# Patient Record
Sex: Male | Born: 1955 | Race: White | Hispanic: No | Marital: Married | Smoking: Current some day smoker
Health system: Southern US, Community
[De-identification: ages and names within clinical notes are randomized; demographics above are authoritative.]

## PROBLEM LIST (undated history)

## (undated) DIAGNOSIS — Z973 Presence of spectacles and contact lenses: Secondary | ICD-10-CM

## (undated) DIAGNOSIS — I671 Cerebral aneurysm, nonruptured: Secondary | ICD-10-CM

## (undated) DIAGNOSIS — H532 Diplopia: Secondary | ICD-10-CM

## (undated) DIAGNOSIS — J302 Other seasonal allergic rhinitis: Secondary | ICD-10-CM

## (undated) DIAGNOSIS — I639 Cerebral infarction, unspecified: Secondary | ICD-10-CM

## (undated) HISTORY — DX: Diplopia: H53.2

## (undated) HISTORY — PX: WISDOM TOOTH EXTRACTION: SHX21

## (undated) HISTORY — PX: COLONOSCOPY: SHX174

## (undated) HISTORY — PX: BACK SURGERY: SHX140

## (undated) HISTORY — DX: Cerebral infarction, unspecified: I63.9

---

## 2013-06-13 ENCOUNTER — Other Ambulatory Visit: Payer: Self-pay | Admitting: Family Medicine

## 2013-06-13 DIAGNOSIS — M545 Low back pain, unspecified: Secondary | ICD-10-CM

## 2013-06-17 ENCOUNTER — Ambulatory Visit
Admission: RE | Admit: 2013-06-17 | Discharge: 2013-06-17 | Disposition: A | Discharge status: Home / Self Care | Payer: BC Managed Care – PPO | Source: Ambulatory Visit | Attending: Family Medicine | Admitting: Family Medicine

## 2013-06-17 DIAGNOSIS — M545 Low back pain, unspecified: Secondary | ICD-10-CM

## 2015-10-09 DIAGNOSIS — F17210 Nicotine dependence, cigarettes, uncomplicated: Secondary | ICD-10-CM | POA: Insufficient documentation

## 2015-10-09 DIAGNOSIS — H9012 Conductive hearing loss, unilateral, left ear, with unrestricted hearing on the contralateral side: Secondary | ICD-10-CM | POA: Insufficient documentation

## 2015-10-09 DIAGNOSIS — H6123 Impacted cerumen, bilateral: Secondary | ICD-10-CM | POA: Insufficient documentation

## 2019-06-07 NOTE — Progress Notes (Signed)
NEUROLOGY CONSULTATION NOTE  Victor Pena MRN: [id number] DOB: 08-23-1955  Referring provider: Ashok Pena, OD Primary care provider: No PCP  Reason for consult:  diplopia  HISTORY OF PRESENT ILLNESS: Victor Pena is a 64 year old right-handed male who presents for intermittent diplopia.  History supplemented by referring provider note.  Since early February, he will have episodes where his left eye drifts temporally, lasting 1 to 2 seconds and occurring 3 to 4 times a day. He particularly notices it while looking in the mirror.  He notes some very mild blurred vision while driving as well but unable to check in the rearview mirror if it is occurring.  No eye pain or frank diplopia. He has history of occasional dull headaches but no recent change in symptom, intensity, or frequency.  He was evaluated by Dr. Laural Benes at [facility] on 06/03/2019.  Family history:  His father had a cerebral aneurysm  PAST MEDICAL HISTORY: Past Medical History:  Diagnosis Date  . Diplopia     PAST SURGICAL HISTORY: Past Surgical History:  Procedure Laterality Date  . BACK SURGERY      MEDICATIONS: No outpatient encounter medications on file as of 06/10/2019.   No facility-administered encounter medications on file as of 06/10/2019.    ALLERGIES: No Known Allergies  FAMILY HISTORY: Family History  Problem Relation Age of Onset  . Aneurysm Father     SOCIAL HISTORY: Social History   Socioeconomic History  . Marital status: Married    Spouse name: Not on file  . Number of children: Not on file  . Years of education: Not on file  . Highest education level: Not on file  Occupational History  . Not on file  Tobacco Use  . Smoking status: Not on file  Substance and Sexual Activity  . Alcohol use: Not on file  . Drug use: Not on file  . Sexual activity: Not on file  Other Topics Concern  . Not on file  Social History Narrative  . Not on file   Social Determinants of Health     Financial Resource Strain:   . Difficulty of Paying Living Expenses: Not on file  Food Insecurity:   . Worried About Programme researcher, broadcasting/film/video in the Last Year: Not on file  . Ran Out of Food in the Last Year: Not on file  Transportation Needs:   . Lack of Transportation (Medical): Not on file  . Lack of Transportation (Non-Medical): Not on file  Physical Activity:   . Days of Exercise per Week: Not on file  . Minutes of Exercise per Session: Not on file  Stress:   . Feeling of Stress : Not on file  Social Connections:   . Frequency of Communication with Friends and Family: Not on file  . Frequency of Social Gatherings with Friends and Family: Not on file  . Attends Religious Services: Not on file  . Active Member of Clubs or Organizations: Not on file  . Attends Banker Meetings: Not on file  . Marital Status: Not on file  Intimate Partner Violence:   . Fear of Current or Ex-Partner: Not on file  . Emotionally Abused: Not on file  . Physically Abused: Not on file  . Sexually Abused: Not on file    PHYSICAL EXAM: Blood pressure (!) 177/101, resp. rate 18, height 5\' 11"  (1.803 m), weight 192 lb (87.1 kg). General: Anxious but no acute distress.  Patient appears well-groomed.  Head:  Normocephalic/atraumatic Eyes:  fundi examined but not visualized Neck: supple, no paraspinal tenderness, full range of motion Back: No paraspinal tenderness Heart: regular rate and rhythm Lungs: Clear to auscultation bilaterally. Vascular: No carotid bruits. Neurological Exam: Mental status: alert and oriented to person, place, and time, recent and remote memory intact, fund of knowledge intact, attention and concentration intact, speech fluent and not dysarthric, language intact. Cranial nerves: CN I: not tested CN II: pupils equal, round and reactive to light, visual fields intact CN III, IV, VI:  full range of motion, no nystagmus, no ptosis CN V: facial sensation intact CN VII:  upper and lower face symmetric CN VIII: hearing intact CN IX, X: gag intact, uvula midline CN XI: sternocleidomastoid and trapezius muscles intact CN XII: tongue midline Bulk & Tone: normal, no fasciculations. Motor:  5/5 throughout.  Tremulous. Sensation:  Pinprick and vibration sensation intact. Deep Tendon Reflexes:  2+ throughout, toes downgoing.  Finger to nose testing:  Without dysmetria.  Heel to shin:  Without dysmetria.  Gait:  Normal station and stride.  Able to turn and tandem walk. Romberg negative.  IMPRESSION: 1.  Intermittent exotropia of left eye.  May simply be benign but since this is a new phenomena for the patient, further evaluation is warranted. 2.  Elevated blood pressure.  [name] tremulous as well.  Patient reports feeling nervous.   PLAN: 1.  TSH, thyroid antibodies, myasthenia gravis panel 2.  MRI and MRA of brain with and without contrast 3.  If blood pressure remains elevated, should see an internist. 4.  Further recommendations pending results.  Thank you for allowing me to take part in the care of this patient.  Metta Clines, DO  CC:  Victor Pena, OD

## 2019-06-10 ENCOUNTER — Other Ambulatory Visit: Payer: Self-pay

## 2019-06-10 ENCOUNTER — Ambulatory Visit (INDEPENDENT_AMBULATORY_CARE_PROVIDER_SITE_OTHER): Payer: 59 | Admitting: Neurology

## 2019-06-10 ENCOUNTER — Encounter: Payer: Self-pay | Admitting: Neurology

## 2019-06-10 VITALS — BP 177/101 | Resp 18 | Ht 71.0 in | Wt 192.0 lb

## 2019-06-10 DIAGNOSIS — R030 Elevated blood-pressure reading, without diagnosis of hypertension: Secondary | ICD-10-CM | POA: Diagnosis not present

## 2019-06-10 DIAGNOSIS — H50332 Intermittent monocular exotropia, left eye: Secondary | ICD-10-CM

## 2019-06-10 DIAGNOSIS — H5010 Unspecified exotropia: Secondary | ICD-10-CM | POA: Diagnosis not present

## 2019-06-10 NOTE — Patient Instructions (Addendum)
1.  Will check MRI and MRA of head 2.  Will check thyroid panel and myasthenia gravis panel 3.  Further recommendations pending results.  We have sent a referral to [facility] for your MRI and they will call you directly to schedule your appointment. They are located at [address] [facility]. If you need to contact them directly please call [phone].  Your provider has requested that you have labwork completed today. Please go to [facility] ([phone]) on the second floor of this building before leaving the office today. You do not need to check in. If you are not called within 15 minutes please check with the front desk.

## 2019-07-01 ENCOUNTER — Other Ambulatory Visit (INDEPENDENT_AMBULATORY_CARE_PROVIDER_SITE_OTHER): Payer: Managed Care, Other (non HMO)

## 2019-07-01 ENCOUNTER — Other Ambulatory Visit: Payer: Self-pay

## 2019-07-01 DIAGNOSIS — H5010 Unspecified exotropia: Secondary | ICD-10-CM | POA: Diagnosis not present

## 2019-07-01 DIAGNOSIS — H50332 Intermittent monocular exotropia, left eye: Secondary | ICD-10-CM

## 2019-07-02 ENCOUNTER — Telehealth: Payer: Self-pay

## 2019-07-02 LAB — TSH: TSH: 2.14 mIU/L (ref 0.40–4.50)

## 2019-07-02 MED ORDER — DIAZEPAM 5 MG PO TABS
ORAL_TABLET | ORAL | 0 refills | Status: DC
Start: 2019-07-02 — End: 2019-08-20

## 2019-07-02 NOTE — Telephone Encounter (Signed)
Signed.

## 2019-07-02 NOTE — Telephone Encounter (Signed)
-----   Message from Van Clines, MD sent at 07/02/2019  8:34 AM EDT ----- Pls let him know thyroid level normal, thanks

## 2019-07-02 NOTE — Telephone Encounter (Signed)
Ok to give Valium 5mg  x 1 dose 30 mins prior to MRI, may take second dose if needed. Dispense #2 with no refills. He will need to have a driver. Thanks

## 2019-07-02 NOTE — Telephone Encounter (Signed)
Please sign rx

## 2019-07-02 NOTE — Telephone Encounter (Signed)
Patient informed of thyroid results.  Patient is scheduled for MRI and is concerned about feeling claustrophobic and is requesting a valium.

## 2019-07-04 ENCOUNTER — Ambulatory Visit: Payer: Managed Care, Other (non HMO) | Attending: Internal Medicine

## 2019-07-04 DIAGNOSIS — Z23 Encounter for immunization: Secondary | ICD-10-CM

## 2019-07-04 NOTE — Progress Notes (Signed)
   Covid-19 Vaccination Clinic  Name:  NOBLE CICALESE    MRN: [id number] DOB: [birth year]  07/04/2019  Mr. Tenbrink was observed post Covid-19 immunization for 15 minutes without incident. He was provided with Vaccine Information Sheet and instruction to access the V-Safe system.   Mr. Hanway was instructed to call 911 with any severe reactions post vaccine: Marland Kitchen Difficulty breathing  . Swelling of face and throat  . A fast heartbeat  . A bad rash all over body  . Dizziness and weakness   Immunizations Administered    Name Date Dose VIS Date Route   Pfizer COVID-19 Vaccine 07/04/2019  4:55 PM 0.3 mL 03/15/2019 Intramuscular   Manufacturer: [employer], [employer]   Lot: [id number]   NDC: [address]-4

## 2019-07-05 LAB — STRIATED MUSCLE ANTIBODY: STRIATED MUSCLE AB SCREEN: NEGATIVE

## 2019-07-05 LAB — ACETYLCHOLINE RECEPTOR, BINDING: A CHR BINDING ABS: <0.3 nmol/L

## 2019-07-08 ENCOUNTER — Telehealth: Payer: Self-pay

## 2019-07-08 NOTE — Telephone Encounter (Signed)
-----   Message from Adam R Jaffe, DO sent at 07/07/2019  4:47 PM EDT ----- Labs are normal 

## 2019-07-08 NOTE — Telephone Encounter (Signed)
Tried calling patient with lab results no answer unable to LVM.

## 2019-07-09 NOTE — Telephone Encounter (Signed)
-----   Message from Drema Dallas, DO sent at 07/07/2019  4:47 PM EDT ----- Labs are normal

## 2019-07-09 NOTE — Telephone Encounter (Signed)
Patient aware of results.

## 2019-07-10 ENCOUNTER — Ambulatory Visit
Admission: RE | Admit: 2019-07-10 | Discharge: 2019-07-10 | Disposition: A | Discharge status: Home / Self Care | Payer: Managed Care, Other (non HMO) | Source: Ambulatory Visit | Attending: Neurology | Admitting: Neurology

## 2019-07-10 ENCOUNTER — Other Ambulatory Visit: Payer: Self-pay

## 2019-07-10 ENCOUNTER — Telehealth: Payer: Self-pay | Admitting: Neurology

## 2019-07-10 DIAGNOSIS — H5010 Unspecified exotropia: Secondary | ICD-10-CM

## 2019-07-10 DIAGNOSIS — H50332 Intermittent monocular exotropia, left eye: Secondary | ICD-10-CM

## 2019-07-10 NOTE — Telephone Encounter (Signed)
Patient called and said he was returning a call to Dr. Everlena Cooper about results from an MRI today.

## 2019-07-10 NOTE — Telephone Encounter (Signed)
I called and spoke to Victor Pena.  I explained the MRI and MRA results.  The plan going forward:  Referral to Dr. Baldemar Lenis of interventional neuroradiology for cerebral angiogram regarding cerebral aneurysms Referral to Dr. Wynell Balloon at [facility] for intermittent exotropia of left eye.

## 2019-07-11 ENCOUNTER — Other Ambulatory Visit: Payer: Self-pay

## 2019-07-11 DIAGNOSIS — H5010 Unspecified exotropia: Secondary | ICD-10-CM

## 2019-07-11 DIAGNOSIS — H50332 Intermittent monocular exotropia, left eye: Secondary | ICD-10-CM

## 2019-07-11 DIAGNOSIS — I671 Cerebral aneurysm, nonruptured: Secondary | ICD-10-CM

## 2019-07-15 ENCOUNTER — Ambulatory Visit (HOSPITAL_COMMUNITY)
Admission: RE | Admit: 2019-07-15 | Discharge: 2019-07-15 | Disposition: A | Discharge status: Home / Self Care | Payer: Managed Care, Other (non HMO) | Source: Ambulatory Visit | Attending: Neurology | Admitting: Neurology

## 2019-07-15 ENCOUNTER — Other Ambulatory Visit: Payer: Self-pay

## 2019-07-15 ENCOUNTER — Other Ambulatory Visit (HOSPITAL_COMMUNITY): Payer: Self-pay | Admitting: Neuroradiology

## 2019-07-15 DIAGNOSIS — H50112 Monocular exotropia, left eye: Secondary | ICD-10-CM | POA: Diagnosis not present

## 2019-07-15 DIAGNOSIS — I671 Cerebral aneurysm, nonruptured: Secondary | ICD-10-CM

## 2019-07-15 DIAGNOSIS — F17210 Nicotine dependence, cigarettes, uncomplicated: Secondary | ICD-10-CM | POA: Diagnosis not present

## 2019-07-15 LAB — CBC WITH DIFFERENTIAL/PLATELET
Abs Immature Granulocytes: 0.02 10*3/uL (ref 0.00–0.07)
Basophils Absolute: 0 10*3/uL (ref 0.0–0.1)
Basophils Relative: 0 %
Eosinophils Absolute: 0.1 10*3/uL (ref 0.0–0.5)
Eosinophils Relative: 1 %
HCT: 43.9 % (ref 39.0–52.0)
Hemoglobin: 14.7 g/dL (ref 13.0–17.0)
Immature Granulocytes: 0 %
Lymphocytes Relative: 15 %
Lymphs Abs: 1.3 10*3/uL (ref 0.7–4.0)
MCH: 35.5 pg — ABNORMAL HIGH (ref 26.0–34.0)
MCHC: 33.5 g/dL (ref 30.0–36.0)
MCV: 106 fL — ABNORMAL HIGH (ref 80.0–100.0)
Monocytes Absolute: 1.1 10*3/uL — ABNORMAL HIGH (ref 0.1–1.0)
Monocytes Relative: 12 %
Neutro Abs: 6.4 10*3/uL (ref 1.7–7.7)
Neutrophils Relative %: 72 %
Platelets: 232 10*3/uL (ref 150–400)
RBC: 4.14 MIL/uL — ABNORMAL LOW (ref 4.22–5.81)
RDW: 13.6 % (ref 11.5–15.5)
WBC: 8.9 10*3/uL (ref 4.0–10.5)
nRBC: 0 % (ref 0.0–0.2)

## 2019-07-15 LAB — BASIC METABOLIC PANEL
Anion gap: 12 (ref 5–15)
BUN: 17 mg/dL (ref 8–23)
CO2: 26 mmol/L (ref 22–32)
Calcium: 9.9 mg/dL (ref 8.9–10.3)
Chloride: 102 mmol/L (ref 98–111)
Creatinine, Ser: 0.93 mg/dL (ref 0.61–1.24)
GFR calc Af Amer: >60 mL/min (ref >60)
GFR calc non Af Amer: >60 mL/min (ref >60)
Glucose, Bld: 107 mg/dL — ABNORMAL HIGH (ref 70–99)
Potassium: 5.2 mmol/L — ABNORMAL HIGH (ref 3.5–5.1)
Sodium: 140 mmol/L (ref 135–145)

## 2019-07-15 LAB — PROTIME-INR
INR: 1 (ref 0.8–1.2)
Prothrombin Time: 12.9 seconds (ref 11.4–15.2)

## 2019-07-15 LAB — APTT: aPTT: 29 seconds (ref 24–36)

## 2019-07-15 NOTE — H&P (Signed)
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  Referring Physician(s): Jaffe,Adam R  Supervising Physician: Dr. Baldemar Lenis, MD  Patient Status: [facility] - Out-pt  History of Present Illness: Victor Pena is a 64 y.o. male with a past medical history of chronic back pain and intermittent exotropia of left eye. 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  Additionally, mild flow related enhancement was seen in the right cavernous sinus concerning for d-AV fistula versus artifact. He endorses occasional headaches relieved by OTC ibuprofen and mild chronic fatigue. He smokes 1 pack/day/40 years and has a family history significant for father death secondary to ruptured brain aneurysm.   Past Medical History:  Diagnosis Date  . Diplopia     Past Surgical History:  Procedure Laterality Date  . BACK SURGERY      Allergies: Bee pollen  Medications: Prior to Admission medications   Medication Sig Start Date End Date Taking? Authorizing Provider  diazepam (VALIUM) 5 MG tablet Take 1 tablet 30 mins prior to MRI. May take second dose if needed. 07/02/19   Van Clines, MD     Family History  Problem Relation Age of Onset  . Aneurysm Father     Social History   Socioeconomic History  . Marital status: Married    Spouse name: Not on file  . Number of children: 4  . Years of education: 75  . Highest education level: Not on file  Occupational History  . Occupation: owner   Tobacco Use  . Smoking status: Current Every Day Smoker  . Smokeless tobacco: Current User  Substance and Sexual Activity  . Alcohol use: Yes  . Drug use: Not on file  . Sexual activity: Not on file  Other Topics Concern  . Not on file  Social History  Narrative   Right handed   3 story home   Drinks caffeine   Social Determinants of Health   Financial Resource Strain:   . Difficulty of Paying Living Expenses:   Food Insecurity:   . Worried About Programme researcher, broadcasting/film/video in the Last Year:   . Barista in the Last Year:   Transportation Needs:   . Freight forwarder (Medical):   Marland Kitchen Lack of Transportation (Non-Medical):   Physical Activity:   . Days of Exercise per Week:   . Minutes of Exercise per Session:   Stress:   . Feeling of Stress :   Social Connections:   . Frequency of Communication with Friends and Family:   . Frequency of Social Gatherings with Friends and Family:   . Attends Religious Services:   . Active Member of Clubs or Organizations:   . Attends Banker Meetings:   Marland Kitchen Marital Status:      Review of Systems: A 12 point ROS discussed and pertinent positives are indicated in the HPI above.  All other systems are negative.  Review of Systems  Skin: Positive for rash.    Vital Signs: There were no vitals taken for this visit.  Physical Exam Constitutional:      Appearance: Normal appearance.  HENT:     Head: Normocephalic and atraumatic.     Mouth/Throat:     Mouth: Mucous membranes are moist.  Eyes:  Pupils: Pupils are equal, round, and reactive to light.  Cardiovascular:     Rate and Rhythm: Normal rate and regular rhythm.     Pulses: Normal pulses.     Heart sounds: Normal heart sounds.  Pulmonary:     Effort: Pulmonary effort is normal.     Breath sounds: Normal breath sounds.  Skin:    General: Skin is warm.     Capillary Refill: Capillary refill takes less than 2 seconds.     Findings: Rash present. Rash is urticarial.          Comments: Diffuse rash  Neurological:     General: No focal deficit present.     Mental Status: [name] alert and oriented to person, place, and time.     Cranial Nerves: Cranial nerves are intact.     Motor: Motor function is intact.           Imaging: MR ANGIO HEAD WO CONTRAST  Result Date: 07/10/2019 CLINICAL DATA:  Intermittent in exotropia of left eye. EXAM: MRI HEAD WITHOUT CONTRAST MRA HEAD WITHOUT CONTRAST TECHNIQUE: Multiplanar, multiecho pulse sequences of the brain and surrounding structures were obtained without intravenous contrast. Angiographic images of the head were obtained using MRA technique without contrast. COMPARISON:  None. FINDINGS: MRI HEAD FINDINGS Brain: No acute infarction, hemorrhage, hydrocephalus, extra-axial collection or mass lesion. Vascular: Normal flow voids. See detailed MRA report below. Skull and upper cervical spine: Normal marrow signal. Sinuses/Orbits: Minimal mucosal thickening of the ethmoid cells. The orbits are maintained. MRA HEAD FINDINGS Visualized upper cervical internal carotid arteries have normal flow related enhancement. A 3 mm outpouching projecting medially from the paraclinoid right ICA concerning for possible superior hypophyseal artery aneurysm versus infundibulum. There is also an outpouching from the undersurface of the supraclinoid right ICA measuring 3 x 1.9 mm. A 2 mm outpouching from the supraclinoid left ICA may represent an infundibulum at the origin of the left posterior communicating artery versus a small aneurysm. Flow related enhancement is noted in the right cavernous sinus question artifact versus carotid cavernous sinus fistula. 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 The right MCA and the bilateral ACA vascular trees have normal caliber and flow related enhancement without evidence of aneurysm. The intracranial vertebral arteries, basilar artery and bilateral posterior cerebral arteries have normal course and caliber with normal flow related enhancement, without evidence of aneurysm. IMPRESSION: 1. No acute intracranial abnormality. 2. 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 3. A 3 mm outpouching medially projecting from the  paraclinoid right ICA may represent a superior hypophyseal artery aneurysm versus infundibulum. A 3 mm outpouching from the undersurface of the supraclinoid right ICA concerning for possible paraclinoid aneurysm. 4. A 2 mm outpouching from the undersurface of the supraclinoid left ICA may represent an infundibulum at the origin of the left posterior communicating artery versus a small aneurysm. 5. Flow related enhancement in the right cavernous sinus question artifact versus carotid cavernous sinus fistula. These results were called by telephone at the time of interpretation on 07/10/2019 at 11:31 am to provider ADAM JAFFE , who verbally acknowledged these results. Electronically Signed   By: Pedro Earls M.D.   On: 07/10/2019 11:31   MR BRAIN WO CONTRAST  Result Date: 07/10/2019 CLINICAL DATA:  Intermittent in exotropia of left eye. EXAM: MRI HEAD WITHOUT CONTRAST MRA HEAD WITHOUT CONTRAST TECHNIQUE: Multiplanar, multiecho pulse sequences of the brain and surrounding structures were obtained without  intravenous contrast. Angiographic images of the head were obtained using MRA technique without contrast. COMPARISON:  None. FINDINGS: MRI HEAD FINDINGS Brain: No acute infarction, hemorrhage, hydrocephalus, extra-axial collection or mass lesion. Vascular: Normal flow voids. See detailed MRA report below. Skull and upper cervical spine: Normal marrow signal. Sinuses/Orbits: Minimal mucosal thickening of the ethmoid cells. The orbits are maintained. MRA HEAD FINDINGS Visualized upper cervical internal carotid arteries have normal flow related enhancement. A 3 mm outpouching projecting medially from the paraclinoid right ICA concerning for possible superior hypophyseal artery aneurysm versus infundibulum. There is also an outpouching from the undersurface of the supraclinoid right ICA measuring 3 x 1.9 mm. A 2 mm outpouching from the supraclinoid left ICA may represent an infundibulum at the origin of  the left posterior communicating artery versus a small aneurysm. Flow related enhancement is noted in the right cavernous sinus question artifact versus carotid cavernous sinus fistula. 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 The right MCA and the bilateral ACA vascular trees have normal caliber and flow related enhancement without evidence of aneurysm. The intracranial vertebral arteries, basilar artery and bilateral posterior cerebral arteries have normal course and caliber with normal flow related enhancement, without evidence of aneurysm. IMPRESSION: 1. No acute intracranial abnormality. 2. 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 3. A 3 mm outpouching medially projecting from the paraclinoid right ICA may represent a superior hypophyseal artery aneurysm versus infundibulum. A 3 mm outpouching from the undersurface of the supraclinoid right ICA concerning for possible paraclinoid aneurysm. 4. A 2 mm outpouching from the undersurface of the supraclinoid left ICA may represent an infundibulum at the origin of the left posterior communicating artery versus a small aneurysm. 5. Flow related enhancement in the right cavernous sinus question artifact versus carotid cavernous sinus fistula. These results were called by telephone at the time of interpretation on 07/10/2019 at 11:31 am to provider ADAM JAFFE , who verbally acknowledged these results. Electronically Signed   By: Baldemar Lenis M.D.   On: 07/10/2019 11:31    Labs:  CBC: Recent Labs    07/15/19 1344  WBC 8.9  HGB 14.7  HCT 43.9  PLT 232    COAGS: No results for input(s): INR, APTT in the last 8760 hours.  BMP: No results for input(s): NA, K, CL, CO2, GLUCOSE, BUN, CALCIUM, CREATININE, GFRNONAA, GFRAA in the last 8760 hours.  Invalid input(s): CMP  LIVER FUNCTION TESTS: No results for input(s): BILITOT, AST, ALT, ALKPHOS, PROT, ALBUMIN in the last 8760 hours.  TUMOR MARKERS: No  results for input(s): AFPTM, CEA, CA199, CHROMGRNA in the last 8760 hours.  Assessment and Plan: Mr. Kitchings is a 64 year old male with recently diagnosed incidental brain aneurysms. He and his wife are anxious about the diagnosis given his family history of ruptured brain aneurysm (father). We discussed the diagnosis and his MRI results. We explained the natural history of aneurysms and the signs/recognition of aneurysm rupture and the need to call 911 should this happen. We proposed obtaining a diagnostic cerebral angiogram to better evaluate MRI findings and for treatment planing. Procedure risks were discussed. All questions were answered to his satisfaction. Additionally, he was encouraged to seek evaluation and care for his bilateral UUEE and LLEE rash.          Thank you for this interesting consult.  I greatly enjoyed meeting Victor Pena and look forward to participating in his care.  A copy of  this report was sent to the requesting provider on this date.  Electronically Signed: Baldemar Lenis, MD 07/15/2019, 2:00 PM   I spent a total of  60 Minutes   in face to face in clinical consultation, greater than 50% of which was counseling/coordinating care for incidental brain aneurysms.

## 2019-07-17 ENCOUNTER — Other Ambulatory Visit: Payer: Self-pay | Admitting: Radiology

## 2019-07-18 ENCOUNTER — Other Ambulatory Visit: Payer: Self-pay | Admitting: Radiology

## 2019-07-18 ENCOUNTER — Telehealth: Payer: Self-pay | Admitting: Sports Medicine

## 2019-07-18 NOTE — Telephone Encounter (Signed)
This is a pleasant 64 year old male, he had some headaches, and visual changes and has asked me to review his MRI.  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  Dye angiogram coming up.

## 2019-07-19 ENCOUNTER — Ambulatory Visit (HOSPITAL_COMMUNITY)
Admission: RE | Admit: 2019-07-19 | Discharge: 2019-07-19 | Disposition: A | Discharge status: Home / Self Care | Payer: Managed Care, Other (non HMO) | Source: Ambulatory Visit | Attending: Neuroradiology | Admitting: Neuroradiology

## 2019-07-19 ENCOUNTER — Other Ambulatory Visit: Payer: Self-pay

## 2019-07-19 ENCOUNTER — Other Ambulatory Visit (HOSPITAL_COMMUNITY): Payer: Self-pay | Admitting: Neuroradiology

## 2019-07-19 DIAGNOSIS — I671 Cerebral aneurysm, nonruptured: Secondary | ICD-10-CM | POA: Diagnosis not present

## 2019-07-19 DIAGNOSIS — H50112 Monocular exotropia, left eye: Secondary | ICD-10-CM | POA: Insufficient documentation

## 2019-07-19 DIAGNOSIS — F17200 Nicotine dependence, unspecified, uncomplicated: Secondary | ICD-10-CM | POA: Diagnosis not present

## 2019-07-19 HISTORY — PX: IR 3D INDEPENDENT WKST: IMG2385

## 2019-07-19 HISTORY — PX: IR ANGIO VERTEBRAL SEL VERTEBRAL BILAT MOD SED: IMG5369

## 2019-07-19 HISTORY — PX: IR ANGIO INTRA EXTRACRAN SEL INTERNAL CAROTID BILAT MOD SED: IMG5363

## 2019-07-19 HISTORY — PX: IR US GUIDE VASC ACCESS RIGHT: IMG2390

## 2019-07-19 HISTORY — PX: IR ANGIO EXTERNAL CAROTID SEL EXT CAROTID BILAT MOD SED: IMG5372

## 2019-07-19 LAB — CBC WITH DIFFERENTIAL/PLATELET
Abs Immature Granulocytes: 0.03 10*3/uL (ref 0.00–0.07)
Basophils Absolute: 0.1 10*3/uL (ref 0.0–0.1)
Basophils Relative: 1 %
Eosinophils Absolute: 0.2 10*3/uL (ref 0.0–0.5)
Eosinophils Relative: 4 %
HCT: 44 % (ref 39.0–52.0)
Hemoglobin: 14.9 g/dL (ref 13.0–17.0)
Immature Granulocytes: 1 %
Lymphocytes Relative: 22 %
Lymphs Abs: 1.4 10*3/uL (ref 0.7–4.0)
MCH: 35.6 pg — ABNORMAL HIGH (ref 26.0–34.0)
MCHC: 33.9 g/dL (ref 30.0–36.0)
MCV: 105 fL — ABNORMAL HIGH (ref 80.0–100.0)
Monocytes Absolute: 1 10*3/uL (ref 0.1–1.0)
Monocytes Relative: 16 %
Neutro Abs: 3.8 10*3/uL (ref 1.7–7.7)
Neutrophils Relative %: 56 %
Platelets: 257 10*3/uL (ref 150–400)
RBC: 4.19 MIL/uL — ABNORMAL LOW (ref 4.22–5.81)
RDW: 13.7 % (ref 11.5–15.5)
WBC: 6.6 10*3/uL (ref 4.0–10.5)
nRBC: 0 % (ref 0.0–0.2)

## 2019-07-19 LAB — BASIC METABOLIC PANEL
Anion gap: 16 — ABNORMAL HIGH (ref 5–15)
BUN: 14 mg/dL (ref 8–23)
CO2: 22 mmol/L (ref 22–32)
Calcium: 9.7 mg/dL (ref 8.9–10.3)
Chloride: 104 mmol/L (ref 98–111)
Creatinine, Ser: 0.91 mg/dL (ref 0.61–1.24)
GFR calc Af Amer: >60 mL/min (ref >60)
GFR calc non Af Amer: >60 mL/min (ref >60)
Glucose, Bld: 97 mg/dL (ref 70–99)
Potassium: 4.4 mmol/L (ref 3.5–5.1)
Sodium: 142 mmol/L (ref 135–145)

## 2019-07-19 MED ORDER — LIDOCAINE HCL 1 % IJ SOLN
INTRAMUSCULAR | Status: AC
  Filled 2019-07-19: qty 20

## 2019-07-19 MED ORDER — NITROGLYCERIN 1 MG/10 ML FOR IR/CATH LAB
INTRA_ARTERIAL | Status: AC
  Filled 2019-07-19: qty 10

## 2019-07-19 MED ORDER — NITROGLYCERIN 1 MG/10 ML FOR IR/CATH LAB
INTRA_ARTERIAL | Status: AC | PRN
  Administered 2019-07-19: 200 ug via INTRA_ARTERIAL

## 2019-07-19 MED ORDER — HEPARIN SODIUM (PORCINE) 1000 UNIT/ML IJ SOLN
INTRAMUSCULAR | Status: AC | PRN
  Administered 2019-07-19: 5000 [IU] via INTRA_ARTERIAL

## 2019-07-19 MED ORDER — IOHEXOL 240 MG/ML SOLN
INTRAMUSCULAR | Status: AC
  Filled 2019-07-19: qty 100

## 2019-07-19 MED ORDER — IOHEXOL 240 MG/ML SOLN
150.0000 mL | Freq: Once | INTRAMUSCULAR | Status: AC | PRN
  Administered 2019-07-19: 55 mL via INTRA_ARTERIAL

## 2019-07-19 MED ORDER — LIDOCAINE HCL 1 % IJ SOLN
INTRAMUSCULAR | Status: AC | PRN
  Administered 2019-07-19 (×2): 1 mL

## 2019-07-19 MED ORDER — FENTANYL CITRATE (PF) 100 MCG/2ML IJ SOLN
INTRAMUSCULAR | Status: AC
  Filled 2019-07-19: qty 2

## 2019-07-19 MED ORDER — FENTANYL CITRATE (PF) 100 MCG/2ML IJ SOLN
INTRAMUSCULAR | Status: AC | PRN
  Administered 2019-07-19: 50 ug via INTRAVENOUS

## 2019-07-19 MED ORDER — MIDAZOLAM HCL 2 MG/2ML IJ SOLN
INTRAMUSCULAR | Status: AC
  Filled 2019-07-19: qty 2

## 2019-07-19 MED ORDER — SODIUM CHLORIDE 0.9 % IV SOLN: INTRAVENOUS | Status: DC

## 2019-07-19 MED ORDER — MIDAZOLAM HCL 2 MG/2ML IJ SOLN
INTRAMUSCULAR | Status: AC | PRN
  Administered 2019-07-19: 1 mg via INTRAVENOUS

## 2019-07-19 MED ORDER — HEPARIN SODIUM (PORCINE) 1000 UNIT/ML IJ SOLN
INTRAMUSCULAR | Status: AC
  Filled 2019-07-19: qty 1

## 2019-07-19 NOTE — H&P (Signed)
Chief Complaint: Patient was seen in consultation today for brain aneurysms/diagnostic cerebral arteriogram.  Referring Physician(s): Jaffe,Adam R  Supervising Physician: Pedro Earls  Patient Status: [facility] - Out-pt  History of Present Illness: Victor Pena is a 64 y.o. male with a past medical history of chronic back pain and intermittent exotropia of left eye. [name] known to [facility] and has been followed by Dr. Karenann Cai since 07/2019. He first presented to our department at the request of Dr. Tomi Likens for management of incidental findings of multiple brain aneurysms (left MCA bifurcation, left ICA ophthalmic, and bilateral ICA paraclinoid aneurysms), seen on MRI/MRA brain/head 07/10/2019 during work-up for intermittent left eye exotropia. He consulted with Dr. Karenann Cai 07/15/2019 on an outpatient basis to discuss management options. At that time, patient decided to pursue an image-guided diagnostic cerebral arteriogram to accurately evaluate aneurysms and plan possible embolization treatment.  MRI/MRA brain/head 07/10/2019: 1. No acute intracranial abnormality. 2. 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 3. A 3 mm outpouching medially projecting from the paraclinoid right ICA may represent a superior hypophyseal artery aneurysm versus infundibulum. A 3 mm outpouching from the undersurface of the supraclinoid right ICA concerning for possible paraclinoid aneurysm. 4. A 2 mm outpouching from the undersurface of the supraclinoid left ICA may represent an infundibulum at the origin of the left posterior communicating artery versus a small aneurysm. 5. Flow related enhancement in the right cavernous sinus question artifact versus carotid cavernous sinus fistula.  Patient presents today for possible image-guided diagnostic cerebral arteriogram. Patient awake and alert laying in bed with no complaints at this time. Denies fever, chills, chest pain,  dyspnea, abdominal pain, or headache.   Past Medical History:  Diagnosis Date  . Diplopia     Past Surgical History:  Procedure Laterality Date  . BACK SURGERY      Allergies: Bee pollen  Medications: Prior to Admission medications   Medication Sig Start Date End Date Taking? Authorizing Provider  ibuprofen (ADVIL) 200 MG tablet Take 400 mg by mouth every 6 (six) hours as needed for moderate pain.   Yes [provider]  diazepam (VALIUM) 5 MG tablet Take 1 tablet 30 mins prior to MRI. May take second dose if needed. Patient not taking: Reported on 07/17/2019 07/02/19   [name], MD     Family History  Problem Relation Age of Onset  . Aneurysm Father     Social History   Socioeconomic History  . Marital status: Married    Spouse name: Not on file  . Number of children: 4  . Years of education: 43  . Highest education level: Not on file  Occupational History  . Occupation: owner   Tobacco Use  . Smoking status: Current Every Day Smoker  . Smokeless tobacco: Current User  Substance and Sexual Activity  . Alcohol use: Yes  . Drug use: Not on file  . Sexual activity: Not on file  Other Topics Concern  . Not on file  Social History Narrative   Right handed   3 story home   Drinks caffeine   Social Determinants of Health   Financial Resource Strain:   . Difficulty of Paying Living Expenses:   Food Insecurity:   . Worried About Charity fundraiser in the Last Year:   . Arboriculturist in the Last Year:   Transportation Needs:   . Film/video editor (Medical):   Marland Kitchen  Lack of Transportation (Non-Medical):   Physical Activity:   . Days of Exercise per Week:   . Minutes of Exercise per Session:   Stress:   . Feeling of Stress :   Social Connections:   . Frequency of Communication with Friends and Family:   . Frequency of Social Gatherings with Friends and Family:   . Attends Religious Services:   . Active Member of Clubs or Organizations:     . Attends Banker Meetings:   Marland Kitchen Marital Status:      Review of Systems: A 12 point ROS discussed and pertinent positives are indicated in the HPI above.  All other systems are negative.  Review of Systems  Constitutional: Negative for chills and fever.  Respiratory: Negative for shortness of breath and wheezing.   Cardiovascular: Negative for chest pain and palpitations.  Gastrointestinal: Negative for abdominal pain.  Neurological: Negative for headaches.  Psychiatric/Behavioral: Negative for behavioral problems and confusion.    Vital Signs: BP (!) 147/92   Pulse 79   Temp 98.4 F (36.9 C) (Skin)   Resp 17   Ht 5\' 11"  (1.803 m)   Wt 190 lb (86.2 kg)   SpO2 99%   BMI 26.50 kg/m   Physical Exam Vitals and nursing note reviewed.  Constitutional:      General: [name] not in acute distress.    Appearance: Normal appearance.  Cardiovascular:     Rate and Rhythm: Normal rate and regular rhythm.     Heart sounds: Normal heart sounds. No murmur.  Pulmonary:     Effort: Pulmonary effort is normal. No respiratory distress.     Breath sounds: Normal breath sounds. No wheezing.  Skin:    General: Skin is warm and dry.  Neurological:     Mental Status: [name] alert and oriented to person, place, and time.  Psychiatric:        Mood and Affect: Mood normal.        Behavior: Behavior normal.      MD Evaluation Airway: WNL Heart: WNL Abdomen: WNL Chest/ Lungs: WNL ASA  Classification: 2 Mallampati/Airway Score: Two   Imaging: MR ANGIO HEAD WO CONTRAST  Result Date: 07/10/2019 CLINICAL DATA:  Intermittent in exotropia of left eye. EXAM: MRI HEAD WITHOUT CONTRAST MRA HEAD WITHOUT CONTRAST TECHNIQUE: Multiplanar, multiecho pulse sequences of the brain and surrounding structures were obtained without intravenous contrast. Angiographic images of the head were obtained using MRA technique without contrast. COMPARISON:  None. FINDINGS: MRI HEAD FINDINGS Brain: No  acute infarction, hemorrhage, hydrocephalus, extra-axial collection or mass lesion. Vascular: Normal flow voids. See detailed MRA report below. Skull and upper cervical spine: Normal marrow signal. Sinuses/Orbits: Minimal mucosal thickening of the ethmoid cells. The orbits are maintained. MRA HEAD FINDINGS Visualized upper cervical internal carotid arteries have normal flow related enhancement. A 3 mm outpouching projecting medially from the paraclinoid right ICA concerning for possible superior hypophyseal artery aneurysm versus infundibulum. There is also an outpouching from the undersurface of the supraclinoid right ICA measuring 3 x 1.9 mm. A 2 mm outpouching from the supraclinoid left ICA may represent an infundibulum at the origin of the left posterior communicating artery versus a small aneurysm. Flow related enhancement is noted in the right cavernous sinus question artifact versus carotid cavernous sinus fistula. 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 The right MCA and the bilateral ACA vascular trees have normal caliber and flow related enhancement without evidence of aneurysm.  The intracranial vertebral arteries, basilar artery and bilateral posterior cerebral arteries have normal course and caliber with normal flow related enhancement, without evidence of aneurysm. IMPRESSION: 1. No acute intracranial abnormality. 2. 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 3. A 3 mm outpouching medially projecting from the paraclinoid right ICA may represent a superior hypophyseal artery aneurysm versus infundibulum. A 3 mm outpouching from the undersurface of the supraclinoid right ICA concerning for possible paraclinoid aneurysm. 4. A 2 mm outpouching from the undersurface of the supraclinoid left ICA may represent an infundibulum at the origin of the left posterior communicating artery versus a small aneurysm. 5. Flow related enhancement in the right cavernous sinus question  artifact versus carotid cavernous sinus fistula. These results were called by telephone at the time of interpretation on 07/10/2019 at 11:31 am to provider ADAM JAFFE , who verbally acknowledged these results. Electronically Signed   By: Baldemar Lenis M.D.   On: 07/10/2019 11:31   MR BRAIN WO CONTRAST  Result Date: 07/10/2019 CLINICAL DATA:  Intermittent in exotropia of left eye. EXAM: MRI HEAD WITHOUT CONTRAST MRA HEAD WITHOUT CONTRAST TECHNIQUE: Multiplanar, multiecho pulse sequences of the brain and surrounding structures were obtained without intravenous contrast. Angiographic images of the head were obtained using MRA technique without contrast. COMPARISON:  None. FINDINGS: MRI HEAD FINDINGS Brain: No acute infarction, hemorrhage, hydrocephalus, extra-axial collection or mass lesion. Vascular: Normal flow voids. See detailed MRA report below. Skull and upper cervical spine: Normal marrow signal. Sinuses/Orbits: Minimal mucosal thickening of the ethmoid cells. The orbits are maintained. MRA HEAD FINDINGS Visualized upper cervical internal carotid arteries have normal flow related enhancement. A 3 mm outpouching projecting medially from the paraclinoid right ICA concerning for possible superior hypophyseal artery aneurysm versus infundibulum. There is also an outpouching from the undersurface of the supraclinoid right ICA measuring 3 x 1.9 mm. A 2 mm outpouching from the supraclinoid left ICA may represent an infundibulum at the origin of the left posterior communicating artery versus a small aneurysm. Flow related enhancement is noted in the right cavernous sinus question artifact versus carotid cavernous sinus fistula. 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 The right MCA and the bilateral ACA vascular trees have normal caliber and flow related enhancement without evidence of aneurysm. The intracranial vertebral arteries, basilar artery and bilateral posterior cerebral  arteries have normal course and caliber with normal flow related enhancement, without evidence of aneurysm. IMPRESSION: 1. No acute intracranial abnormality. 2. 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 3. A 3 mm outpouching medially projecting from the paraclinoid right ICA may represent a superior hypophyseal artery aneurysm versus infundibulum. A 3 mm outpouching from the undersurface of the supraclinoid right ICA concerning for possible paraclinoid aneurysm. 4. A 2 mm outpouching from the undersurface of the supraclinoid left ICA may represent an infundibulum at the origin of the left posterior communicating artery versus a small aneurysm. 5. Flow related enhancement in the right cavernous sinus question artifact versus carotid cavernous sinus fistula. These results were called by telephone at the time of interpretation on 07/10/2019 at 11:31 am to provider ADAM JAFFE , who verbally acknowledged these results. Electronically Signed   By: Baldemar Lenis M.D.   On: 07/10/2019 11:31    Labs:  CBC: Recent Labs    07/15/19 1344 07/19/19 0909  WBC 8.9 6.6  HGB 14.7 14.9  HCT 43.9 44.0  PLT 232 257  COAGS: Recent Labs    07/15/19 1344  INR 1.0  APTT 29    BMP: Recent Labs    07/15/19 1344  NA 140  K 5.2*  CL 102  CO2 26  GLUCOSE 107*  BUN 17  CALCIUM 9.9  CREATININE 0.93  GFRNONAA >60  GFRAA >60     Assessment and Plan:  Brain aneurysms (left MCA bifurcation, left ICA ophthalmic, and bilateral ICA paraclinoid aneurysms), seen on MRI/MRA brain/head 07/10/2019. Plan for image-guided diagnostic cerebral arteriogram today with Dr. Tommie Sams. Patient is NPO. Afebrile and WBCs WNL. He does not take blood thinners. INR 1.0 07/15/2019.  Risks and benefits of diagnostic cerebral arteriogram were discussed with the patient including, but not limited to bleeding, infection, vascular injury, stroke, or contrast induced renal failure. This  interventional procedure involves the use of X-rays and because of the nature of the planned procedure, it is possible that we will have prolonged use of X-ray fluoroscopy. Potential radiation risks to you include (but are not limited to) the following: - A slightly elevated risk for cancer  several years later in life. This risk is typically less than 0.5% percent. This risk is low in comparison to the normal incidence of human cancer, which is 33% for women and 50% for men according to the [employer]. - Radiation induced injury can include skin redness, resembling a rash, tissue breakdown / ulcers and hair loss (which can be temporary or permanent).  The likelihood of either of these occurring depends on the difficulty of the procedure and whether you are sensitive to radiation due to previous procedures, disease, or genetic conditions.  IF your procedure requires a prolonged use of radiation, you will be notified and given written instructions for further action.  It is your responsibility to monitor the irradiated area for the 2 weeks following the procedure and to notify your physician if you are concerned that you have suffered a radiation induced injury.   All of the patient's questions were answered, patient is agreeable to proceed. Consent signed and in chart.   Thank you for this interesting consult.  I greatly enjoyed meeting Victor Pena and look forward to participating in their care.  A copy of this report was sent to the requesting provider on this date.  Electronically Signed: Elwin Mocha, PA-C 07/19/2019, 9:27 AM   I spent a total of 40 Minutes in face to face in clinical consultation, greater than 50% of which was counseling/coordinating care for brain aneurysms/diagnostic cerebral arteriogram.

## 2019-07-19 NOTE — Discharge Instructions (Addendum)
Radial Site Care  This sheet gives you information about how to care for yourself after your procedure. Your health care provider may also give you more specific instructions. If you have problems or questions, contact your health care provider. What can I expect after the procedure? After the procedure, it is common to have:  Bruising and tenderness at the catheter insertion area. Follow these instructions at home: Medicines  Take over-the-counter and prescription medicines only as told by your health care provider. Insertion site care  Follow instructions from your health care provider about how to take care of your insertion site. Make sure you: ? Wash your hands with soap and water before you change your bandage (dressing). If soap and water are not available, use hand sanitizer. ? Change your dressing as told by your health care provider. ? Leave stitches (sutures), skin glue, or adhesive strips in place. These skin closures may need to stay in place for 2 weeks or longer. If adhesive strip edges start to loosen and curl up, you may trim the loose edges. Do not remove adhesive strips completely unless your health care provider tells you to do that.  Check your insertion site every day for signs of infection. Check for: ? Redness, swelling, or pain. ? Fluid or blood. ? Pus or a bad smell. ? Warmth.  Do not take baths, swim, or use a hot tub until your health care provider approves.  You may shower 24-48 hours after the procedure, or as directed by your health care provider. ? Remove the dressing and gently wash the site with plain soap and water. ? Pat the area dry with a clean towel. ? Do not rub the site. That could cause bleeding.  Do not apply powder or lotion to the site. Activity   For 24 hours after the procedure, or as directed by your health care provider: ? Do not flex or bend the affected arm. ? Do not push or pull heavy objects with the affected arm. ? Do not  drive yourself home from the hospital or clinic. You may drive 24 hours after the procedure unless your health care provider tells you not to. ? Do not operate machinery or power tools.  Do not lift anything that is heavier than 10 lb (4.5 kg), or the limit that you are told, until your health care provider says that it is safe.  Ask your health care provider when it is okay to: ? Return to work or school. ? Resume usual physical activities or sports. ? Resume sexual activity. General instructions  If the catheter site starts to bleed, raise your arm and put firm pressure on the site. If the bleeding does not stop, get help right away. This is a medical emergency.  If you went home on the same day as your procedure, a responsible adult should be with you for the first 24 hours after you arrive home.  Keep all follow-up visits as told by your health care provider. This is important. Contact a health care provider if:  You have a fever.  You have redness, swelling, or yellow drainage around your insertion site. Get help right away if:  You have unusual pain at the radial site.  The catheter insertion area swells very fast.  The insertion area is bleeding, and the bleeding does not stop when you hold steady pressure on the area.  Your arm or hand becomes pale, cool, tingly, or numb. These symptoms may represent a serious problem   that is an emergency. Do not wait to see if the symptoms will go away. Get medical help right away. Call your local emergency services (911 in the [place] Do not drive yourself to the hospital. Summary  After the procedure, it is common to have bruising and tenderness at the site.  Follow instructions from your health care provider about how to take care of your radial site wound. Check the wound every day for signs of infection.  Do not lift anything that is heavier than 10 lb (4.5 kg), or the limit that you are told, until your health care provider says  that it is safe. This information is not intended to replace advice given to you by your health care provider. Make sure you discuss any questions you have with your health care provider. Document Revised: 04/26/2017 Document Reviewed: 04/26/2017 Elsevier Patient Education  2020 [employer] Cerebral Aneurysm  A cerebral aneurysm is a bulge that occurs in the blood vessel inside the brain. An aneurysm is caused when a weakened part of the blood vessel expands. The blood vessel expands due to the constant pressure from the flow of blood through the weakened blood vessel. As the weakened aneurysm expands, the walls of the aneurysm become weaker. Aneurysms are dangerous because they can leak or burst (rupture). When a cerebral aneurysm ruptures, it causes bleeding in the brain (subarachnoid hemorrhage). The blood flow to the area of the brain supplied by the artery is also reduced. This can cause stroke, seizures, or coma. A ruptured cerebral aneurysm is a medical emergency. This can cause permanent brain damage or death. What are the causes? The exact cause of this condition is not known. What increases the risk? This condition is more likely to develop in people who:  Are older. The condition is most common in people between the ages of 65-60.  Are male  Have a family history of aneurysm in two or more direct relatives.  Have certain conditions that are passed along from parent to child (inherited). They include: ? Autosomal dominant polycystic kidney disease. This is a condition in which small, fluid-filled sacs (cysts) develop in the kidney. ? Neurofibromatosis type 1. In this condition, flat spots develop under the skin (pigmentation) and tumors grow along nerves in the skin, brain, and other parts of the body. ? Ehlers-Danlos syndrome. This is a condition in which bad connective tissue causes loose or unstable joints and creates a very soft skin that bruises or tears  easily.  Smoke.  Have high blood pressure (hypertension).  Abuse alcohol. What are the signs or symptoms? The signs and symptoms of a cerebral aneurysm that has not leaked or ruptured can depend on its size and rate of growth. A small, unchanging aneurysm generally does not cause symptoms. A larger aneurysm that is steadily growing can increase pressure on the brain or nerves.  The increased pressure from a cerebral aneurysm that has not leaked or ruptured can cause:  A headache.  Vision problems.  Numbness or weakness in an arm or leg.  Memory problems.  Problems speaking.  Seizures. If an aneurysm leaks or ruptures, it can cause a life-threatening condition, such as stroke. Symptoms may include:  A sudden, severe headache with no known cause. The headache is often described as the worst headache ever experienced.  Stiff neck.  Nausea or vomiting, especially when combined with other symptoms, such as a headache.  Sudden weakness or numbness of the face, arm, or leg, especially on one side  of the body.  Sudden trouble walking or difficulty moving the arms or legs.  Double vision.  Sudden trouble seeing in one or both eyes.  Trouble speaking or understanding speech (aphasia).  Trouble swallowing.  Dizziness.  Loss of balance or coordination.  Intolerance to light.  Sudden confusion or loss of consciousness. How is this diagnosed? This condition is diagnosed using certain tests, including:  CT scan.  Computed tomographic angiogram (CTA). This test uses a dye and a scanner to produce images of your blood vessels.  Magnetic resonance angiogram (MRA). This test uses an MRI machine to produce images of your blood vessels.  Digital subtraction angiogram (DSA). This test involves placing a flexible, thin tube (catheter) into the artery in your thigh and guiding it up to the arteries in the brain. A dye is then injected into the area and X-rays are taken to create  images of your blood vessels. How is this treated? Unruptured aneurysm Treatment is complex when an aneurysm is found and it is not causing problems. Treatment is individualized, as each case is different. Many factors must be considered, such as the size and exact location of your aneurysm, your age, your overall health, and your preferences. Small aneurysms in certain locations of the brain have a very low chance of bleeding or rupturing. These small aneurysms may not be treated.  In some cases, however, treatment may be required. Treatment depends on the size and location of the aneurysm. They may include:  Coiling. During this procedure, a catheter is inserted and advanced through a blood vessel. Once the catheter reaches the aneurysm, tiny coils are used to block blood flow into the aneurysm. This procedure is sometimes done at the same time as a DSA.  Surgical clipping. During surgery, a clip is placed at the base of the aneurysm. The clip prevents blood from continuing to enter the aneurysm.  Flow diversion. This procedure is used to divert blood flow around the aneurysm with a stent that is placed across the opening of an aneurysm. Ruptured aneurysm Immediate emergency surgery or coiling may be needed to help prevent damage to the brain and to reduce the risk of rebleeding. The timing of treatment is an important factor in preventing complications. Successful early treatment of a ruptured aneurysm within the first 3 days of a bleed helps to prevent rebleeding and blood vessel spasm. In some cases, there may be a reason to treat 10-14 days after a rupture. Many factors are considered when making this decision, and each case is handled individually. Follow these instructions at home: If your aneurysm is not treated:  Take over-the-counter and prescription medicines only as told by your health care provider.  Follow a diet suggested by your health care provider. Certain dietary changes may be  advised to address high blood pressure (hypertension), such as choosing foods that are low in salt (sodium), saturated fat, trans fat, and cholesterol.  Stay physically active. It is recommended that you get at least 30 minutes of activity on most or all days.  Do not use any products that contain nicotine or tobacco, such as cigarettes and e-cigarettes. If you need help quitting, ask your health care provider.  Limit alcohol intake to no more than 1 drink a day for nonpregnant women and 2 drinks a day for men. One drink equals 12 oz of beer, 5 oz of wine, or 1 oz of hard liquor.  Do not use street drugs. If you need help quitting, ask your  health care provider.  Keep all follow-up visits as told by your health care provider. This is important. This includes any referrals, imaging studies, and laboratory tests. Proper follow-up may prevent an aneurysm rupture or a stroke. Get help right away if:  You have a sudden, severe headache with no known cause. This may include a stiff neck.  You have sudden nausea or vomiting with a severe headache.  You have a seizure.  You have other symptoms of stroke. The acronym BEFAST is an easy way to remember the main warning signs of stroke. ? B = Balance problems. Signs include dizziness, sudden trouble walking, or loss of balance. ? E = Eye problems. This includes trouble seeing or a sudden change in vision. ? F = Face changes. This includes sudden weakness or numbness of the face, or the face or eyelid drooping to one side. ? A = Arm weakness or numbness. This happens suddenly and usually on one side of the body. ? S = Speech problems. This includes trouble speaking or trouble understanding. ? T = Time. Time to call 911 or seek emergency care. Do not wait to see if symptoms will go away. Make note of the time your symptoms started. These symptoms may represent a serious problem that is an emergency. Do not wait to see if the symptoms will go away. Get  medical help right away. Call your local emergency services (911 in the [place] Do not drive yourself to the hospital. Summary  An aneurysm is a bulge in an artery.  Aneurysms are dangerous because they can leak or burst (rupture). When a cerebral aneurysm ruptures, it causes bleeding in the brain.  Treatment depends on whether the aneurysm is ruptured. A ruptured aneurysm is a medical emergency.  Get help right away if you have symptoms of stroke. The acronym BEFAST is an easy way to remember the main warning signs of stroke. This information is not intended to replace advice given to you by your health care provider. Make sure you discuss any questions you have with your health care provider. Document Revised: 12/08/2017 Document Reviewed: 04/28/2016 Elsevier Patient Education  2020 [employer] Inc[employer]  Moderate Conscious Sedation, Adult, Care After These instructions provide you with information about caring for yourself after your procedure. Your health care provider may also give you more specific instructions. Your treatment has been planned according to current medical practices, but problems sometimes occur. Call your health care provider if you have any problems or questions after your procedure. What can I expect after the procedure? After your procedure, it is common:  To feel sleepy for several hours.  To feel clumsy and have poor balance for several hours.  To have poor judgment for several hours.  To vomit if you eat too soon. Follow these instructions at home: For at least 24 hours after the procedure:   Do not: ? Participate in activities where you could fall or become injured. ? Drive. ? Use heavy machinery. ? Drink alcohol. ? Take sleeping pills or medicines that cause drowsiness. ? Make important decisions or sign legal documents. ? Take care of children on your own.  Rest. Eating and drinking  Follow the diet recommended by your health care provider.  If you  vomit: ? Drink water, juice, or soup when you can drink without vomiting. ? Make sure you have little or no nausea before eating solid foods. General instructions  Have a responsible adult stay with you until you are awake and alert.  Take over-the-counter and prescription medicines only as told by your health care provider.  If you smoke, do not smoke without supervision.  Keep all follow-up visits as told by your health care provider. This is important. Contact a health care provider if:  You keep feeling nauseous or you keep vomiting.  You feel light-headed.  You develop a rash.  You have a fever. Get help right away if:  You have trouble breathing. This information is not intended to replace advice given to you by your health care provider. Make sure you discuss any questions you have with your health care provider. Document Revised: 03/03/2017 Document Reviewed: 07/11/2015 Elsevier Patient Education  [place] After This sheet gives you information about how to care for yourself after your procedure. Your doctor may also give you more specific instructions. If you have problems or questions, contact your doctor. Follow these instructions at home: Insertion site care  Follow instructions from your doctor about how to take care of your long, thin tube (catheter) insertion area. Make sure you: ? Wash your hands with soap and water before you change your bandage (dressing). If you cannot use soap and water, use hand sanitizer. ? Change your bandage as told by your doctor. ? Leave stitches (sutures), skin glue, or skin tape (adhesive) strips in place. They may need to stay in place for 2 weeks or longer. If tape strips get loose and curl up, you may trim the loose edges. Do not remove tape strips completely unless your doctor says it is okay.  Do not take baths, swim, or use a hot tub until your doctor says it is okay.  You may shower 24-48 hours  after the procedure or as told by your doctor. ? Gently wash the area with plain soap and water. ? Pat the area dry with a clean towel. ? Do not rub the area. This may cause bleeding.  Do not apply powder or lotion to the area. Keep the area clean and dry.  Check your insertion area every day for signs of infection. Check for: ? More redness, swelling, or pain. ? Fluid or blood. ? Warmth. ? Pus or a bad smell. Activity  Rest as told by your doctor, usually for 1-2 days.  Do not lift anything that is heavier than 10 lbs. (4.5 kg) or as told by your doctor.  Do not drive for 24 hours if you were given a medicine to help you relax (sedative).  Do not drive or use heavy machinery while taking prescription pain medicine. General instructions   Go back to your normal activities as told by your doctor, usually in about a week. Ask your doctor what activities are safe for you.  If the insertion area starts to bleed, lie flat and put pressure on the area. If the bleeding does not stop, get help right away. This is an emergency.  Drink enough fluid to keep your pee (urine) clear or pale yellow.  Take over-the-counter and prescription medicines only as told by your doctor.  Keep all follow-up visits as told by your doctor. This is important. Contact a doctor if:  You have a fever.  You have chills.  You have more redness, swelling, or pain around your insertion area.  You have fluid or blood coming from your insertion area.  The insertion area feels warm to the touch.  You have pus or a bad smell coming from your insertion area.  You have more  bruising around the insertion area.  Blood collects in the tissue around the insertion area (hematoma) that may be painful to the touch. Get help right away if:  You have a lot of pain in the insertion area.  The insertion area swells very fast.  The insertion area is bleeding, and the bleeding does not stop after holding steady  pressure on the area.  The area near or just beyond the insertion area becomes pale, cool, tingly, or numb. These symptoms may be an emergency. Do not wait to see if the symptoms will go away. Get medical help right away. Call your local emergency services (911 in the [place] Do not drive yourself to the hospital. Summary  After the procedure, it is common to have bruising and tenderness at the long, thin tube insertion area.  After the procedure, it is important to rest and drink plenty of fluids.  Do not take baths, swim, or use a hot tub until your doctor says it is okay to do so. You may shower 24-48 hours after the procedure or as told by your doctor.  If the insertion area starts to bleed, lie flat and put pressure on the area. If the bleeding does not stop, get help right away. This is an emergency. This information is not intended to replace advice given to you by your health care provider. Make sure you discuss any questions you have with your health care provider. Document Revised: 03/03/2017 Document Reviewed: 03/15/2016 Elsevier Patient Education  2020 [employer].

## 2019-07-19 NOTE — Procedures (Signed)
INTERVENTIONAL NEURORADIOLOGY BRIEF POSTPROCEDURE NOTE  Blair Heys  Attending: Dr. Baldemar Lenis, MD  Assistant: None  Diagnosis: Right ICA aneurysm, left MCA aneurysm, question right CCF  Access site: Distal right radial artery  Access closure: Inflatable band  Anesthesia: Moderate  Medication used: 1 mg Versed IV; 50 mcg Fentanyl IV.  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 No early opacification of the cavernous sinus to suggest the presence of a fistula.  The patient tolerated the procedure well without incident or complication and is in stable condition.

## 2019-07-23 ENCOUNTER — Telehealth (HOSPITAL_COMMUNITY): Payer: Self-pay

## 2019-07-23 NOTE — Telephone Encounter (Signed)
Called to schedule consultation with Dr. Quay Burow, no answer, left vm. AW

## 2019-07-24 ENCOUNTER — Other Ambulatory Visit (HOSPITAL_COMMUNITY): Payer: Self-pay | Admitting: Neuroradiology

## 2019-07-24 ENCOUNTER — Telehealth (HOSPITAL_COMMUNITY): Payer: Self-pay

## 2019-07-24 DIAGNOSIS — I671 Cerebral aneurysm, nonruptured: Secondary | ICD-10-CM

## 2019-07-24 NOTE — Telephone Encounter (Signed)
Returned pt's call, no answer, left vm. AW  

## 2019-07-26 ENCOUNTER — Other Ambulatory Visit: Payer: Self-pay

## 2019-07-26 ENCOUNTER — Ambulatory Visit (HOSPITAL_COMMUNITY)
Admission: RE | Admit: 2019-07-26 | Discharge: 2019-07-26 | Disposition: A | Discharge status: Home / Self Care | Payer: Managed Care, Other (non HMO) | Source: Ambulatory Visit | Attending: Neuroradiology | Admitting: Neuroradiology

## 2019-07-26 DIAGNOSIS — I671 Cerebral aneurysm, nonruptured: Secondary | ICD-10-CM

## 2019-07-26 NOTE — Progress Notes (Signed)
Chief Complaint: The patient is seen in follow up today s/p diagnostic cerebral angiogram.  History of present illness: Victor Pena is a 64 y.o. male with a past medical history of chronic back pain and intermittent exotropia of left eye for which he underwent an MRI and MRA of the brain that showed incidental 5 mm left MCA aneurysm and possible bilateral ICA aneurysms.There was a question of possible CCF on the MRI. He underwent a 6-vessel diagnostic cerebral angiogram on 07/19/2019 that showed a 4.4 x 4.2 mm wide neck  (4.6 mm) left MCA bifurcation aneurysm and a blister like right ICA bifurcation aneurysm measuring 5.6 x 3.1 mm with superimposed pseudolobulation.     Past Medical History:  Diagnosis Date   Diplopia     Past Surgical History:  Procedure Laterality Date   BACK SURGERY     IR 3D INDEPENDENT WKST  07/19/2019   IR 3D INDEPENDENT WKST  07/19/2019   IR ANGIO EXTERNAL CAROTID SEL EXT CAROTID BILAT MOD SED  07/19/2019   IR ANGIO INTRA EXTRACRAN SEL INTERNAL CAROTID BILAT MOD SED  07/19/2019   IR ANGIO VERTEBRAL SEL VERTEBRAL BILAT MOD SED  07/19/2019   IR US GUIDE VASC ACCESS RIGHT  07/19/2019    Allergies: Bee pollen  Medications: Prior to Admission medications   Medication Sig Start Date End Date Taking? Authorizing Provider  diazepam (VALIUM) 5 MG tablet Take 1 tablet 30 mins prior to MRI. May take second dose if needed. Patient not taking: Reported on 07/17/2019 07/02/19   Van Clines, MD  ibuprofen (ADVIL) 200 MG tablet Take 400 mg by mouth every 6 (six) hours as needed for moderate pain.    [provider]     Family History  Problem Relation Age of Onset   Aneurysm Father     Social History   Socioeconomic History   Marital status: Married    Spouse name: Not on file   Number of children: 4   Years of education: 16   Highest education level: Not on file  Occupational History   Occupation: owner   Tobacco Use   Smoking status: Current  Every Day Smoker   Smokeless tobacco: Current User  Substance and Sexual Activity   Alcohol use: Yes   Drug use: Not on file   Sexual activity: Not on file  Other Topics Concern   Not on file  Social History Narrative   Right handed   3 story home   Drinks caffeine   Social Determinants of Health   Financial Resource Strain:    Difficulty of Paying Living Expenses:   Food Insecurity:    Worried About Programme researcher, broadcasting/film/video in the Last Year:    Barista in the Last Year:   Transportation Needs:    Freight forwarder (Medical):    Lack of Transportation (Non-Medical):   Physical Activity:    Days of Exercise per Week:    Minutes of Exercise per Session:   Stress:    Feeling of Stress :   Social Connections:    Frequency of Communication with Friends and Family:    Frequency of Social Gatherings with Friends and Family:    Attends Religious Services:    Active Member of Clubs or Organizations:    Attends Banker Meetings:    Marital Status:      Vital Signs: There were no vitals taken for this visit.  Physical Exam  Imaging: No results  found.  Labs:  CBC: Recent Labs    07/15/19 1344 07/19/19 0909  WBC 8.9 6.6  HGB 14.7 14.9  HCT 43.9 44.0  PLT 232 257    COAGS: Recent Labs    07/15/19 1344  INR 1.0  APTT 29    BMP: Recent Labs    07/15/19 1344 07/19/19 0909  NA 140 142  K 5.2* 4.4  CL 102 104  CO2 26 22  GLUCOSE 107* 97  BUN 17 14  CALCIUM 9.9 9.7  CREATININE 0.93 0.91  GFRNONAA >60 >60  GFRAA >60 >60    LIVER FUNCTION TESTS: No results for input(s): BILITOT, AST, ALT, ALKPHOS, PROT, ALBUMIN in the last 8760 hours.  Assessment: Results from the diagnostic cerebral angiogram were discussed with the patient and his wife. They were told that no evidence of a CCF was noted. The outpouching from the left ICA was confirmed to be an infundibulum. The outpouching from the right ICA was confirmed to be an aneurysm. 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 I given size and shape of the aneurysms, including pseudolobulation in the right ICA aneurysm, as well as family history of subarachnoid hemorrhage and tobacco use, I recommend treatment of the right ICA aneurysm with a flow diverter and left MCA aneurysm with a web device or stent assisted coiling. Each aneurysm would be treated in a separate session with a 3-4 weeks interval between the sessions. Patient was informed of the need to start dual antiplatelet therapy 5-7 days prior to the procedure. All questions were answered to their satisfaction. Patient will be contacted for scheduling.   Signed: Pedro Earls, MD 07/26/2019, 1:18 PM    I spent a total of 25 Minutes in face to face in clinical consultation, greater than 50% of which was counseling/coordinating care for brain aneurysms.

## 2019-07-29 ENCOUNTER — Ambulatory Visit: Payer: Managed Care, Other (non HMO) | Attending: Internal Medicine

## 2019-07-29 DIAGNOSIS — Z23 Encounter for immunization: Secondary | ICD-10-CM

## 2019-07-29 NOTE — Progress Notes (Signed)
   Covid-19 Vaccination Clinic  Name:  Victor Pena    MRN: [id number] DOB: [birth year]  07/29/2019  [name] was observed post Covid-19 immunization for 15 minutes without incident. He was provided with Vaccine Information Sheet and instruction to access the V-Safe system.   [name] was instructed to call 911 with any severe reactions post vaccine: Marland Kitchen Difficulty breathing  . Swelling of face and throat  . A fast heartbeat  . A bad rash all over body  . Dizziness and weakness   Immunizations Administered    Name Date Dose VIS Date Route   Pfizer COVID-19 Vaccine 07/29/2019  4:38 PM 0.3 mL 05/29/2018 Intramuscular   Manufacturer: [employer], [employer]   Lot: [id number]   NDC: [address]-8

## 2019-07-30 ENCOUNTER — Other Ambulatory Visit (HOSPITAL_COMMUNITY): Payer: Self-pay | Admitting: Neuroradiology

## 2019-07-30 DIAGNOSIS — I671 Cerebral aneurysm, nonruptured: Secondary | ICD-10-CM

## 2019-08-03 ENCOUNTER — Other Ambulatory Visit (HOSPITAL_COMMUNITY)
Admission: RE | Admit: 2019-08-03 | Discharge: 2019-08-03 | Disposition: A | Discharge status: Home / Self Care | Payer: Managed Care, Other (non HMO) | Source: Ambulatory Visit | Attending: Neuroradiology | Admitting: Neuroradiology

## 2019-08-03 ENCOUNTER — Encounter (HOSPITAL_COMMUNITY): Payer: Self-pay | Admitting: *Deleted

## 2019-08-03 NOTE — Progress Notes (Signed)
Patient denies chest pain, shortness of breath, cardiology test/ visits, or having a regular PCP. Reports he goes to [facility] when he gets sick.   When re-educating patient about needing to maintain quarantine he advised that he has just found out that his son was going to be getting an award at [employer] on Monday night. He asked about going as he said it was really important the he go. I cancelled the COVID test today and rescheduled him for recollect on Tuesday morning.

## 2019-08-05 ENCOUNTER — Other Ambulatory Visit (HOSPITAL_COMMUNITY): Payer: Self-pay

## 2019-08-05 ENCOUNTER — Telehealth: Payer: Self-pay | Admitting: Student

## 2019-08-05 DIAGNOSIS — I729 Aneurysm of unspecified site: Secondary | ICD-10-CM

## 2019-08-05 LAB — PLATELET INHIBITION P2Y12: Platelet Function  P2Y12: 10 [PRU] — ABNORMAL LOW (ref 182–335)

## 2019-08-05 NOTE — Telephone Encounter (Signed)
NIR.  Received message from patient's wife regarding symptoms (frequent headaches that have now increased. He also had today numbness in his R hand and lots of confusion). Discussed with Dr. Quay Burow who states probably just nervousness for procedure, but cannot exclude medication reactions, therefore recommends switching patient to Brilinta 90 mg once daily (from twice daily).  Called patient at [phone] to discuss above. Spoke with both patient and his wife. All questions answered and concerns addressed. Patient and wife convey understanding and agree with plan.   Waylan Boga Tresha Muzio, PA-C 08/05/2019, 3:02 PM

## 2019-08-06 ENCOUNTER — Inpatient Hospital Stay (HOSPITAL_COMMUNITY): Payer: Managed Care, Other (non HMO) | Admitting: Certified Registered"

## 2019-08-06 ENCOUNTER — Encounter (HOSPITAL_COMMUNITY)
Admission: EM | Disposition: A | Discharge status: Home / Self Care | Payer: Self-pay | Source: Home / Self Care | Attending: Neuroradiology

## 2019-08-06 ENCOUNTER — Encounter (HOSPITAL_COMMUNITY): Payer: Self-pay | Admitting: Certified Registered Nurse Anesthetist

## 2019-08-06 ENCOUNTER — Inpatient Hospital Stay (HOSPITAL_COMMUNITY)
Admission: EM | Admit: 2019-08-06 | Discharge: 2019-08-20 | DRG: 020 | Disposition: A | Discharge status: Home / Self Care | Payer: Managed Care, Other (non HMO) | Attending: Neuroradiology | Admitting: Neuroradiology

## 2019-08-06 ENCOUNTER — Other Ambulatory Visit: Payer: Self-pay

## 2019-08-06 ENCOUNTER — Other Ambulatory Visit (HOSPITAL_COMMUNITY)
Admission: RE | Admit: 2019-08-06 | Discharge: 2019-08-06 | Disposition: A | Discharge status: Home / Self Care | Payer: Managed Care, Other (non HMO) | Source: Ambulatory Visit | Attending: Neuroradiology | Admitting: Neuroradiology

## 2019-08-06 ENCOUNTER — Inpatient Hospital Stay (HOSPITAL_COMMUNITY): Payer: Managed Care, Other (non HMO)

## 2019-08-06 ENCOUNTER — Encounter (HOSPITAL_COMMUNITY): Payer: Self-pay | Admitting: Neurology

## 2019-08-06 ENCOUNTER — Emergency Department (HOSPITAL_COMMUNITY): Payer: Managed Care, Other (non HMO)

## 2019-08-06 ENCOUNTER — Inpatient Hospital Stay (HOSPITAL_COMMUNITY)
Admit: 2019-08-06 | Discharge: 2019-08-06 | Disposition: A | Discharge status: Home / Self Care | Payer: Managed Care, Other (non HMO) | Attending: Neuroradiology | Admitting: Neuroradiology

## 2019-08-06 DIAGNOSIS — F1010 Alcohol abuse, uncomplicated: Secondary | ICD-10-CM | POA: Diagnosis not present

## 2019-08-06 DIAGNOSIS — E7800 Pure hypercholesterolemia, unspecified: Secondary | ICD-10-CM | POA: Diagnosis not present

## 2019-08-06 DIAGNOSIS — Z7902 Long term (current) use of antithrombotics/antiplatelets: Secondary | ICD-10-CM | POA: Diagnosis not present

## 2019-08-06 DIAGNOSIS — Z01812 Encounter for preprocedural laboratory examination: Secondary | ICD-10-CM | POA: Insufficient documentation

## 2019-08-06 DIAGNOSIS — I609 Nontraumatic subarachnoid hemorrhage, unspecified: Secondary | ICD-10-CM | POA: Diagnosis present

## 2019-08-06 DIAGNOSIS — H902 Conductive hearing loss, unspecified: Secondary | ICD-10-CM | POA: Diagnosis present

## 2019-08-06 DIAGNOSIS — F17200 Nicotine dependence, unspecified, uncomplicated: Secondary | ICD-10-CM | POA: Diagnosis not present

## 2019-08-06 DIAGNOSIS — I639 Cerebral infarction, unspecified: Secondary | ICD-10-CM | POA: Diagnosis present

## 2019-08-06 DIAGNOSIS — Z7982 Long term (current) use of aspirin: Secondary | ICD-10-CM | POA: Diagnosis not present

## 2019-08-06 DIAGNOSIS — Z7289 Other problems related to lifestyle: Secondary | ICD-10-CM | POA: Diagnosis not present

## 2019-08-06 DIAGNOSIS — R531 Weakness: Secondary | ICD-10-CM | POA: Diagnosis present

## 2019-08-06 DIAGNOSIS — G8929 Other chronic pain: Secondary | ICD-10-CM | POA: Diagnosis present

## 2019-08-06 DIAGNOSIS — R4701 Aphasia: Secondary | ICD-10-CM | POA: Diagnosis present

## 2019-08-06 DIAGNOSIS — F17210 Nicotine dependence, cigarettes, uncomplicated: Secondary | ICD-10-CM | POA: Diagnosis present

## 2019-08-06 DIAGNOSIS — Z720 Tobacco use: Secondary | ICD-10-CM | POA: Diagnosis not present

## 2019-08-06 DIAGNOSIS — R1310 Dysphagia, unspecified: Secondary | ICD-10-CM | POA: Diagnosis present

## 2019-08-06 DIAGNOSIS — M549 Dorsalgia, unspecified: Secondary | ICD-10-CM | POA: Diagnosis present

## 2019-08-06 DIAGNOSIS — R4702 Dysphasia: Secondary | ICD-10-CM | POA: Diagnosis present

## 2019-08-06 DIAGNOSIS — H532 Diplopia: Secondary | ICD-10-CM | POA: Diagnosis present

## 2019-08-06 DIAGNOSIS — R29810 Facial weakness: Secondary | ICD-10-CM | POA: Diagnosis present

## 2019-08-06 DIAGNOSIS — Z20822 Contact with and (suspected) exposure to covid-19: Secondary | ICD-10-CM | POA: Insufficient documentation

## 2019-08-06 DIAGNOSIS — E785 Hyperlipidemia, unspecified: Secondary | ICD-10-CM | POA: Diagnosis present

## 2019-08-06 DIAGNOSIS — I671 Cerebral aneurysm, nonruptured: Secondary | ICD-10-CM

## 2019-08-06 DIAGNOSIS — H50112 Monocular exotropia, left eye: Secondary | ICD-10-CM | POA: Diagnosis present

## 2019-08-06 DIAGNOSIS — R479 Unspecified speech disturbances: Secondary | ICD-10-CM

## 2019-08-06 DIAGNOSIS — Z79899 Other long term (current) drug therapy: Secondary | ICD-10-CM | POA: Diagnosis not present

## 2019-08-06 DIAGNOSIS — I6389 Other cerebral infarction: Secondary | ICD-10-CM | POA: Diagnosis not present

## 2019-08-06 DIAGNOSIS — I6012 Nontraumatic subarachnoid hemorrhage from left middle cerebral artery: Secondary | ICD-10-CM

## 2019-08-06 DIAGNOSIS — E872 Acidosis: Secondary | ICD-10-CM | POA: Diagnosis not present

## 2019-08-06 DIAGNOSIS — R29701 NIHSS score 1: Secondary | ICD-10-CM | POA: Diagnosis present

## 2019-08-06 HISTORY — PX: IR CT HEAD LTD: IMG2386

## 2019-08-06 HISTORY — PX: IR 3D INDEPENDENT WKST: IMG2385

## 2019-08-06 HISTORY — PX: IR US GUIDE VASC ACCESS RIGHT: IMG2390

## 2019-08-06 HISTORY — DX: Cerebral aneurysm, nonruptured: I67.1

## 2019-08-06 HISTORY — PX: IR TRANSCATH/EMBOLIZ: IMG695

## 2019-08-06 HISTORY — PX: IR NEURO EACH ADD'L AFTER BASIC UNI LEFT (MS): IMG5373

## 2019-08-06 HISTORY — PX: RADIOLOGY WITH ANESTHESIA: SHX6223

## 2019-08-06 HISTORY — PX: IR ANGIOGRAM FOLLOW UP STUDY: IMG697

## 2019-08-06 LAB — I-STAT CHEM 8, ED
BUN: 17 mg/dL (ref 8–23)
Calcium, Ion: 1.18 mmol/L (ref 1.15–1.40)
Chloride: 103 mmol/L (ref 98–111)
Creatinine, Ser: 0.8 mg/dL (ref 0.61–1.24)
Glucose, Bld: 106 mg/dL — ABNORMAL HIGH (ref 70–99)
HCT: 43 % (ref 39.0–52.0)
Hemoglobin: 14.6 g/dL (ref 13.0–17.0)
Potassium: 3.8 mmol/L (ref 3.5–5.1)
Sodium: 138 mmol/L (ref 135–145)
TCO2: 23 mmol/L (ref 22–32)

## 2019-08-06 LAB — DIFFERENTIAL
Abs Immature Granulocytes: 0.03 10*3/uL (ref 0.00–0.07)
Basophils Absolute: 0 10*3/uL (ref 0.0–0.1)
Basophils Relative: 0 %
Eosinophils Absolute: 0.1 10*3/uL (ref 0.0–0.5)
Eosinophils Relative: 1 %
Immature Granulocytes: 0 %
Lymphocytes Relative: 13 %
Lymphs Abs: 1 10*3/uL (ref 0.7–4.0)
Monocytes Absolute: 0.9 10*3/uL (ref 0.1–1.0)
Monocytes Relative: 11 %
Neutro Abs: 5.7 10*3/uL (ref 1.7–7.7)
Neutrophils Relative %: 75 %

## 2019-08-06 LAB — COMPREHENSIVE METABOLIC PANEL
ALT: 46 U/L — ABNORMAL HIGH (ref 0–44)
AST: 49 U/L — ABNORMAL HIGH (ref 15–41)
Albumin: 4 g/dL (ref 3.5–5.0)
Alkaline Phosphatase: 60 U/L (ref 38–126)
Anion gap: 13 (ref 5–15)
BUN: 15 mg/dL (ref 8–23)
CO2: 21 mmol/L — ABNORMAL LOW (ref 22–32)
Calcium: 9.6 mg/dL (ref 8.9–10.3)
Chloride: 102 mmol/L (ref 98–111)
Creatinine, Ser: 1.01 mg/dL (ref 0.61–1.24)
GFR calc Af Amer: >60 mL/min (ref >60)
GFR calc non Af Amer: >60 mL/min (ref >60)
Glucose, Bld: 113 mg/dL — ABNORMAL HIGH (ref 70–99)
Potassium: 3.9 mmol/L (ref 3.5–5.1)
Sodium: 136 mmol/L (ref 135–145)
Total Bilirubin: 0.9 mg/dL (ref 0.3–1.2)
Total Protein: 7.4 g/dL (ref 6.5–8.1)

## 2019-08-06 LAB — CBG MONITORING, ED: Glucose-Capillary: 109 mg/dL — ABNORMAL HIGH (ref 70–99)

## 2019-08-06 LAB — PREPARE RBC (CROSSMATCH)

## 2019-08-06 LAB — APTT
aPTT: 27 seconds (ref 24–36)
aPTT: 28 seconds (ref 24–36)

## 2019-08-06 LAB — RESPIRATORY PANEL BY RT PCR (FLU A&B, COVID)
Influenza A by PCR: NEGATIVE
Influenza B by PCR: NEGATIVE
SARS Coronavirus 2 by RT PCR: NEGATIVE

## 2019-08-06 LAB — CBC
HCT: 41.4 % (ref 39.0–52.0)
Hemoglobin: 13.9 g/dL (ref 13.0–17.0)
MCH: 35.7 pg — ABNORMAL HIGH (ref 26.0–34.0)
MCHC: 33.6 g/dL (ref 30.0–36.0)
MCV: 106.4 fL — ABNORMAL HIGH (ref 80.0–100.0)
Platelets: 264 10*3/uL (ref 150–400)
RBC: 3.89 MIL/uL — ABNORMAL LOW (ref 4.22–5.81)
RDW: 13.1 % (ref 11.5–15.5)
WBC: 7.7 10*3/uL (ref 4.0–10.5)
nRBC: 0 % (ref 0.0–0.2)

## 2019-08-06 LAB — LIPID PANEL
Cholesterol: 218 mg/dL — ABNORMAL HIGH (ref 0–200)
HDL: 44 mg/dL (ref >40)
LDL Cholesterol: 149 mg/dL — ABNORMAL HIGH (ref 0–99)
Total CHOL/HDL Ratio: 5 RATIO
Triglycerides: 127 mg/dL (ref <150)
VLDL: 25 mg/dL (ref 0–40)

## 2019-08-06 LAB — PROTIME-INR
INR: 1 (ref 0.8–1.2)
INR: 1 (ref 0.8–1.2)
Prothrombin Time: 12.7 seconds (ref 11.4–15.2)
Prothrombin Time: 12.6 seconds (ref 11.4–15.2)

## 2019-08-06 LAB — MRSA PCR SCREENING: MRSA by PCR: NEGATIVE

## 2019-08-06 LAB — ABO/RH: ABO/RH(D): O NEG

## 2019-08-06 LAB — POCT ACTIVATED CLOTTING TIME
Activated Clotting Time: 125 seconds
Activated Clotting Time: 197 seconds

## 2019-08-06 LAB — SARS CORONAVIRUS 2 (TAT 6-24 HRS): SARS Coronavirus 2: NEGATIVE

## 2019-08-06 SURGERY — IR WITH ANESTHESIA
Anesthesia: General

## 2019-08-06 MED ORDER — SUCCINYLCHOLINE CHLORIDE 20 MG/ML IJ SOLN
INTRAMUSCULAR | Status: DC | PRN
  Administered 2019-08-06: 140 mg via INTRAVENOUS

## 2019-08-06 MED ORDER — IOHEXOL 240 MG/ML SOLN
150.0000 mL | Freq: Once | INTRAMUSCULAR | Status: AC | PRN
  Administered 2019-08-06: 40 mL via INTRA_ARTERIAL

## 2019-08-06 MED ORDER — ACETAMINOPHEN 160 MG/5ML PO SOLN: 650.0000 mg | ORAL | Status: DC | PRN

## 2019-08-06 MED ORDER — IOHEXOL 240 MG/ML SOLN
INTRAMUSCULAR | Status: AC
  Filled 2019-08-06: qty 100

## 2019-08-06 MED ORDER — CLEVIDIPINE BUTYRATE 0.5 MG/ML IV EMUL
0.0000 mg/h | INTRAVENOUS | Status: DC
  Administered 2019-08-06: 4 mg/h via INTRAVENOUS
  Administered 2019-08-06: 5 mg/h via INTRAVENOUS
  Administered 2019-08-06: 2 mg/h via INTRAVENOUS
  Administered 2019-08-07 (×2): 8 mg/h via INTRAVENOUS
  Administered 2019-08-07: 10 mg/h via INTRAVENOUS
  Filled 2019-08-06 (×6): qty 50

## 2019-08-06 MED ORDER — PROPOFOL 10 MG/ML IV BOLUS
INTRAVENOUS | Status: DC | PRN
  Administered 2019-08-06: 50 mg via INTRAVENOUS
  Administered 2019-08-06: 150 mg via INTRAVENOUS

## 2019-08-06 MED ORDER — IOHEXOL 350 MG/ML SOLN
100.0000 mL | Freq: Once | INTRAVENOUS | Status: AC | PRN
  Administered 2019-08-16: 80 mL via INTRAVENOUS

## 2019-08-06 MED ORDER — HEPARIN SODIUM (PORCINE) 1000 UNIT/ML IJ SOLN
INTRAMUSCULAR | Status: DC | PRN
  Administered 2019-08-06: 2000 [IU] via INTRAVENOUS
  Administered 2019-08-06: 3000 [IU] via INTRAVENOUS

## 2019-08-06 MED ORDER — SENNOSIDES-DOCUSATE SODIUM 8.6-50 MG PO TABS
1.0000 | ORAL_TABLET | Freq: Two times a day (BID) | ORAL | Status: DC
  Administered 2019-08-07 – 2019-08-19 (×16): 1 via ORAL
  Filled 2019-08-06 (×23): qty 1

## 2019-08-06 MED ORDER — ASPIRIN 81 MG PO CHEW: 81.0000 mg | CHEWABLE_TABLET | Freq: Every day | ORAL | Status: DC

## 2019-08-06 MED ORDER — ACETAMINOPHEN 650 MG RE SUPP: 650.0000 mg | RECTAL | Status: DC | PRN

## 2019-08-06 MED ORDER — ACETAMINOPHEN 325 MG PO TABS
650.0000 mg | ORAL_TABLET | ORAL | Status: DC | PRN
  Administered 2019-08-07 – 2019-08-18 (×25): 650 mg via ORAL
  Filled 2019-08-06 (×25): qty 2

## 2019-08-06 MED ORDER — STROKE: EARLY STAGES OF RECOVERY BOOK
Freq: Once | Status: AC
  Filled 2019-08-06: qty 1

## 2019-08-06 MED ORDER — FENTANYL CITRATE (PF) 100 MCG/2ML IJ SOLN
INTRAMUSCULAR | Status: DC | PRN
  Administered 2019-08-06 (×4): 50 ug via INTRAVENOUS

## 2019-08-06 MED ORDER — CHLORHEXIDINE GLUCONATE CLOTH 2 % EX PADS
6.0000 | MEDICATED_PAD | Freq: Every day | CUTANEOUS | Status: DC
  Administered 2019-08-06 – 2019-08-14 (×8): 6 via TOPICAL

## 2019-08-06 MED ORDER — SODIUM CHLORIDE 0.9% FLUSH: 3.0000 mL | Freq: Once | INTRAVENOUS | Status: DC

## 2019-08-06 MED ORDER — DEXAMETHASONE SODIUM PHOSPHATE 10 MG/ML IJ SOLN
INTRAMUSCULAR | Status: DC | PRN
  Administered 2019-08-06: 10 mg via INTRAVENOUS

## 2019-08-06 MED ORDER — CLEVIDIPINE BUTYRATE 0.5 MG/ML IV EMUL
INTRAVENOUS | Status: AC
  Filled 2019-08-06: qty 50

## 2019-08-06 MED ORDER — FENTANYL CITRATE (PF) 100 MCG/2ML IJ SOLN
INTRAMUSCULAR | Status: AC
  Filled 2019-08-06: qty 2

## 2019-08-06 MED ORDER — ACETAMINOPHEN-CODEINE #3 300-30 MG PO TABS
1.0000 | ORAL_TABLET | ORAL | Status: DC | PRN
  Administered 2019-08-18: 1 via ORAL
  Filled 2019-08-06: qty 1
  Filled 2019-08-06: qty 2

## 2019-08-06 MED ORDER — IOHEXOL 300 MG/ML  SOLN
100.0000 mL | Freq: Once | INTRAMUSCULAR | Status: AC | PRN
  Administered 2019-08-06: 10 mL via INTRA_ARTERIAL

## 2019-08-06 MED ORDER — ASPIRIN 81 MG PO CHEW
81.0000 mg | CHEWABLE_TABLET | Freq: Every day | ORAL | Status: DC
  Administered 2019-08-07 – 2019-08-20 (×14): 81 mg via ORAL
  Filled 2019-08-06 (×14): qty 1

## 2019-08-06 MED ORDER — SODIUM CHLORIDE 0.9 % IV SOLN: INTRAVENOUS | Status: DC

## 2019-08-06 MED ORDER — IOHEXOL 300 MG/ML  SOLN: 150.0000 mL | Freq: Once | INTRAMUSCULAR | Status: DC | PRN

## 2019-08-06 MED ORDER — SODIUM CHLORIDE 0.9% IV SOLUTION: Freq: Once | INTRAVENOUS | Status: AC

## 2019-08-06 MED ORDER — ONDANSETRON HCL 4 MG/2ML IJ SOLN: 4.0000 mg | Freq: Four times a day (QID) | INTRAMUSCULAR | Status: DC | PRN

## 2019-08-06 MED ORDER — ROCURONIUM BROMIDE 10 MG/ML (PF) SYRINGE
PREFILLED_SYRINGE | INTRAVENOUS | Status: DC | PRN
  Administered 2019-08-06: 40 mg via INTRAVENOUS
  Administered 2019-08-06 (×3): 30 mg via INTRAVENOUS
  Administered 2019-08-06: 40 mg via INTRAVENOUS
  Administered 2019-08-06: 30 mg via INTRAVENOUS

## 2019-08-06 MED ORDER — PANTOPRAZOLE SODIUM 40 MG IV SOLR
40.0000 mg | Freq: Every day | INTRAVENOUS | Status: DC
  Administered 2019-08-06: 40 mg via INTRAVENOUS
  Filled 2019-08-06: qty 40

## 2019-08-06 MED ORDER — PNEUMOCOCCAL VAC POLYVALENT 25 MCG/0.5ML IJ INJ
0.5000 mL | INJECTION | INTRAMUSCULAR | Status: DC
  Filled 2019-08-06 (×3): qty 0.5

## 2019-08-06 MED ORDER — LIDOCAINE 2% (20 MG/ML) 5 ML SYRINGE
INTRAMUSCULAR | Status: DC | PRN
  Administered 2019-08-06: 100 mg via INTRAVENOUS

## 2019-08-06 MED ORDER — IOHEXOL 240 MG/ML SOLN
INTRAMUSCULAR | Status: AC
  Administered 2019-08-06: 90 mL
  Filled 2019-08-06: qty 100

## 2019-08-06 NOTE — Transfer of Care (Signed)
Immediate Anesthesia Transfer of Care Note  Patient: Victor Pena  Procedure(s) Performed: IR WITH ANESTHESIA (N/A )  Patient Location: PACU  Anesthesia Type:General  Level of Consciousness: awake  Airway & Oxygen Therapy: Patient Spontanous Breathing and Patient connected to nasal cannula oxygen  Post-op Assessment: Report given to RN and Post -op Vital signs reviewed and stable  Post vital signs: Reviewed and stable  Last Vitals:  Vitals Value Taken Time  BP 108/67 08/06/19 2045  Temp 36.7 C 08/06/19 2034  Pulse 81 08/06/19 2045  Resp 16 08/06/19 2045  SpO2 99 % 08/06/19 2045  Vitals shown include unvalidated device data.  Last Pain:  Vitals:   08/06/19 2034  TempSrc:   PainSc: 0-No pain         Complications: No apparent anesthesia complications

## 2019-08-06 NOTE — Code Documentation (Signed)
According to patient and wife- patient initially with some speech difficulty and right hand weakness/numbness on 5/3 around 1130 am when he was at the hospital for blood work and a COVID swab for pre-procedure with IR scheduled for 5/5 r/t a known aneurysm. Patient's symptoms resolved and he was told it was probably r/t anxiety or nerves. Symptoms once again on 5/3 at 1730 with hand numbness and aphasia. Wife states pt has had a headache for the past several days on the left side. Pt has been on Aspirin and Brilinta for scheduled angiogram and procedure planned for 5/5 since 4/28. On 5/4 patient noted right hand numbness and speech dificulty returned at 0900, resolved, and then again around 11:15. His wife drove him to the hospital and a code stroke was called in triage. Pt taken to CT with Stroke team, EDP, and RN. Per MD, CT revealed a left SAH. IR MD called and is at bedside with pt and wife. Pt is being prepped for angiogram and stenting as soon as IR bed is available. Consent was signed by patient. NIHSS is a 2 r/t some sensory changes in right hand and intermittent aphasia. TPA not given d/t contraindication from [facility] on scan as well as pt on blood thinners. Plan of Care: Clevidipine started to keep Sys BP<140, Pt prepping for IR, Keep NPO, Neuro checks q1h. Hand off with Tobi Bastos, RN. Ruvi Fullenwider, [place] Scrape, RN, BSN, [employer]

## 2019-08-06 NOTE — Progress Notes (Signed)
Pt subjectively reporting speech worsening. 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  Has MRI brain ordered and pending. Will follow imaging studies.  -- Milon Dikes, MD [facility] Pager: [phone] If 7pm to 7am, please call on call as listed on AMION.

## 2019-08-06 NOTE — Procedures (Signed)
INTERVENTIONAL NEURORADIOLOGY BRIEF POSTPROCEDURE NOTE  Attending: Dr. Baldemar Lenis, MD  Assistant: None  Diagnosis: Subarachnoid hemorrhage; ruptured left MCA bifurcation aneurysm  Access site: RCFA (71F)  Access closure: Perclose proglide  Anesthesia: General  Medication used: refer to anesthesia documentation for sedation medication.  Complications: None  Estimated blood loss: Minimal  Specimen: None  Findings: 4.8 mm wide neck left MCA bifurcation aneurysm treated with balloon assisted coiling. No thromboembolic or hemorrhagic complication.   The patient tolerated the procedure well without incident or complication and is in stable condition.   Recomendations: - Bed rest post sheath removal for 6 hours. - Patient may discontinue the Brilinta while remaining on ASA 81 mg. First dose prescribed for tonight. - STAT head CT for any neurologic decline.

## 2019-08-06 NOTE — ED Provider Notes (Addendum)
[place] EMERGENCY DEPARTMENT Provider Note   CSN: [id number] Arrival date & time: 08/06/19  1207     History No chief complaint on file.   Victor Pena is a 64 y.o. male.  HPI Patient presents with difficulty speaking.  Comes and goes.  Had yesterday some difficulty speaking and at times difficulty using his right hand.  Both numb and weak.  He will come and go.  Today had episodes of same.  At around 8 or 9:00 developed symptoms and then resolved around an hour or 2 later.  From 10-11 was fine but then at 11:00 developed difficulty speaking difficulty finding words.  Has known intracranial aneurysms and is scheduled to get a stent on the right side.  Procedure is scheduled for tomorrow.  Has dull headache behind his left eye also. Nursing had seen patient in triage came back to discuss with me and I called code stroke before I had seen him.  Met upon arrival back at the bridge.    Past Medical History:  Diagnosis Date   Diplopia    Intracranial aneurysm     Patient Active Problem List   Diagnosis Date Noted   Subarachnoid hemorrhage ([place]) 08/06/2019   Bilateral impacted cerumen 10/09/2015   Conductive hearing loss in left ear 10/09/2015   Smokes 1 pack of cigarettes per day 10/09/2015    Past Surgical History:  Procedure Laterality Date   BACK SURGERY     IR 3D INDEPENDENT WKST  07/19/2019   IR 3D INDEPENDENT WKST  07/19/2019   IR 3D INDEPENDENT WKST  08/06/2019   IR ANGIO EXTERNAL CAROTID SEL EXT CAROTID BILAT MOD SED  07/19/2019   IR ANGIO INTRA EXTRACRAN SEL INTERNAL CAROTID BILAT MOD SED  07/19/2019   IR ANGIO VERTEBRAL SEL VERTEBRAL BILAT MOD SED  07/19/2019   IR ANGIOGRAM FOLLOW UP STUDY  08/06/2019   IR ANGIOGRAM FOLLOW UP STUDY  08/06/2019   IR ANGIOGRAM FOLLOW UP STUDY  08/06/2019   IR ANGIOGRAM FOLLOW UP STUDY  08/06/2019   IR ANGIOGRAM FOLLOW UP STUDY  08/06/2019   IR ANGIOGRAM FOLLOW UP STUDY  08/06/2019   IR CT HEAD LTD  08/06/2019     IR NEURO EACH ADD'L AFTER BASIC UNI LEFT (MS)  08/06/2019   IR TRANSCATH/EMBOLIZ  08/06/2019   IR US GUIDE VASC ACCESS RIGHT  07/19/2019   IR US GUIDE VASC ACCESS RIGHT  08/06/2019   RADIOLOGY WITH ANESTHESIA N/A 08/06/2019   Procedure: IR WITH ANESTHESIA;  Surgeon: Radiologist, Medication, MD;  Location: [place];  Service: Radiology;  Laterality: N/A;       Family History  Problem Relation Age of Onset   Aneurysm Father     Social History   Tobacco Use   Smoking status: Current Every Day Smoker   Smokeless tobacco: Never Used  Substance Use Topics   Alcohol use: Yes    Alcohol/week: 10.0 standard drinks    Types: 10 Glasses of wine per week    Comment: one to two glasses of night   Drug use: Not on file    Home Medications Prior to Admission medications   Medication Sig Start Date End Date Taking? Authorizing Provider  acetaminophen (TYLENOL) 500 MG tablet Take 1,000 mg by mouth every 6 (six) hours as needed for mild pain or headache.   Yes [provider]  aspirin 81 MG EC tablet Take 81 mg by mouth daily. Swallow whole.   Yes [provider]  ibuprofen (ADVIL) 200 MG tablet Take 400 mg by mouth every 6 (six) hours as needed for moderate pain.   Yes [provider]  ticagrelor (BRILINTA) 90 MG TABS tablet Take 90 mg by mouth daily.    Yes [provider]  diazepam (VALIUM) 5 MG tablet Take 1 tablet 30 mins prior to MRI. May take second dose if needed. Patient not taking: Reported on 07/17/2019 07/02/19   [name], MD    Allergies    Other  Review of Systems   Review of Systems  Constitutional: Negative for appetite change.  HENT: Negative for congestion.   Eyes: Negative for visual disturbance.  Cardiovascular: Negative for chest pain.  Gastrointestinal: Negative for abdominal pain.  Genitourinary: Negative for flank pain.  Musculoskeletal: Negative for back pain.  Neurological: Positive for speech difficulty, weakness  and headaches.  Psychiatric/Behavioral: Negative for confusion.    Physical Exam Updated Vital Signs BP 109/74 (BP Location: Right Arm)    Pulse 63    Temp 98.4 F (36.9 C) (Oral)    Resp 18    Ht _0  (1.803 m)    Wt 86.2 kg    SpO2 99%    BMI 26.50 kg/m   Physical Exam Vitals and nursing note reviewed.  HENT:     Head: Normocephalic.  Eyes:     Extraocular Movements: Extraocular movements intact.     Pupils: Pupils are equal, round, and reactive to light.  Cardiovascular:     Rate and Rhythm: Normal rate and regular rhythm.  Pulmonary:     Breath sounds: No wheezing.  Abdominal:     Tenderness: There is no abdominal tenderness.  Musculoskeletal:        General: No tenderness.     Cervical back: Neck supple.  Skin:    General: Skin is warm.     Capillary Refill: Capillary refill takes less than 2 seconds.  Neurological:     Mental Status: [name] alert and oriented to person, place, and time.     Comments: Awake and appropriate.  Moves all extremities.  Clear speech to me but patient states he has to think about it and it is harder to speak.  Complete NIH scoring done by neurology.     ED Results / Procedures / Treatments   Labs (all labs ordered are listed, but only abnormal results are displayed) Labs Reviewed  CBC - Abnormal; Notable for the following components:      Result Value   RBC 3.89 (*)    MCV 106.4 (*)    MCH 35.7 (*)    All other components within normal limits  COMPREHENSIVE METABOLIC PANEL - Abnormal; Notable for the following components:   CO2 21 (*)    Glucose, Bld 113 (*)    AST 49 (*)    ALT 46 (*)    All other components within normal limits  LIPID PANEL - Abnormal; Notable for the following components:   Cholesterol 218 (*)    LDL Cholesterol 149 (*)    All other components within normal limits  CBC - Abnormal; Notable for the following components:   RBC 3.41 (*)    Hemoglobin 12.2 (*)    HCT 36.0 (*)    MCV 105.6 (*)    MCH 35.8 (*)     All other components within normal limits  BASIC METABOLIC PANEL - Abnormal; Notable for the following components:   CO2 17 (*)    Glucose, Bld 114 (*)  Calcium 8.1 (*)    All other components within normal limits  CBC - Abnormal; Notable for the following components:   RBC 3.34 (*)    Hemoglobin 11.9 (*)    HCT 35.6 (*)    MCV 106.6 (*)    MCH 35.6 (*)    All other components within normal limits  BASIC METABOLIC PANEL - Abnormal; Notable for the following components:   Glucose, Bld 105 (*)    Calcium 8.6 (*)    All other components within normal limits  PHOSPHORUS - Abnormal; Notable for the following components:   Phosphorus 2.3 (*)    All other components within normal limits  CBC - Abnormal; Notable for the following components:   RBC 3.77 (*)    MCV 106.1 (*)    MCH 35.0 (*)    All other components within normal limits  BASIC METABOLIC PANEL - Abnormal; Notable for the following components:   Glucose, Bld 104 (*)    All other components within normal limits  CBC - Abnormal; Notable for the following components:   RBC 3.67 (*)    HCT 38.4 (*)    MCV 104.6 (*)    MCH 35.4 (*)    All other components within normal limits  BASIC METABOLIC PANEL - Abnormal; Notable for the following components:   Glucose, Bld 101 (*)    All other components within normal limits  CBC - Abnormal; Notable for the following components:   RBC 3.85 (*)    MCV 103.6 (*)    MCH 35.3 (*)    All other components within normal limits  BASIC METABOLIC PANEL - Abnormal; Notable for the following components:   CO2 21 (*)    All other components within normal limits  VITAMIN B12 - Abnormal; Notable for the following components:   Vitamin B-12 132 (*)    All other components within normal limits  I-STAT CHEM 8, ED - Abnormal; Notable for the following components:   Glucose, Bld 106 (*)    All other components within normal limits  CBG MONITORING, ED - Abnormal; Notable for the following  components:   Glucose-Capillary 109 (*)    All other components within normal limits  RESPIRATORY PANEL BY RT PCR (FLU A&B, COVID)  MRSA PCR SCREENING  PROTIME-INR  APTT  DIFFERENTIAL  PROTIME-INR  APTT  HEMOGLOBIN A1C  TRIGLYCERIDES  MAGNESIUM  TSH  FOLATE RBC  POCT ACTIVATED CLOTTING TIME  POCT ACTIVATED CLOTTING TIME  TYPE AND SCREEN  PREPARE RBC (CROSSMATCH)  ABO/RH    EKG ED ECG REPORT   Date: 08/06/2019  Rate: 76  Rhythm: normal sinus rhythm  QRS Axis: normal  Intervals: normal  ST/T Wave abnormalities: normal  Conduction Disutrbances:none  Narrative Interpretation:   Old EKG Reviewed: none available   Radiology VAS [place] TRANSCRANIAL DOPPLER  Result Date: 08/14/2019  Transcranial Doppler Indications: Subarachnoid hemorrhage. Comparison Study: 08/12/2019 Performing Technologist: Oliver Hum RVT  Examination Guidelines: A complete evaluation includes B-mode imaging, spectral Doppler, color Doppler, and power Doppler as needed of all accessible portions of each vessel. Bilateral testing is considered an integral part of a complete examination. Limited examinations for reoccurring indications may be performed as noted.  +----------+-------------+----------+-----------+-------+  RIGHT TCD  Right VM (cm) Depth (cm) Pulsatility Comment  +----------+-------------+----------+-----------+-------+  MCA            66.00                   1.08              +----------+-------------+----------+-----------+-------+  ACA           -22.00                   1.02              +----------+-------------+----------+-----------+-------+  Term ICA       52.00                   1.36              +----------+-------------+----------+-----------+-------+  PCA            34.00                   0.86              +----------+-------------+----------+-----------+-------+  Opthalmic      17.00                   1.32              +----------+-------------+----------+-----------+-------+  ICA siphon      18.00                   1.62              +----------+-------------+----------+-----------+-------+  Vertebral     -19.00                   0.75              +----------+-------------+----------+-----------+-------+  +----------+------------+----------+-----------+-------+  LEFT TCD   Left VM (cm) Depth (cm) Pulsatility Comment  +----------+------------+----------+-----------+-------+  MCA           71.00                   1.11              +----------+------------+----------+-----------+-------+  ACA           -26.00                  1.35              +----------+------------+----------+-----------+-------+  Term ICA      48.00                   1.12              +----------+------------+----------+-----------+-------+  PCA           22.00                   1.01              +----------+------------+----------+-----------+-------+  Opthalmic     21.00                   1.12              +----------+------------+----------+-----------+-------+  ICA siphon    25.00                   1.79              +----------+------------+----------+-----------+-------+  Vertebral     -24.00                  1.04              +----------+------------+----------+-----------+-------+  +------------+-------+-------+               VM cm/s Comment  +------------+-------+-------+  Prox Basilar -29.00   0.93    +------------+-------+-------+  Dist Basilar -37.00   0.91    +------------+-------+-------+ Summary:  Normal mean flow velocities inm majority of identidied vessles of anterior and posterior cerebral circulations. No definite evidence of vasospasm noted. *See table(s) above for TCD measurements and observations.  Diagnosing physician: Antony Contras MD Electronically signed by Antony Contras MD on 08/14/2019 at 4:03:24 PM.    Final     Procedures Procedures (including critical care time)  Medications Ordered in ED Medications  iohexol (OMNIPAQUE) 350 MG/ML injection 100 mL ( Intravenous MAR Unhold 08/06/19 2034)  acetaminophen  (TYLENOL) tablet 650 mg (650 mg Oral Given 08/14/19 1013)    Or  acetaminophen (TYLENOL) 160 MG/5ML solution 650 mg ( Per Tube See Alternative 08/14/19 1013)    Or  acetaminophen (TYLENOL) suppository 650 mg ( Rectal See Alternative 08/14/19 1013)  senna-docusate (Senokot-S) tablet 1 tablet (1 tablet Oral Given 08/14/19 2300)  acetaminophen-codeine (TYLENOL #3) 300-30 MG per tablet 1-2 tablet ( Oral MAR Unhold 08/06/19 2034)  pneumococcal 23 valent vaccine (PNEUMOVAX-23) injection 0.5 mL (0.5 mLs Intramuscular Not Given 08/07/19 1023)  Chlorhexidine Gluconate Cloth 2 % PADS 6 each (6 each Topical Given 08/14/19 1518)  0.9 %  sodium chloride infusion ( Intravenous Stopped 08/08/19 1628)  aspirin chewable tablet 81 mg (81 mg Oral Given 08/14/19 0954)  LORazepam (ATIVAN) tablet 1-4 mg (1 mg Oral Given 08/09/19 2219)    Or  LORazepam (ATIVAN) injection 1-4 mg ( Intravenous See Alternative 08/09/19 2219)  thiamine tablet 100 mg (100 mg Oral Given 08/14/19 0953)    Or  thiamine (B-1) injection 100 mg ( Intravenous See Alternative 12/01/54 2130)  folic acid (FOLVITE) tablet 1 mg (1 mg Oral Given 08/14/19 0953)  multivitamin with minerals tablet 1 tablet (1 tablet Oral Given 08/14/19 0953)  atorvastatin (LIPITOR) tablet 40 mg (40 mg Oral Given 08/14/19 0953)  pantoprazole (PROTONIX) EC tablet 40 mg (40 mg Oral Given 08/14/19 0953)  niMODipine (NIMOTOP) capsule 60 mg (60 mg Oral Given 08/15/19 0445)  nicotine (NICODERM CQ - dosed in mg/24 hours) patch 14 mg (14 mg Transdermal Patch Applied 08/14/19 0955)  labetalol (NORMODYNE) injection 5-10 mg (has no administration in time range)  clevidipine (CLEVIPREX) 0.5 MG/ML infusion (  Override pull for Anesthesia 08/06/19 1742)   stroke: mapping our early stages of recovery book ( Does not apply Given 08/07/19 1422)  0.9 %  sodium chloride infusion (Manually program via Guardrails IV Fluids) ( Intravenous Duplicate 11/08/55 8469)  fentaNYL (SUBLIMAZE) 100 MCG/2ML injection (  Override  pull for Anesthesia 08/06/19 1955)  phosphorus (K PHOS NEUTRAL) tablet 500 mg (500 mg Oral Given 08/08/19 1528)  LORazepam (ATIVAN) injection 1 mg (1 mg Intravenous Given 08/10/19 2158)    ED Course  I have reviewed the triage vital signs and the nursing notes.  Pertinent labs & imaging results that were available during my care of the patient were reviewed by me and considered in my medical decision making (see chart for details).    MDM Rules/Calculators/A&P                     Patient presents for focal neuro deficits.  Code stroke called by me.  Met at the bridge by myself and Dr. Cheral Marker.  Initial head CT done and showed subarachnoid hemorrhage.  Due to have aneurysm intervened on tomorrow but will be taken emergently today to have other side done.  IV blood pressure control be done.  Admit to ICU under neurology.  CRITICAL CARE  Performed by: Davonna Belling Total critical care time: 30 minutes Critical care time was exclusive of separately billable procedures and treating other patients. Critical care was necessary to treat or prevent imminent or life-threatening deterioration. Critical care was time spent personally by me on the following activities: development of treatment plan with patient and/or surrogate as well as nursing, discussions with consultants, evaluation of patient's response to treatment, examination of patient, obtaining history from patient or surrogate, ordering and performing treatments and interventions, ordering and review of laboratory studies, ordering and review of radiographic studies, pulse oximetry and re-evaluation of patient's condition.  Final Clinical Impression(s) / ED Diagnoses Final diagnoses:  Brain aneurysm    Rx / DC Orders ED Discharge Orders    None       Davonna Belling, MD 08/06/19 1321    Davonna Belling, MD 08/15/19 [phone]

## 2019-08-06 NOTE — Consult Note (Signed)
Chief Complaint: Patient was seen in consultation today for ruptured left MCA bifurcation aneurysm/embolization.  Referring Physician(s): Jaffe,Adam R  Supervising Physician: Pedro Earls  Patient Status: [facility]  History of Present Illness: Victor Pena is a 64 y.o. male with a past medical history of chronic back pain and intermittent exotropia of left eye. [name] known to [facility] and has been followed by Dr. Karenann Cai since 07/2019. He first presented to our department at the request of Dr. Tomi Likens for management of incidental findings of multiple brain aneurysms (left MCA bifurcation, left ICA ophthalmic, and bilateral ICA paraclinoid aneurysms), seen on MRI/MRA brain/head 07/10/2019 during work-up for intermittent left eye exotropia. 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 Patient was scheduled for embolization of this aneurysm tentatively for tomorrow 08/07/2019 in IR with Dr. Karenann Cai. He presented to [facility] ED this AM with complaints of dysphasia and right hand numbness/tingling. Code Stroke was activated and CT head revealed an acute subarachnoid hemorrhage thought to be secondary to ruptured left MCA bifurcation aneurysm. Neurology was consulted and patient was admitted for further management.  CT head 08/06/2019: 1. Moderate volume acute subarachnoid hemorrhage greatest along the left frontal lobe convexity and within the left sylvian fissure. 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 No hydrocephalus. 2. No acute demarcated cortical infarct.  PA at bedside to assess patient in ED alongside Dr. Karenann Cai. Neurology also at bedside. Patient awake and alert sitting in bed. Accompanied by wife at bedside. Complains  of headache, rated 5/10 at this time. Denies N/V associated with headache. Denies fever, chills, chest pain, dyspnea, or abdominal pain.  Currently taking Brilinta 90 mg once daily and Aspirin 81 mg once daily- LD this AM.   Past Medical History:  Diagnosis Date  . Diplopia   . Intracranial aneurysm     Past Surgical History:  Procedure Laterality Date  . BACK SURGERY    . IR 3D INDEPENDENT WKST  07/19/2019  . IR 3D INDEPENDENT WKST  07/19/2019  . IR ANGIO EXTERNAL CAROTID SEL EXT CAROTID BILAT MOD SED  07/19/2019  . IR ANGIO INTRA EXTRACRAN SEL INTERNAL CAROTID BILAT MOD SED  07/19/2019  . IR ANGIO VERTEBRAL SEL VERTEBRAL BILAT MOD SED  07/19/2019  . IR US GUIDE VASC ACCESS RIGHT  07/19/2019    Allergies: Other  Medications: Prior to Admission medications   Medication Sig Start Date End Date Taking? Authorizing Provider  aspirin 81 MG EC tablet Take 81 mg by mouth daily. Swallow whole.    [provider]  diazepam (VALIUM) 5 MG tablet Take 1 tablet 30 mins prior to MRI. May take second dose if needed. Patient not taking: Reported on 07/17/2019 07/02/19   [name], MD  ibuprofen (ADVIL) 200 MG tablet Take 400 mg by mouth every 6 (six) hours as needed for moderate pain.    [provider]  ticagrelor (BRILINTA) 90 MG TABS tablet Take 90 mg by mouth 2 (two) times daily.    [provider]     Family History  Problem Relation Age of Onset  . Aneurysm Father     Social History   Socioeconomic History  . Marital status: Married    Spouse name: Not on file  . Number of children: 4  .  Years of education: 78  . Highest education level: Not on file  Occupational History  . Occupation: owner   Tobacco Use  . Smoking status: Current Every Day Smoker  . Smokeless tobacco: Never Used  Substance and Sexual Activity  . Alcohol use: Yes    Alcohol/week: 10.0 standard drinks    Types: 10 Glasses of wine per week    Comment: one to two glasses of night   . Drug use: Not on file  . Sexual activity: Not on file  Other Topics Concern  . Not on file  Social History Narrative   Right handed   3 story home   Drinks caffeine   Social Determinants of Health   Financial Resource Strain:   . Difficulty of Paying Living Expenses:   Food Insecurity:   . Worried About Charity fundraiser in the Last Year:   . Arboriculturist in the Last Year:   Transportation Needs:   . Film/video editor (Medical):   Marland Kitchen Lack of Transportation (Non-Medical):   Physical Activity:   . Days of Exercise per Week:   . Minutes of Exercise per Session:   Stress:   . Feeling of Stress :   Social Connections:   . Frequency of Communication with Friends and Family:   . Frequency of Social Gatherings with Friends and Family:   . Attends Religious Services:   . Active Member of Clubs or Organizations:   . Attends Archivist Meetings:   Marland Kitchen Marital Status:      Review of Systems: A 12 point ROS discussed and pertinent positives are indicated in the HPI above.  All other systems are negative.  Review of Systems  Constitutional: Negative for chills and fever.  Respiratory: Negative for shortness of breath and wheezing.   Cardiovascular: Negative for chest pain and palpitations.  Gastrointestinal: Negative for abdominal pain, nausea and vomiting.  Neurological: Positive for headaches.  Psychiatric/Behavioral: Negative for behavioral problems and confusion.    Vital Signs: BP 134/89   Pulse 81   Temp 98.4 F (36.9 C) (Oral)   Resp 16   Ht 5' 11"  (1.803 m)   Wt 190 lb (86.2 kg)   SpO2 100%   BMI 26.50 kg/m   Physical Exam Vitals and nursing note reviewed.  Constitutional:      General: [name] not in acute distress.    Appearance: Normal appearance.  Cardiovascular:     Rate and Rhythm: Normal rate and regular rhythm.     Heart sounds: Normal heart sounds. No murmur.  Pulmonary:     Effort: Pulmonary effort is normal. No respiratory  distress.     Breath sounds: Normal breath sounds. No wheezing.  Skin:    General: Skin is warm and dry.  Neurological:     Mental Status: [name] alert and oriented to person, place, and time.      MD Evaluation Airway: WNL Heart: WNL Abdomen: WNL Chest/ Lungs: WNL ASA  Classification: 2 Mallampati/Airway Score: Two   Imaging: MR ANGIO HEAD WO CONTRAST  Result Date: 07/10/2019 CLINICAL DATA:  Intermittent in exotropia of left eye. EXAM: MRI HEAD WITHOUT CONTRAST MRA HEAD WITHOUT CONTRAST TECHNIQUE: Multiplanar, multiecho pulse sequences of the brain and surrounding structures were obtained without intravenous contrast. Angiographic images of the head were obtained using MRA technique without contrast. COMPARISON:  None. FINDINGS: MRI HEAD FINDINGS Brain: No acute infarction, hemorrhage, hydrocephalus, extra-axial collection or mass lesion. Vascular: Normal flow voids.  See detailed MRA report below. Skull and upper cervical spine: Normal marrow signal. Sinuses/Orbits: Minimal mucosal thickening of the ethmoid cells. The orbits are maintained. MRA HEAD FINDINGS Visualized upper cervical internal carotid arteries have normal flow related enhancement. A 3 mm outpouching projecting medially from the paraclinoid right ICA concerning for possible superior hypophyseal artery aneurysm versus infundibulum. There is also an outpouching from the undersurface of the supraclinoid right ICA measuring 3 x 1.9 mm. A 2 mm outpouching from the supraclinoid left ICA may represent an infundibulum at the origin of the left posterior communicating artery versus a small aneurysm. Flow related enhancement is noted in the right cavernous sinus question artifact versus carotid cavernous sinus fistula. 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 The right MCA and the bilateral ACA vascular trees have normal caliber and flow related enhancement without evidence of aneurysm. The intracranial vertebral  arteries, basilar artery and bilateral posterior cerebral arteries have normal course and caliber with normal flow related enhancement, without evidence of aneurysm. IMPRESSION: 1. No acute intracranial abnormality. 2. 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 3. A 3 mm outpouching medially projecting from the paraclinoid right ICA may represent a superior hypophyseal artery aneurysm versus infundibulum. A 3 mm outpouching from the undersurface of the supraclinoid right ICA concerning for possible paraclinoid aneurysm. 4. A 2 mm outpouching from the undersurface of the supraclinoid left ICA may represent an infundibulum at the origin of the left posterior communicating artery versus a small aneurysm. 5. Flow related enhancement in the right cavernous sinus question artifact versus carotid cavernous sinus fistula. These results were called by telephone at the time of interpretation on 07/10/2019 at 11:31 am to provider ADAM JAFFE , who verbally acknowledged these results. Electronically Signed   By: Pedro Earls M.D.   On: 07/10/2019 11:31   MR BRAIN WO CONTRAST  Result Date: 07/10/2019 CLINICAL DATA:  Intermittent in exotropia of left eye. EXAM: MRI HEAD WITHOUT CONTRAST MRA HEAD WITHOUT CONTRAST TECHNIQUE: Multiplanar, multiecho pulse sequences of the brain and surrounding structures were obtained without intravenous contrast. Angiographic images of the head were obtained using MRA technique without contrast. COMPARISON:  None. FINDINGS: MRI HEAD FINDINGS Brain: No acute infarction, hemorrhage, hydrocephalus, extra-axial collection or mass lesion. Vascular: Normal flow voids. See detailed MRA report below. Skull and upper cervical spine: Normal marrow signal. Sinuses/Orbits: Minimal mucosal thickening of the ethmoid cells. The orbits are maintained. MRA HEAD FINDINGS Visualized upper cervical internal carotid arteries have normal flow related enhancement. A 3 mm outpouching projecting  medially from the paraclinoid right ICA concerning for possible superior hypophyseal artery aneurysm versus infundibulum. There is also an outpouching from the undersurface of the supraclinoid right ICA measuring 3 x 1.9 mm. A 2 mm outpouching from the supraclinoid left ICA may represent an infundibulum at the origin of the left posterior communicating artery versus a small aneurysm. Flow related enhancement is noted in the right cavernous sinus question artifact versus carotid cavernous sinus fistula. 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 The right MCA and the bilateral ACA vascular trees have normal caliber and flow related enhancement without evidence of aneurysm. The intracranial vertebral arteries, basilar artery and bilateral posterior cerebral arteries have normal course and caliber with normal flow related enhancement, without evidence of aneurysm. IMPRESSION: 1. No acute intracranial abnormality. 2. 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 3. A 3 mm outpouching medially  projecting from the paraclinoid right ICA may represent a superior hypophyseal artery aneurysm versus infundibulum. A 3 mm outpouching from the undersurface of the supraclinoid right ICA concerning for possible paraclinoid aneurysm. 4. A 2 mm outpouching from the undersurface of the supraclinoid left ICA may represent an infundibulum at the origin of the left posterior communicating artery versus a small aneurysm. 5. Flow related enhancement in the right cavernous sinus question artifact versus carotid cavernous sinus fistula. These results were called by telephone at the time of interpretation on 07/10/2019 at 11:31 am to provider ADAM JAFFE , who verbally acknowledged these results. Electronically Signed   By: Pedro Earls M.D.   On: 07/10/2019 11:31   IR 3D Independent Darreld Mclean  Result Date: 07/22/2019 INDICATION: KINDRED REIDINGER is a 64 year old male with a past medical history of  chronic back pain and intermittent exotropia of left eye. He underwent an MRI and MRA of the brain that showed incidental 5 mm left MCA aneurysm, a 3 mm left ICA ophthalmic segment aneurysm, and bilateral ICA 54m outpouching concerning for possible aneurysm versus infundibulum. Additionally, mild flow related enhancement was seen in the right cavernous sinus concerning for d-AV fistula versus artifact. His family history significant for father death secondary to ruptured brain aneurysm. She comes to our service for diagnostic cerebral angiogram. EXAM: Ultrasound-guided vascular access Diagnostic cerebral angiogram 3D rotational angiogram COMPARISON:  MR angiogram July 10 2019 MEDICATIONS: No antibiotics administered. ANESTHESIA/SEDATION: Versed 1 mg IV; Fentanyl 50 mcg IV Moderate Sedation Time:  1 hour The patient was continuously monitored during the procedure by the interventional radiology nurse under my direct supervision. CONTRAST:  110 mL of Omnipaque 240 FLUOROSCOPY TIME:  Fluoroscopy Time: 11 minutes 30 seconds (594 mGy). COMPLICATIONS: None immediate. TECHNIQUE: Informed written consent was obtained from the patient after a thorough discussion of the procedural risks, benefits and alternatives. All questions were addressed. Maximal Sterile Barrier Technique was utilized including caps, mask, sterile gowns, sterile gloves, sterile drape, hand hygiene and skin antiseptic. A timeout was performed prior to the initiation of the procedure. Real-time ultrasound guidance was utilized for vascular access including the acquisition of a permanent ultrasound image documenting patency of the accessed vessel. Using the modified Seldinger technique and a micropuncture kit, access was gained to the distal right radial artery at the anatomical snuffbox and a 5 French sheath was placed. Slow intra arterial infusion of 5,000 IU heparin and 3579mcg nitroglicerin diluted in patient's own blood was performed. No significant  fluctuation in patient's blood pressure seen. Then, a right radial artery roadmap was obtained via sheath side port. Normal brachial artery branching pattern seen. No significant anatomical variation. The right radial artery caliber is adequate for vascular access. Then, a 5 [place] 2 glide catheter was navigated over a 0.035 inch Terumo Glidewire into the right subclavian artery under fluoroscopy. The catheter was then advanced to the descending aorta and the catheter tip performed with. PICC catheter was then placed at in the left subclavian artery. Frontal roadmap was obtained. Under fluoroscopy, the catheter was advanced over the wire into the left vertebral artery. Townes and lateral views of the head were obtained. The catheter was then placed in the left common carotid artery. Frontal and lateral angiograms of the neck were obtained. The catheter was then advanced into the left external carotid artery. Frontal and lateral angiograms of the head were obtained. The catheter was retracted and then placed into the left internal carotid artery.  Frontal and lateral angiograms of the head were obtained. 3D rotational angiograms were then acquired and post processed in a separate workstation under concurrent attending physician supervision. Selected images were sent to PACS. The catheter was pulled back and subsequently placed into the right vertebral artery. Townes and lateral angiograms of the head were obtained. The catheter was then placed into the right common carotid artery. Frontal and lateral views of the neck were obtained. Next, the catheter was placed into the right internal carotid artery. Frontal and lateral angiograms of the head were obtained. 3D rotational angiograms were acquired and post processed in a separate workstation under concurrent attending physician supervision. Selected images were sent to PACS. Subsequently, the catheter was retracted and placed into right external carotid artery.  Frontal and lateral angiograms of the head were obtained. The catheter was subsequently withdrawn. The radial sheath was removed and an inflatable band was placed and inflated over the access site. The band was slowly deflated until brisk flow was noted and subsequently reinflated with 2 cc of the air for patent hemostasis. FINDINGS: Left vertebral artery angiograms: The visualized upper cervical and intracranial segments of the left vertebral artery have normal course and caliber. The basilar artery in the bilateral posterior cerebral arteries are unremarkable. Luminal caliber is smooth and tapering. No aneurysm or vascular malformation identified. No early draining vein or area of hypervascularity. Major veins and venous sinuses are patent. Left common carotid artery angiogram: Mild atherosclerotic changes are noted in the left carotid bulb without hemodynamically significant stenosis. Left external carotid artery angiograms: Cranial branches of the left ECA are unremarkable. No evidence of area of abnormal hypervascularity or fistula. Left internal carotid artery angiograms: Luminal irregularity of the cavernous and supraclinoid segments of the left ICA suggestive of atherosclerotic disease with approximately 40% stenosis at the proximal cavernous and 30% stenosis at the supraclinoid segment. 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 An infundibulum is noted at the origin of the posterior communicating artery. Brisk contrast opacification is noted of the left ACA and MCA vascular trees. Flash filling of the right ACA is noted via the anterior communicating artery with rapid washout. No vascular malformation, early draining vein or area of abnormal hypervascularity noted. Major veins and venous sinuses are patent. Right vertebral artery angiograms: The visualized upper cervical and intracranial segments of the right  vertebral artery have normal course and caliber. The basilar artery in the bilateral posterior cerebral arteries are unremarkable. Luminal caliber is smooth and tapering. No aneurysm or vascular malformation identified. No early draining vein or area of abnormal hypervascularity. Major veins and venous sinuses are patent. Right common carotid artery angiogram: Mild atherosclerotic changes are noted in the right carotid bulb without hemodynamically significant stenosis. Right internal carotid artery angiogram: Luminal irregularity of the cavernous segment of the right ICA is noted, likely related to atherosclerotic disease, without hemodynamically significant stenosis. The a blister-like aneurysm in the paraophthalmic segment with associated saccular component is noted. The aneurysm measures 5.6 mm in the largest transverse dimension. Brisk contrast opacification is noted of the right ACA and MCA vascular trees. No vascular malformation, early draining vein or area of abnormal hypervascularity noted. Major veins and venous sinuses are patent. Right external carotid artery angiograms: Cranial branches of the right ECA are unremarkable. No evidence of area of abnormal hypervascularity or fistula. PROCEDURE: N/A. IMPRESSION: 1. 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 The aneurysm measures 5.6 mm in largest dimension. 2. 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 3. 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 4. Minimal atherosclerotic disease in the bilateral carotid bifurcation. 5. No evidence of a carotid cavernous sinus fistula. PLAN: Patient will return for consultation to discuss options for aneurysm management. Electronically Signed   By: Pedro Earls M.D.   On: 07/22/2019 13:17   IR 3D  Independent Darreld Mclean  Result Date: 07/22/2019 INDICATION: BOHDEN DUNG is a 64 year old male with a past medical history of chronic back pain and intermittent exotropia of left eye. He underwent an MRI and MRA of the brain that showed incidental 5 mm left MCA aneurysm, a 3 mm left ICA ophthalmic segment aneurysm, and bilateral ICA 52m outpouching concerning for possible aneurysm versus infundibulum. Additionally, mild flow related enhancement was seen in the right cavernous sinus concerning for d-AV fistula versus artifact. His family history significant for father death secondary to ruptured brain aneurysm. She comes to our service for diagnostic cerebral angiogram. EXAM: Ultrasound-guided vascular access Diagnostic cerebral angiogram 3D rotational angiogram COMPARISON:  MR angiogram July 10 2019 MEDICATIONS: No antibiotics administered. ANESTHESIA/SEDATION: Versed 1 mg IV; Fentanyl 50 mcg IV Moderate Sedation Time:  1 hour The patient was continuously monitored during the procedure by the interventional radiology nurse under my direct supervision. CONTRAST:  110 mL of Omnipaque 240 FLUOROSCOPY TIME:  Fluoroscopy Time: 11 minutes 30 seconds (594 mGy). COMPLICATIONS: None immediate. TECHNIQUE: Informed written consent was obtained from the patient after a thorough discussion of the procedural risks, benefits and alternatives. All questions were addressed. Maximal Sterile Barrier Technique was utilized including caps, mask, sterile gowns, sterile gloves, sterile drape, hand hygiene and skin antiseptic. A timeout was performed prior to the initiation of the procedure. Real-time ultrasound guidance was utilized for vascular access including the acquisition of a permanent ultrasound image documenting patency of the accessed vessel. Using the modified Seldinger technique and a micropuncture kit, access was gained to the distal right radial artery at the anatomical snuffbox and a 5 French sheath was placed. Slow intra  arterial infusion of 5,000 IU heparin and 3824mcg nitroglicerin diluted in patient's own blood was performed. No significant fluctuation in patient's blood pressure seen. Then, a right radial artery roadmap was obtained via sheath side port. Normal brachial artery branching pattern seen. No significant anatomical variation. The right radial artery caliber is adequate for vascular access. Then, a 5 [place] 2 glide catheter was navigated over a 0.035 inch Terumo Glidewire into the right subclavian artery under fluoroscopy. The catheter was then advanced to the descending aorta and the catheter tip performed with. PICC catheter was then placed at in the left subclavian artery. Frontal roadmap was obtained. Under fluoroscopy, the catheter was advanced over the wire into the left vertebral artery. Townes and lateral views of the head were obtained. The catheter was then placed in the left common carotid artery. Frontal and lateral angiograms of the neck were obtained. The catheter was then advanced into the left external carotid artery. Frontal and lateral angiograms of the head were obtained. The catheter was retracted and then placed into the left internal carotid artery. Frontal and lateral angiograms of the head were obtained. 3D rotational angiograms were then acquired and post processed in a separate workstation under concurrent attending  physician supervision. Selected images were sent to PACS. The catheter was pulled back and subsequently placed into the right vertebral artery. Townes and lateral angiograms of the head were obtained. The catheter was then placed into the right common carotid artery. Frontal and lateral views of the neck were obtained. Next, the catheter was placed into the right internal carotid artery. Frontal and lateral angiograms of the head were obtained. 3D rotational angiograms were acquired and post processed in a separate workstation under concurrent attending physician  supervision. Selected images were sent to PACS. Subsequently, the catheter was retracted and placed into right external carotid artery. Frontal and lateral angiograms of the head were obtained. The catheter was subsequently withdrawn. The radial sheath was removed and an inflatable band was placed and inflated over the access site. The band was slowly deflated until brisk flow was noted and subsequently reinflated with 2 cc of the air for patent hemostasis. FINDINGS: Left vertebral artery angiograms: The visualized upper cervical and intracranial segments of the left vertebral artery have normal course and caliber. The basilar artery in the bilateral posterior cerebral arteries are unremarkable. Luminal caliber is smooth and tapering. No aneurysm or vascular malformation identified. No early draining vein or area of hypervascularity. Major veins and venous sinuses are patent. Left common carotid artery angiogram: Mild atherosclerotic changes are noted in the left carotid bulb without hemodynamically significant stenosis. Left external carotid artery angiograms: Cranial branches of the left ECA are unremarkable. No evidence of area of abnormal hypervascularity or fistula. Left internal carotid artery angiograms: Luminal irregularity of the cavernous and supraclinoid segments of the left ICA suggestive of atherosclerotic disease with approximately 40% stenosis at the proximal cavernous and 30% stenosis at the supraclinoid segment. 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 An infundibulum is noted at the origin of the posterior communicating artery. Brisk contrast opacification is noted of the left ACA and MCA vascular trees. Flash filling of the right ACA is noted via the anterior communicating artery with rapid washout. No vascular malformation, early draining vein or area of abnormal hypervascularity noted. Major veins and  venous sinuses are patent. Right vertebral artery angiograms: The visualized upper cervical and intracranial segments of the right vertebral artery have normal course and caliber. The basilar artery in the bilateral posterior cerebral arteries are unremarkable. Luminal caliber is smooth and tapering. No aneurysm or vascular malformation identified. No early draining vein or area of abnormal hypervascularity. Major veins and venous sinuses are patent. Right common carotid artery angiogram: Mild atherosclerotic changes are noted in the right carotid bulb without hemodynamically significant stenosis. Right internal carotid artery angiogram: Luminal irregularity of the cavernous segment of the right ICA is noted, likely related to atherosclerotic disease, without hemodynamically significant stenosis. The a blister-like aneurysm in the paraophthalmic segment with associated saccular component is noted. The aneurysm measures 5.6 mm in the largest transverse dimension. Brisk contrast opacification is noted of the right ACA and MCA vascular trees. No vascular malformation, early draining vein or area of abnormal hypervascularity noted. Major veins and venous sinuses are patent. Right external carotid artery angiograms: Cranial branches of the right ECA are unremarkable. No evidence of area of abnormal hypervascularity or fistula. PROCEDURE: N/A. IMPRESSION: 1. 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 The aneurysm measures 5.6 mm in largest dimension. 2. 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 3. 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 4. Minimal atherosclerotic disease in the bilateral carotid bifurcation. 5. No evidence of a carotid cavernous sinus fistula. PLAN: Patient will return for consultation to discuss  options for aneurysm management. Electronically Signed   By: Pedro Earls M.D.   On: 07/22/2019 13:17   IR US Guide Vasc Access Right  Result Date: 07/22/2019 INDICATION: DEMICO PLOCH is a 64 year old male with a past medical history of chronic back pain and intermittent exotropia of left eye. 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 Additionally, mild flow related enhancement was seen in the right cavernous sinus concerning for d-AV fistula versus artifact. His family history significant for father death secondary to ruptured brain aneurysm. She comes to our service for diagnostic cerebral angiogram. EXAM: Ultrasound-guided vascular access Diagnostic cerebral angiogram 3D rotational angiogram COMPARISON:  MR angiogram July 10 2019 MEDICATIONS: No antibiotics administered. ANESTHESIA/SEDATION: Versed 1 mg IV; Fentanyl 50 mcg IV Moderate Sedation Time:  1 hour The patient was continuously monitored during the procedure by the interventional radiology nurse under my direct supervision. CONTRAST:  110 mL of Omnipaque 240 FLUOROSCOPY TIME:  Fluoroscopy Time: 11 minutes 30 seconds (594 mGy). COMPLICATIONS: None immediate. TECHNIQUE: Informed written consent was obtained from the patient after a thorough discussion of the procedural risks, benefits and alternatives. All questions were addressed. Maximal Sterile Barrier Technique was utilized including caps, mask, sterile gowns, sterile gloves, sterile drape, hand hygiene and skin antiseptic. A timeout was performed prior to the initiation of the procedure. Real-time ultrasound guidance was utilized for vascular access including the acquisition of a permanent ultrasound image documenting patency of the accessed vessel. Using the modified Seldinger technique and a  micropuncture kit, access was gained to the distal right radial artery at the anatomical snuffbox and a 5 French sheath was placed. Slow intra arterial infusion of 5,000 IU heparin and 3488mcg nitroglicerin diluted in patient's own blood was performed. No significant fluctuation in patient's blood pressure seen. Then, a right radial artery roadmap was obtained via sheath side port. Normal brachial artery branching pattern seen. No significant anatomical variation. The right radial artery caliber is adequate for vascular access. Then, a 5 [place] 2 glide catheter was navigated over a 0.035 inch Terumo Glidewire into the right subclavian artery under fluoroscopy. The catheter was then advanced to the descending aorta and the catheter tip performed with. PICC catheter was then placed at in the left subclavian artery. Frontal roadmap was obtained. Under fluoroscopy, the catheter was advanced over the wire into the left vertebral artery. Townes and lateral views of the head were obtained. The catheter was then placed in the left common carotid artery. Frontal and lateral angiograms of the neck were obtained. The catheter was then advanced into the left external carotid artery. Frontal and lateral angiograms of the head were obtained. The catheter was retracted and then placed into the left internal carotid artery. Frontal and lateral angiograms of the head were obtained. 3D rotational angiograms were then acquired and post processed in a separate workstation under concurrent attending physician supervision. Selected images were sent to PACS. The catheter was pulled back and subsequently placed into the right vertebral artery. TMadaline Guthrieand  lateral angiograms of the head were obtained. The catheter was then placed into the right common carotid artery. Frontal and lateral views of the neck were obtained. Next, the catheter was placed into the right internal carotid artery. Frontal and lateral angiograms of the head  were obtained. 3D rotational angiograms were acquired and post processed in a separate workstation under concurrent attending physician supervision. Selected images were sent to PACS. Subsequently, the catheter was retracted and placed into right external carotid artery. Frontal and lateral angiograms of the head were obtained. The catheter was subsequently withdrawn. The radial sheath was removed and an inflatable band was placed and inflated over the access site. The band was slowly deflated until brisk flow was noted and subsequently reinflated with 2 cc of the air for patent hemostasis. FINDINGS: Left vertebral artery angiograms: The visualized upper cervical and intracranial segments of the left vertebral artery have normal course and caliber. The basilar artery in the bilateral posterior cerebral arteries are unremarkable. Luminal caliber is smooth and tapering. No aneurysm or vascular malformation identified. No early draining vein or area of hypervascularity. Major veins and venous sinuses are patent. Left common carotid artery angiogram: Mild atherosclerotic changes are noted in the left carotid bulb without hemodynamically significant stenosis. Left external carotid artery angiograms: Cranial branches of the left ECA are unremarkable. No evidence of area of abnormal hypervascularity or fistula. Left internal carotid artery angiograms: Luminal irregularity of the cavernous and supraclinoid segments of the left ICA suggestive of atherosclerotic disease with approximately 40% stenosis at the proximal cavernous and 30% stenosis at the supraclinoid segment. 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 An infundibulum is noted at the origin of the posterior communicating artery. Brisk contrast opacification is noted of the left ACA and MCA vascular trees. Flash filling of the right ACA is noted via the anterior  communicating artery with rapid washout. No vascular malformation, early draining vein or area of abnormal hypervascularity noted. Major veins and venous sinuses are patent. Right vertebral artery angiograms: The visualized upper cervical and intracranial segments of the right vertebral artery have normal course and caliber. The basilar artery in the bilateral posterior cerebral arteries are unremarkable. Luminal caliber is smooth and tapering. No aneurysm or vascular malformation identified. No early draining vein or area of abnormal hypervascularity. Major veins and venous sinuses are patent. Right common carotid artery angiogram: Mild atherosclerotic changes are noted in the right carotid bulb without hemodynamically significant stenosis. Right internal carotid artery angiogram: Luminal irregularity of the cavernous segment of the right ICA is noted, likely related to atherosclerotic disease, without hemodynamically significant stenosis. The a blister-like aneurysm in the paraophthalmic segment with associated saccular component is noted. The aneurysm measures 5.6 mm in the largest transverse dimension. Brisk contrast opacification is noted of the right ACA and MCA vascular trees. No vascular malformation, early draining vein or area of abnormal hypervascularity noted. Major veins and venous sinuses are patent. Right external carotid artery angiograms: Cranial branches of the right ECA are unremarkable. No evidence of area of abnormal hypervascularity or fistula. PROCEDURE: N/A. IMPRESSION: 1. 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 The aneurysm measures 5.6 mm in largest dimension. 2. 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 3. 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 4. Minimal atherosclerotic disease in  the bilateral carotid bifurcation. 5. No evidence of a carotid cavernous sinus fistula. PLAN: Patient will return for consultation to discuss options for aneurysm management. Electronically Signed   By: Pedro Earls M.D.   On: 07/22/2019 13:17   Chest Port 1 View  Result Date: 08/06/2019 CLINICAL DATA:  64 year old male code stroke presentation with multiple intracranial aneurysms on MRA/conventional cerebral angiogram and presenting with aneurysmal type subarachnoid hemorrhage today. EXAM: PORTABLE CHEST 1 VIEW COMPARISON:  None. FINDINGS: Portable AP upright view at 1320 hours. Lung volumes and mediastinal contours are within normal limits. Visualized tracheal air column is within normal limits. Allowing for portable technique the lungs are clear. No pneumothorax. No acute osseous abnormality identified. IMPRESSION: No acute cardiopulmonary abnormality. Electronically Signed   By: Genevie Ann M.D.   On: 08/06/2019 13:28   CT HEAD CODE STROKE WO CONTRAST  Addendum Date: 08/06/2019   ADDENDUM REPORT: 08/06/2019 12:51 ADDENDUM: These results were called by telephone at the time of interpretation on 08/06/2019 at 12:51 pm to provider Dr. Cheral Marker, who verbally acknowledged these results. Electronically Signed   By: Kellie Simmering DO   On: 08/06/2019 12:51   Result Date: 08/06/2019 CLINICAL DATA:  Code stroke. Neuro deficit, acute, stroke suspected. Difficulty speaking. EXAM: CT HEAD WITHOUT CONTRAST TECHNIQUE: Contiguous axial images were obtained from the base of the skull through the vertex without intravenous contrast. COMPARISON:  MRI/MRA head 07/10/2019. FINDINGS: Brain: There is moderate volume acute subareolar hemorrhage greatest along the left frontal lobe convexity and within the left sylvian fissure. No acute demarcated cortical infarct is identified. No extra-axial fluid collection. No evidence of intracranial mass. No midline shift. Vascular: No hyperdense vessel. Skull: Normal. Negative for  fracture or focal lesion. Sinuses/Orbits: Visualized orbits show no acute finding. Mild ethmoid sinus mucosal thickening. No significant mastoid effusion. IMPRESSION: Moderate volume acute subarachnoid hemorrhage greatest along the left frontal lobe convexity and within the left sylvian fissure. 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 No hydrocephalus. No acute demarcated cortical infarct. Electronically Signed: By: Kellie Simmering DO On: 08/06/2019 12:39   IR ANGIO INTRA EXTRACRAN SEL INTERNAL CAROTID BILAT MOD SED  Result Date: 07/22/2019 INDICATION: DARA CAMARGO is a 64 year old male with a past medical history of chronic back pain and intermittent exotropia of left eye. 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 Additionally, mild flow related enhancement was seen in the right cavernous sinus concerning for d-AV fistula versus artifact. His family history significant for father death secondary to ruptured brain aneurysm. She comes to our service for diagnostic cerebral angiogram. EXAM: Ultrasound-guided vascular access Diagnostic cerebral angiogram 3D rotational angiogram COMPARISON:  MR angiogram July 10 2019 MEDICATIONS: No antibiotics administered. ANESTHESIA/SEDATION: Versed 1 mg IV; Fentanyl 50 mcg IV Moderate Sedation Time:  1 hour The patient was continuously monitored during the procedure by the interventional radiology nurse under my direct supervision. CONTRAST:  110 mL of Omnipaque 240 FLUOROSCOPY TIME:  Fluoroscopy Time: 11 minutes 30 seconds (594 mGy). COMPLICATIONS: None immediate. TECHNIQUE: Informed written consent was obtained from the patient after a thorough discussion of the  procedural risks, benefits and alternatives. All questions were addressed. Maximal Sterile  Barrier Technique was utilized including caps, mask, sterile gowns, sterile gloves, sterile drape, hand hygiene and skin antiseptic. A timeout was performed prior to the initiation of the procedure. Real-time ultrasound guidance was utilized for vascular access including the acquisition of a permanent ultrasound image documenting patency of the accessed vessel. Using the modified Seldinger technique and a micropuncture kit, access was gained to the distal right radial artery at the anatomical snuffbox and a 5 French sheath was placed. Slow intra arterial infusion of 5,000 IU heparin and 638 mcg nitroglicerin diluted in patient's own blood was performed. No significant fluctuation in patient's blood pressure seen. Then, a right radial artery roadmap was obtained via sheath side port. Normal brachial artery branching pattern seen. No significant anatomical variation. The right radial artery caliber is adequate for vascular access. Then, a 5 [place] Simmons 2 glide catheter was navigated over a 0.035 inch Terumo Glidewire into the right subclavian artery under fluoroscopy. The catheter was then advanced to the descending aorta and the catheter tip performed with. PICC catheter was then placed at in the left subclavian artery. Frontal roadmap was obtained. Under fluoroscopy, the catheter was advanced over the wire into the left vertebral artery. Townes and lateral views of the head were obtained. The catheter was then placed in the left common carotid artery. Frontal and lateral angiograms of the neck were obtained. The catheter was then advanced into the left external carotid artery. Frontal and lateral angiograms of the head were obtained. The catheter was retracted and then placed into the left internal carotid artery. Frontal and lateral angiograms of the head were obtained. 3D rotational angiograms were then acquired and post  processed in a separate workstation under concurrent attending physician supervision. Selected images were sent to PACS. The catheter was pulled back and subsequently placed into the right vertebral artery. Townes and lateral angiograms of the head were obtained. The catheter was then placed into the right common carotid artery. Frontal and lateral views of the neck were obtained. Next, the catheter was placed into the right internal carotid artery. Frontal and lateral angiograms of the head were obtained. 3D rotational angiograms were acquired and post processed in a separate workstation under concurrent attending physician supervision. Selected images were sent to PACS. Subsequently, the catheter was retracted and placed into right external carotid artery. Frontal and lateral angiograms of the head were obtained. The catheter was subsequently withdrawn. The radial sheath was removed and an inflatable band was placed and inflated over the access site. The band was slowly deflated until brisk flow was noted and subsequently reinflated with 2 cc of the air for patent hemostasis. FINDINGS: Left vertebral artery angiograms: The visualized upper cervical and intracranial segments of the left vertebral artery have normal course and caliber. The basilar artery in the bilateral posterior cerebral arteries are unremarkable. Luminal caliber is smooth and tapering. No aneurysm or vascular malformation identified. No early draining vein or area of hypervascularity. Major veins and venous sinuses are patent. Left common carotid artery angiogram: Mild atherosclerotic changes are noted in the left carotid bulb without hemodynamically significant stenosis. Left external carotid artery angiograms: Cranial branches of the left ECA are unremarkable. No evidence of area of abnormal hypervascularity or fistula. Left internal carotid artery angiograms: Luminal irregularity of the cavernous and supraclinoid segments of the left ICA  suggestive of atherosclerotic disease with approximately 40% stenosis at the proximal cavernous and 30% stenosis at the supraclinoid segment. A wide neck saccular aneurysm is noted at the left  MCA bifurcation measuring approximately 4.4 x 4.2 mm with the neck measuring 4.6 mm. An infundibulum is noted at the origin of the posterior communicating artery. Brisk contrast opacification is noted of the left ACA and MCA vascular trees. Flash filling of the right ACA is noted via the anterior communicating artery with rapid washout. No vascular malformation, early draining vein or area of abnormal hypervascularity noted. Major veins and venous sinuses are patent. Right vertebral artery angiograms: The visualized upper cervical and intracranial segments of the right vertebral artery have normal course and caliber. The basilar artery in the bilateral posterior cerebral arteries are unremarkable. Luminal caliber is smooth and tapering. No aneurysm or vascular malformation identified. No early draining vein or area of abnormal hypervascularity. Major veins and venous sinuses are patent. Right common carotid artery angiogram: Mild atherosclerotic changes are noted in the right carotid bulb without hemodynamically significant stenosis. Right internal carotid artery angiogram: Luminal irregularity of the cavernous segment of the right ICA is noted, likely related to atherosclerotic disease, without hemodynamically significant stenosis. The a blister-like aneurysm in the paraophthalmic segment with associated saccular component is noted. The aneurysm measures 5.6 mm in the largest transverse dimension. Brisk contrast opacification is noted of the right ACA and MCA vascular trees. No vascular malformation, early draining vein or area of abnormal hypervascularity noted. Major veins and venous sinuses are patent. Right external carotid artery angiograms: Cranial branches of the right ECA are unremarkable. No evidence of area of  abnormal hypervascularity or fistula. PROCEDURE: N/A. IMPRESSION: 1. 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 The aneurysm measures 5.6 mm in largest dimension. 2. 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 3. 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 4. Minimal atherosclerotic disease in the bilateral carotid bifurcation. 5. No evidence of a carotid cavernous sinus fistula. PLAN: Patient will return for consultation to discuss options for aneurysm management. Electronically Signed   By: Pedro Earls M.D.   On: 07/22/2019 13:17   IR ANGIO VERTEBRAL SEL VERTEBRAL BILAT MOD SED  Result Date: 07/22/2019 INDICATION: NAZIR HACKER is a 64 year old male with a past medical history of chronic back pain and intermittent exotropia of left eye. 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 Additionally, mild flow related enhancement was seen in the right cavernous sinus concerning for d-AV fistula versus artifact. His family history significant for father death secondary to ruptured brain aneurysm. She comes to our service for diagnostic cerebral angiogram. EXAM: Ultrasound-guided vascular access Diagnostic cerebral angiogram 3D rotational angiogram COMPARISON:  MR angiogram July 10 2019 MEDICATIONS: No antibiotics administered. ANESTHESIA/SEDATION: Versed 1 mg IV; Fentanyl 50 mcg IV Moderate Sedation Time:  1 hour The patient was continuously monitored during the procedure by the interventional radiology nurse under my direct supervision. CONTRAST:  110 mL of Omnipaque 240 FLUOROSCOPY TIME:  Fluoroscopy Time: 11 minutes 30  seconds (594 mGy). COMPLICATIONS: None immediate. TECHNIQUE: Informed written consent was obtained from the patient after a thorough discussion of the procedural risks, benefits and alternatives. All questions were addressed. Maximal Sterile Barrier Technique was utilized including caps, mask, sterile gowns, sterile gloves, sterile drape, hand hygiene and skin antiseptic. A timeout was  performed prior to the initiation of the procedure. Real-time ultrasound guidance was utilized for vascular access including the acquisition of a permanent ultrasound image documenting patency of the accessed vessel. Using the modified Seldinger technique and a micropuncture kit, access was gained to the distal right radial artery at the anatomical snuffbox and a 5 French sheath was placed. Slow intra arterial infusion of 5,000 IU heparin and 856 mcg nitroglicerin diluted in patient's own blood was performed. No significant fluctuation in patient's blood pressure seen. Then, a right radial artery roadmap was obtained via sheath side port. Normal brachial artery branching pattern seen. No significant anatomical variation. The right radial artery caliber is adequate for vascular access. Then, a 5 [place] Simmons 2 glide catheter was navigated over a 0.035 inch Terumo Glidewire into the right subclavian artery under fluoroscopy. The catheter was then advanced to the descending aorta and the catheter tip performed with. PICC catheter was then placed at in the left subclavian artery. Frontal roadmap was obtained. Under fluoroscopy, the catheter was advanced over the wire into the left vertebral artery. Townes and lateral views of the head were obtained. The catheter was then placed in the left common carotid artery. Frontal and lateral angiograms of the neck were obtained. The catheter was then advanced into the left external carotid artery. Frontal and lateral angiograms of the head were obtained. The catheter was retracted and then placed  into the left internal carotid artery. Frontal and lateral angiograms of the head were obtained. 3D rotational angiograms were then acquired and post processed in a separate workstation under concurrent attending physician supervision. Selected images were sent to PACS. The catheter was pulled back and subsequently placed into the right vertebral artery. Townes and lateral angiograms of the head were obtained. The catheter was then placed into the right common carotid artery. Frontal and lateral views of the neck were obtained. Next, the catheter was placed into the right internal carotid artery. Frontal and lateral angiograms of the head were obtained. 3D rotational angiograms were acquired and post processed in a separate workstation under concurrent attending physician supervision. Selected images were sent to PACS. Subsequently, the catheter was retracted and placed into right external carotid artery. Frontal and lateral angiograms of the head were obtained. The catheter was subsequently withdrawn. The radial sheath was removed and an inflatable band was placed and inflated over the access site. The band was slowly deflated until brisk flow was noted and subsequently reinflated with 2 cc of the air for patent hemostasis. FINDINGS: Left vertebral artery angiograms: The visualized upper cervical and intracranial segments of the left vertebral artery have normal course and caliber. The basilar artery in the bilateral posterior cerebral arteries are unremarkable. Luminal caliber is smooth and tapering. No aneurysm or vascular malformation identified. No early draining vein or area of hypervascularity. Major veins and venous sinuses are patent. Left common carotid artery angiogram: Mild atherosclerotic changes are noted in the left carotid bulb without hemodynamically significant stenosis. Left external carotid artery angiograms: Cranial branches of the left ECA are unremarkable. No evidence of area of abnormal  hypervascularity or fistula. Left internal carotid artery angiograms: Luminal irregularity of the cavernous and supraclinoid segments of the left ICA suggestive of atherosclerotic disease with approximately 40% stenosis at the proximal cavernous and 30% stenosis at the supraclinoid segment. 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 An infundibulum is noted at the origin  of the posterior communicating artery. Brisk contrast opacification is noted of the left ACA and MCA vascular trees. Flash filling of the right ACA is noted via the anterior communicating artery with rapid washout. No vascular malformation, early draining vein or area of abnormal hypervascularity noted. Major veins and venous sinuses are patent. Right vertebral artery angiograms: The visualized upper cervical and intracranial segments of the right vertebral artery have normal course and caliber. The basilar artery in the bilateral posterior cerebral arteries are unremarkable. Luminal caliber is smooth and tapering. No aneurysm or vascular malformation identified. No early draining vein or area of abnormal hypervascularity. Major veins and venous sinuses are patent. Right common carotid artery angiogram: Mild atherosclerotic changes are noted in the right carotid bulb without hemodynamically significant stenosis. Right internal carotid artery angiogram: Luminal irregularity of the cavernous segment of the right ICA is noted, likely related to atherosclerotic disease, without hemodynamically significant stenosis. The a blister-like aneurysm in the paraophthalmic segment with associated saccular component is noted. The aneurysm measures 5.6 mm in the largest transverse dimension. Brisk contrast opacification is noted of the right ACA and MCA vascular trees. No vascular malformation, early draining vein or area of abnormal hypervascularity noted. Major veins and  venous sinuses are patent. Right external carotid artery angiograms: Cranial branches of the right ECA are unremarkable. No evidence of area of abnormal hypervascularity or fistula. PROCEDURE: N/A. IMPRESSION: 1. 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 The aneurysm measures 5.6 mm in largest dimension. 2. 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 3. 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 4. Minimal atherosclerotic disease in the bilateral carotid bifurcation. 5. No evidence of a carotid cavernous sinus fistula. PLAN: Patient will return for consultation to discuss options for aneurysm management. Electronically Signed   By: Pedro Earls M.D.   On: 07/22/2019 13:17   IR ANGIO EXTERNAL CAROTID SEL EXT CAROTID BILAT MOD SED  Result Date: 07/22/2019 INDICATION: DAGOBERTO NEALY is a 64 year old male with a past medical history of chronic back pain and intermittent exotropia of left eye. 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 Additionally, mild flow related enhancement was seen in the right cavernous sinus concerning for d-AV fistula versus artifact. His family history significant for father death secondary to ruptured brain aneurysm. She comes to our service for diagnostic cerebral angiogram. EXAM: Ultrasound-guided vascular access Diagnostic cerebral angiogram 3D rotational angiogram COMPARISON:  MR angiogram July 10 2019 MEDICATIONS: No antibiotics administered. ANESTHESIA/SEDATION: Versed 1 mg IV; Fentanyl 50 mcg IV Moderate Sedation Time:  1 hour The patient was continuously monitored during the procedure by the  interventional radiology nurse under my direct supervision. CONTRAST:  110 mL of Omnipaque 240 FLUOROSCOPY TIME:  Fluoroscopy Time: 11 minutes 30 seconds (594 mGy). COMPLICATIONS: None immediate. TECHNIQUE: Informed written consent was obtained from the patient after a thorough discussion of the procedural risks, benefits and alternatives. All questions were addressed. Maximal Sterile Barrier Technique was utilized including caps, mask, sterile gowns, sterile gloves, sterile drape, hand hygiene and skin antiseptic. A timeout was performed prior to the initiation of the procedure. Real-time ultrasound guidance was utilized for vascular access including the acquisition  of a permanent ultrasound image documenting patency of the accessed vessel. Using the modified Seldinger technique and a micropuncture kit, access was gained to the distal right radial artery at the anatomical snuffbox and a 5 French sheath was placed. Slow intra arterial infusion of 5,000 IU heparin and 998 mcg nitroglicerin diluted in patient's own blood was performed. No significant fluctuation in patient's blood pressure seen. Then, a right radial artery roadmap was obtained via sheath side port. Normal brachial artery branching pattern seen. No significant anatomical variation. The right radial artery caliber is adequate for vascular access. Then, a 5 [place] Simmons 2 glide catheter was navigated over a 0.035 inch Terumo Glidewire into the right subclavian artery under fluoroscopy. The catheter was then advanced to the descending aorta and the catheter tip performed with. PICC catheter was then placed at in the left subclavian artery. Frontal roadmap was obtained. Under fluoroscopy, the catheter was advanced over the wire into the left vertebral artery. Townes and lateral views of the head were obtained. The catheter was then placed in the left common carotid artery. Frontal and lateral angiograms of the neck were obtained. The catheter was then  advanced into the left external carotid artery. Frontal and lateral angiograms of the head were obtained. The catheter was retracted and then placed into the left internal carotid artery. Frontal and lateral angiograms of the head were obtained. 3D rotational angiograms were then acquired and post processed in a separate workstation under concurrent attending physician supervision. Selected images were sent to PACS. The catheter was pulled back and subsequently placed into the right vertebral artery. Townes and lateral angiograms of the head were obtained. The catheter was then placed into the right common carotid artery. Frontal and lateral views of the neck were obtained. Next, the catheter was placed into the right internal carotid artery. Frontal and lateral angiograms of the head were obtained. 3D rotational angiograms were acquired and post processed in a separate workstation under concurrent attending physician supervision. Selected images were sent to PACS. Subsequently, the catheter was retracted and placed into right external carotid artery. Frontal and lateral angiograms of the head were obtained. The catheter was subsequently withdrawn. The radial sheath was removed and an inflatable band was placed and inflated over the access site. The band was slowly deflated until brisk flow was noted and subsequently reinflated with 2 cc of the air for patent hemostasis. FINDINGS: Left vertebral artery angiograms: The visualized upper cervical and intracranial segments of the left vertebral artery have normal course and caliber. The basilar artery in the bilateral posterior cerebral arteries are unremarkable. Luminal caliber is smooth and tapering. No aneurysm or vascular malformation identified. No early draining vein or area of hypervascularity. Major veins and venous sinuses are patent. Left common carotid artery angiogram: Mild atherosclerotic changes are noted in the left carotid bulb without hemodynamically  significant stenosis. Left external carotid artery angiograms: Cranial branches of the left ECA are unremarkable. No evidence of area of abnormal hypervascularity or fistula. Left internal carotid artery angiograms: Luminal irregularity of the cavernous and supraclinoid segments of the left ICA suggestive of atherosclerotic disease with approximately 40% stenosis at the proximal cavernous and 30% stenosis at the supraclinoid segment. 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 An infundibulum is noted at the origin of the posterior communicating artery. Brisk contrast opacification is noted of the left ACA and MCA vascular trees. Flash  filling of the right ACA is noted via the anterior communicating artery with rapid washout. No vascular malformation, early draining vein or area of abnormal hypervascularity noted. Major veins and venous sinuses are patent. Right vertebral artery angiograms: The visualized upper cervical and intracranial segments of the right vertebral artery have normal course and caliber. The basilar artery in the bilateral posterior cerebral arteries are unremarkable. Luminal caliber is smooth and tapering. No aneurysm or vascular malformation identified. No early draining vein or area of abnormal hypervascularity. Major veins and venous sinuses are patent. Right common carotid artery angiogram: Mild atherosclerotic changes are noted in the right carotid bulb without hemodynamically significant stenosis. Right internal carotid artery angiogram: Luminal irregularity of the cavernous segment of the right ICA is noted, likely related to atherosclerotic disease, without hemodynamically significant stenosis. The a blister-like aneurysm in the paraophthalmic segment with associated saccular component is noted. The aneurysm measures 5.6 mm in the largest transverse dimension. Brisk contrast opacification is noted  of the right ACA and MCA vascular trees. No vascular malformation, early draining vein or area of abnormal hypervascularity noted. Major veins and venous sinuses are patent. Right external carotid artery angiograms: Cranial branches of the right ECA are unremarkable. No evidence of area of abnormal hypervascularity or fistula. PROCEDURE: N/A. IMPRESSION: 1. 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 The aneurysm measures 5.6 mm in largest dimension. 2. 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 3. 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 4. Minimal atherosclerotic disease in the bilateral carotid bifurcation. 5. No evidence of a carotid cavernous sinus fistula. PLAN: Patient will return for consultation to discuss options for aneurysm management. Electronically Signed   By: Pedro Earls M.D.   On: 07/22/2019 13:17    Labs:  CBC: Recent Labs    07/15/19 1344 07/19/19 0909 08/06/19 1217 08/06/19 1226  WBC 8.9 6.6 7.7  --   HGB 14.7 14.9 13.9 14.6  HCT 43.9 44.0 41.4 43.0  PLT 232 257 264  --     COAGS: Recent Labs    07/15/19 1344 08/06/19 1217  INR 1.0 1.0  APTT 29 27    BMP: Recent Labs    07/15/19 1344 07/19/19 0909 08/06/19 1217 08/06/19 1226  NA 140 142 136 138  K 5.2* 4.4 3.9 3.8  CL 102 104 102 103  CO2 26 22 21*  --   GLUCOSE 107* 97 113* 106*  BUN 17 14 15 17   CALCIUM 9.9 9.7 9.6  --   CREATININE 0.93 0.91 1.01 0.80  GFRNONAA >60 >60 >60  --   GFRAA >60 >60 >60  --     LIVER FUNCTION TESTS: Recent Labs    08/06/19 1217  BILITOT 0.9  AST 49*  ALT 46*  ALKPHOS 60  PROT 7.4  ALBUMIN 4.0     Assessment and Plan:  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 Plan for  image-guided cerebral arteriogram with possible embolization of left MCA bifurcation aneurysm today in IR with Dr. Karenann Cai. Patient has been NPO since 1130 today. Afebrile and WBCs WNL. LD Brilinta/Aspirin this AM- per Dr. Karenann Cai will revisit DAPT following procedure (might need to restart depending on which devices are used during procedure). INR 1.0 today. COVID pending.  Risks and benefits  of cerebral arteriogram with intervention were discussed with the patient including, but not limited to bleeding, infection, vascular injury, contrast induced renal failure, stroke, reperfusion hemorrhage, or even death. This interventional procedure involves the use of X-rays and because of the nature of the planned procedure, it is possible that we will have prolonged use of X-ray fluoroscopy. Potential radiation risks to you include (but are not limited to) the following: - A slightly elevated risk for cancer  several years later in life. This risk is typically less than 0.5% percent. This risk is low in comparison to the normal incidence of human cancer, which is 33% for women and 50% for men according to the [place]. - Radiation induced injury can include skin redness, resembling a rash, tissue breakdown / ulcers and hair loss (which can be temporary or permanent).  The likelihood of either of these occurring depends on the difficulty of the procedure and whether you are sensitive to radiation due to previous procedures, disease, or genetic conditions.  IF your procedure requires a prolonged use of radiation, you will be notified and given written instructions for further action.  It is your responsibility to monitor the irradiated area for the 2 weeks following the procedure and to notify your physician if you are concerned that you have suffered a radiation induced injury.   All of the patient's questions were answered, patient is agreeable to proceed. Consent  signed and in chart.   Thank you for this interesting consult.  I greatly enjoyed meeting KEAGHAN STATON and look forward to participating in their care.  A copy of this report was sent to the requesting provider on this date.  Electronically Signed: Earley Abide, PA-C 08/06/2019, 2:29 PM   I spent a total of 40 Minutes in face to face in clinical consultation, greater than 50% of which was counseling/coordinating care for ruptured left MCA bifurcation aneurysm/embolization.

## 2019-08-06 NOTE — Progress Notes (Signed)
Pt's wife reports pt drinks an estimated 1 bottle of wine/day on average with some vodka consumption + smokes approx a pack of cigarettes a day.

## 2019-08-06 NOTE — Anesthesia Postprocedure Evaluation (Signed)
Anesthesia Post Note  Patient: Victor Pena  Procedure(s) Performed: IR WITH ANESTHESIA (N/A )     Patient location during evaluation: PACU Anesthesia Type: General Level of consciousness: awake and alert Pain management: pain level controlled Vital Signs Assessment: post-procedure vital signs reviewed and stable Respiratory status: spontaneous breathing, nonlabored ventilation, respiratory function stable and patient connected to nasal cannula oxygen Cardiovascular status: blood pressure returned to baseline and stable Postop Assessment: no apparent nausea or vomiting Anesthetic complications: no    Last Vitals:  Vitals:   08/06/19 2115 08/06/19 2130  BP: 119/70 108/67  Pulse: 85 84  Resp: 16 14  Temp:    SpO2: 99% 98%    Last Pain:  Vitals:   08/06/19 2100  TempSrc:   PainSc: 0-No pain                 Shelton Silvas

## 2019-08-06 NOTE — Progress Notes (Signed)
Called patients wife and let her know patient is doing well and will be under the care of Jamie RN on 4N.  VSS and pt is resting comfortably.

## 2019-08-06 NOTE — Progress Notes (Signed)
Patient arrived to Short Stay with 4N RN. Patient alert and oriented x 4. Clevidipine drip maintained at 4 mg/ hr through left AC IV. NSR on monitor without ectopy. 99% on RA. BP as noted.

## 2019-08-06 NOTE — Progress Notes (Signed)
Bedside report to PACU RN/ ICU RN groin pulses checked

## 2019-08-06 NOTE — ED Notes (Signed)
Pt taken to bridge to see mD Pickering for possible code stroke. Pt states at 1115am today he had a return of symptoms that originally started yesterday at 11am where he had difficulty speaking and right hand numbness. Pt states he was to his baseline when he woke up this am but had a reoccurrence of aphasia and hand numbness at 1115 today. Code stroke activated reports given to Trish Mage at bridge.

## 2019-08-06 NOTE — Anesthesia Preprocedure Evaluation (Addendum)
Anesthesia Evaluation  Patient identified by MRN, date of birth, ID band Patient awake    Reviewed: Allergy & Precautions, NPO status , Patient's Chart, lab work & pertinent test results  Airway Mallampati: II  TM Distance: >3 FB Neck ROM: Full    Dental  (+) Dental Advisory Given, Teeth Intact   Pulmonary Current Smoker,    breath sounds clear to auscultation       Cardiovascular negative cardio ROS   Rhythm:Regular Rate:Normal     Neuro/Psych negative neurological ROS  negative psych ROS   GI/Hepatic negative GI ROS, Neg liver ROS,   Endo/Other  negative endocrine ROS  Renal/GU negative Renal ROS     Musculoskeletal negative musculoskeletal ROS (+)   Abdominal   Peds  Hematology negative hematology ROS (+)   Anesthesia Other Findings   Reproductive/Obstetrics                            Anesthesia Physical Anesthesia Plan  ASA: III and emergent  Anesthesia Plan: General   Post-op Pain Management:    Induction: Intravenous, Rapid sequence and Cricoid pressure planned  PONV Risk Score and Plan: 1 and Ondansetron, Dexamethasone and Treatment may vary due to age or medical condition  Airway Management Planned: Oral ETT  Additional Equipment: Arterial line  Intra-op Plan:   Post-operative Plan: Possible Post-op intubation/ventilation  Informed Consent: I have reviewed the patients History and Physical, chart, labs and discussed the procedure including the risks, benefits and alternatives for the proposed anesthesia with the patient or authorized representative who has indicated his/her understanding and acceptance.     Dental advisory given  Plan Discussed with: CRNA  Anesthesia Plan Comments: (Pt had sausage biscuit ~11:30. Dr. Sherlon Handing states this is an emergency. )       Anesthesia Quick Evaluation

## 2019-08-06 NOTE — H&P (Addendum)
Neurology history and physical  Reason for Consult: Code Stroke  Referring Physician: Dr. Rubin Payor  CC: Dysphagia and right hand numbness  History is obtained from: Patient  HPI: Victor Pena is a 64 y.o. male with a history of intracranial aneurysms and diplopia.  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  He underwent a diagnostic cerebral angiogram on 07/19/2019 that showed a 4.4 x 4.2 mm wide neck left MCA bifurcation aneurysm and a blisterlike right ICA bifurcation aneurysm measuring 5.6 x 3 0.1 mm with superimposed pseudolobation.  Patient was scheduled to have an endovascular procedure tomorrow to treat his RIGHT sided aneurysm; however, today the patient was brought to [facility] secondary to having intermittent spells of dysphasia and right hand numbness.  Patient had symptoms that initially started yesterday at 11 AM, which included dysphasia lasting for about 5 minutes.  Later in the afternoon at approximately 5:30 PM he had another 2-minute episode of dysphasia.  This AM he was noticed to be having problems using his right hand and dropping cups; this resolved. At approximately 11 AM he had recurrence of the same symptoms involving his right hand.  His wife also noted that he was complaining of a headache in the left occipital region that is now rated at 5/10.    Patient was brought to the emergency room at [facility] where a Code Stroke was called. He was immediately brought to CT where images revealed subarachnoid blood within several adjacent sulci along the left frontoparietal convexity.    While in the ED the patient's systolic blood pressure was noted to be greater than 140 and patient was started on clevidipine.  Patient has been on aspirin; however, Brilinta was added to his regimen last Wednesday (April 28).  LKW: 11:15 AM on 08/05/2019 tpa given?: no, subarachnoid hemorrhage Premorbid modified Rankin scale (mRS): 0 NIHSS: 1   Past  Medical History:  Diagnosis Date  . Diplopia   . Intracranial aneurysm     Family History  Problem Relation Age of Onset  . Aneurysm Father    Social History:   reports that he has been smoking. He has never used smokeless tobacco. He reports current alcohol use of about 10.0 standard drinks of alcohol per week. No history on file for drug.  Medications  Current Facility-Administered Medications:  .  clevidipine (CLEVIPREX) 0.5 MG/ML infusion, , , ,  .  clevidipine (CLEVIPREX) infusion 0.5 mg/mL, 0-21 mg/hr, Intravenous, Continuous, Pickering, Nathan, MD .  iohexol (OMNIPAQUE) 350 MG/ML injection 100 mL, 100 mL, Intravenous, Once PRN, Benjiman Core, MD .  sodium chloride flush (NS) 0.9 % injection 3 mL, 3 mL, Intravenous, Once, Benjiman Core, MD  Current Outpatient Medications:  .  aspirin 81 MG EC tablet, Take 81 mg by mouth daily. Swallow whole., Disp: , Rfl:  .  diazepam (VALIUM) 5 MG tablet, Take 1 tablet 30 mins prior to MRI. May take second dose if needed. (Patient not taking: Reported on 07/17/2019), Disp: 2 tablet, Rfl: 0 .  ibuprofen (ADVIL) 200 MG tablet, Take 400 mg by mouth every 6 (six) hours as needed for moderate pain., Disp: , Rfl:  .  ticagrelor (BRILINTA) 90 MG TABS tablet, Take 90 mg by mouth 2 (two) times daily., Disp: , Rfl:   ROS:   General ROS: negative for - chills, fatigue, fever, Ophthalmic ROS: negative for - blurry vision, double vision ENT ROS: negative for -  sore throat, tinnitus or vertigo  Allergy and Immunology ROS: negative for - hives or itchy/watery eyes Respiratory ROS: negative for - cough, hemoptysis, shortness of breath or wheezing Cardiovascular ROS: negative for - chest pain, Gastrointestinal ROS: negative for - abdominal pain, diarrhea,  Genito-Urinary ROS: negative for - dysuria, hematuria, incontinence or urinary frequency/urgency Musculoskeletal ROS: Positive for -  muscular weakness Neurological ROS: as noted in  HPI Dermatological ROS: negative for rash and skin lesion changes  Exam: Current vital signs: BP (!) 149/94 (BP Location: Left Arm)   Pulse (!) 101   Temp 98.4 F (36.9 C) (Oral)   Resp 17   Ht 5\' 11"  (1.803 m)   Wt 86.2 kg   SpO2 98%   BMI 26.50 kg/m  Vital signs in last 24 hours: Temp:  [98.4 F (36.9 C)] 98.4 F (36.9 C) (05/04 1213) Pulse Rate:  [101] 101 (05/04 1213) Resp:  [17] 17 (05/04 1213) BP: (149)/(94) 149/94 (05/04 1213) SpO2:  [98 %] 98 % (05/04 1213) Weight:  [86.2 kg] 86.2 kg (05/04 1213)   Constitutional: Appears well-developed and well-nourished.  Psych: Affect appropriate to situation Eyes: No scleral injection HENT: No OP obstrucion Head: Normocephalic.  Cardiovascular: Normal rate and regular rhythm.  Respiratory: Effort normal, non-labored breathing GI: Soft.  No distension. There is no tenderness.  Skin: WDI  Neuro: Mental Status: Patient is awake, alert, oriented to person, place, month, year, and situation. Speech-patient is showing both semantic and phonemic paraphasic errors which are intermittent. Most speech is fluent, but some sentences have a garbled quality. No dysarthria.  Able to name objects. Comprehension intact.  Cranial Nerves: II: Visual Fields are full. PERRL.  III,IV, VI: EOMI without ptosis or diploplia.  V: Facial sensation is symmetric to temperature VII: Facial movement is symmetric.  VIII: Hearing is intact to voice X: Palate elevates symmetrically XI: Shoulder shrug is symmetric. XII: Tongue is midline without atrophy or fasciculations.  Motor: Tone is normal. Bulk is normal. 5/5 strength was present in all four extremities.  Sensory: Sensation is symmetric to light touch and temperature in the arms and legs. Deep Tendon Reflexes: 2+ and symmetric in the biceps and patellae.  Plantars: Toes are downgoing bilaterally.  Cerebellar: FNF and HKS are intact bilaterally  Labs I have reviewed labs in epic and the  results pertinent to this consultation are:  CBC    Component Value Date/Time   WBC 7.7 08/06/2019 1217   RBC 3.89 (L) 08/06/2019 1217   HGB 14.6 08/06/2019 1226   HCT 43.0 08/06/2019 1226   PLT 264 08/06/2019 1217   MCV 106.4 (H) 08/06/2019 1217   MCH 35.7 (H) 08/06/2019 1217   MCHC 33.6 08/06/2019 1217   RDW 13.1 08/06/2019 1217   LYMPHSABS 1.0 08/06/2019 1217   MONOABS 0.9 08/06/2019 1217   EOSABS 0.1 08/06/2019 1217   BASOSABS 0.0 08/06/2019 1217    CMP     Component Value Date/Time   NA 138 08/06/2019 1226   K 3.8 08/06/2019 1226   CL 103 08/06/2019 1226   CO2 22 07/19/2019 0909   GLUCOSE 106 (H) 08/06/2019 1226   BUN 17 08/06/2019 1226   CREATININE 0.80 08/06/2019 1226   CALCIUM 9.7 07/19/2019 0909   GFRNONAA >60 07/19/2019 0909   GFRAA >60 07/19/2019 0909    Imaging I have reviewed the images obtained:  CT-scan of the brain-positive for moderate volume acute subarachnoid hemorrhage greatest along the left frontal lobe convexity and within the left sylvian fissure. This may reflect aneurysmal subarachnoid  hemorrhage as multiple aneurysms were demonstrated on prior MRA on 07/10/2019, most notably a 5 mm left MCA bifurcation aneurysm.  No hydrocephalus.   Felicie Morn PA-C [facility] [phone] 08/06/2019, 12:47 PM    Assessment:  64 year old male presenting to the hospital with intermittent spells of dysphasia and right hand numbness and weakness.   1. He has known intracranial aneurysms, one of which was to be treated with endovascular intervention on 08/07/2019.   2. CT head is positive for subarachnoid hemorrhage along the left frontal lobe and left Sylvian fissure. This is most likely source for this is the known left MCA aneursym.  3. Catheter based angiogram on 4/16 showed the following:  -- Blister-like aneurysm of the paraophthalmic segment of the right ICA with irregular shape related to superimposed small saccular component. The aneurysm  measures 5.6 mm in largest dimension.  -- Wide neck saccular aneurysm of the left MCA bifurcation measuring approximately 4.6 mm.  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  -- Minimal atherosclerotic disease in the bilateral carotid bifurcation. 4. IR has seen the patient and is obtaining consent for coiling of the left MCA aneurysm today. 5. Hunt and Hess score is 3 due to mild focal neurological deficit (dysphasia)   Plan: -IR to decide antiplatelet regimen versus discontinuation of antiplatelets following coiling procedure -Maintain systolic blood pressure at less than 140 with clevidipine -Hydration with IV normal saline at 75 cc/hr -Admit to ICU following VIR -Frequent neuro checks -CT head in the morning  I have seen and examined the patient. I have formulated the assessment and plan. 64 year old male presenting with left frontal convexity and Sylvian fissure subarachnoid hemorrhage, most likely secondary to leak or small rupture of known left MCA bifurcation aneurysm. Exam reveals dysphasia. Plan is for aneurysm coiling with VIR.  Electronically signed: Dr. Caryl Pina

## 2019-08-06 NOTE — Anesthesia Procedure Notes (Signed)
Procedure Name: Intubation Date/Time: 08/06/2019 5:03 PM Performed by: Barrington Ellison, CRNA Pre-anesthesia Checklist: Patient identified, Emergency Drugs available, Suction available and Patient being monitored Patient Re-evaluated:Patient Re-evaluated prior to induction Oxygen Delivery Method: Circle System Utilized Preoxygenation: Pre-oxygenation with 100% oxygen Induction Type: IV induction, Rapid sequence and Cricoid Pressure applied Laryngoscope Size: Mac and 4 Grade View: Grade I Tube type: Oral Tube size: 7.5 mm Number of attempts: 1 Airway Equipment and Method: Stylet and Oral airway Placement Confirmation: ETT inserted through vocal cords under direct vision,  positive ETCO2 and breath sounds checked- equal and bilateral Secured at: 23 cm Tube secured with: Tape Dental Injury: Teeth and Oropharynx as per pre-operative assessment

## 2019-08-06 NOTE — Anesthesia Procedure Notes (Signed)
Arterial Line Insertion Start/End5/07/2019 4:15 PM, 08/06/2019 4:25 PM Performed by: Shireen Quan, CRNA, CRNA  Patient location: Pre-op. Preanesthetic checklist: patient identified, IV checked, site marked, risks and benefits discussed, surgical consent, monitors and equipment checked, pre-op evaluation and timeout performed Lidocaine 1% used for infiltration radial was placed Catheter size: 20 G Hand hygiene performed   Attempts: 2 Procedure performed without using ultrasound guided technique. Following insertion, dressing applied and Biopatch. Post procedure assessment: normal  Patient tolerated the procedure well with no immediate complications.

## 2019-08-06 NOTE — ED Triage Notes (Signed)
Pt in POV with R hand numbness/wkns intermittent x 2 days. Also c/o L sided HA x 1 wk, started Brilinta last Wednesday, has upcoming stent procedure for aneurysm. Began having dysphasia and the R hand numbness since yesterday, with more frequent resolving episodes today - began at 0915 today, again at 1115,  last again at 1200. BP 137/80 in CT

## 2019-08-07 ENCOUNTER — Inpatient Hospital Stay (HOSPITAL_COMMUNITY): Payer: Managed Care, Other (non HMO)

## 2019-08-07 ENCOUNTER — Ambulatory Visit (HOSPITAL_COMMUNITY): Admission: RE | Admit: 2019-08-07 | Payer: Managed Care, Other (non HMO) | Source: Home / Self Care

## 2019-08-07 ENCOUNTER — Encounter (HOSPITAL_COMMUNITY): Payer: Self-pay

## 2019-08-07 ENCOUNTER — Encounter (HOSPITAL_COMMUNITY)
Admission: EM | Disposition: A | Discharge status: Home / Self Care | Payer: Self-pay | Source: Home / Self Care | Attending: Neuroradiology

## 2019-08-07 ENCOUNTER — Ambulatory Visit (HOSPITAL_COMMUNITY): Admission: RE | Admit: 2019-08-07 | Payer: Managed Care, Other (non HMO) | Source: Ambulatory Visit

## 2019-08-07 DIAGNOSIS — F17200 Nicotine dependence, unspecified, uncomplicated: Secondary | ICD-10-CM

## 2019-08-07 DIAGNOSIS — I6389 Other cerebral infarction: Secondary | ICD-10-CM

## 2019-08-07 DIAGNOSIS — R479 Unspecified speech disturbances: Secondary | ICD-10-CM

## 2019-08-07 DIAGNOSIS — E7800 Pure hypercholesterolemia, unspecified: Secondary | ICD-10-CM

## 2019-08-07 LAB — CBC
HCT: 36 % — ABNORMAL LOW (ref 39.0–52.0)
Hemoglobin: 12.2 g/dL — ABNORMAL LOW (ref 13.0–17.0)
MCH: 35.8 pg — ABNORMAL HIGH (ref 26.0–34.0)
MCHC: 33.9 g/dL (ref 30.0–36.0)
MCV: 105.6 fL — ABNORMAL HIGH (ref 80.0–100.0)
Platelets: 239 10*3/uL (ref 150–400)
RBC: 3.41 MIL/uL — ABNORMAL LOW (ref 4.22–5.81)
RDW: 13.1 % (ref 11.5–15.5)
WBC: 9.9 10*3/uL (ref 4.0–10.5)
nRBC: 0 % (ref 0.0–0.2)

## 2019-08-07 LAB — ECHOCARDIOGRAM COMPLETE
Height: 71 in
Weight: 3040 oz

## 2019-08-07 LAB — BASIC METABOLIC PANEL
Anion gap: 15 (ref 5–15)
BUN: 15 mg/dL (ref 8–23)
CO2: 17 mmol/L — ABNORMAL LOW (ref 22–32)
Calcium: 8.1 mg/dL — ABNORMAL LOW (ref 8.9–10.3)
Chloride: 104 mmol/L (ref 98–111)
Creatinine, Ser: 0.8 mg/dL (ref 0.61–1.24)
GFR calc Af Amer: >60 mL/min (ref >60)
GFR calc non Af Amer: >60 mL/min (ref >60)
Glucose, Bld: 114 mg/dL — ABNORMAL HIGH (ref 70–99)
Potassium: 3.9 mmol/L (ref 3.5–5.1)
Sodium: 136 mmol/L (ref 135–145)

## 2019-08-07 LAB — HEMOGLOBIN A1C
Hgb A1c MFr Bld: 5.2 % (ref 4.8–5.6)
Mean Plasma Glucose: 102.54 mg/dL

## 2019-08-07 LAB — TRIGLYCERIDES: Triglycerides: 106 mg/dL (ref <150)

## 2019-08-07 SURGERY — IR WITH ANESTHESIA
Anesthesia: General

## 2019-08-07 MED ORDER — NIMODIPINE 30 MG PO CAPS
60.0000 mg | ORAL_CAPSULE | ORAL | Status: DC
  Administered 2019-08-07 – 2019-08-20 (×74): 60 mg via ORAL
  Filled 2019-08-07 (×80): qty 2

## 2019-08-07 MED ORDER — NIMODIPINE 30 MG PO CAPS
60.0000 mg | ORAL_CAPSULE | ORAL | Status: DC
  Administered 2019-08-07: 60 mg via ORAL
  Filled 2019-08-07: qty 2

## 2019-08-07 MED ORDER — ADULT MULTIVITAMIN W/MINERALS CH
1.0000 | ORAL_TABLET | Freq: Every day | ORAL | Status: DC
  Administered 2019-08-07 – 2019-08-20 (×14): 1 via ORAL
  Filled 2019-08-07 (×14): qty 1

## 2019-08-07 MED ORDER — DEXMEDETOMIDINE HCL IN NACL 400 MCG/100ML IV SOLN: 0.4000 ug/kg/h | INTRAVENOUS | Status: DC

## 2019-08-07 MED ORDER — LORAZEPAM 2 MG/ML IJ SOLN: 1.0000 mg | INTRAMUSCULAR | Status: AC | PRN

## 2019-08-07 MED ORDER — THIAMINE HCL 100 MG/ML IJ SOLN: 100.0000 mg | Freq: Every day | INTRAMUSCULAR | Status: DC

## 2019-08-07 MED ORDER — ATORVASTATIN CALCIUM 40 MG PO TABS
40.0000 mg | ORAL_TABLET | Freq: Every day | ORAL | Status: DC
  Administered 2019-08-07 – 2019-08-20 (×14): 40 mg via ORAL
  Filled 2019-08-07 (×14): qty 1

## 2019-08-07 MED ORDER — LORAZEPAM 1 MG PO TABS
1.0000 mg | ORAL_TABLET | ORAL | Status: AC | PRN
  Administered 2019-08-09 (×2): 1 mg via ORAL
  Filled 2019-08-07 (×2): qty 1

## 2019-08-07 MED ORDER — FOLIC ACID 1 MG PO TABS
1.0000 mg | ORAL_TABLET | Freq: Every day | ORAL | Status: DC
  Administered 2019-08-07 – 2019-08-20 (×14): 1 mg via ORAL
  Filled 2019-08-07 (×15): qty 1

## 2019-08-07 MED ORDER — THIAMINE HCL 100 MG PO TABS
100.0000 mg | ORAL_TABLET | Freq: Every day | ORAL | Status: DC
  Administered 2019-08-07 – 2019-08-20 (×14): 100 mg via ORAL
  Filled 2019-08-07 (×14): qty 1

## 2019-08-07 MED ORDER — PANTOPRAZOLE SODIUM 40 MG PO TBEC
40.0000 mg | DELAYED_RELEASE_TABLET | Freq: Every day | ORAL | Status: DC
  Administered 2019-08-07 – 2019-08-20 (×14): 40 mg via ORAL
  Filled 2019-08-07 (×14): qty 1

## 2019-08-07 NOTE — Progress Notes (Signed)
Referring Physician(s): Jaffe,Adam R  Supervising Physician: Baldemar Lenis  Patient Status:  [facility] - In-pt  Chief Complaint: "Speech"  Subjective:  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 Tommie Sams. Patient awake and alert sitting in bed undergoing echocardiogram. Accompanied by wife at bedside. Complains of speech difficulties- demonstrating expressive aphasia. Right groin incision c/d/i.  MR brain this AM: 1. Scattered subarachnoid hemorrhage involving the left sylvian fissure and overlying left frontal parietal convexity, relatively stable from prior CT, and consistent with recently ruptured left MCA bifurcation aneurysm. 2. Few probable punctate ischemic nonhemorrhagic infarcts involving the left frontal and parietal lobes as above. Additional scattered serpiginous diffusion abnormality favored to in large part be related to the underlying subarachnoid hemorrhage. 3. Sequelae of interval coil embolization of left MCA bifurcation aneurysm.   Allergies: Other  Medications: Prior to Admission medications   Medication Sig Start Date End Date Taking? Authorizing Provider  acetaminophen (TYLENOL) 500 MG tablet Take 1,000 mg by mouth every 6 (six) hours as needed for mild pain or headache.   Yes [provider]  aspirin 81 MG EC tablet Take 81 mg by mouth daily. Swallow whole.   Yes [provider]  ibuprofen (ADVIL) 200 MG tablet Take 400 mg by mouth every 6 (six) hours as needed for moderate pain.   Yes [provider]  ticagrelor (BRILINTA) 90 MG TABS tablet Take 90 mg by mouth daily.    Yes [provider]  diazepam (VALIUM) 5 MG tablet Take 1 tablet 30 mins prior to MRI. May take second dose if needed. Patient not taking: Reported on 07/17/2019 07/02/19   Van Clines, MD     Vital  Signs: BP 109/65   Pulse 78   Temp 98.3 F (36.8 C) (Axillary)   Resp 14   Ht 5\' 11"  (1.803 m)   Wt 190 lb (86.2 kg)   SpO2 97%   BMI 26.50 kg/m   Physical Exam Vitals and nursing note reviewed.  Constitutional:      General: [name] not in acute distress.    Appearance: Normal appearance.  Pulmonary:     Effort: Pulmonary effort is normal. No respiratory distress.  Skin:    General: Skin is warm and dry.     Comments: Right groin incision soft without active bleeding or hematoma.  Neurological:     Mental Status: [name] alert.     Comments: Alert, awake, and oriented x3. Follows simple commands. Demonstrates expressive aphasia. No facial asymmetry. Tongue midline. Can spontaneously move all extremities. No pronator drift. Distal pulses (DPs and PTs) 2+ bilaterally.     Imaging: MR BRAIN WO CONTRAST  Result Date: 08/07/2019 CLINICAL DATA:  Initial evaluation for worsened speech, status post coil embolization of MCA aneurysm. EXAM: MRI HEAD WITHOUT CONTRAST TECHNIQUE: Multiplanar, multiecho pulse sequences of the brain and surrounding structures were obtained without intravenous contrast. COMPARISON:  Prior head CT from 08/06/2019. FINDINGS: Brain: Mild age-related cerebral atrophy. Scattered susceptibility artifact and T1 hyperintensity seen throughout the left sylvian fissure and overlying left frontoparietal sulci, consistent with subarachnoid hemorrhage, similar to previous CT. Scattered serpiginous diffusion signal abnormality seen throughout this region, favored in large part to be related to the underlying subarachnoid blood, although superimposed small volume ischemic changes could be present as well, difficult to exclude. There are a few scattered punctate foci of diffusion abnormality involving the  cortical gray matter of the parasagittal left frontal lobe (series 5, image 93) and left parietal lobe (series 5, images 86, 87, likely reflecting punctate ischemic infarcts. No  frank intraparenchymal hematoma or mass effect. No other diffusion abnormality to suggest acute or subacute ischemia. Gray-white matter differentiation otherwise maintained. No other areas of remote cortical infarction. No other evidence for acute or chronic intracranial hemorrhage. No mass lesion or midline shift. No hydrocephalus or extra-axial collection. Midline structures intact. Pituitary gland suprasellar region normal. Vascular: Susceptibility artifact related to coiled left MCA bifurcation aneurysm. Major intracranial vascular flow voids maintained elsewhere. Skull and upper cervical spine: Craniocervical junction within normal limits. Upper cervical spine normal. Bone marrow signal intensity within normal limits. No scalp soft tissue abnormality. Sinuses/Orbits: Globes and orbital soft tissues within normal limits. Mild scattered mucosal thickening noted within the ethmoidal air cells. Paranasal sinuses are otherwise largely clear. Trace right mastoid effusion. Inner ear structures grossly normal. Other: None. IMPRESSION: 1. Scattered subarachnoid hemorrhage involving the left sylvian fissure and overlying left frontal parietal convexity, relatively stable from prior CT, and consistent with recently ruptured left MCA bifurcation aneurysm. 2. Few probable punctate ischemic nonhemorrhagic infarcts involving the left frontal and parietal lobes as above. Additional scattered serpiginous diffusion abnormality favored to in large part be related to the underlying subarachnoid hemorrhage. 3. Sequelae of interval coil embolization of left MCA bifurcation aneurysm. Electronically Signed   By: Jeannine Boga M.D.   On: 08/07/2019 02:21   Chest Port 1 View  Result Date: 08/06/2019 CLINICAL DATA:  64 year old male code stroke presentation with multiple intracranial aneurysms on MRA/conventional cerebral angiogram and presenting with aneurysmal type subarachnoid hemorrhage today. EXAM: PORTABLE CHEST 1 VIEW  COMPARISON:  None. FINDINGS: Portable AP upright view at 1320 hours. Lung volumes and mediastinal contours are within normal limits. Visualized tracheal air column is within normal limits. Allowing for portable technique the lungs are clear. No pneumothorax. No acute osseous abnormality identified. IMPRESSION: No acute cardiopulmonary abnormality. Electronically Signed   By: Genevie Ann M.D.   On: 08/06/2019 13:28   CT HEAD CODE STROKE WO CONTRAST  Addendum Date: 08/06/2019   ADDENDUM REPORT: 08/06/2019 12:51 ADDENDUM: These results were called by telephone at the time of interpretation on 08/06/2019 at 12:51 pm to provider Dr. Cheral Marker, who verbally acknowledged these results. Electronically Signed   By: Kellie Simmering DO   On: 08/06/2019 12:51   Result Date: 08/06/2019 CLINICAL DATA:  Code stroke. Neuro deficit, acute, stroke suspected. Difficulty speaking. EXAM: CT HEAD WITHOUT CONTRAST TECHNIQUE: Contiguous axial images were obtained from the base of the skull through the vertex without intravenous contrast. COMPARISON:  MRI/MRA head 07/10/2019. FINDINGS: Brain: There is moderate volume acute subareolar hemorrhage greatest along the left frontal lobe convexity and within the left sylvian fissure. No acute demarcated cortical infarct is identified. No extra-axial fluid collection. No evidence of intracranial mass. No midline shift. Vascular: No hyperdense vessel. Skull: Normal. Negative for fracture or focal lesion. Sinuses/Orbits: Visualized orbits show no acute finding. Mild ethmoid sinus mucosal thickening. No significant mastoid effusion. IMPRESSION: Moderate volume acute subarachnoid hemorrhage greatest along the left frontal lobe convexity and within the left sylvian fissure. 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 No hydrocephalus. No acute demarcated cortical infarct.  Electronically Signed: By: Kellie Simmering DO On: 08/06/2019 12:39    Labs:  CBC: Recent Labs    07/15/19  1344 07/15/19 1344 07/19/19 0909 08/06/19 1217 08/06/19 1226 08/07/19 0631  WBC 8.9  --  6.6 7.7  --  9.9  HGB 14.7   < > 14.9 13.9 14.6 12.2*  HCT 43.9   < > 44.0 41.4 43.0 36.0*  PLT 232  --  257 264  --  239   < > = values in this interval not displayed.    COAGS: Recent Labs    07/15/19 1344 08/06/19 1217 08/06/19 1244  INR 1.0 1.0 1.0  APTT 29 27 28     BMP: Recent Labs    07/15/19 1344 07/15/19 1344 07/19/19 0909 08/06/19 1217 08/06/19 1226 08/07/19 0631  NA 140   < > 142 136 138 136  K 5.2*   < > 4.4 3.9 3.8 3.9  CL 102   < > 104 102 103 104  CO2 26  --  22 21*  --  17*  GLUCOSE 107*   < > 97 113* 106* 114*  BUN 17   < > 14 15 17 15   CALCIUM 9.9  --  9.7 9.6  --  8.1*  CREATININE 0.93   < > 0.91 1.01 0.80 0.80  GFRNONAA >60  --  >60 >60  --  >60  GFRAA >60  --  >60 >60  --  >60   < > = values in this interval not displayed.    LIVER FUNCTION TESTS: Recent Labs    08/06/19 1217  BILITOT 0.9  AST 49*  ALT 46*  ALKPHOS 60  PROT 7.4  ALBUMIN 4.0    Assessment and Plan:  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 10/06/19. Patient's condition stable- demonstrates expressive aphasia, no additional neurologic symptoms noted. Right groin incision stable, distal pulses (DPs and PTs) 2+ bilaterally. Discussed procedure outcomes and plans for inpatient monitoring for vasospasm with patient and wife. All questions answered and concerns addressed. Begin tobacco/alcohol withdraw protocol. Begin speech therapy/PT/OT. Ok to D/C foley. Arterial line to remain per Dr. 10/06/2019. Continue taking Aspirin 81 mg once daily. Further plans per neurology- appreciate and agree with management. NIR to follow.    Electronically Signed: Tommie Sams, PA-C 08/07/2019, 10:03 AM   I spent a total of 25 Minutes at the the patient's bedside AND on the patient's hospital floor or unit, greater than 50% of which was counseling/coordinating care for ruptured left MCA bifurcation aneurysm s/p coiling.

## 2019-08-07 NOTE — Progress Notes (Signed)
TCD       has been completed. Preliminary results can be found under CV proc through chart review. Jannel Lynne, BS, RDMS, RVT   

## 2019-08-07 NOTE — Social Work (Signed)
CSW met with pt at bedside. CSW introduced self and explained her role. CSW completed sbirt with pt.  Pt scored a 4 on the sbirt scale. Pt reported that he drinks 2 glasses of wine most nights of the week. Pt states he doesn't drink more than that. PT stated he did not have a problem with his alcohol use. Pt denied substance use. CSW offered resources and the pt declined.   Emeterio Reeve, Latanya Presser, [place] Social Worker [phone]

## 2019-08-07 NOTE — Evaluation (Signed)
Occupational Therapy Evaluation Patient Details Name: Victor Pena MRN: [id number] DOB: February 25, 1956 Today's Date: 08/07/2019    History of Present Illness 64 y.o. male with a history of intracranial aneurysms and diplopia.  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  He underwent a diagnostic cerebral angiogram on 07/19/2019 that showed a 4.4 x 4.2 mm wide neck left MCA bifurcation aneurysm and a blisterlike right ICA bifurcation aneurysm measuring 5.6 x 3 0.1 mm with superimposed pseudolobation. Pt presented to ED with intermittent dysphasia, HA, and difficulty utilizing R hand. CT revealed SAH. Pt now s/p coiling.   Clinical Impression   PT admitted with aneurysm with coiling. Pt currently with functional limitiations due to the deficits listed below (see OT problem list). Pt currently demonstrates R proprioception deficits and decreased sensation affecting adls and fine motor task. Pt with visual attention able to utilize R hand at increased independence.  Pt will benefit from skilled OT to increase their independence and safety with adls and balance to allow discharge outpatient OT.     Follow Up Recommendations  Outpatient OT    Equipment Recommendations  None recommended by OT    Recommendations for Other Services Speech consult     Precautions / Restrictions Precautions Precautions: Fall Restrictions Weight Bearing Restrictions: No      Mobility Bed Mobility Overal bed mobility: Needs Assistance Bed Mobility: Supine to Sit     Supine to sit: Supervision        Transfers                      Balance Overall balance assessment: Needs assistance Sitting-balance support: No upper extremity supported;Feet supported Sitting balance-Leahy Scale: Good Sitting balance - Comments: supervision to don socks                                   ADL either performed or assessed with clinical judgement   ADL Overall ADL's  : Needs assistance/impaired Eating/Feeding: Set up;Sitting   Grooming: Oral care;Minimal assistance Grooming Details (indicate cue type and reason): recommend use of electric tooth brush to help increased proprioception                               General ADL Comments: pt educated on use of mirror or using phone with L hand to act like mirror to help with visualize task. pt when provided mirror able to place glasses one. pt without visual input is undershooting     Vision Baseline Vision/History: Wears glasses Wears Glasses: At all times       Perception     Praxis      Pertinent Vitals/Pain Pain Assessment: No/denies pain     Hand Dominance Right   Extremity/Trunk Assessment Upper Extremity Assessment Upper Extremity Assessment: RUE deficits/detail RUE Deficits / Details: pt with decreased fine motor but grasp is 5 out 5. propriocpetion deficits due to sensory changes RUE Sensation: decreased proprioception RUE Coordination: decreased fine motor(pt with slowed and more deliberate finger to nose)   Lower Extremity Assessment Lower Extremity Assessment: Defer to PT evaluation   Cervical / Trunk Assessment Cervical / Trunk Assessment: Normal   Communication Communication Communication: Expressive difficulties(decr speed and each syllable to express a word)   Cognition Arousal/Alertness: Awake/alert Behavior During Therapy: WFL for tasks assessed/performed Overall Cognitive Status:  Within Functional Limits for tasks assessed                   Orientation Level: (pt believes it is may 7th (confused 2/2 change in surg date))             General Comments: pt needs increased time to produce the correct term   General Comments  VSS    Exercises Exercises: Other exercises Other Exercises Other Exercises: fine motor handout provided with x7 exercises checked off and demonstrated by patient 10 reps. pt provided a cup with paper clips to work on  picking up small object and placement in a container Other Exercises: pt educated on rice or bowl with objects inside or playdoh with coins inside to dig out with R hand Other Exercises: pt educated on use of cell phone to do fine motor task for free with games   Shoulder Instructions      Home Living Family/patient expects to be discharged to:: Private residence Living Arrangements: Spouse/significant other Available Help at Discharge: Family;Available 24 hours/day Type of Home: House Home Access: Stairs to enter [employer] of Steps: 3 Entrance Stairs-Rails: Left Home Layout: Multi-level Alternate Level Stairs-Number of Steps: 2 flights, sleeps in man cave on couch. 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 Problem List: Decreased strength;Decreased coordination;Impaired UE functional use      OT Treatment/Interventions: Therapeutic exercise;Self-care/ADL training;Patient/family education    OT Goals(Current goals can be found in the care plan section) Acute Rehab OT Goals Patient Stated Goal: 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" OT Goal Formulation: With patient Time For Goal Achievement: 08/21/19 Potential to Achieve Goals: Good  OT Frequency: Min 2X/week   Barriers to D/C:            Co-evaluation              AM-PAC OT "6 Clicks" Daily Activity     Outcome Measure Help from another person eating meals?: A Little Help from another person taking care of personal grooming?: A Little Help from another person toileting, which includes using toliet, bedpan, or urinal?: A Little Help from another person bathing (including washing, rinsing, drying)?: A Little Help from another person to put on and  taking off regular upper body clothing?: A Little Help from another person to put on and taking off regular lower body clothing?: A Little 6 Click Score: 18   End of Session Nurse Communication: Mobility status;Precautions  Activity Tolerance: Patient tolerated treatment well Patient left: in bed;with call bell/phone within reach;with bed alarm set  OT Visit Diagnosis: Unsteadiness on feet (R26.81);Muscle weakness (generalized) (M62.81)                Time: 2409-7353 OT Time Calculation (min): 25 min Charges:  OT General Charges $OT Visit: 1 Visit OT Evaluation $OT Eval Moderate Complexity: 1 Mod   Brynn, OTR/L  Acute Rehabilitation Services Pager: [phone] Office: [phone] .   Mateo Flow 08/07/2019, 3:48 PM

## 2019-08-07 NOTE — Progress Notes (Signed)
STROKE TEAM PROGRESS NOTE   INTERVAL HISTORY His wife and EEG tech are at the bedside.  Pt just finished EEG. Pt awake alert still has mild to moderate expressive aphasia, mild right facial droop and right hand weakness. Following all simple commands. Wife stated that pt has been smoking more and drink large amount of alcohol lately. Denies HA.   Vitals:   08/07/19 0715 08/07/19 0730 08/07/19 0800 08/07/19 1045  BP: (!) 138/106 109/65  118/60  Pulse: 90 78  84  Resp: 12 14  15   Temp:   98.3 F (36.8 C)   TempSrc:   Axillary   SpO2: 96% 97%  97%  Weight:      Height:        CBC:  Recent Labs  Lab 08/06/19 1217 08/06/19 1217 08/06/19 1226 08/07/19 0631  WBC 7.7  --   --  9.9  NEUTROABS 5.7  --   --   --   HGB 13.9   < > 14.6 12.2*  HCT 41.4   < > 43.0 36.0*  MCV 106.4*  --   --  105.6*  PLT 264  --   --  239   < > = values in this interval not displayed.    Basic Metabolic Panel:  Recent Labs  Lab 08/06/19 1217 08/06/19 1217 08/06/19 1226 08/07/19 0631  NA 136   < > 138 136  K 3.9   < > 3.8 3.9  CL 102   < > 103 104  CO2 21*  --   --  17*  GLUCOSE 113*   < > 106* 114*  BUN 15   < > 17 15  CREATININE 1.01   < > 0.80 0.80  CALCIUM 9.6  --   --  8.1*   < > = values in this interval not displayed.   Lipid Panel:     Component Value Date/Time   CHOL 218 (H) 08/06/2019 1244   TRIG 127 08/06/2019 1244   HDL 44 08/06/2019 1244   CHOLHDL 5.0 08/06/2019 1244   VLDL 25 08/06/2019 1244   LDLCALC 149 (H) 08/06/2019 1244   HgbA1c:  Lab Results  Component Value Date   HGBA1C 5.2 08/07/2019   Urine Drug Screen: No results found for: LABOPIA, COCAINSCRNUR, LABBENZ, AMPHETMU, THCU, LABBARB  Alcohol Level No results found for: ETH  IMAGING past 24 hours MR BRAIN WO CONTRAST  Result Date: 08/07/2019 CLINICAL DATA:  Initial evaluation for worsened speech, status post coil embolization of MCA aneurysm. EXAM: MRI HEAD WITHOUT CONTRAST TECHNIQUE: Multiplanar, multiecho  pulse sequences of the brain and surrounding structures were obtained without intravenous contrast. COMPARISON:  Prior head CT from 08/06/2019. FINDINGS: Brain: Mild age-related cerebral atrophy. Scattered susceptibility artifact and T1 hyperintensity seen throughout the left sylvian fissure and overlying left frontoparietal sulci, consistent with subarachnoid hemorrhage, similar to previous CT. Scattered serpiginous diffusion signal abnormality seen throughout this region, favored in large part to be related to the underlying subarachnoid blood, although superimposed small volume ischemic changes could be present as well, difficult to exclude. There are a few scattered punctate foci of diffusion abnormality involving the cortical gray matter of the parasagittal left frontal lobe (series 5, image 93) and left parietal lobe (series 5, images 86, 87, likely reflecting punctate ischemic infarcts. No frank intraparenchymal hematoma or mass effect. No other diffusion abnormality to suggest acute or subacute ischemia. Gray-white matter differentiation otherwise maintained. No other areas of remote cortical infarction. No other evidence for  acute or chronic intracranial hemorrhage. No mass lesion or midline shift. No hydrocephalus or extra-axial collection. Midline structures intact. Pituitary gland suprasellar region normal. Vascular: Susceptibility artifact related to coiled left MCA bifurcation aneurysm. Major intracranial vascular flow voids maintained elsewhere. Skull and upper cervical spine: Craniocervical junction within normal limits. Upper cervical spine normal. Bone marrow signal intensity within normal limits. No scalp soft tissue abnormality. Sinuses/Orbits: Globes and orbital soft tissues within normal limits. Mild scattered mucosal thickening noted within the ethmoidal air cells. Paranasal sinuses are otherwise largely clear. Trace right mastoid effusion. Inner ear structures grossly normal. Other: None.  IMPRESSION: 1. Scattered subarachnoid hemorrhage involving the left sylvian fissure and overlying left frontal parietal convexity, relatively stable from prior CT, and consistent with recently ruptured left MCA bifurcation aneurysm. 2. Few probable punctate ischemic nonhemorrhagic infarcts involving the left frontal and parietal lobes as above. Additional scattered serpiginous diffusion abnormality favored to in large part be related to the underlying subarachnoid hemorrhage. 3. Sequelae of interval coil embolization of left MCA bifurcation aneurysm. Electronically Signed   By: Rise Mu M.D.   On: 08/07/2019 02:21   Chest Port 1 View  Result Date: 08/06/2019 CLINICAL DATA:  64 year old male code stroke presentation with multiple intracranial aneurysms on MRA/conventional cerebral angiogram and presenting with aneurysmal type subarachnoid hemorrhage today. EXAM: PORTABLE CHEST 1 VIEW COMPARISON:  None. FINDINGS: Portable AP upright view at 1320 hours. Lung volumes and mediastinal contours are within normal limits. Visualized tracheal air column is within normal limits. Allowing for portable technique the lungs are clear. No pneumothorax. No acute osseous abnormality identified. IMPRESSION: No acute cardiopulmonary abnormality. Electronically Signed   By: Odessa Fleming M.D.   On: 08/06/2019 13:28   ECHOCARDIOGRAM COMPLETE  Result Date: 08/07/2019    ECHOCARDIOGRAM REPORT   Patient Name:   Lamoine A Runyon Date of Exam: 08/07/2019 Medical Rec #:  [id number]      Height:       71.0 in Accession #:    [id number]     Weight:       190.0 lb Date of Birth:  1955-09-27      BSA:          2.063 m Patient Age:    64 years       BP:           109/65 mmHg Patient Gender: M              HR:           79 bpm. Exam Location:  Inpatient Procedure: 2D Echo Indications:    Stroke I163.9  History:        Patient has no prior history of Echocardiogram examinations.  Sonographer:    Thurman Coyer RDCS (AE) Referring Phys:  [id number] Marvel Plan IMPRESSIONS  1. Left ventricular ejection fraction, by estimation, is 60 to 65%. The left ventricle has normal function. The left ventricle has no regional wall motion abnormalities. Left ventricular diastolic parameters are consistent with Grade II diastolic dysfunction (pseudonormalization).  2. Right ventricular systolic function is normal. The right ventricular size is normal.  3. The mitral valve is normal in structure. No evidence of mitral valve regurgitation. No evidence of mitral stenosis.  4. The aortic valve was not well visualized. Aortic valve regurgitation is not visualized. No aortic stenosis is present.  5. Aortic dilatation noted. 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  6. The inferior vena cava is normal in size with  greater than 50% respiratory variability, suggesting right atrial pressure of 3 mmHg. Conclusion(s)/Recommendation(s): No intracardiac source of embolism detected on this transthoracic study. A transesophageal echocardiogram is recommended to exclude cardiac source of embolism if clinically indicated. FINDINGS  Left Ventricle: Left ventricular ejection fraction, by estimation, is 60 to 65%. The left ventricle has normal function. The left ventricle has no regional wall motion abnormalities. The left ventricular internal cavity size was normal in size. There is  no left ventricular hypertrophy. Left ventricular diastolic parameters are consistent with Grade II diastolic dysfunction (pseudonormalization). Right Ventricle: The right ventricular size is normal. No increase in right ventricular wall thickness. Right ventricular systolic function is normal. Left Atrium: Left atrial size was normal in size. Right Atrium: Right atrial size was normal in size. Pericardium: There is no evidence of pericardial effusion. Mitral Valve: The mitral valve is normal in structure. Normal mobility of the mitral valve leaflets. No evidence of mitral valve  regurgitation. No evidence of mitral valve stenosis. Tricuspid Valve: The tricuspid valve is normal in structure. Tricuspid valve regurgitation is not demonstrated. No evidence of tricuspid stenosis. Aortic Valve: The aortic valve was not well visualized. Aortic valve regurgitation is not visualized. No aortic stenosis is present. Pulmonic Valve: The pulmonic valve was normal in structure. Pulmonic valve regurgitation is not visualized. No evidence of pulmonic stenosis. Aorta: Aortic dilatation noted. 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 Venous: The inferior vena cava is normal in size with greater than 50% respiratory variability, suggesting right atrial pressure of 3 mmHg. IAS/Shunts: No atrial level shunt detected by color flow Doppler.  LEFT VENTRICLE PLAX 2D LVIDd:         4.50 cm  Diastology LVIDs:         3.00 cm  LV e' lateral:   9.57 cm/s LV PW:         1.00 cm  LV E/e' lateral: 6.7 LV IVS:        1.00 cm  LV e' medial:    9.36 cm/s LVOT diam:     2.70 cm  LV E/e' medial:  6.8 LV SV:         102 LV SV Index:   49 LVOT Area:     5.73 cm  RIGHT VENTRICLE RV S prime:     15.20 cm/s TAPSE (M-mode): 1.7 cm LEFT ATRIUM             Index       RIGHT ATRIUM           Index LA diam:        3.20 cm 1.55 cm/m  RA Area:     14.30 cm LA Vol (A2C):   53.6 ml 25.98 ml/m RA Volume:   34.90 ml  16.92 ml/m LA Vol (A4C):   43.6 ml 21.13 ml/m LA Biplane Vol: 49.6 ml 24.04 ml/m  AORTIC VALVE LVOT Vmax:   87.50 cm/s LVOT Vmean:  58.100 cm/s LVOT VTI:    0.178 m  AORTA Ao Root diam: 3.80 cm MITRAL VALVE MV Area (PHT): 2.52 cm    SHUNTS MV Decel Time: 301 msec    Systemic VTI:  0.18 m MV E velocity: 63.80 cm/s  Systemic Diam: 2.70 cm MV A velocity: 57.00 cm/s MV E/A ratio:  1.12 Donato Schultz MD Electronically signed by Donato Schultz MD Signature Date/Time: 08/07/2019/11:13:11 AM    Final    CT HEAD CODE STROKE WO CONTRAST  Addendum Date: 08/06/2019  ADDENDUM REPORT: 08/06/2019 12:51 ADDENDUM:  These results were called by telephone at the time of interpretation on 08/06/2019 at 12:51 pm to provider Dr. Otelia Limes, who verbally acknowledged these results. Electronically Signed   By: Jackey Loge DO   On: 08/06/2019 12:51   Result Date: 08/06/2019 CLINICAL DATA:  Code stroke. Neuro deficit, acute, stroke suspected. Difficulty speaking. EXAM: CT HEAD WITHOUT CONTRAST TECHNIQUE: Contiguous axial images were obtained from the base of the skull through the vertex without intravenous contrast. COMPARISON:  MRI/MRA head 07/10/2019. FINDINGS: Brain: There is moderate volume acute subareolar hemorrhage greatest along the left frontal lobe convexity and within the left sylvian fissure. No acute demarcated cortical infarct is identified. No extra-axial fluid collection. No evidence of intracranial mass. No midline shift. Vascular: No hyperdense vessel. Skull: Normal. Negative for fracture or focal lesion. Sinuses/Orbits: Visualized orbits show no acute finding. Mild ethmoid sinus mucosal thickening. No significant mastoid effusion. IMPRESSION: Moderate volume acute subarachnoid hemorrhage greatest along the left frontal lobe convexity and within the left sylvian fissure. 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 No hydrocephalus. No acute demarcated cortical infarct. Electronically Signed: By: Jackey Loge DO On: 08/06/2019 12:39    PHYSICAL EXAM  Temp:  [98 F (36.7 C)-99 F (37.2 C)] 98.3 F (36.8 C) (05/05 0800) Pulse Rate:  [72-96] 76 (05/05 1200) Resp:  [11-24] 20 (05/05 1200) BP: (91-159)/(48-108) 120/66 (05/05 1200) SpO2:  [94 %-100 %] 96 % (05/05 1200) Arterial Line BP: (92-142)/(40-64) 128/59 (05/05 1200)  General - Well nourished, well developed, in no apparent distress, mildly frustrated about not able to get words out.  Ophthalmologic - fundi not visualized due to  noncooperation.  Cardiovascular - Regular rhythm and rate.  Mental Status -  Level of arousal and orientation to time, place, and person were intact. Language exam showed mild to moderate expressive aphasia, hesitation and paraphasia, able to name, but skipping words on sentence repetition. Comprehension intact. Attention span and concentration were normal. Fund of Knowledge was assessed and was intact.  Cranial Nerves II - XII - II - Visual field intact OU. III, IV, VI - Extraocular movements intact. V - Facial sensation intact bilaterally. VII - right mild facial droop. VIII - Hearing & vestibular intact bilaterally. X - Palate elevates symmetrically. XI - Chin turning & shoulder shrug intact bilaterally. XII - Tongue protrusion intact.  Motor Strength - The patient's strength was normal in all extremities and pronator drift was absent except right hand grip 4+/5.  Bulk was normal and fasciculations were absent.   Motor Tone - Muscle tone was assessed at the neck and appendages and was normal.  Reflexes - The patient's reflexes were symmetrical in all extremities and he had no pathological reflexes.  Sensory - Light touch, temperature/pinprick were assessed and were symmetrical.    Coordination - The patient had normal movements in the hands with no ataxia or dysmetria.  Tremor was absent.  Gait and Station - deferred.   ASSESSMENT/PLAN Victor Pena is a 64 y.o. 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 He was taking aspirin and brilinta as of 4/28 and scheduled for an endovascular procedure to treat his RIGHT sided aneurysm 5/5, however, on 5/4 he presented with intermittent dysphagia, R hand numbness and L occipital HA. CT showed L frontoparietal SAH.  He was taken to IR for aneurysm securement.   Aneurysmal SAH:  L frontoparietal SAH d/t L MCA aneurysm rupture s/p emergent coiling  Code  Stroke CT head moderate L frontal lobe convexity SAH.   Cerebral angio 4/16 - blister like aneurysm right ICA paraophthalmic segment 5.18mm. and left MCA bifurcation saccular aneurysm wide neck 4.66mm  Cerebral angio 5/4 - 4.83mm wide neck L MCA bifurcation aneurysm s/p balloon assisted coiling.   MRI Scattered SAH L sylvian fissure and L frontoparietal convexity stable. Probable few punctate L frontal and parietal lobe infarcts, other DWI abnormality d/t SAH. L MCA coil.  2D Echo EF 60-65%. No source of embolus   TCD MWF 5/5 no vasospasm   EEG pending   LDL 149  HgbA1c 5.2  Add nimodipine 60 mg q4h x 21 days  SCDs for VTE prophylaxis  aspirin 81 mg daily and Brilinta 90 daily prior to admission, now on aspirin 81 mg daily. Stopped Brilinta.   Therapy recommendations:  No PT  Disposition:  Plan return home  Blood Pressure  Home meds:  None, no hx HTN  On Cleviprex . SBP goal < 140 til tonight per IR, then < 160  . Long-term BP goal normotensive  Hyperlipidemia  Home meds:  No statin  LDL 149  On lipitor 40  Continue on discharge  Alcohol use  ETOH use, alcohol intake elevated lately  Advised to drink no more than 2 drink(s) a day.   Placed on CIWA protocol  (prn ativan)  FA/B1/MVI  Seizure precaution  Tobacco abuse  Current smoker  Smoking cessation counseling provided  Nicotine patch declined by pt   Pt is willing to quit  Other Stroke Risk Factors  Family hx aneurysm (father)  Other [facility] day # 1  This patient is critically ill due to [facility] due to aneurysm rupture, emergent coiling, heavy alcohol, HTN and at significant risk of neurological worsening, death form aneurysm re-rupture, seizure, hydrocephalus, DT, heart failure. This patient's care requires constant monitoring of vital signs, hemodynamics, respiratory and cardiac monitoring, review of multiple databases, neurological assessment, discussion with family, other  specialists and medical decision making of high complexity. I spent 45 minutes of neurocritical care time in the care of this patient. I had long discussion with pt and wife at bedside, updated pt current condition, treatment plan and potential prognosis, and answered all the questions. They expressed understanding and appreciation. I also discussed with Dr. Norma Fredrickson.   Rosalin Hawking, MD PhD Stroke Neurology 08/07/2019 1:23 PM   To contact Stroke Continuity provider, please refer to [email or web address] After hours, contact General Neurology

## 2019-08-07 NOTE — Evaluation (Signed)
Physical Therapy Evaluation Patient Details Name: Victor Pena MRN: [id number] DOB: 1955-11-06 Today's Date: 08/07/2019   History of Present Illness  64 y.o. male with a history of intracranial aneurysms and diplopia.  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  He underwent a diagnostic cerebral angiogram on 07/19/2019 that showed a 4.4 x 4.2 mm wide neck left MCA bifurcation aneurysm and a blisterlike right ICA bifurcation aneurysm measuring 5.6 x 3 0.1 mm with superimposed pseudolobation. Pt presented to ED with intermittent dysphasia, HA, and difficulty utilizing R hand. CT revealed SAH. Pt now s/p coiling.  Clinical Impression  Pt presents to PT with deficits in gait, balance, communication, and cognition, but appears to not be far from his functional baseline in mobility. Pt ambulates without physical assistance, performing dynamic gait tasks including head turns, change of gait speed, and stopping abruptly. Pt will benefit from PT follow-up to further assess stair negotiation after A-line is discharged as pt has 2 flights of steps to ascend to get to normal sleeping spot on couch in [place]. Pt does demonstrate some deficits in R grip strength and coordination, dropping phone and with difficulty donning socks during session. Pt will continue to benefit from acute PT POC to aide ion a complete return to the patient's prior level of function.    Follow Up Recommendations No PT follow up    Equipment Recommendations  None recommended by PT    Recommendations for Other Services       Precautions / Restrictions Precautions Precautions: Fall Restrictions Weight Bearing Restrictions: No      Mobility  Bed Mobility Overal bed mobility: Needs Assistance Bed Mobility: Supine to Sit     Supine to sit: Supervision        Transfers Overall transfer level: Needs assistance Equipment used: None Transfers: Sit to/from Stand Sit to Stand: Supervision             Ambulation/Gait Ambulation/Gait assistance: Supervision Gait Distance (Feet): 300 Feet Assistive device: None Gait Pattern/deviations: Step-through pattern Gait velocity: functional Gait velocity interpretation: >2.62 ft/sec, indicative of community ambulatory General Gait Details: pt with steady step through gait, able to change gait speeds perform head turns and stop abruptly without LOB  Stairs Stairs: (deferred, A-line limiting number of stairs)          Wheelchair Mobility    Modified Rankin (Stroke Patients Only) Modified Rankin (Stroke Patients Only) Pre-Morbid Rankin Score: No symptoms Modified Rankin: Moderate disability     Balance Overall balance assessment: Needs assistance Sitting-balance support: No upper extremity supported;Feet supported Sitting balance-Leahy Scale: Good Sitting balance - Comments: supervision to don socks   Standing balance support: No upper extremity supported Standing balance-Leahy Scale: Good Standing balance comment: no significant LOB during gait                             Pertinent Vitals/Pain Pain Assessment: No/denies pain    Home Living Family/patient expects to be discharged to:: Private residence Living Arrangements: Spouse/significant other Available Help at Discharge: Family;Available 24 hours/day Type of Home: House Home Access: Stairs to enter Entrance Stairs-Rails: Left Entrance Stairs-Number of Steps: 3 Home Layout: Multi-level Home Equipment: None      Prior Function Level of Independence: Independent         Comments: works as a Social worker Dominance   Dominant Hand: Right  Extremity/Trunk Assessment   Upper Extremity Assessment Upper Extremity Assessment: RUE deficits/detail RUE Deficits / Details: impaired grip strength and coordination RUE Coordination: (pt with slowed and more deliberate finger to nose)    Lower Extremity  Assessment Lower Extremity Assessment: Overall WFL for tasks assessed    Cervical / Trunk Assessment Cervical / Trunk Assessment: Normal  Communication   Communication: Expressive difficulties  Cognition Arousal/Alertness: Awake/alert Behavior During Therapy: WFL for tasks assessed/performed Overall Cognitive Status: Impaired/Different from baseline Area of Impairment: Orientation                 Orientation Level: Disoriented to;Time(pt believes it is may 7th (confused 2/2 change in surg date))                    General Comments General comments (skin integrity, edema, etc.): VSS during session    Exercises     Assessment/Plan    PT Assessment Patient needs continued PT services  PT Problem List Decreased balance;Decreased mobility       PT Treatment Interventions Stair training;Gait training;Functional mobility training;Balance training;Neuromuscular re-education;Patient/family education    PT Goals (Current goals can be found in the Care Plan section)  Acute Rehab PT Goals Patient Stated Goal: To return to independence PT Goal Formulation: With patient Time For Goal Achievement: 08/21/19 Potential to Achieve Goals: Good Additional Goals Additional Goal #1: Pt will maintain dynamic standing balance within 10 inches of his base of support independently    Frequency Min 4X/week   Barriers to discharge        Co-evaluation               AM-PAC PT "6 Clicks" Mobility  Outcome Measure Help needed turning from your back to your side while in a flat bed without using bedrails?: None Help needed moving from lying on your back to sitting on the side of a flat bed without using bedrails?: None Help needed moving to and from a bed to a chair (including a wheelchair)?: None Help needed standing up from a chair using your arms (e.g., wheelchair or bedside chair)?: None Help needed to walk in hospital room?: None Help needed climbing 3-5 steps with a  railing? : A Little 6 Click Score: 23    End of Session   Activity Tolerance: Patient tolerated treatment well Patient left: in chair;with call bell/phone within reach;with family/visitor present;with nursing/sitter in room Nurse Communication: Mobility status PT Visit Diagnosis: Other abnormalities of gait and mobility (R26.89)    Time: 6644-0347 PT Time Calculation (min) (ACUTE ONLY): 25 min   Charges:   PT Evaluation $PT Eval Moderate Complexity: 1 Mod PT Treatments $Therapeutic Activity: 8-22 mins        Arlyss Gandy, PT, DPT Acute Rehabilitation Pager: [phone]   Arlyss Gandy 08/07/2019, 12:03 PM

## 2019-08-07 NOTE — Progress Notes (Signed)
EEG complete - results pending 

## 2019-08-07 NOTE — Procedures (Signed)
Patient Name: Victor Pena  MRN: [id number]  Epilepsy Attending: Charlsie Quest  Referring Physician/Provider: Dr. 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  EEG evaluate for seizures.  Level of alertness: Awake, drowsy  AEDs during EEG study: None  Technical aspects: This EEG study was done with scalp electrodes positioned according to the 10-20 International system of electrode placement. Electrical activity was acquired at a sampling rate of 500Hz  and reviewed with a high frequency filter of 70Hz  and a low frequency filter of 1Hz . EEG data were recorded continuously and digitally stored.   Description: The posterior dominant rhythm consists of 9 Hz activity of moderate voltage (25-35 uV) seen predominantly in posterior head regions, symmetric and reactive to eye opening and eye closing.  Drowsiness was characterized by attenuation of the posterior background rhythm and roving eye movements. Hyperventilation and photic stimulation were not performed.  IMPRESSION: This study is within normal limits. No seizures or epileptiform discharges were seen throughout the recording.  Victor Pena 

## 2019-08-07 NOTE — Plan of Care (Signed)
  Problem: Clinical Measurements: Goal: Respiratory complications will improve Outcome: Progressing Goal: Cardiovascular complication will be avoided Outcome: Progressing   Problem: Activity: Goal: Risk for activity intolerance will decrease Outcome: Progressing Note: Pt working well with PT, ambulates with no difficulties   Problem: Nutrition: Goal: Adequate nutrition will be maintained Outcome: Progressing   Problem: Coping: Goal: Level of anxiety will decrease Outcome: Progressing   Problem: Elimination: Goal: Will not experience complications related to urinary retention Outcome: Progressing   Problem: Safety: Goal: Ability to remain free from injury will improve Outcome: Progressing

## 2019-08-07 NOTE — Progress Notes (Signed)
  Echocardiogram 2D Echocardiogram has been performed.  Victor Pena 08/07/2019, 9:57 AM

## 2019-08-08 LAB — CBC
HCT: 35.6 % — ABNORMAL LOW (ref 39.0–52.0)
Hemoglobin: 11.9 g/dL — ABNORMAL LOW (ref 13.0–17.0)
MCH: 35.6 pg — ABNORMAL HIGH (ref 26.0–34.0)
MCHC: 33.4 g/dL (ref 30.0–36.0)
MCV: 106.6 fL — ABNORMAL HIGH (ref 80.0–100.0)
Platelets: 246 10*3/uL (ref 150–400)
RBC: 3.34 MIL/uL — ABNORMAL LOW (ref 4.22–5.81)
RDW: 13.2 % (ref 11.5–15.5)
WBC: 7.7 10*3/uL (ref 4.0–10.5)
nRBC: 0 % (ref 0.0–0.2)

## 2019-08-08 LAB — BASIC METABOLIC PANEL
Anion gap: 7 (ref 5–15)
BUN: 15 mg/dL (ref 8–23)
CO2: 25 mmol/L (ref 22–32)
Calcium: 8.6 mg/dL — ABNORMAL LOW (ref 8.9–10.3)
Chloride: 107 mmol/L (ref 98–111)
Creatinine, Ser: 0.86 mg/dL (ref 0.61–1.24)
GFR calc Af Amer: >60 mL/min (ref >60)
GFR calc non Af Amer: >60 mL/min (ref >60)
Glucose, Bld: 105 mg/dL — ABNORMAL HIGH (ref 70–99)
Potassium: 4.2 mmol/L (ref 3.5–5.1)
Sodium: 139 mmol/L (ref 135–145)

## 2019-08-08 LAB — PHOSPHORUS: Phosphorus: 2.3 mg/dL — ABNORMAL LOW (ref 2.5–4.6)

## 2019-08-08 LAB — MAGNESIUM: Magnesium: 1.8 mg/dL (ref 1.7–2.4)

## 2019-08-08 MED ORDER — NICOTINE 14 MG/24HR TD PT24
14.0000 mg | MEDICATED_PATCH | Freq: Every day | TRANSDERMAL | Status: DC
  Administered 2019-08-08 – 2019-08-20 (×13): 14 mg via TRANSDERMAL
  Filled 2019-08-08 (×13): qty 1

## 2019-08-08 MED ORDER — K PHOS MONO-SOD PHOS DI & MONO 155-852-130 MG PO TABS
500.0000 mg | ORAL_TABLET | Freq: Once | ORAL | Status: AC
  Administered 2019-08-08: 500 mg via ORAL
  Filled 2019-08-08: qty 2

## 2019-08-08 MED ORDER — LABETALOL HCL 5 MG/ML IV SOLN
5.0000 mg | INTRAVENOUS | Status: DC | PRN
  Administered 2019-08-19: 5 mg via INTRAVENOUS
  Filled 2019-08-08: qty 4

## 2019-08-08 NOTE — Progress Notes (Signed)
Referring Physician(s): Jaffe,Adam R  Supervising Physician: Pedro Earls  Patient Status:  [facility] - In-pt  Chief Complaint:  Trouble talking  Subjective: Transitioned to chair with assistance during visit. Did well with PT yesterday; appears to be at baseline functionally.  Speech improved, still with expressive aphasia but is intelligible and able to participate in conversation.  Reports right hand numbness improved.  Wife at bedside; several questions re: care plan which are answered.  Allergies: Other  Medications: Prior to Admission medications   Medication Sig Start Date End Date Taking? Authorizing Provider  acetaminophen (TYLENOL) 500 MG tablet Take 1,000 mg by mouth every 6 (six) hours as needed for mild pain or headache.   Yes [provider]  aspirin 81 MG EC tablet Take 81 mg by mouth daily. Swallow whole.   Yes [provider]  ibuprofen (ADVIL) 200 MG tablet Take 400 mg by mouth every 6 (six) hours as needed for moderate pain.   Yes [provider]  ticagrelor (BRILINTA) 90 MG TABS tablet Take 90 mg by mouth daily.    Yes [provider]  diazepam (VALIUM) 5 MG tablet Take 1 tablet 30 mins prior to MRI. May take second dose if needed. Patient not taking: Reported on 07/17/2019 07/02/19   Victor Sprang, MD     Vital Signs: BP (!) 115/55   Pulse 66   Temp 98.8 F (37.1 C) (Oral)   Resp (!) 9   Ht 5' 11"  (1.803 m)   Wt 190 lb (86.2 kg)   SpO2 97%   BMI 26.50 kg/m   Physical Exam Vitals and nursing note reviewed.  Constitutional:      General: [name] not in acute distress.    Appearance: Normal appearance.  HENT:     Mouth/Throat:     Mouth: Mucous membranes are moist.     Pharynx: Oropharynx is clear.  Eyes:     Extraocular Movements: Extraocular movements intact.  Pulmonary:     Effort: Pulmonary effort is normal. No respiratory distress.  Skin:    Comments: Right groin incision soft without  active bleeding or hematoma.  Neurological:     Mental Status: [name] alert.     Comments: Alert, awake, and oriented x3. Following commands.  Still with expressive aphasia which is improved-- slow speech word finding difficulty, but intelligible and able to ultimately express needs Mild right facial droop. Moving all extremities.     Imaging: MR BRAIN WO CONTRAST  Result Date: 08/07/2019 CLINICAL DATA:  Initial evaluation for worsened speech, status post coil embolization of MCA aneurysm. EXAM: MRI HEAD WITHOUT CONTRAST TECHNIQUE: Multiplanar, multiecho pulse sequences of the brain and surrounding structures were obtained without intravenous contrast. COMPARISON:  Prior head CT from 08/06/2019. FINDINGS: Brain: Mild age-related cerebral atrophy. Scattered susceptibility artifact and T1 hyperintensity seen throughout the left sylvian fissure and overlying left frontoparietal sulci, consistent with subarachnoid hemorrhage, similar to previous CT. Scattered serpiginous diffusion signal abnormality seen throughout this region, favored in large part to be related to the underlying subarachnoid blood, although superimposed small volume ischemic changes could be present as well, difficult to exclude. There are a few scattered punctate foci of diffusion abnormality involving the cortical gray matter of the parasagittal left frontal lobe (series 5, image 93) and left parietal lobe (series 5, images 86, 87, likely reflecting punctate ischemic infarcts. No frank intraparenchymal hematoma or mass effect. No other diffusion abnormality to suggest acute or subacute ischemia. Gray-white  matter differentiation otherwise maintained. No other areas of remote cortical infarction. No other evidence for acute or chronic intracranial hemorrhage. No mass lesion or midline shift. No hydrocephalus or extra-axial collection. Midline structures intact. Pituitary gland suprasellar region normal. Vascular: Susceptibility artifact  related to coiled left MCA bifurcation aneurysm. Major intracranial vascular flow voids maintained elsewhere. Skull and upper cervical spine: Craniocervical junction within normal limits. Upper cervical spine normal. Bone marrow signal intensity within normal limits. No scalp soft tissue abnormality. Sinuses/Orbits: Globes and orbital soft tissues within normal limits. Mild scattered mucosal thickening noted within the ethmoidal air cells. Paranasal sinuses are otherwise largely clear. Trace right mastoid effusion. Inner ear structures grossly normal. Other: None. IMPRESSION: 1. Scattered subarachnoid hemorrhage involving the left sylvian fissure and overlying left frontal parietal convexity, relatively stable from prior CT, and consistent with recently ruptured left MCA bifurcation aneurysm. 2. Few probable punctate ischemic nonhemorrhagic infarcts involving the left frontal and parietal lobes as above. Additional scattered serpiginous diffusion abnormality favored to in large part be related to the underlying subarachnoid hemorrhage. 3. Sequelae of interval coil embolization of left MCA bifurcation aneurysm. Electronically Signed   By: Jeannine Boga M.D.   On: 08/07/2019 02:21   IR Transcath/Emboliz  Result Date: 08/07/2019 INDICATION: 64 year old male presenting to the emergency with 1 day history of intermittent dysphasia and right hand numbness and weakness. Head CT showed a left sylvian subarachnoid hemorrhage (mFS 1). 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 He had been started on dual anti-platelet therapy with aspirin and Brilinta 5 days ago in anticipation to schedule procedure on 08/07/2019. At admission, he presented mild  expressive aphasia with no motor deficit. He was then taken to our service for emergency angiogram and left MCA aneurysm treatment. EXAM: Ultrasound-guided vascular access Diagnostic cerebral angiogram 3D rotational angiogram Balloon assisted coil embolization of brain aneurysm Flat panel CT COMPARISON:  Diagnostic cerebral angiogram July 19, 2019 MEDICATIONS: No antibiotics utilized during the procedure. A total of 5000 units of heparin IV administered. ANESTHESIA/SEDATION: The procedure was performed under general anesthesia. CONTRAST:  140 cc of Omnipaque 240 FLUOROSCOPY TIME:  Fluoroscopy Time: 72 minutes 18 seconds (3059 mGy). COMPLICATIONS: None immediate. TECHNIQUE: Informed written consent was obtained from the patient after a thorough discussion of the procedural risks, benefits and alternatives. All questions were addressed. Maximal Sterile Barrier Technique was utilized including caps, mask, sterile gowns, sterile gloves, sterile drape, hand hygiene and skin antiseptic. A timeout was performed prior to the initiation of the procedure. Real-time ultrasound guidance was utilized for vascular access including the acquisition of a permanent ultrasound image documenting patency of the accessed vessel. Using a micropuncture kit and the modified Seldinger technique, access was gained to the right common femoral artery and an 8 French sheath was placed. Then, a 5 [place] DAV catheter was navigated over a 0.035 inch Terumo Glidewire into the aortic arch under fluoroscopy. The catheter was then placed into the left common carotid artery. Frontal and lateral road map was obtained. The catheter was then navigated into the left internal artery. Frontal and lateral angiograms of the head were. 3D rotational angiograms were acquired and post processed in a separate workstation under concurrent attending physician supervision. Selected images were sent to PACS.  Magnified frontal and lateral angiograms of the head in  working projections were obtained. 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 PROCEDURE: The DAV catheter was exchanged over the wire and under biplane roadmap for a cerabase guide catheter which was placed in the cervical left ICA. A 6 [place] intermediate catheter was then navigated over the [employer] into the petrous segment of the left ICA. Magnified frontal and lateral angiograms of the head were obtained in the working projections. Using biplane roadmap, a headway duo microcatheter was navigated over a synchro 2 micro guidewire into the left MCA bifurcation aneurysm pouch. Attempted primary coiling proved unsuccessful due to wide neck. A 3 x 15 mm Transform compliant balloon was navigated over a synchro 14 micro guidewire in a parallel fashion through the [place] intermediate catheter into the left MCA M2 inferior division branch with the balloon at the level of the aneurysm neck. Then, a 4 mm x 6 cm target 360 soft framing coil was placed within the aneurysm pouch while the transform balloon was inflated. The balloon was then deflated. The coil mass remained stable within the aneurysm pouch. Control angiogram showed maintained patency of the daughter vessels. The coil was then detached. In a similar fashion, 3 filling and 2 finishing coils were placed within the aneurysm pouch (3 mm x 6 cm target 360 ultra, 3 mm x 4 cm target 360 ultra, 2.5 mm x 4 cm target 360 Nano, 1.5 mm by 4 cm target 360 nano and 1.5 mm x 4 cm target 360 nano). Control angiograms were obtained. The microcatheter was then removed followed by removal of the transform balloon. Follow-up magnified frontal and lateral angiograms of the head in the working projections were obtained showing adequate aneurysm occlusion with good coil packing density and preservation of the parent artery and daughter arteries. Delayed magnified frontal and lateral angiograms of the head in  the working projections were obtained showing stable coil mass and no evidence of clot formation on the coil mass surface. Frontal and lateral angiograms with views of the entire head were obtained. No evidence of thromboembolic complication noted. The catheter construct was subsequently withdrawn. Flat panel CT of the head was obtained and post processed in a separate workstation with concurrent attending physician supervision. Selected images were sent to PACS. Stable appearance of known is left sided subarachnoid hemorrhage with no evidence of intra procedural bleed. A right common femoral artery angiogram with right anterior oblique view was obtained. Mild atherosclerotic changes of the right common femoral artery are noted, without stenosis. The access is at the level of the common femoral artery which has appropriate caliber for closure device utilization. The 8 French sheath was removed over the wire and a Perclose ProGlide closure device was used for access closure. Immediate hemostasis was achieved. 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 PLAN: 1. Patient transferred to ICU for anesthesia recovery and vasospasm watch. 2. Brilinta can be discontinued while remaining on aspirin 81 mg. Electronically Signed   By: Pedro Earls M.D.   On: 08/07/2019 13:43   IR Angiogram Follow Up Study  Result Date: 08/07/2019 INDICATION: 64 year old male presenting to the emergency with 1 day history of intermittent dysphasia and right hand numbness and weakness. Head CT showed a left sylvian subarachnoid hemorrhage (mFS 1). 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 The bleeding pattern was consistent with rupture of his known left MCA  bifurcation aneurysm. He had been started on dual anti-platelet therapy with aspirin and Brilinta 5 days ago in anticipation to schedule procedure on 08/07/2019. At admission, he presented mild expressive aphasia with no motor deficit. He was then taken to our service for emergency angiogram and left MCA aneurysm treatment. EXAM: Ultrasound-guided vascular access Diagnostic cerebral angiogram 3D rotational angiogram Balloon assisted coil embolization of brain aneurysm Flat panel CT COMPARISON:  Diagnostic cerebral angiogram July 19, 2019 MEDICATIONS: No antibiotics utilized during the procedure. A total of 5000 units of heparin IV administered. ANESTHESIA/SEDATION: The procedure was performed under general anesthesia. CONTRAST:  140 cc of Omnipaque 240 FLUOROSCOPY TIME:  Fluoroscopy Time: 72 minutes 18 seconds (3059 mGy). COMPLICATIONS: None immediate. TECHNIQUE: Informed written consent was obtained from the patient after a thorough discussion of the procedural risks, benefits and alternatives. All questions were addressed. Maximal Sterile Barrier Technique was utilized including caps, mask, sterile gowns, sterile gloves, sterile drape, hand hygiene and skin antiseptic. A timeout was performed prior to the initiation of the procedure. Real-time ultrasound guidance was utilized for vascular access including the acquisition of a permanent ultrasound image documenting patency of the accessed vessel. Using a micropuncture kit and the modified Seldinger technique, access was gained to the right common femoral artery and an 8 French sheath was placed. Then, a 5 [place] DAV catheter was navigated over a 0.035 inch Terumo Glidewire into the aortic arch under fluoroscopy. The catheter was then placed into the left common carotid artery. Frontal and lateral road map was obtained. The catheter was then navigated into the left internal artery. Frontal and lateral angiograms of the head were. 3D rotational angiograms were  acquired and post processed in a separate workstation under concurrent attending physician supervision. Selected images were sent to PACS. Magnified frontal and lateral angiograms of the head in working projections were obtained. 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 PROCEDURE: The DAV catheter was exchanged over the wire and under biplane roadmap for a cerabase guide catheter which was placed in the cervical left ICA. A 6 [place] intermediate catheter was then navigated over the [employer] into the petrous segment of the left ICA. Magnified frontal and lateral angiograms of the head were obtained in the working projections. Using biplane roadmap, a headway duo microcatheter was navigated over a synchro 2 micro guidewire into the left MCA bifurcation aneurysm pouch. Attempted primary coiling proved unsuccessful due to wide neck. A 3 x 15 mm Transform compliant balloon was navigated over a synchro 14 micro guidewire in a parallel fashion through the [place] intermediate catheter into the left MCA M2 inferior division branch with the balloon at the level of the aneurysm neck. Then, a 4 mm x 6 cm target 360 soft framing coil was placed within the aneurysm pouch while the transform balloon was inflated. The balloon was then deflated. The coil mass remained stable within the aneurysm pouch. Control angiogram showed maintained patency of the daughter vessels. The coil was then detached. In a similar fashion, 3 filling and 2 finishing coils were placed within the aneurysm pouch (3 mm x 6 cm target 360 ultra, 3 mm x 4 cm target 360 ultra, 2.5 mm x 4 cm  target 360 Nano, 1.5 mm by 4 cm target 360 nano and 1.5 mm x 4 cm target 360 nano). Control angiograms were obtained. The microcatheter was then removed followed by removal of the transform balloon. Follow-up magnified frontal and lateral angiograms of the head in the working projections were obtained  showing adequate aneurysm occlusion with good coil packing density and preservation of the parent artery and daughter arteries. Delayed magnified frontal and lateral angiograms of the head in the working projections were obtained showing stable coil mass and no evidence of clot formation on the coil mass surface. Frontal and lateral angiograms with views of the entire head were obtained. No evidence of thromboembolic complication noted. The catheter construct was subsequently withdrawn. Flat panel CT of the head was obtained and post processed in a separate workstation with concurrent attending physician supervision. Selected images were sent to PACS. Stable appearance of known is left sided subarachnoid hemorrhage with no evidence of intra procedural bleed. A right common femoral artery angiogram with right anterior oblique view was obtained. Mild atherosclerotic changes of the right common femoral artery are noted, without stenosis. The access is at the level of the common femoral artery which has appropriate caliber for closure device utilization. The 8 French sheath was removed over the wire and a Perclose ProGlide closure device was used for access closure. Immediate hemostasis was achieved. 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 PLAN: 1. Patient transferred to ICU for anesthesia recovery and vasospasm watch. 2. Brilinta can be discontinued while remaining on aspirin 81 mg. Electronically Signed   By: Pedro Earls M.D.   On: 08/07/2019 13:43   IR Angiogram Follow Up Study  Result Date: 08/07/2019 INDICATION: 64 year old male presenting to the emergency with 1 day history of intermittent dysphasia and right hand numbness and weakness. Head CT showed a left sylvian subarachnoid hemorrhage (mFS 1). 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 He had been started on dual anti-platelet therapy with aspirin and Brilinta 5 days ago in anticipation to schedule procedure on 08/07/2019. At admission, he presented mild expressive aphasia with no motor deficit. He was then taken to our service for emergency angiogram and left MCA aneurysm treatment. EXAM: Ultrasound-guided vascular access Diagnostic cerebral angiogram 3D rotational angiogram Balloon assisted coil embolization of brain aneurysm Flat panel CT COMPARISON:  Diagnostic cerebral angiogram July 19, 2019 MEDICATIONS: No antibiotics utilized during the procedure. A total of 5000 units of heparin IV administered. ANESTHESIA/SEDATION: The procedure was performed under general anesthesia. CONTRAST:  140 cc of Omnipaque 240 FLUOROSCOPY TIME:  Fluoroscopy Time: 72 minutes 18 seconds (3059 mGy). COMPLICATIONS: None immediate. TECHNIQUE: Informed written consent was obtained from the patient after a thorough discussion of the procedural risks, benefits and alternatives. All questions were addressed. Maximal Sterile Barrier Technique was utilized including caps, mask, sterile gowns, sterile gloves, sterile drape, hand hygiene and skin antiseptic. A timeout was performed prior to the initiation of the procedure. Real-time ultrasound guidance was utilized for vascular access including the acquisition of a permanent ultrasound image documenting patency of the accessed vessel. Using a micropuncture kit and the modified Seldinger technique, access was gained to the right common  femoral artery and an 8 French sheath was placed. Then, a 5 [place] DAV catheter was navigated over a 0.035 inch Terumo Glidewire into the aortic arch under fluoroscopy. The catheter was then placed into the left common carotid artery.  Frontal and lateral road map was obtained. The catheter was then navigated into the left internal artery. Frontal and lateral angiograms of the head were. 3D rotational angiograms were acquired and post processed in a separate workstation under concurrent attending physician supervision. Selected images were sent to PACS. Magnified frontal and lateral angiograms of the head in working projections were obtained. 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 PROCEDURE: The DAV catheter was exchanged over the wire and under biplane roadmap for a cerabase guide catheter which was placed in the cervical left ICA. A 6 [place] intermediate catheter was then navigated over the [employer] into the petrous segment of the left ICA. Magnified frontal and lateral angiograms of the head were obtained in the working projections. Using biplane roadmap, a headway duo microcatheter was navigated over a synchro 2 micro guidewire into the left MCA bifurcation aneurysm pouch. Attempted primary coiling proved unsuccessful due to wide neck. A 3 x 15 mm Transform compliant balloon was navigated over a synchro 14 micro guidewire in a parallel fashion through the [place] intermediate catheter into the left MCA M2 inferior division branch with the balloon at the level of the aneurysm neck. Then, a 4 mm x 6 cm target 360 soft framing coil was placed within the aneurysm pouch while the transform balloon was inflated. The balloon was then deflated. The coil mass remained stable within the aneurysm pouch. Control angiogram showed maintained patency of the daughter vessels. The coil was then detached. In a similar fashion, 3 filling and 2 finishing coils were placed within the aneurysm pouch (3 mm x 6 cm target 360 ultra, 3 mm x 4 cm target 360 ultra, 2.5 mm x 4 cm target 360 Nano, 1.5 mm by 4 cm target 360 nano and 1.5 mm x 4 cm target 360 nano). Control angiograms were obtained.  The microcatheter was then removed followed by removal of the transform balloon. Follow-up magnified frontal and lateral angiograms of the head in the working projections were obtained showing adequate aneurysm occlusion with good coil packing density and preservation of the parent artery and daughter arteries. Delayed magnified frontal and lateral angiograms of the head in the working projections were obtained showing stable coil mass and no evidence of clot formation on the coil mass surface. Frontal and lateral angiograms with views of the entire head were obtained. No evidence of thromboembolic complication noted. The catheter construct was subsequently withdrawn. Flat panel CT of the head was obtained and post processed in a separate workstation with concurrent attending physician supervision. Selected images were sent to PACS. Stable appearance of known is left sided subarachnoid hemorrhage with no evidence of intra procedural bleed. A right common femoral artery angiogram with right anterior oblique view was obtained. Mild atherosclerotic changes of the right common femoral artery are noted, without stenosis. The access is at the level of the common femoral artery which has appropriate caliber for closure device utilization. The 8 French sheath was removed over the wire and a Perclose ProGlide closure device was used for access closure. Immediate hemostasis was achieved. 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 PLAN: 1. Patient  transferred to ICU for anesthesia recovery and vasospasm watch. 2. Brilinta can be discontinued while remaining on aspirin 81 mg. Electronically Signed   By: Pedro Earls M.D.   On: 08/07/2019 13:43   IR Angiogram Follow Up Study  Result Date: 08/07/2019 INDICATION: 64 year old male presenting to the emergency with 1 day history of intermittent dysphasia and right hand numbness  and weakness. Head CT showed a left sylvian subarachnoid hemorrhage (mFS 1). 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 He had been started on dual anti-platelet therapy with aspirin and Brilinta 5 days ago in anticipation to schedule procedure on 08/07/2019. At admission, he presented mild expressive aphasia with no motor deficit. He was then taken to our service for emergency angiogram and left MCA aneurysm treatment. EXAM: Ultrasound-guided vascular access Diagnostic cerebral angiogram 3D rotational angiogram Balloon assisted coil embolization of brain aneurysm Flat panel CT COMPARISON:  Diagnostic cerebral angiogram July 19, 2019 MEDICATIONS: No antibiotics utilized during the procedure. A total of 5000 units of heparin IV administered. ANESTHESIA/SEDATION: The procedure was performed under general anesthesia. CONTRAST:  140 cc of Omnipaque 240 FLUOROSCOPY TIME:  Fluoroscopy Time: 72 minutes 18 seconds (3059 mGy). COMPLICATIONS: None immediate. TECHNIQUE: Informed written consent was obtained from the patient after a thorough discussion of the procedural risks, benefits and alternatives. All questions were addressed. Maximal Sterile Barrier Technique was utilized including caps, mask, sterile gowns, sterile gloves, sterile drape, hand hygiene and skin antiseptic. A timeout was performed prior to the initiation of the procedure. Real-time ultrasound guidance was utilized for vascular access including the acquisition of a permanent ultrasound image documenting patency of the accessed vessel. Using a micropuncture kit and the modified Seldinger technique, access was gained to the right common femoral artery and an 8 French sheath was placed.  Then, a 5 [place] DAV catheter was navigated over a 0.035 inch Terumo Glidewire into the aortic arch under fluoroscopy. The catheter was then placed into the left common carotid artery. Frontal and lateral road map was obtained. The catheter was then navigated into the left internal artery. Frontal and lateral angiograms of the head were. 3D rotational angiograms were acquired and post processed in a separate workstation under concurrent attending physician supervision. Selected images were sent to PACS. Magnified frontal and lateral angiograms of the head in working projections were obtained. 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 PROCEDURE: The DAV catheter was exchanged over the wire and under biplane roadmap for a cerabase guide catheter which was placed in the cervical left ICA. A 6 [place] intermediate catheter was then navigated over the [employer] into the petrous segment of the left ICA. Magnified frontal and lateral angiograms of the head were obtained in the working projections. Using biplane roadmap, a headway duo microcatheter was navigated over a synchro 2 micro guidewire into the left MCA bifurcation aneurysm pouch. Attempted primary coiling proved unsuccessful due to wide neck. A 3 x 15 mm Transform compliant balloon was navigated over a synchro 14 micro guidewire in a parallel fashion through the [place] intermediate catheter into the left MCA M2 inferior division branch with the balloon at the level of the aneurysm neck. Then,  a 4 mm x 6 cm target 360 soft framing coil was placed within the aneurysm pouch while the transform balloon was inflated. The balloon was then deflated. The coil mass remained stable within the aneurysm pouch. Control angiogram showed maintained patency of the daughter vessels. The coil was then detached. In a similar fashion, 3 filling and 2 finishing coils were placed within the aneurysm pouch (3 mm x  6 cm target 360 ultra, 3 mm x 4 cm target 360 ultra, 2.5 mm x 4 cm target 360 Nano, 1.5 mm by 4 cm target 360 nano and 1.5 mm x 4 cm target 360 nano). Control angiograms were obtained. The microcatheter was then removed followed by removal of the transform balloon. Follow-up magnified frontal and lateral angiograms of the head in the working projections were obtained showing adequate aneurysm occlusion with good coil packing density and preservation of the parent artery and daughter arteries. Delayed magnified frontal and lateral angiograms of the head in the working projections were obtained showing stable coil mass and no evidence of clot formation on the coil mass surface. Frontal and lateral angiograms with views of the entire head were obtained. No evidence of thromboembolic complication noted. The catheter construct was subsequently withdrawn. Flat panel CT of the head was obtained and post processed in a separate workstation with concurrent attending physician supervision. Selected images were sent to PACS. Stable appearance of known is left sided subarachnoid hemorrhage with no evidence of intra procedural bleed. A right common femoral artery angiogram with right anterior oblique view was obtained. Mild atherosclerotic changes of the right common femoral artery are noted, without stenosis. The access is at the level of the common femoral artery which has appropriate caliber for closure device utilization. The 8 French sheath was removed over the wire and a Perclose ProGlide closure device was used for access closure. Immediate hemostasis was achieved. 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 PLAN: 1. Patient transferred to ICU for anesthesia recovery and vasospasm watch. 2. Brilinta can be discontinued while remaining on aspirin 81 mg. Electronically Signed   By: Pedro Earls M.D.   On: 08/07/2019 13:43    IR Angiogram Follow Up Study  Result Date: 08/07/2019 INDICATION: 64 year old male presenting to the emergency with 1 day history of intermittent dysphasia and right hand numbness and weakness. Head CT showed a left sylvian subarachnoid hemorrhage (mFS 1). 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 He had been started on dual anti-platelet therapy with aspirin and Brilinta 5 days ago in anticipation to schedule procedure on 08/07/2019. At admission, he presented mild expressive aphasia with no motor deficit. He was then taken to our service for emergency angiogram and left MCA aneurysm treatment. EXAM: Ultrasound-guided vascular access Diagnostic cerebral angiogram 3D rotational angiogram Balloon assisted coil embolization of brain aneurysm Flat panel CT COMPARISON:  Diagnostic cerebral angiogram July 19, 2019 MEDICATIONS: No antibiotics utilized during the procedure. A total of 5000 units of heparin IV administered. ANESTHESIA/SEDATION: The procedure was performed under general anesthesia. CONTRAST:  140 cc of Omnipaque 240 FLUOROSCOPY TIME:  Fluoroscopy Time: 72 minutes 18 seconds (3059 mGy). COMPLICATIONS: None immediate. TECHNIQUE: Informed written consent was  obtained from the patient after a thorough discussion of the procedural risks, benefits and alternatives. All questions were addressed. Maximal Sterile Barrier Technique was utilized including caps, mask, sterile gowns, sterile gloves, sterile drape, hand hygiene and skin antiseptic. A timeout was performed prior to the initiation of the procedure. Real-time ultrasound guidance was utilized for vascular access including the acquisition of a permanent ultrasound image documenting  patency of the accessed vessel. Using a micropuncture kit and the modified Seldinger technique, access was gained to the right common femoral artery and an 8 French sheath was placed. Then, a 5 [place] DAV catheter was navigated over a 0.035 inch Terumo Glidewire into the aortic arch under fluoroscopy. The catheter was then placed into the left common carotid artery. Frontal and lateral road map was obtained. The catheter was then navigated into the left internal artery. Frontal and lateral angiograms of the head were. 3D rotational angiograms were acquired and post processed in a separate workstation under concurrent attending physician supervision. Selected images were sent to PACS. Magnified frontal and lateral angiograms of the head in working projections were obtained. 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 PROCEDURE: The DAV catheter was exchanged over the wire and under biplane roadmap for a cerabase guide catheter which was placed in the cervical left ICA. A 6 [place] intermediate catheter was then navigated over the [employer] into the petrous segment of the left ICA. Magnified frontal and lateral angiograms of the head were obtained in the working projections. Using biplane roadmap, a headway duo microcatheter was navigated over a synchro 2 micro guidewire into the left MCA bifurcation aneurysm pouch. Attempted primary coiling proved unsuccessful due to wide neck. A 3 x 15 mm Transform compliant balloon was navigated over a synchro 14 micro guidewire in a parallel fashion through the [place] intermediate catheter into the left MCA M2 inferior division branch with the balloon at the level of the aneurysm neck. Then, a 4 mm x 6 cm target 360 soft framing coil was placed within the aneurysm pouch while the transform balloon was inflated. The balloon was then deflated. The coil mass remained stable within the aneurysm pouch. Control  angiogram showed maintained patency of the daughter vessels. The coil was then detached. In a similar fashion, 3 filling and 2 finishing coils were placed within the aneurysm pouch (3 mm x 6 cm target 360 ultra, 3 mm x 4 cm target 360 ultra, 2.5 mm x 4 cm target 360 Nano, 1.5 mm by 4 cm target 360 nano and 1.5 mm x 4 cm target 360 nano). Control angiograms were obtained. The microcatheter was then removed followed by removal of the transform balloon. Follow-up magnified frontal and lateral angiograms of the head in the working projections were obtained showing adequate aneurysm occlusion with good coil packing density and preservation of the parent artery and daughter arteries. Delayed magnified frontal and lateral angiograms of the head in the working projections were obtained showing stable coil mass and no evidence of clot formation on the coil mass surface. Frontal and lateral angiograms with views of the entire head were obtained. No evidence of thromboembolic complication noted. The catheter construct was subsequently withdrawn. Flat panel CT of the head was obtained and post processed in a separate workstation with concurrent attending physician supervision. Selected images were sent to PACS. Stable appearance of known is left sided subarachnoid hemorrhage with no evidence of intra procedural bleed. A right  common femoral artery angiogram with right anterior oblique view was obtained. Mild atherosclerotic changes of the right common femoral artery are noted, without stenosis. The access is at the level of the common femoral artery which has appropriate caliber for closure device utilization. The 8 French sheath was removed over the wire and a Perclose ProGlide closure device was used for access closure. Immediate hemostasis was achieved. 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 PLAN: 1. Patient transferred to ICU for  anesthesia recovery and vasospasm watch. 2. Brilinta can be discontinued while remaining on aspirin 81 mg. Electronically Signed   By: Pedro Earls M.D.   On: 08/07/2019 13:43   IR Angiogram Follow Up Study  Result Date: 08/07/2019 INDICATION: 64 year old male presenting to the emergency with 1 day history of intermittent dysphasia and right hand numbness and weakness. Head CT showed a left sylvian subarachnoid hemorrhage (mFS 1). 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 He had been started on dual anti-platelet therapy with aspirin and Brilinta 5 days ago in anticipation to schedule procedure on 08/07/2019. At admission, he presented mild expressive aphasia with no motor deficit. He was then taken to our service for emergency angiogram and left MCA aneurysm treatment. EXAM: Ultrasound-guided vascular access Diagnostic cerebral angiogram 3D rotational angiogram Balloon assisted coil embolization of brain aneurysm Flat panel CT COMPARISON:  Diagnostic cerebral angiogram July 19, 2019 MEDICATIONS: No antibiotics utilized during the procedure. A total of 5000 units of heparin IV administered. ANESTHESIA/SEDATION: The procedure was performed under general anesthesia. CONTRAST:  140 cc of Omnipaque 240 FLUOROSCOPY TIME:  Fluoroscopy Time: 72 minutes 18 seconds (3059 mGy). COMPLICATIONS: None immediate. TECHNIQUE: Informed written consent was obtained from the patient after a thorough discussion of the procedural risks, benefits and alternatives. All questions were addressed. Maximal Sterile Barrier Technique was utilized including caps, mask, sterile gowns, sterile gloves, sterile drape, hand hygiene and skin antiseptic. A timeout  was performed prior to the initiation of the procedure. Real-time ultrasound guidance was utilized for vascular access including the acquisition of a permanent ultrasound image documenting patency of the accessed vessel. Using a micropuncture kit and the modified Seldinger technique, access was gained to the right common femoral artery and an 8 French sheath was placed. Then, a 5 [place] DAV catheter was navigated over a 0.035 inch Terumo Glidewire into the aortic arch under fluoroscopy. The catheter was then placed into the left common carotid artery. Frontal and lateral road map was obtained. The catheter was then navigated into the left internal artery. Frontal and lateral angiograms of the head were. 3D rotational angiograms were acquired and post processed in a separate workstation under concurrent attending physician supervision. Selected images were sent to PACS. Magnified frontal and lateral angiograms of the head in working projections were obtained. 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 PROCEDURE: The DAV catheter was exchanged over the wire and under biplane roadmap for a cerabase guide catheter which was placed in the cervical left ICA. A 6 [place] intermediate catheter was then navigated over the [employer] into the petrous segment of the left ICA. Magnified  frontal and lateral angiograms of the head were obtained in the working projections. Using biplane roadmap, a headway duo microcatheter was navigated over a synchro 2 micro guidewire into the left MCA bifurcation aneurysm pouch. Attempted primary coiling proved unsuccessful due to wide neck. A 3 x 15 mm Transform compliant balloon was navigated over a synchro 14 micro guidewire in a parallel fashion through the [place] intermediate catheter into the left MCA M2 inferior division branch with the balloon at the level of the aneurysm neck. Then, a 4 mm x 6 cm target 360 soft  framing coil was placed within the aneurysm pouch while the transform balloon was inflated. The balloon was then deflated. The coil mass remained stable within the aneurysm pouch. Control angiogram showed maintained patency of the daughter vessels. The coil was then detached. In a similar fashion, 3 filling and 2 finishing coils were placed within the aneurysm pouch (3 mm x 6 cm target 360 ultra, 3 mm x 4 cm target 360 ultra, 2.5 mm x 4 cm target 360 Nano, 1.5 mm by 4 cm target 360 nano and 1.5 mm x 4 cm target 360 nano). Control angiograms were obtained. The microcatheter was then removed followed by removal of the transform balloon. Follow-up magnified frontal and lateral angiograms of the head in the working projections were obtained showing adequate aneurysm occlusion with good coil packing density and preservation of the parent artery and daughter arteries. Delayed magnified frontal and lateral angiograms of the head in the working projections were obtained showing stable coil mass and no evidence of clot formation on the coil mass surface. Frontal and lateral angiograms with views of the entire head were obtained. No evidence of thromboembolic complication noted. The catheter construct was subsequently withdrawn. Flat panel CT of the head was obtained and post processed in a separate workstation with concurrent attending physician supervision. Selected images were sent to PACS. Stable appearance of known is left sided subarachnoid hemorrhage with no evidence of intra procedural bleed. A right common femoral artery angiogram with right anterior oblique view was obtained. Mild atherosclerotic changes of the right common femoral artery are noted, without stenosis. The access is at the level of the common femoral artery which has appropriate caliber for closure device utilization. The 8 French sheath was removed over the wire and a Perclose ProGlide closure device was used for access closure. Immediate hemostasis  was achieved. 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 PLAN: 1. Patient transferred to ICU for anesthesia recovery and vasospasm watch. 2. Brilinta can be discontinued while remaining on aspirin 81 mg. Electronically Signed   By: Pedro Earls M.D.   On: 08/07/2019 13:43   IR Angiogram Follow Up Study  Result Date: 08/07/2019 INDICATION: 64 year old male presenting to the emergency with 1 day history of intermittent dysphasia and right hand numbness and weakness. Head CT showed a left sylvian subarachnoid hemorrhage (mFS 1). 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 He had been started on dual anti-platelet therapy with aspirin and Brilinta 5 days ago in anticipation to schedule procedure on 08/07/2019. At admission, he presented mild expressive aphasia with no motor deficit. He was then taken to  our service for emergency angiogram and left MCA aneurysm treatment. EXAM: Ultrasound-guided vascular access Diagnostic cerebral angiogram 3D rotational angiogram Balloon assisted coil embolization of brain aneurysm Flat panel CT COMPARISON:  Diagnostic cerebral angiogram July 19, 2019 MEDICATIONS: No antibiotics utilized during the procedure. A total of 5000 units of heparin IV administered. ANESTHESIA/SEDATION: The procedure was performed under general anesthesia. CONTRAST:  140 cc of Omnipaque 240 FLUOROSCOPY TIME:  Fluoroscopy Time: 72 minutes 18 seconds (3059 mGy). COMPLICATIONS: None immediate. TECHNIQUE: Informed written consent was obtained from the patient after a thorough discussion of the procedural risks, benefits and alternatives. All  questions were addressed. Maximal Sterile Barrier Technique was utilized including caps, mask, sterile gowns, sterile gloves, sterile drape, hand hygiene and skin antiseptic. A timeout was performed prior to the initiation of the procedure. Real-time ultrasound guidance was utilized for vascular access including the acquisition of a permanent ultrasound image documenting patency of the accessed vessel. Using a micropuncture kit and the modified Seldinger technique, access was gained to the right common femoral artery and an 8 French sheath was placed. Then, a 5 [place] DAV catheter was navigated over a 0.035 inch Terumo Glidewire into the aortic arch under fluoroscopy. The catheter was then placed into the left common carotid artery. Frontal and lateral road map was obtained. The catheter was then navigated into the left internal artery. Frontal and lateral angiograms of the head were. 3D rotational angiograms were acquired and post processed in a separate workstation under concurrent attending physician supervision. Selected images were sent to PACS. Magnified frontal and lateral angiograms of the head in working projections were obtained. 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 PROCEDURE: The DAV catheter was exchanged over the wire and under biplane roadmap for a cerabase guide catheter which was placed in the cervical left ICA. A 6 [place] intermediate catheter was then navigated over the [employer] into the petrous segment of the left ICA. Magnified frontal and lateral angiograms of the head were obtained in the working projections. Using biplane roadmap, a headway duo microcatheter was navigated over a synchro 2 micro guidewire into the left MCA bifurcation aneurysm pouch. Attempted primary coiling proved unsuccessful due to wide neck. A 3 x 15 mm Transform compliant balloon was navigated over a synchro 14 micro guidewire in a parallel  fashion through the [place] intermediate catheter into the left MCA M2 inferior division branch with the balloon at the level of the aneurysm neck. Then, a 4 mm x 6 cm target 360 soft framing coil was placed within the aneurysm pouch while the transform balloon was inflated. The balloon was then deflated. The coil mass remained stable within the aneurysm pouch. Control angiogram showed maintained patency of the daughter vessels. The coil was then detached. In a similar fashion, 3 filling and 2 finishing coils were placed within the aneurysm pouch (3 mm x 6 cm target 360 ultra, 3 mm x 4 cm target 360 ultra, 2.5 mm x 4 cm target 360 Nano, 1.5 mm by 4 cm target 360 nano and 1.5 mm x 4 cm target 360 nano). Control angiograms were obtained. The microcatheter was then removed followed by removal of the transform balloon. Follow-up magnified frontal and lateral angiograms of the head in the working projections were obtained showing adequate aneurysm occlusion with good coil packing density and preservation of the parent artery and daughter arteries. Delayed magnified frontal and lateral angiograms of the head in  the working projections were obtained showing stable coil mass and no evidence of clot formation on the coil mass surface. Frontal and lateral angiograms with views of the entire head were obtained. No evidence of thromboembolic complication noted. The catheter construct was subsequently withdrawn. Flat panel CT of the head was obtained and post processed in a separate workstation with concurrent attending physician supervision. Selected images were sent to PACS. Stable appearance of known is left sided subarachnoid hemorrhage with no evidence of intra procedural bleed. A right common femoral artery angiogram with right anterior oblique view was obtained. Mild atherosclerotic changes of the right common femoral artery are noted, without stenosis. The access is at the level of the common femoral artery which has  appropriate caliber for closure device utilization. The 8 [place] sheath was removed over the wire and a Perclose ProGlide closure device was used for access closure. Immediate hemostasis was achieved. 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 PLAN: 1. Patient transferred to ICU for anesthesia recovery and vasospasm watch. 2. Brilinta can be discontinued while remaining on aspirin 81 mg. Electronically Signed   By: Pedro Earls M.D.   On: 08/07/2019 13:43   IR 3D Independent Darreld Mclean  Result Date: 08/07/2019 INDICATION: 64 year old male presenting to the emergency with 1 day history of intermittent dysphasia and right hand numbness and weakness. Head CT showed a left sylvian subarachnoid hemorrhage (mFS 1). 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 He had been started on dual anti-platelet therapy with aspirin and Brilinta 5 days ago in anticipation to schedule procedure on 08/07/2019. At admission, he presented mild expressive aphasia with no motor deficit. He was then taken to our service for emergency angiogram and left MCA aneurysm treatment. EXAM: Ultrasound-guided vascular access Diagnostic cerebral angiogram 3D rotational angiogram Balloon assisted coil embolization of brain aneurysm Flat panel CT COMPARISON:  Diagnostic cerebral angiogram July 19, 2019 MEDICATIONS: No antibiotics utilized during the procedure. A total of 5000 units of heparin IV administered. ANESTHESIA/SEDATION: The procedure was performed under general anesthesia. CONTRAST:  140 cc of Omnipaque 240 FLUOROSCOPY TIME:  Fluoroscopy Time: 72 minutes 18 seconds (3059 mGy).  COMPLICATIONS: None immediate. TECHNIQUE: Informed written consent was obtained from the patient after a thorough discussion of the procedural risks, benefits and alternatives. All questions were addressed. Maximal Sterile Barrier Technique was utilized including caps, mask, sterile gowns, sterile gloves, sterile drape, hand hygiene and skin antiseptic. A timeout was performed prior to the initiation of the procedure. Real-time ultrasound guidance was utilized for vascular access including the acquisition of a permanent ultrasound image documenting patency of the accessed vessel. Using a micropuncture kit and the modified Seldinger technique, access was gained to the right common femoral artery and an 8 French sheath was placed. Then, a 5 [place] DAV catheter was navigated over a 0.035 inch Terumo Glidewire into the aortic arch under fluoroscopy. The catheter was then placed into the left common carotid artery. Frontal and lateral road map was obtained. The catheter was then navigated into the left internal artery. Frontal and lateral angiograms of the head were. 3D rotational angiograms were acquired and post processed in a separate workstation under concurrent attending  physician supervision. Selected images were sent to PACS. Magnified frontal and lateral angiograms of the head in working projections were obtained. 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 PROCEDURE: The DAV catheter was exchanged over the wire and under biplane roadmap for a cerabase guide catheter which was placed in the cervical left ICA. A 6 [place] intermediate catheter was then navigated over the [employer] into the petrous segment of the left ICA. Magnified frontal and lateral angiograms of the head were obtained in the working projections. Using biplane roadmap, a headway duo microcatheter was navigated over a synchro 2 micro guidewire into the left MCA bifurcation  aneurysm pouch. Attempted primary coiling proved unsuccessful due to wide neck. A 3 x 15 mm Transform compliant balloon was navigated over a synchro 14 micro guidewire in a parallel fashion through the [place] intermediate catheter into the left MCA M2 inferior division branch with the balloon at the level of the aneurysm neck. Then, a 4 mm x 6 cm target 360 soft framing coil was placed within the aneurysm pouch while the transform balloon was inflated. The balloon was then deflated. The coil mass remained stable within the aneurysm pouch. Control angiogram showed maintained patency of the daughter vessels. The coil was then detached. In a similar fashion, 3 filling and 2 finishing coils were placed within the aneurysm pouch (3 mm x 6 cm target 360 ultra, 3 mm x 4 cm target 360 ultra, 2.5 mm x 4 cm target 360 Nano, 1.5 mm by 4 cm target 360 nano and 1.5 mm x 4 cm target 360 nano). Control angiograms were obtained. The microcatheter was then removed followed by removal of the transform balloon. Follow-up magnified frontal and lateral angiograms of the head in the working projections were obtained showing adequate aneurysm occlusion with good coil packing density and preservation of the parent artery and daughter arteries. Delayed magnified frontal and lateral angiograms of the head in the working projections were obtained showing stable coil mass and no evidence of clot formation on the coil mass surface. Frontal and lateral angiograms with views of the entire head were obtained. No evidence of thromboembolic complication noted. The catheter construct was subsequently withdrawn. Flat panel CT of the head was obtained and post processed in a separate workstation with concurrent attending physician supervision. Selected images were sent to PACS. Stable appearance of known is left sided subarachnoid hemorrhage with no evidence of intra procedural bleed. A right common femoral artery angiogram with right anterior oblique  view was obtained. Mild atherosclerotic changes of the right common femoral artery are noted, without stenosis. The access is at the level of the common femoral artery which has appropriate caliber for closure device utilization. The 8 French sheath was removed over the wire and a Perclose ProGlide closure device was used for access closure. Immediate hemostasis was achieved. 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 PLAN: 1. Patient transferred to ICU for anesthesia recovery and vasospasm watch. 2. Brilinta can be discontinued while remaining on aspirin 81 mg. Electronically Signed   By: Pedro Earls M.D.   On: 08/07/2019 13:43   IR CT Head Ltd  Result Date: 08/07/2019 INDICATION: 64 year old male presenting to the emergency with 1 day history of intermittent dysphasia and right hand numbness and weakness. Head CT showed a left sylvian subarachnoid hemorrhage (mFS 1). 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 He had been started on dual anti-platelet therapy with aspirin and Brilinta 5 days ago in anticipation to schedule procedure on 08/07/2019. At admission, he presented mild expressive aphasia with no motor deficit. He was then taken to our service for emergency angiogram and left MCA aneurysm treatment. EXAM: Ultrasound-guided vascular access Diagnostic cerebral angiogram 3D rotational angiogram Balloon assisted coil embolization of brain aneurysm Flat panel CT COMPARISON:  Diagnostic cerebral angiogram July 19, 2019 MEDICATIONS: No antibiotics utilized during the procedure. A total of 5000 units of heparin IV administered.  ANESTHESIA/SEDATION: The procedure was performed under general anesthesia. CONTRAST:  140 cc of Omnipaque 240 FLUOROSCOPY TIME:  Fluoroscopy Time: 72 minutes 18 seconds (3059 mGy). COMPLICATIONS: None immediate. TECHNIQUE: Informed written consent was obtained from the patient after a thorough discussion of the procedural risks, benefits and alternatives. All questions were addressed. Maximal Sterile Barrier Technique was utilized including caps, mask, sterile gowns, sterile gloves, sterile drape, hand hygiene and skin antiseptic. A timeout was performed prior to the initiation of the procedure. Real-time ultrasound guidance was utilized for vascular access including the acquisition of a permanent ultrasound image documenting patency of the accessed vessel. Using a micropuncture kit and the modified Seldinger technique, access was gained to the right common femoral artery and an 8 French sheath was placed. Then, a 5 [place] DAV catheter was navigated over a 0.035 inch Terumo Glidewire into the aortic arch under fluoroscopy. The catheter was then placed into the left common carotid artery. Frontal and lateral road map was obtained. The catheter was then navigated into the left internal artery. Frontal and lateral angiograms of the head were. 3D rotational angiograms were acquired and post processed in a separate workstation under concurrent attending physician supervision. Selected images were sent to PACS. Magnified frontal and lateral angiograms of the head in working projections were obtained. 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 PROCEDURE: The DAV catheter was exchanged over the wire and under biplane roadmap for a cerabase guide catheter which was placed in the cervical left ICA. A 6 [place] intermediate catheter was then navigated over the [employer] into the petrous segment of the left ICA. Magnified frontal and lateral angiograms of  the head were obtained in the working projections. Using biplane roadmap, a headway duo microcatheter was navigated over a synchro 2 micro guidewire into the left MCA bifurcation aneurysm pouch. Attempted primary coiling proved unsuccessful due to wide neck. A 3 x 15 mm Transform compliant balloon was navigated over a synchro 14 micro guidewire in a parallel fashion through the [place] intermediate catheter into the left MCA M2 inferior division branch with the balloon at the level of the aneurysm neck. Then, a 4 mm x 6 cm target 360 soft framing coil was placed within the aneurysm pouch while the transform balloon was inflated. The balloon was then deflated. The coil mass remained stable within the aneurysm pouch. Control angiogram showed maintained patency of the daughter vessels. The coil was then detached. In a similar fashion, 3 filling and 2 finishing coils were placed within the aneurysm pouch (3 mm x 6 cm target 360 ultra, 3 mm x 4 cm target  360 ultra, 2.5 mm x 4 cm target 360 Nano, 1.5 mm by 4 cm target 360 nano and 1.5 mm x 4 cm target 360 nano). Control angiograms were obtained. The microcatheter was then removed followed by removal of the transform balloon. Follow-up magnified frontal and lateral angiograms of the head in the working projections were obtained showing adequate aneurysm occlusion with good coil packing density and preservation of the parent artery and daughter arteries. Delayed magnified frontal and lateral angiograms of the head in the working projections were obtained showing stable coil mass and no evidence of clot formation on the coil mass surface. Frontal and lateral angiograms with views of the entire head were obtained. No evidence of thromboembolic complication noted. The catheter construct was subsequently withdrawn. Flat panel CT of the head was obtained and post processed in a separate workstation with concurrent attending physician supervision. Selected images were sent to PACS.  Stable appearance of known is left sided subarachnoid hemorrhage with no evidence of intra procedural bleed. A right common femoral artery angiogram with right anterior oblique view was obtained. Mild atherosclerotic changes of the right common femoral artery are noted, without stenosis. The access is at the level of the common femoral artery which has appropriate caliber for closure device utilization. The 8 French sheath was removed over the wire and a Perclose ProGlide closure device was used for access closure. Immediate hemostasis was achieved. 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 PLAN: 1. Patient transferred to ICU for anesthesia recovery and vasospasm watch. 2. Brilinta can be discontinued while remaining on aspirin 81 mg. Electronically Signed   By: Pedro Earls M.D.   On: 08/07/2019 13:43   IR US Guide Vasc Access Right  Result Date: 08/07/2019 INDICATION: 64 year old male presenting to the emergency with 1 day history of intermittent dysphasia and right hand numbness and weakness. Head CT showed a left sylvian subarachnoid hemorrhage (mFS 1). 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 He had been started on dual anti-platelet therapy with aspirin and Brilinta 5 days ago in anticipation to schedule procedure on 08/07/2019. At admission, he presented mild expressive aphasia with no motor deficit. He was then taken to our service for emergency angiogram and left MCA aneurysm treatment. EXAM: Ultrasound-guided vascular access Diagnostic cerebral angiogram 3D rotational angiogram Balloon assisted coil embolization of brain aneurysm  Flat panel CT COMPARISON:  Diagnostic cerebral angiogram July 19, 2019 MEDICATIONS: No antibiotics utilized during the procedure. A total of 5000 units of heparin IV administered. ANESTHESIA/SEDATION: The procedure was performed under general anesthesia. CONTRAST:  140 cc of Omnipaque 240 FLUOROSCOPY TIME:  Fluoroscopy Time: 72 minutes 18 seconds (3059 mGy). COMPLICATIONS: None immediate. TECHNIQUE: Informed written consent was obtained from the patient after a thorough discussion of the procedural risks, benefits and alternatives. All questions were addressed. Maximal Sterile Barrier Technique was utilized including caps, mask, sterile gowns, sterile gloves, sterile drape, hand hygiene and skin antiseptic. A timeout was performed prior to the initiation of the procedure. Real-time ultrasound guidance was utilized for vascular access including the acquisition of a permanent ultrasound image documenting patency of the accessed vessel. Using a micropuncture kit and the modified Seldinger  technique, access was gained to the right common femoral artery and an 8 French sheath was placed. Then, a 5 [place] DAV catheter was navigated over a 0.035 inch Terumo Glidewire into the aortic arch under fluoroscopy. The catheter was then placed into the left common carotid artery. Frontal and lateral road map was obtained. The catheter was then navigated into the left internal artery. Frontal and lateral angiograms of the head were. 3D rotational angiograms were acquired and post processed in a separate workstation under concurrent attending physician supervision. Selected images were sent to PACS. Magnified frontal and lateral angiograms of the head in working projections were obtained. 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 PROCEDURE: The DAV catheter was exchanged over the wire and under biplane roadmap for a cerabase guide catheter which was placed in the  cervical left ICA. A 6 [place] intermediate catheter was then navigated over the [employer] into the petrous segment of the left ICA. Magnified frontal and lateral angiograms of the head were obtained in the working projections. Using biplane roadmap, a headway duo microcatheter was navigated over a synchro 2 micro guidewire into the left MCA bifurcation aneurysm pouch. Attempted primary coiling proved unsuccessful due to wide neck. A 3 x 15 mm Transform compliant balloon was navigated over a synchro 14 micro guidewire in a parallel fashion through the [place] intermediate catheter into the left MCA M2 inferior division branch with the balloon at the level of the aneurysm neck. Then, a 4 mm x 6 cm target 360 soft framing coil was placed within the aneurysm pouch while the transform balloon was inflated. The balloon was then deflated. The coil mass remained stable within the aneurysm pouch. Control angiogram showed maintained patency of the daughter vessels. The coil was then detached. In a similar fashion, 3 filling and 2 finishing coils were placed within the aneurysm pouch (3 mm x 6 cm target 360 ultra, 3 mm x 4 cm target 360 ultra, 2.5 mm x 4 cm target 360 Nano, 1.5 mm by 4 cm target 360 nano and 1.5 mm x 4 cm target 360 nano). Control angiograms were obtained. The microcatheter was then removed followed by removal of the transform balloon. Follow-up magnified frontal and lateral angiograms of the head in the working projections were obtained showing adequate aneurysm occlusion with good coil packing density and preservation of the parent artery and daughter arteries. Delayed magnified frontal and lateral angiograms of the head in the working projections were obtained showing stable coil mass and no evidence of clot formation on the coil mass surface. Frontal and lateral angiograms with views of the entire head were obtained. No evidence of thromboembolic complication noted. The catheter construct was  subsequently withdrawn. Flat panel CT of the head was obtained and post processed in a separate workstation with concurrent attending physician supervision. Selected images were sent to PACS. Stable appearance of known is left sided subarachnoid hemorrhage with no evidence of intra procedural bleed. A right common femoral artery angiogram with right anterior oblique view was obtained. Mild atherosclerotic changes of the right common femoral artery are noted, without stenosis. The access is at the level of the common femoral artery which has appropriate caliber for closure device utilization. The 8 French sheath was removed over the wire and a Perclose ProGlide closure device was used for access closure. Immediate hemostasis was achieved. IMPRESSION: Successful and uncomplicated balloon assisted coiling of a ruptured 4.5 mm left  MCA bifurcation wide neck aneurysm. PLAN: 1. Patient transferred to ICU for anesthesia recovery and vasospasm watch. 2. Brilinta can be discontinued while remaining on aspirin 81 mg. Electronically Signed   By: Pedro Earls M.D.   On: 08/07/2019 13:43   Chest Port 1 View  Result Date: 08/06/2019 CLINICAL DATA:  64 year old male code stroke presentation with multiple intracranial aneurysms on MRA/conventional cerebral angiogram and presenting with aneurysmal type subarachnoid hemorrhage today. EXAM: PORTABLE CHEST 1 VIEW COMPARISON:  None. FINDINGS: Portable AP upright view at 1320 hours. Lung volumes and mediastinal contours are within normal limits. Visualized tracheal air column is within normal limits. Allowing for portable technique the lungs are clear. No pneumothorax. No acute osseous abnormality identified. IMPRESSION: No acute cardiopulmonary abnormality. Electronically Signed   By: Genevie Ann M.D.   On: 08/06/2019 13:28   EEG adult  Result Date: 08/07/2019 [name], MD     08/07/2019  2:03 PM Patient Name: Victor Pena MRN: [id number] Epilepsy Attending:  [name] Referring Physician/Provider: Dr. Rosalin Hawking Date: 08/07/2019 Duration: 24.20 minutes Patient history: 64 year old male with left frontoparietal aneurysmal subarachnoid hemorrhage status post coiling.  EEG evaluate for seizures. Level of alertness: Awake, drowsy AEDs during EEG study: None Technical aspects: This EEG study was done with scalp electrodes positioned according to the 10-20 International system of electrode placement. Electrical activity was acquired at a sampling rate of 500Hz  and reviewed with a high frequency filter of 70Hz  and a low frequency filter of 1Hz . EEG data were recorded continuously and digitally stored. Description: The posterior dominant rhythm consists of 9 Hz activity of moderate voltage (25-35 uV) seen predominantly in posterior head regions, symmetric and reactive to eye opening and eye closing.  Drowsiness was characterized by attenuation of the posterior background rhythm and roving eye movements. Hyperventilation and photic stimulation were not performed. IMPRESSION: This study is within normal limits. No seizures or epileptiform discharges were seen throughout the recording. [name]   ECHOCARDIOGRAM COMPLETE  Result Date: 08/07/2019    ECHOCARDIOGRAM REPORT   Patient Name:   Tabias A Gomm Date of Exam: 08/07/2019 Medical Rec #:  [id number]      Height:       71.0 in Accession #:    [id number]     Weight:       190.0 lb Date of Birth:  1955-04-15      BSA:          2.063 m Patient Age:    64 years       BP:           109/65 mmHg Patient Gender: M              HR:           79 bpm. Exam Location:  Inpatient Procedure: 2D Echo Indications:    Stroke I163.9  History:        Patient has no prior history of Echocardiogram examinations.  Sonographer:    Mikki Santee RDCS (AE) Referring Phys: [id number] [place]  1. Left ventricular ejection fraction, by estimation, is 60 to 65%. The left ventricle has normal function. The left ventricle has no  regional wall motion abnormalities. Left ventricular diastolic parameters are consistent with Grade II diastolic dysfunction (pseudonormalization).  2. Right ventricular systolic function is normal. The right ventricular size is normal.  3. The mitral valve is normal in structure. No evidence of mitral valve regurgitation. No evidence of  mitral stenosis.  4. The aortic valve was not well visualized. Aortic valve regurgitation is not visualized. No aortic stenosis is present.  5. Aortic dilatation noted. 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  6. The inferior vena cava is normal in size with greater than 50% respiratory variability, suggesting right atrial pressure of 3 mmHg. Conclusion(s)/Recommendation(s): No intracardiac source of embolism detected on this transthoracic study. A transesophageal echocardiogram is recommended to exclude cardiac source of embolism if clinically indicated. FINDINGS  Left Ventricle: Left ventricular ejection fraction, by estimation, is 60 to 65%. The left ventricle has normal function. The left ventricle has no regional wall motion abnormalities. The left ventricular internal cavity size was normal in size. There is  no left ventricular hypertrophy. Left ventricular diastolic parameters are consistent with Grade II diastolic dysfunction (pseudonormalization). Right Ventricle: The right ventricular size is normal. No increase in right ventricular wall thickness. Right ventricular systolic function is normal. Left Atrium: Left atrial size was normal in size. Right Atrium: Right atrial size was normal in size. Pericardium: There is no evidence of pericardial effusion. Mitral Valve: The mitral valve is normal in structure. Normal mobility of the mitral valve leaflets. No evidence of mitral valve regurgitation. No evidence of mitral valve stenosis. Tricuspid Valve: The tricuspid valve is normal in structure. Tricuspid valve regurgitation is not demonstrated. No  evidence of tricuspid stenosis. Aortic Valve: The aortic valve was not well visualized. Aortic valve regurgitation is not visualized. No aortic stenosis is present. Pulmonic Valve: The pulmonic valve was normal in structure. Pulmonic valve regurgitation is not visualized. No evidence of pulmonic stenosis. Aorta: Aortic dilatation noted. 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 Venous: The inferior vena cava is normal in size with greater than 50% respiratory variability, suggesting right atrial pressure of 3 mmHg. IAS/Shunts: No atrial level shunt detected by color flow Doppler.  LEFT VENTRICLE PLAX 2D LVIDd:         4.50 cm  Diastology LVIDs:         3.00 cm  LV e' lateral:   9.57 cm/s LV PW:         1.00 cm  LV E/e' lateral: 6.7 LV IVS:        1.00 cm  LV e' medial:    9.36 cm/s LVOT diam:     2.70 cm  LV E/e' medial:  6.8 LV SV:         102 LV SV Index:   49 LVOT Area:     5.73 cm  RIGHT VENTRICLE RV S prime:     15.20 cm/s TAPSE (M-mode): 1.7 cm LEFT ATRIUM             Index       RIGHT ATRIUM           Index LA diam:        3.20 cm 1.55 cm/m  RA Area:     14.30 cm LA Vol (A2C):   53.6 ml 25.98 ml/m RA Volume:   34.90 ml  16.92 ml/m LA Vol (A4C):   43.6 ml 21.13 ml/m LA Biplane Vol: 49.6 ml 24.04 ml/m  AORTIC VALVE LVOT Vmax:   87.50 cm/s LVOT Vmean:  58.100 cm/s LVOT VTI:    0.178 m  AORTA Ao Root diam: 3.80 cm MITRAL VALVE MV Area (PHT): 2.52 cm    SHUNTS MV Decel Time: 301 msec    Systemic VTI:  0.18 m MV E  velocity: 63.80 cm/s  Systemic Diam: 2.70 cm MV A velocity: 57.00 cm/s MV E/A ratio:  1.12 Candee Furbish MD Electronically signed by Candee Furbish MD Signature Date/Time: 08/07/2019/11:13:11 AM    Final    CT HEAD CODE STROKE WO CONTRAST  Addendum Date: 08/06/2019   ADDENDUM REPORT: 08/06/2019 12:51 ADDENDUM: These results were called by telephone at the time of interpretation on 08/06/2019 at 12:51 pm to provider Dr. Cheral Marker, who verbally acknowledged these results.  Electronically Signed   By: Kellie Simmering DO   On: 08/06/2019 12:51   Result Date: 08/06/2019 CLINICAL DATA:  Code stroke. Neuro deficit, acute, stroke suspected. Difficulty speaking. EXAM: CT HEAD WITHOUT CONTRAST TECHNIQUE: Contiguous axial images were obtained from the base of the skull through the vertex without intravenous contrast. COMPARISON:  MRI/MRA head 07/10/2019. FINDINGS: Brain: There is moderate volume acute subareolar hemorrhage greatest along the left frontal lobe convexity and within the left sylvian fissure. No acute demarcated cortical infarct is identified. No extra-axial fluid collection. No evidence of intracranial mass. No midline shift. Vascular: No hyperdense vessel. Skull: Normal. Negative for fracture or focal lesion. Sinuses/Orbits: Visualized orbits show no acute finding. Mild ethmoid sinus mucosal thickening. No significant mastoid effusion. IMPRESSION: Moderate volume acute subarachnoid hemorrhage greatest along the left frontal lobe convexity and within the left sylvian fissure. 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 No hydrocephalus. No acute demarcated cortical infarct. Electronically Signed: By: Kellie Simmering DO On: 08/06/2019 12:39   VAS [place] TRANSCRANIAL DOPPLER  Result Date: 08/08/2019  Transcranial Doppler Indications: Subarachnoid hemorrhage. Performing Technologist: June Leap RDMS, RVT  Examination Guidelines: A complete evaluation includes B-mode imaging, spectral Doppler, color Doppler, and power Doppler as needed of all accessible portions of each vessel. Bilateral testing is considered an integral part of a complete examination. Limited examinations for reoccurring indications may be performed as noted.  +----------+-------------+----------+-----------+-------+ RIGHT TCD Right VM (cm)Depth (cm)PulsatilityComment  +----------+-------------+----------+-----------+-------+ MCA           88.00                 1.24            +----------+-------------+----------+-----------+-------+ ACA          -61.00                 1.02            +----------+-------------+----------+-----------+-------+ Term ICA      47.00                 0.89            +----------+-------------+----------+-----------+-------+ PCA           37.00                 1.15            +----------+-------------+----------+-----------+-------+ Opthalmic     24.00                 2.04            +----------+-------------+----------+-----------+-------+ ICA siphon    31.00                 1.76            +----------+-------------+----------+-----------+-------+ Vertebral    -20.00                 1.14            +----------+-------------+----------+-----------+-------+  +----------+------------+----------+-----------+-------+  LEFT TCD  Left VM (cm)Depth (cm)PulsatilityComment +----------+------------+----------+-----------+-------+ MCA          74.00                 1.22            +----------+------------+----------+-----------+-------+ ACA          -31.00                 1.2            +----------+------------+----------+-----------+-------+ Term ICA     36.00                 1.59            +----------+------------+----------+-----------+-------+ PCA          54.00                 1.08            +----------+------------+----------+-----------+-------+ Opthalmic    25.00                 2.39            +----------+------------+----------+-----------+-------+ ICA siphon   36.00                 0.93            +----------+------------+----------+-----------+-------+ Vertebral    -25.00                1.25            +----------+------------+----------+-----------+-------+  +------------+-------+-------+             VM cm/sComment +------------+-------+-------+ Prox  Basilar-33.00         +------------+-------+-------+ Dist Basilar-29.00         +------------+-------+-------+ Summary:  Mildly elevated bilateral middle cerebral artery mean flow velocities of unclear significance.No definite vasospasm noted. *See table(s) above for TCD measurements and observations.  Diagnosing physician: Antony Contras MD Electronically signed by Antony Contras MD on 08/08/2019 at 8:16:03 AM.    Final    IR NEURO EACH ADD'L AFTER BASIC UNI LEFT (MS)  Result Date: 08/07/2019 INDICATION: 64 year old male presenting to the emergency with 1 day history of intermittent dysphasia and right hand numbness and weakness. Head CT showed a left sylvian subarachnoid hemorrhage (mFS 1). 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 He had been started on dual anti-platelet therapy with aspirin and Brilinta 5 days ago in anticipation to schedule procedure on 08/07/2019. At admission, he presented mild expressive aphasia with no motor deficit. He was then taken to our service for emergency angiogram and left MCA aneurysm treatment. EXAM: Ultrasound-guided vascular access Diagnostic cerebral angiogram 3D rotational angiogram Balloon assisted coil embolization of brain aneurysm Flat panel CT COMPARISON:  Diagnostic cerebral angiogram July 19, 2019 MEDICATIONS: No antibiotics utilized during the procedure. A total of 5000 units of heparin IV administered. ANESTHESIA/SEDATION: The procedure was performed under general anesthesia. CONTRAST:  140 cc of Omnipaque 240 FLUOROSCOPY TIME:  Fluoroscopy Time: 72 minutes 18 seconds (3059 mGy). COMPLICATIONS: None immediate. TECHNIQUE: Informed written consent was obtained from the patient after a thorough  discussion of the procedural risks, benefits and alternatives. All questions were addressed. Maximal Sterile Barrier Technique was  utilized including caps, mask, sterile gowns, sterile gloves, sterile drape, hand hygiene and skin antiseptic. A timeout was performed prior to the initiation of the procedure. Real-time ultrasound guidance was utilized for vascular access including the acquisition of a permanent ultrasound image documenting patency of the accessed vessel. Using a micropuncture kit and the modified Seldinger technique, access was gained to the right common femoral artery and an 8 French sheath was placed. Then, a 5 [place] DAV catheter was navigated over a 0.035 inch Terumo Glidewire into the aortic arch under fluoroscopy. The catheter was then placed into the left common carotid artery. Frontal and lateral road map was obtained. The catheter was then navigated into the left internal artery. Frontal and lateral angiograms of the head were. 3D rotational angiograms were acquired and post processed in a separate workstation under concurrent attending physician supervision. Selected images were sent to PACS. Magnified frontal and lateral angiograms of the head in working projections were obtained. 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 PROCEDURE: The DAV catheter was exchanged over the wire and under biplane roadmap for a cerabase guide catheter which was placed in the cervical left ICA. A 6 [place] intermediate catheter was then navigated over the [employer] into the petrous segment of the left ICA. Magnified frontal and lateral angiograms of the head were obtained in the working projections. Using biplane roadmap, a headway duo microcatheter was navigated over a synchro 2 micro guidewire into the left MCA bifurcation aneurysm pouch. Attempted primary coiling proved unsuccessful due to wide neck. A 3 x 15 mm Transform compliant  balloon was navigated over a synchro 14 micro guidewire in a parallel fashion through the [place] intermediate catheter into the left MCA M2 inferior division branch with the balloon at the level of the aneurysm neck. Then, a 4 mm x 6 cm target 360 soft framing coil was placed within the aneurysm pouch while the transform balloon was inflated. The balloon was then deflated. The coil mass remained stable within the aneurysm pouch. Control angiogram showed maintained patency of the daughter vessels. The coil was then detached. In a similar fashion, 3 filling and 2 finishing coils were placed within the aneurysm pouch (3 mm x 6 cm target 360 ultra, 3 mm x 4 cm target 360 ultra, 2.5 mm x 4 cm target 360 Nano, 1.5 mm by 4 cm target 360 nano and 1.5 mm x 4 cm target 360 nano). Control angiograms were obtained. The microcatheter was then removed followed by removal of the transform balloon. Follow-up magnified frontal and lateral angiograms of the head in the working projections were obtained showing adequate aneurysm occlusion with good coil packing density and preservation of the parent artery and daughter arteries. Delayed magnified frontal and lateral angiograms of the head in the working projections were obtained showing stable coil mass and no evidence of clot formation on the coil mass surface. Frontal and lateral angiograms with views of the entire head were obtained. No evidence of thromboembolic complication noted. The catheter construct was subsequently withdrawn. Flat panel CT of the head was obtained and post processed in a separate workstation with concurrent attending physician supervision. Selected images were sent to PACS. Stable appearance of known is left sided subarachnoid hemorrhage with no evidence of intra procedural bleed. A right common femoral artery angiogram with right anterior oblique view was obtained. Mild atherosclerotic changes of the right common femoral artery are noted, without stenosis.  The access is at the level of the common femoral artery which has appropriate caliber for closure device utilization. The 8 French sheath was removed over the wire and a Perclose ProGlide closure device was used for access closure. Immediate hemostasis was achieved. 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 PLAN: 1. Patient transferred to ICU for anesthesia recovery and vasospasm watch. 2. Brilinta can be discontinued while remaining on aspirin 81 mg. Electronically Signed   By: Pedro Earls M.D.   On: 08/07/2019 13:43    Labs:  CBC: Recent Labs    07/19/19 0909 07/19/19 0909 08/06/19 1217 08/06/19 1226 08/07/19 0631 08/08/19 0219  WBC 6.6  --  7.7  --  9.9 7.7  HGB 14.9   < > 13.9 14.6 12.2* 11.9*  HCT 44.0   < > 41.4 43.0 36.0* 35.6*  PLT 257  --  264  --  239 246   < > = values in this interval not displayed.    COAGS: Recent Labs    07/15/19 1344 08/06/19 1217 08/06/19 1244  INR 1.0 1.0 1.0  APTT 29 27 28     BMP: Recent Labs    07/19/19 0909 07/19/19 0909 08/06/19 1217 08/06/19 1226 08/07/19 0631 08/08/19 0219  NA 142   < > 136 138 136 139  K 4.4   < > 3.9 3.8 3.9 4.2  CL 104   < > 102 103 104 107  CO2 22  --  21*  --  17* 25  GLUCOSE 97   < > 113* 106* 114* 105*  BUN 14   < > 15 17 15 15   CALCIUM 9.7  --  9.6  --  8.1* 8.6*  CREATININE 0.91   < > 1.01 0.80 0.80 0.86  GFRNONAA >60  --  >60  --  >60 >60  GFRAA >60  --  >60  --  >60 >60   < > = values in this interval not displayed.    LIVER FUNCTION TESTS: Recent Labs    08/06/19 1217  BILITOT 0.9  AST 49*  ALT 46*  ALKPHOS 60  PROT 7.4  ALBUMIN 4.0    Assessment and Plan: 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 Karenann Cai. Remains stable in ICU.  Expressive  aphasia continues but is improving daily.  Occasional headaches as expected post hemorrhage.  Right groin incision stable.  Dressing can be removed.  TCDs performed yesterday showed: Mildly elevated bilateral middle cerebral artery mean flow velocities of  unclear significance. No definite vasospasm noted.   EEG also negative.  Continue taking Aspirin 81 mg once daily. Continue Nimodipine 60 mg q 4 hrs for prophylaxis.   Dr. Erlinda Hong present during assessment. Plan made to continue monitoring in ICU and repeat TCD tomorrow.    Electronically Signed: Docia Barrier, PA 08/08/2019, 11:37 AM   I spent a total of 25 Minutes at the the patient's bedside AND on the patient's hospital floor or unit, greater than 50% of which was counseling/coordinating care for ruptured left MCA bifurcation aneurysm s/p coiling.

## 2019-08-08 NOTE — Progress Notes (Signed)
STROKE TEAM PROGRESS NOTE   INTERVAL HISTORY Wife and Dr. Sherlon Handing are at bedside.  Patient sitting in chair, awake alert, still has expressive aphasia word finding difficulty, slightly better than yesterday.  BP stable.  No acute event overnight.  Agree with nicotine patch today.  Smoking cessation again provided.  No symptoms or signs of alcohol withdrawal.  Vitals:   08/08/19 0700 08/08/19 0726 08/08/19 0800 08/08/19 0900  BP: 121/75 126/78 128/90 (!) 115/55  Pulse:  66    Resp: 16 19 18  (!) 9  Temp:  98.8 F (37.1 C)    TempSrc:  Oral    SpO2: 98% 97% 96% 97%  Weight:      Height:        CBC:  Recent Labs  Lab 08/06/19 1217 08/06/19 1226 08/07/19 0631 08/08/19 0219  WBC 7.7   < > 9.9 7.7  NEUTROABS 5.7  --   --   --   HGB 13.9   < > 12.2* 11.9*  HCT 41.4   < > 36.0* 35.6*  MCV 106.4*   < > 105.6* 106.6*  PLT 264   < > 239 246   < > = values in this interval not displayed.    Basic Metabolic Panel:  Recent Labs  Lab 08/07/19 0631 08/08/19 0219  NA 136 139  K 3.9 4.2  CL 104 107  CO2 17* 25  GLUCOSE 114* 105*  BUN 15 15  CREATININE 0.80 0.86  CALCIUM 8.1* 8.6*  MG  --  1.8  PHOS  --  2.3*   Lipid Panel:     Component Value Date/Time   CHOL 218 (H) 08/06/2019 1244   TRIG 106 08/07/2019 1422   HDL 44 08/06/2019 1244   CHOLHDL 5.0 08/06/2019 1244   VLDL 25 08/06/2019 1244   LDLCALC 149 (H) 08/06/2019 1244   HgbA1c:  Lab Results  Component Value Date   HGBA1C 5.2 08/07/2019   Urine Drug Screen: No results found for: LABOPIA, COCAINSCRNUR, LABBENZ, AMPHETMU, THCU, LABBARB  Alcohol Level No results found for: [facility]  IMAGING past 24 hours EEG adult  Result Date: 08/07/2019 10/07/2019, MD     08/07/2019  2:03 PM Patient Name: JABAREE MERCADO MRN: [name] Attending: [id number] Referring Physician/Provider: Dr. Charlsie Quest Date: 08/07/2019 Duration: 24.20 minutes Patient history: 64 year old male with left frontoparietal aneurysmal  subarachnoid hemorrhage status post coiling.  EEG evaluate for seizures. Level of alertness: Awake, drowsy AEDs during EEG study: None Technical aspects: This EEG study was done with scalp electrodes positioned according to the 10-20 International system of electrode placement. Electrical activity was acquired at a sampling rate of 500Hz  and reviewed with a high frequency filter of 70Hz  and a low frequency filter of 1Hz . EEG data were recorded continuously and digitally stored. Description: The posterior dominant rhythm consists of 9 Hz activity of moderate voltage (25-35 uV) seen predominantly in posterior head regions, symmetric and reactive to eye opening and eye closing.  Drowsiness was characterized by attenuation of the posterior background rhythm and roving eye movements. Hyperventilation and photic stimulation were not performed. IMPRESSION: This study is within normal limits. No seizures or epileptiform discharges were seen throughout the recording. [name]   VAS 77 TRANSCRANIAL DOPPLER  Result Date: 08/08/2019  Transcranial Doppler Indications: Subarachnoid hemorrhage. Performing Technologist: RDMS, RVT  Examination Guidelines: A complete evaluation includes B-mode imaging, spectral Doppler, color Doppler, and power Doppler as needed of all accessible portions  of each vessel. Bilateral testing is considered an integral part of a complete examination. Limited examinations for reoccurring indications may be performed as noted.  +----------+-------------+----------+-----------+-------+ RIGHT TCD Right VM (cm)Depth (cm)PulsatilityComment +----------+-------------+----------+-----------+-------+ MCA           88.00                 1.24            +----------+-------------+----------+-----------+-------+ ACA          -61.00                 1.02            +----------+-------------+----------+-----------+-------+ Term ICA      47.00                 0.89             +----------+-------------+----------+-----------+-------+ PCA           37.00                 1.15            +----------+-------------+----------+-----------+-------+ Opthalmic     24.00                 2.04            +----------+-------------+----------+-----------+-------+ ICA siphon    31.00                 1.76            +----------+-------------+----------+-----------+-------+ Vertebral    -20.00                 1.14            +----------+-------------+----------+-----------+-------+  +----------+------------+----------+-----------+-------+ LEFT TCD  Left VM (cm)Depth (cm)PulsatilityComment +----------+------------+----------+-----------+-------+ MCA          74.00                 1.22            +----------+------------+----------+-----------+-------+ ACA          -31.00                 1.2            +----------+------------+----------+-----------+-------+ Term ICA     36.00                 1.59            +----------+------------+----------+-----------+-------+ PCA          54.00                 1.08            +----------+------------+----------+-----------+-------+ Opthalmic    25.00                 2.39            +----------+------------+----------+-----------+-------+ ICA siphon   36.00                 0.93            +----------+------------+----------+-----------+-------+ Vertebral    -25.00                1.25            +----------+------------+----------+-----------+-------+  +------------+-------+-------+             VM cm/sComment +------------+-------+-------+ Prox Basilar-33.00         +------------+-------+-------+ Dist Basilar-29.00         +------------+-------+-------+  Summary:  Mildly elevated bilateral middle cerebral artery mean flow velocities of unclear significance.No definite vasospasm noted. *See table(s) above for TCD measurements and observations.  Diagnosing physician: Delia Heady MD  Electronically signed by Delia Heady MD on 08/08/2019 at 8:16:03 AM.    Final     PHYSICAL EXAM  Temp:  [98.1 F (36.7 C)-99.4 F (37.4 C)] 98.8 F (37.1 C) (05/06 0726) Pulse Rate:  [56-96] 66 (05/06 0726) Resp:  [9-30] 9 (05/06 0900) BP: (91-138)/(48-93) 115/55 (05/06 0900) SpO2:  [91 %-100 %] 97 % (05/06 0900) Arterial Line BP: (120-145)/(44-64) 134/44 (05/05 1500)  General - Well nourished, well developed, in no apparent distress, mildly frustrated about not able to get words out.  Ophthalmologic - fundi not visualized due to noncooperation.  Cardiovascular - Regular rhythm and rate.  Mental Status -  Level of arousal and orientation to time, place, and person were intact. Language exam showed mild to moderate expressive aphasia, hesitation and word finding difficulty, able to name, but skipping words on sentence repetition. Comprehension intact. Attention span and concentration were normal. Fund of Knowledge was assessed and was intact.  Cranial Nerves II - XII - II - Visual field intact OU. III, IV, VI - Extraocular movements intact. V - Facial sensation intact bilaterally. VII - right mild facial droop. VIII - Hearing & vestibular intact bilaterally. X - Palate elevates symmetrically. XI - Chin turning & shoulder shrug intact bilaterally. XII - Tongue protrusion intact.  Motor Strength - The patient's strength was normal in all extremities and pronator drift was absent except right hand grip 4+/5.  Bulk was normal and fasciculations were absent.   Motor Tone - Muscle tone was assessed at the neck and appendages and was normal.  Reflexes - The patient's reflexes were symmetrical in all extremities and he had no pathological reflexes.  Sensory - Light touch, temperature/pinprick were assessed and were symmetrical.    Coordination - The patient had normal movements in the hands with no ataxia or dysmetria.  Tremor was absent.  Gait and Station -  deferred.   ASSESSMENT/PLAN Mr. CHRISTYAN REGER is a 64 y.o. 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 He was taking aspirin and brilinta as of 4/28 and scheduled for an endovascular procedure to treat his RIGHT sided aneurysm 5/5, however, on 5/4 he presented with intermittent dysphagia, R hand numbness and L occipital HA. CT showed L frontoparietal SAH. He was taken to IR for aneurysm securement.   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   Cerebral angio 4/16 - blister like aneurysm right ICA paraophthalmic segment 5.50mm. and left MCA bifurcation saccular aneurysm wide neck 4.28mm  Cerebral angio 5/4 - 4.53mm wide neck L MCA bifurcation aneurysm s/p balloon assisted coiling.   MRI Scattered SAH L sylvian fissure and L frontoparietal convexity stable. Probable few punctate L frontal and parietal lobe infarcts, other DWI abnormality d/t SAH. L MCA coil.  2D Echo EF 60-65%. No source of embolus   TCD MWF 5/5 no vasospasm   EEG no sz  LDL 149  HgbA1c 5.2   On nimodipine 60 mg q4h x 21 days  SCDs for VTE prophylaxis  aspirin 81 mg daily and Brilinta 90 daily prior to admission, on aspirin 81 mg daily. Stopped Brilinta.   Therapy recommendations:  No PT, OP PT  Disposition:  Plan return home  Blood Pressure  Home meds:  None, no hx HTN  Off Cleviprex  Labetalol PRN . SBP goal < 160  . Long-term BP goal normotensive  Hyperlipidemia  Home meds:  No statin  LDL 149  On lipitor 40  Continue on discharge  Alcohol use  ETOH use, alcohol intake elevated lately  Advised to drink no more than 2 drink(s) a day.   Placed on CIWA protocol  (prn ativan)  FA/B1/MVI  Seizure precaution  Tobacco abuse  Current smoker  Smoking cessation counseling provided  Nicotine patch provided    Pt is willing to quit  Other Stroke Risk Factors  Family hx aneurysm (father)  Other [facility] day # 2  This patient is critically ill due to [facility] due to aneurysm rupture, emergent coiling, heavy alcohol, HTN and at significant risk of neurological worsening, death form aneurysm re-rupture, seizure, hydrocephalus, DT, heart failure. This patient's care requires constant monitoring of vital signs, hemodynamics, respiratory and cardiac monitoring, review of multiple databases, neurological assessment, discussion with family, other specialists and medical decision making of high complexity. I spent 35 minutes of neurocritical care time in the care of this patient. I had long discussion with pt and wife at bedside, updated pt current condition, treatment plan and potential prognosis, and answered all the questions. They expressed understanding and appreciation. I also discussed with Dr. Norma Fredrickson.   Rosalin Hawking, MD PhD Stroke Neurology 08/08/2019 10:41 AM   To contact Stroke Continuity provider, please refer to [email or web address] After hours, contact General Neurology

## 2019-08-08 NOTE — Progress Notes (Signed)
Physical Therapy Treatment and Discharge Patient Details Name: DEMARKIS GHEEN MRN: [id number] DOB: Oct 25, 1955 Today's Date: 08/08/2019    History of Present Illness 64 y.o. male with a history of intracranial aneurysms and diplopia.  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  He underwent a diagnostic cerebral angiogram on 07/19/2019 that showed a 4.4 x 4.2 mm wide neck left MCA bifurcation aneurysm and a blisterlike right ICA bifurcation aneurysm measuring 5.6 x 3 0.1 mm with superimposed pseudolobation. Pt presented to ED with intermittent dysphasia, HA, and difficulty utilizing R hand. CT revealed SAH. Pt now s/p coiling.    PT Comments    Pt tolerates treatment well, negotiating 2 flights of stairs and performing multiple dynamic gait tasks without LOB, independently. Pt continues to endorse fine motor and proprioceptive deficits in RUE, however this does not significantly affect his functional mobility currently. Pt reports feeling close to his baseline for ambulation and mobility. PT encourages the patient to ambulate out of the room with staff or family assistance at least 5 times per day to maintain his current level of function. Pt has met all goals and pt is in agreement with acute PT signing off at this time.    Follow Up Recommendations  No PT follow up     Equipment Recommendations  None recommended by PT    Recommendations for Other Services       Precautions / Restrictions Precautions Precautions: None Restrictions Weight Bearing Restrictions: No    Mobility  Bed Mobility                  Transfers Overall transfer level: Independent Equipment used: None Transfers: Sit to/from Stand Sit to Stand: Independent            Ambulation/Gait Ambulation/Gait assistance: Independent Gait Distance (Feet): 300 Feet Assistive device: None Gait Pattern/deviations: Step-through pattern Gait velocity: functional Gait velocity  interpretation: >2.62 ft/sec, indicative of community ambulatory General Gait Details: pt able to change gait speed, walk backwards, side step, step over objects, and lunge without LOB   Stairs Stairs: Yes Stairs assistance: Modified independent (Device/Increase time) Stair Management: One rail Left;Forwards Number of Stairs: 24     Wheelchair Mobility    Modified Rankin (Stroke Patients Only) Modified Rankin (Stroke Patients Only) Pre-Morbid Rankin Score: No symptoms Modified Rankin: Moderate disability     Balance Overall balance assessment: Independent Sitting-balance support: No upper extremity supported;Feet supported Sitting balance-Leahy Scale: Normal     Standing balance support: No upper extremity supported;During functional activity Standing balance-Leahy Scale: Normal                              Cognition Arousal/Alertness: Awake/alert Behavior During Therapy: WFL for tasks assessed/performed Overall Cognitive Status: Within Functional Limits for tasks assessed                                        Exercises      General Comments General comments (skin integrity, edema, etc.): VSS on RA      Pertinent Vitals/Pain Pain Assessment: No/denies pain    Home Living                      Prior Function            PT Goals (  current goals can now be found in the care plan section) Acute Rehab PT Goals Patient Stated Goal: 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" Progress towards PT goals: Goals met/education completed, patient discharged from PT    Frequency    (D/C PT services)      PT Plan Current plan remains appropriate    Co-evaluation              AM-PAC PT "6 Clicks" Mobility   Outcome Measure  Help needed turning from your back to your side while in a flat bed without using bedrails?: None Help needed moving from lying on your back to sitting on the side of a flat bed  without using bedrails?: None Help needed moving to and from a bed to a chair (including a wheelchair)?: None Help needed standing up from a chair using your arms (e.g., wheelchair or bedside chair)?: None Help needed to walk in hospital room?: None Help needed climbing 3-5 steps with a railing? : None 6 Click Score: 24    End of Session   Activity Tolerance: Patient tolerated treatment well Patient left: in chair;with call bell/phone within reach Nurse Communication: Mobility status       Time: 9892-1194 PT Time Calculation (min) (ACUTE ONLY): 14 min  Charges:  $Gait Training: 8-22 mins                     Zenaida Niece, PT, DPT Acute Rehabilitation Pager: [place] 08/08/2019, 12:42 PM

## 2019-08-08 NOTE — Progress Notes (Signed)
Occupational Therapy Treatment Patient Details Name: Victor Pena MRN: [id number] DOB: 07/10/1955 Today's Date: 08/08/2019    History of present illness 64 y.o. male with a history of intracranial aneurysms and diplopia.  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  He underwent a diagnostic cerebral angiogram on 07/19/2019 that showed a 4.4 x 4.2 mm wide neck left MCA bifurcation aneurysm and a blisterlike right ICA bifurcation aneurysm measuring 5.6 x 3 0.1 mm with superimposed pseudolobation. Pt presented to ED with intermittent dysphasia, HA, and difficulty utilizing R hand. CT revealed SAH. Pt now s/p coiling.   OT comments  Pt progressing towards established OT goals and demonstrating high motivation to participate in therapy. Pt performing FM exercises including AROM, pincer grasp, and in hand manipulation. Continues to present with decreased FM coordination and proprioception. Continue to recommend dc to home with follow up at OP OT. Will continue to follow acutely as admitted.    Follow Up Recommendations  Outpatient OT    Equipment Recommendations  None recommended by OT    Recommendations for Other Services Speech consult    Precautions / Restrictions Precautions Precautions: None Restrictions Weight Bearing Restrictions: No       Mobility Bed Mobility               General bed mobility comments: Up in recliner upon arrival  Transfers Overall transfer level: Independent Equipment used: None Transfers: Sit to/from Stand Sit to Stand: Independent              Balance Overall balance assessment: Independent Sitting-balance support: No upper extremity supported;Feet supported Sitting balance-Leahy Scale: Normal     Standing balance support: No upper extremity supported;During functional activity Standing balance-Leahy Scale: Normal                             ADL either performed or assessed with clinical  judgement   ADL Overall ADL's : Needs assistance/impaired     Grooming: Supervision/safety;Sitting(Apply lotion)                                 General ADL Comments: Focused session on R FM exercises. '     Vision       Perception     Praxis      Cognition Arousal/Alertness: Awake/alert Behavior During Therapy: WFL for tasks assessed/performed Overall Cognitive Status: Within Functional Limits for tasks assessed                                          Exercises Exercises: Other exercises Other Exercises Other Exercises: FM exercises. Pt performing ROM exercises including composite flex/ext, opposition, thumb circles, etc.  Other Exercises: Pt picking up paper clips and placing them into cup with lid (into straw slit).  Other Exercises: Challenging in hand manipulation and translation to pick up three paper clips and then translate them to palm; then translate back to finger tips and place in cup.    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" OT Goal Formulation: With patient Time For Goal Achievement: 08/21/19 Potential to Achieve Goals: Good ADL Goals Pt Will Perform Eating: with modified independence;with adaptive utensils;sitting Pt Will Perform Grooming: with modified independence;sitting Pt/caregiver will Perform Home Exercise Program: (RUE fine motor HEP mod I using form)  Plan Discharge plan remains appropriate    Co-evaluation                  AM-PAC OT "6 Clicks" Daily Activity     Outcome Measure   Help from another person eating meals?: A Little Help from another person taking care of personal grooming?: A Little Help from another person toileting, which includes using toliet, bedpan, or urinal?: A Little Help from another person bathing (including washing, rinsing, drying)?: A Little Help from another person to put on and taking off regular upper body clothing?: A Little Help from another person to put on and taking off regular lower body clothing?: A Little 6 Click Score: 18    End of Session    OT Visit Diagnosis: Unsteadiness on feet (R26.81);Muscle weakness (generalized) (M62.81)   Activity Tolerance Patient tolerated treatment well   Patient Left with call bell/phone within reach;in chair   Nurse Communication Mobility status;Precautions        Time: 2440-1027 OT Time Calculation (min): 21 min  Charges: OT General Charges $OT Visit: 1 Visit OT Treatments $Therapeutic Exercise: 8-22 mins  Victor Pena MSOT, OTR/L Acute Rehab Pager: [phone] Office: [phone]   Theodoro Grist Ronnetta Currington 08/08/2019, 1:01 PM

## 2019-08-08 NOTE — Evaluation (Addendum)
Speech Language Pathology Evaluation Patient Details Name: Victor Pena MRN: [id number] DOB: Jun 15, 1955 Today's Date: 08/08/2019 Time: 6295-2841 SLP Time Calculation (min) (ACUTE ONLY): 32 min  Problem List:  Patient Active Problem List   Diagnosis Date Noted  . Subarachnoid hemorrhage ([place]) 08/06/2019  . Bilateral impacted cerumen 10/09/2015  . Conductive hearing loss in left ear 10/09/2015  . Smokes 1 pack of cigarettes per day 10/09/2015   Past Medical History:  Past Medical History:  Diagnosis Date  . Diplopia   . Intracranial aneurysm    Past Surgical History:  Past Surgical History:  Procedure Laterality Date  . BACK SURGERY    . IR 3D INDEPENDENT WKST  07/19/2019  . IR 3D INDEPENDENT WKST  07/19/2019  . IR 3D INDEPENDENT WKST  08/06/2019  . IR ANGIO EXTERNAL CAROTID SEL EXT CAROTID BILAT MOD SED  07/19/2019  . IR ANGIO INTRA EXTRACRAN SEL INTERNAL CAROTID BILAT MOD SED  07/19/2019  . IR ANGIO VERTEBRAL SEL VERTEBRAL BILAT MOD SED  07/19/2019  . IR ANGIOGRAM FOLLOW UP STUDY  08/06/2019  . IR ANGIOGRAM FOLLOW UP STUDY  08/06/2019  . IR ANGIOGRAM FOLLOW UP STUDY  08/06/2019  . IR ANGIOGRAM FOLLOW UP STUDY  08/06/2019  . IR ANGIOGRAM FOLLOW UP STUDY  08/06/2019  . IR ANGIOGRAM FOLLOW UP STUDY  08/06/2019  . IR CT HEAD LTD  08/06/2019  . IR NEURO EACH ADD'L AFTER BASIC UNI LEFT (MS)  08/06/2019  . IR TRANSCATH/EMBOLIZ  08/06/2019  . IR US GUIDE VASC ACCESS RIGHT  07/19/2019  . IR US GUIDE VASC ACCESS RIGHT  08/06/2019  . RADIOLOGY WITH ANESTHESIA N/A 08/06/2019   Procedure: IR WITH ANESTHESIA;  Surgeon: Radiologist, Medication, MD;  Location: [facility];  Service: Radiology;  Laterality: N/A;   HPI:  64 y.o. male with a history of intracranial aneurysms and diplopia.  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  He underwent a diagnostic cerebral angiogram on 07/19/2019 that showed a 4.4 x 4.2 mm wide neck left MCA bifurcation aneurysm and a blisterlike right ICA  bifurcation aneurysm measuring 5.6 x 3 0.1 mm with superimposed pseudolobation. Pt presented to ED with intermittent dysphasia, HA, and difficulty utilizing R hand. CT revealed SAH. Pt now s/p coiling.   Assessment / Plan / Recommendation Clinical Impression  Pt exhibits moderate verbal apraxia. His ability to comprehend complex commands and conversational dialoque is intact. Pt's verbal expression is relatively within normal limits as he can name objects and eventually express most thoughts given additional time and repeated attempts. Expression in phrases and sentences is not telegraphic and he includes articles of speech. His motor output is marked by dysfluency, false starts, inconsistent, phonemic errors, groping for articulatory placement and at times, syllabic repetition. [name] aware of majority of errors and has started to compensate. He self regulated by halting and restrarting with success most of the time. SLP educated pt on additional techniques to promote verbal accuracy. He was able to write his name and generate a short sentence. ST will continue to see while here and recommend outpatient ST for treatment to increase communicative independence.        SLP Assessment  SLP Recommendation/Assessment: Patient needs continued Speech Lanaguage Pathology Services SLP Visit Diagnosis: Apraxia (R48.2)    Follow Up Recommendations  Outpatient SLP    Frequency and Duration min 3x week  2 weeks      SLP Evaluation Cognition  Overall Cognitive Status: Within  Functional Limits for tasks assessed Arousal/Alertness: Awake/alert Orientation Level: Oriented X4 Attention: Sustained Sustained Attention: Appears intact Memory: Appears intact Awareness: Appears intact Problem Solving: Appears intact Behaviors: Lability Safety/Judgment: Appears intact       Comprehension  Auditory Comprehension Overall Auditory Comprehension: Appears within functional limits for tasks assessed Commands:  Within Functional Limits Interfering Components: Motor planning Visual Recognition/Discrimination Discrimination: Not tested Reading Comprehension Reading Status: (TBA)    Expression Expression Primary Mode of Expression: Verbal Verbal Expression Overall Verbal Expression: Impaired Initiation: Impaired Level of Generative/Spontaneous Verbalization: Phrase;Sentence Repetition: Impaired Level of Impairment: Word level Naming: No impairment Pragmatics: No impairment Written Expression Dominant Hand: Right Written Expression: (will assess further)   Oral / Motor  Oral Motor/Sensory Function Overall Oral Motor/Sensory Function: Within functional limits Motor Speech Overall Motor Speech: Impaired Respiration: Within functional limits Phonation: Normal Resonance: Within functional limits Articulation: Impaired Level of Impairment: Phrase Intelligibility: Intelligibility reduced Word: 75-100% accurate Phrase: 50-74% accurate Motor Planning: Impaired Level of Impairment: Phrase Motor Speech Errors: Aware;Inconsistent   GO                    Royce Macadamia 08/08/2019, 8:37 AM  Breck Coons Lonell Face.Ed Nurse, children's [phone] Office [phone]

## 2019-08-09 ENCOUNTER — Inpatient Hospital Stay (HOSPITAL_COMMUNITY): Payer: Managed Care, Other (non HMO)

## 2019-08-09 DIAGNOSIS — I671 Cerebral aneurysm, nonruptured: Secondary | ICD-10-CM

## 2019-08-09 LAB — CBC
HCT: 40 % (ref 39.0–52.0)
Hemoglobin: 13.2 g/dL (ref 13.0–17.0)
MCH: 35 pg — ABNORMAL HIGH (ref 26.0–34.0)
MCHC: 33 g/dL (ref 30.0–36.0)
MCV: 106.1 fL — ABNORMAL HIGH (ref 80.0–100.0)
Platelets: 269 10*3/uL (ref 150–400)
RBC: 3.77 MIL/uL — ABNORMAL LOW (ref 4.22–5.81)
RDW: 13 % (ref 11.5–15.5)
WBC: 7.4 10*3/uL (ref 4.0–10.5)
nRBC: 0 % (ref 0.0–0.2)

## 2019-08-09 LAB — BASIC METABOLIC PANEL
Anion gap: 10 (ref 5–15)
BUN: 15 mg/dL (ref 8–23)
CO2: 22 mmol/L (ref 22–32)
Calcium: 9 mg/dL (ref 8.9–10.3)
Chloride: 106 mmol/L (ref 98–111)
Creatinine, Ser: 0.77 mg/dL (ref 0.61–1.24)
GFR calc Af Amer: >60 mL/min (ref >60)
GFR calc non Af Amer: >60 mL/min (ref >60)
Glucose, Bld: 104 mg/dL — ABNORMAL HIGH (ref 70–99)
Potassium: 3.7 mmol/L (ref 3.5–5.1)
Sodium: 138 mmol/L (ref 135–145)

## 2019-08-09 NOTE — TOC Initial Note (Signed)
Transition of Care [facility]) - Initial/Assessment Note    Patient Details  Name: Victor Pena MRN: [id number] Date of Birth: 1955/09/27  Transition of Care [facility]) CM/SW Contact:    Ella Bodo, RN Phone Number: 08/09/2019, 2:11 PM  Clinical Narrative:  64 y.o. male with a history of intracranial aneurysms and diplopia.  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  He underwent a diagnostic cerebral angiogram on 07/19/2019 that showed a 4.4 x 4.2 mm wide neck left MCA bifurcation aneurysm and a blisterlike right ICA bifurcation aneurysm measuring 5.6 x 3 0.1 mm with superimposed pseudolobation. Pt presented to ED with intermittent dysphasia, HA, and difficulty utilizing R hand. CT revealed SAH. Pt now s/p coiling. PTA, pt independent and living with spouse, who can provide 24h assistance at dc.  PT recommending no OP f/u; OT recommending OP f/u.  ST pending.  Will follow for additional recommendations.   Met with pt/wife, as wife has insurance concerns; assured pt/wife that pt is being followed by Utilization Review Dept, and being monitored for medical necessity.  Explained UR process to pt/wife.  She verbalized understanding and states she feels "a lot better" since she spoke to me.                  Expected Discharge Plan: OP Rehab Barriers to Discharge: Continued Medical Work up   Patient Goals and CMS Choice        Expected Discharge Plan and Services Expected Discharge Plan: OP Rehab   Discharge Planning Services: CM Consult   Living arrangements for the past 2 months: Single Family Home                                      Prior Living Arrangements/Services Living arrangements for the past 2 months: Single Family Home Lives with:: Spouse Patient language and need for interpreter reviewed:: Yes Do you feel safe going back to the place where you live?: Yes      Need for Family Participation in Patient Care: Yes (Comment) Care giver  support system in place?: Yes (comment)   Criminal Activity/Legal Involvement Pertinent to Current Situation/Hospitalization: No - Comment as needed  Activities of Daily Living Home Assistive Devices/Equipment: None ADL Screening (condition at time of admission) Patient's cognitive ability adequate to safely complete daily activities?: Yes Is the patient deaf or have difficulty hearing?: No Does the patient have difficulty seeing, even when wearing glasses/contacts?: No Does the patient have difficulty concentrating, remembering, or making decisions?: No Patient able to express need for assistance with ADLs?: Yes Does the patient have difficulty dressing or bathing?: No Independently performs ADLs?: Yes (appropriate for developmental age) Does the patient have difficulty walking or climbing stairs?: No Weakness of Legs: Right Weakness of Arms/Hands: Right  Permission Sought/Granted                  Emotional Assessment Appearance:: Appears stated age Attitude/Demeanor/Rapport: Engaged Affect (typically observed): Accepting Orientation: : Oriented to Self, Oriented to Place, Oriented to  Time, Oriented to Situation      Admission diagnosis:  Subarachnoid hemorrhage [facility]) [I60.9] Patient Active Problem List   Diagnosis Date Noted  . Subarachnoid hemorrhage ([place]) 08/06/2019  . Bilateral impacted cerumen 10/09/2015  . Conductive hearing loss in left ear 10/09/2015  . Smokes 1 pack of cigarettes per day 10/09/2015   PCP:  Patient, No Pcp Per Pharmacy:   [id number]: [phone]: [phone]    Social Determinants of Health (SDOH) Interventions    Readmission Risk Interventions No flowsheet data found.  JReinaldo Raddle RN, BSN  Trauma/Neuro ICU Case Manager [phone]

## 2019-08-09 NOTE — Progress Notes (Signed)
STROKE TEAM PROGRESS NOTE   INTERVAL HISTORY Wife, daughter, and vascular technologist are at bedside.  TCD today showed no vasospasm.  Patient speech has some improvement, more fluent than yesterday, but still has significant hesitation of speech.  Right hand grip also improved from yesterday.  Vitals:   08/09/19 0500 08/09/19 0600 08/09/19 0700 08/09/19 0800  BP:    (!) 126/92  Pulse: 60   68  Resp: 16 18 15    Temp:    98.2 F (36.8 C)  TempSrc:    Oral  SpO2: 98% 99% 96%   Weight:      Height:        CBC:  Recent Labs  Lab 08/06/19 1217 08/06/19 1226 08/08/19 0219 08/09/19 0916  WBC 7.7   < > 7.7 7.4  NEUTROABS 5.7  --   --   --   HGB 13.9   < > 11.9* 13.2  HCT 41.4   < > 35.6* 40.0  MCV 106.4*   < > 106.6* 106.1*  PLT 264   < > 246 269   < > = values in this interval not displayed.    Basic Metabolic Panel:  Recent Labs  Lab 08/08/19 0219 08/09/19 0916  NA 139 138  K 4.2 3.7  CL 107 106  CO2 25 22  GLUCOSE 105* 104*  BUN 15 15  CREATININE 0.86 0.77  CALCIUM 8.6* 9.0  MG 1.8  --   PHOS 2.3*  --    Lipid Panel:     Component Value Date/Time   CHOL 218 (H) 08/06/2019 1244   TRIG 106 08/07/2019 1422   HDL 44 08/06/2019 1244   CHOLHDL 5.0 08/06/2019 1244   VLDL 25 08/06/2019 1244   LDLCALC 149 (H) 08/06/2019 1244   HgbA1c:  Lab Results  Component Value Date   HGBA1C 5.2 08/07/2019    IMAGING past 24 hours No results found.  PHYSICAL EXAM  Temp:  [98 F (36.7 C)-98.8 F (37.1 C)] 98.2 F (36.8 C) (05/07 0800) Pulse Rate:  [59-74] 68 (05/07 0800) Resp:  [13-25] 15 (05/07 0700) BP: (111-147)/(63-97) 126/92 (05/07 0800) SpO2:  [96 %-100 %] 96 % (05/07 0700)  General - Well nourished, well developed, in no apparent distress.  Ophthalmologic - fundi not visualized due to noncooperation.  Cardiovascular - Regular rhythm and rate.  Mental Status -  Level of arousal and orientation to time, place, and person were intact. Language exam  showed mild expressive aphasia, still significant hesitation, able to name and slow repetition. Comprehension intact. Attention span and concentration were normal. Fund of Knowledge was assessed and was intact.  Cranial Nerves II - XII - II - Visual field intact OU. III, IV, VI - Extraocular movements intact. V - Facial sensation intact bilaterally. VII - mild right mild facial droop. VIII - Hearing & vestibular intact bilaterally. X - Palate elevates symmetrically. XI - Chin turning & shoulder shrug intact bilaterally. XII - Tongue protrusion intact.  Motor Strength - The patient's strength was normal in all extremities and pronator drift was absent except right hand grip 5-/5.  Bulk was normal and fasciculations were absent.   Motor Tone - Muscle tone was assessed at the neck and appendages and was normal.  Reflexes - The patient's reflexes were symmetrical in all extremities and he had no pathological reflexes.  Sensory - Light touch, temperature/pinprick were assessed and were symmetrical.    Coordination - The patient had normal movements in the hands with no  ataxia or dysmetria.  Tremor was absent.  Gait and Station - deferred.   ASSESSMENT/PLAN Mr. DEVONDRE GUZZETTA is a 64 y.o. 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 He was taking aspirin and brilinta as of 4/28 and scheduled for an endovascular procedure to treat his RIGHT sided aneurysm 5/5, however, on 5/4 he presented with intermittent dysphagia, R hand numbness and L occipital HA. CT showed L frontoparietal SAH. He was taken to IR for aneurysm securement.   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   Cerebral angio 4/16 - blister like aneurysm right ICA paraophthalmic segment 5.55mm. and left MCA bifurcation saccular aneurysm wide neck  4.66mm  Cerebral angio 5/4 - 4.22mm wide neck L MCA bifurcation aneurysm s/p balloon assisted coiling.   MRI Scattered SAH L sylvian fissure and L frontoparietal convexity stable. Probable few punctate L frontal and parietal lobe infarcts, other DWI abnormality d/t SAH. L MCA coil.  2D Echo EF 60-65%. No source of embolus   TCD MWF 5/5 no vasospasm   EEG no sz  LDL 149  HgbA1c 5.2   On nimodipine 60 mg q4h x 21 days  SCDs for VTE prophylaxis  aspirin 81 mg daily and Brilinta 90 daily prior to admission, on aspirin 81 mg daily. Stopped Brilinta.   Therapy recommendations:  No PT, OP PT  Disposition:  Plan return home  Blood Pressure  Home meds:  None, no hx HTN  Off Cleviprex  Labetalol PRN . SBP goal < 160  . Long-term BP goal normotensive  Hyperlipidemia  Home meds:  No statin  LDL 149  On lipitor 40  Continue on discharge  Alcohol use  ETOH use, alcohol intake elevated lately  Advised to drink no more than 2 drink(s) a day.   Placed on CIWA protocol  (prn ativan)  FA/B1/MVI  Seizure precaution  Tobacco abuse  Current smoker  Smoking cessation counseling provided  Nicotine patch provided   Pt is willing to quit  Other Stroke Risk Factors  Family hx aneurysm (father)  Other Active Problems    Hospital day # 3  This patient is critically ill due to [facility] due to aneurysm rupture, emergent coiling, heavy alcohol, HTN and at significant risk of neurological worsening, death form aneurysm re-rupture, seizure, hydrocephalus, DT, heart failure. This patient's care requires constant monitoring of vital signs, hemodynamics, respiratory and cardiac monitoring, review of multiple databases, neurological assessment, discussion with family, other specialists and medical decision making of high complexity. I spent 35 minutes of neurocritical care time in the care of this patient. I had long discussion with daughter and wife at bedside, updated pt current  condition, treatment plan and potential prognosis, and answered all the questions. They expressed understanding and appreciation. I also discussed with Dr. Sherlon Handing.   Marvel Plan, MD PhD Stroke Neurology 08/09/2019 10:42 AM   To contact Stroke Continuity provider, please refer to [email or web address]. After hours, contact General Neurology

## 2019-08-09 NOTE — Progress Notes (Addendum)
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 Karenann Cai. Patient awake and alert sitting in bed. Accompanied by wife and daughter at bedside. Still with expressive aphasia, continues to improve. Mild right facial droop- slightly improved since yesterday.   Allergies: Other  Medications: Prior to Admission medications   Medication Sig Start Date End Date Taking? Authorizing Provider  acetaminophen (TYLENOL) 500 MG tablet Take 1,000 mg by mouth every 6 (six) hours as needed for mild pain or headache.   Yes [provider]  aspirin 81 MG EC tablet Take 81 mg by mouth daily. Swallow whole.   Yes [provider]  ibuprofen (ADVIL) 200 MG tablet Take 400 mg by mouth every 6 (six) hours as needed for moderate pain.   Yes [provider]  ticagrelor (BRILINTA) 90 MG TABS tablet Take 90 mg by mouth daily.    Yes [provider]  diazepam (VALIUM) 5 MG tablet Take 1 tablet 30 mins prior to MRI. May take second dose if needed. Patient not taking: Reported on 07/17/2019 07/02/19   [name], MD     Vital Signs: BP (!) 126/92 (BP Location: Right Arm)   Pulse 68   Temp 98.2 F (36.8 C) (Oral)   Resp 15   Ht 5' 11"  (1.803 m)   Wt 190 lb (86.2 kg)   SpO2 96%   BMI 26.50 kg/m   Physical Exam Vitals and nursing note reviewed.  Constitutional:      General: [name] not in acute distress.    Appearance: Normal appearance.  Pulmonary:     Effort: Pulmonary effort is normal. No respiratory distress.  Skin:    General: Skin is warm and dry.  Neurological:     Mental Status: [name] alert.     Comments: Alert, awake, and oriented x3. Follows simple  commands. Demonstrates expressive aphasia- continues to improve daily. Mild right facial droop. Can spontaneously move all extremities. No pronator drift.     Imaging: MR BRAIN WO CONTRAST  Result Date: 08/07/2019 CLINICAL DATA:  Initial evaluation for worsened speech, status post coil embolization of MCA aneurysm. EXAM: MRI HEAD WITHOUT CONTRAST TECHNIQUE: Multiplanar, multiecho pulse sequences of the brain and surrounding structures were obtained without intravenous contrast. COMPARISON:  Prior head CT from 08/06/2019. FINDINGS: Brain: Mild age-related cerebral atrophy. Scattered susceptibility artifact and T1 hyperintensity seen throughout the left sylvian fissure and overlying left frontoparietal sulci, consistent with subarachnoid hemorrhage, similar to previous CT. Scattered serpiginous diffusion signal abnormality seen throughout this region, favored in large part to be related to the underlying subarachnoid blood, although superimposed small volume ischemic changes could be present as well, difficult to exclude. There are a few scattered punctate foci of diffusion abnormality involving the cortical gray matter of the parasagittal left frontal lobe (series 5, image 93) and left parietal lobe (series 5, images 86, 87, likely reflecting punctate ischemic infarcts. No frank intraparenchymal hematoma or mass effect. No other diffusion abnormality to suggest acute or subacute ischemia. Gray-white matter differentiation otherwise maintained. No other areas of remote cortical infarction. No other evidence for acute or chronic intracranial hemorrhage. No mass lesion or midline shift. No hydrocephalus or extra-axial collection. Midline structures intact. Pituitary gland suprasellar region normal. Vascular: Susceptibility artifact  related to coiled left MCA bifurcation aneurysm. Major intracranial vascular flow voids maintained elsewhere. Skull and upper cervical spine: Craniocervical junction within normal  limits. Upper cervical spine normal. Bone marrow signal intensity within normal limits. No scalp soft tissue abnormality. Sinuses/Orbits: Globes and orbital soft tissues within normal limits. Mild scattered mucosal thickening noted within the ethmoidal air cells. Paranasal sinuses are otherwise largely clear. Trace right mastoid effusion. Inner ear structures grossly normal. Other: None. IMPRESSION: 1. Scattered subarachnoid hemorrhage involving the left sylvian fissure and overlying left frontal parietal convexity, relatively stable from prior CT, and consistent with recently ruptured left MCA bifurcation aneurysm. 2. Few probable punctate ischemic nonhemorrhagic infarcts involving the left frontal and parietal lobes as above. Additional scattered serpiginous diffusion abnormality favored to in large part be related to the underlying subarachnoid hemorrhage. 3. Sequelae of interval coil embolization of left MCA bifurcation aneurysm. Electronically Signed   By: Jeannine Boga M.D.   On: 08/07/2019 02:21   IR Transcath/Emboliz  Result Date: 08/07/2019 INDICATION: 64 year old male presenting to the emergency with 1 day history of intermittent dysphasia and right hand numbness and weakness. Head CT showed a left sylvian subarachnoid hemorrhage (mFS 1). 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 He had been started on dual anti-platelet therapy with aspirin and Brilinta 5 days ago in anticipation to schedule procedure on 08/07/2019. At admission, he presented mild expressive aphasia with no motor deficit. He was then taken to our service for emergency angiogram and left MCA aneurysm treatment. EXAM: Ultrasound-guided vascular access Diagnostic  cerebral angiogram 3D rotational angiogram Balloon assisted coil embolization of brain aneurysm Flat panel CT COMPARISON:  Diagnostic cerebral angiogram July 19, 2019 MEDICATIONS: No antibiotics utilized during the procedure. A total of 5000 units of heparin IV administered. ANESTHESIA/SEDATION: The procedure was performed under general anesthesia. CONTRAST:  140 cc of Omnipaque 240 FLUOROSCOPY TIME:  Fluoroscopy Time: 72 minutes 18 seconds (3059 mGy). COMPLICATIONS: None immediate. TECHNIQUE: Informed written consent was obtained from the patient after a thorough discussion of the procedural risks, benefits and alternatives. All questions were addressed. Maximal Sterile Barrier Technique was utilized including caps, mask, sterile gowns, sterile gloves, sterile drape, hand hygiene and skin antiseptic. A timeout was performed prior to the initiation of the procedure. Real-time ultrasound guidance was utilized for vascular access including the acquisition of a permanent ultrasound image documenting patency of the accessed vessel. Using a micropuncture kit and the modified Seldinger technique, access was gained to the right common femoral artery and an 8 French sheath was placed. Then, a 5 [place] DAV catheter was navigated over a 0.035 inch Terumo Glidewire into the aortic arch under fluoroscopy. The catheter was then placed into the left common carotid artery. Frontal and lateral road map was obtained. The catheter was then navigated into the left internal artery. Frontal and lateral angiograms of the head were. 3D rotational angiograms were acquired and post processed in a separate workstation under concurrent attending physician supervision. Selected images were sent to PACS. Magnified frontal and lateral angiograms of the head in working projections were obtained. 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 PROCEDURE: The DAV catheter was exchanged over  the wire and  under biplane roadmap for a cerabase guide catheter which was placed in the cervical left ICA. A 6 [place] intermediate catheter was then navigated over the [employer] into the petrous segment of the left ICA. Magnified frontal and lateral angiograms of the head were obtained in the working projections. Using biplane roadmap, a headway duo microcatheter was navigated over a synchro 2 micro guidewire into the left MCA bifurcation aneurysm pouch. Attempted primary coiling proved unsuccessful due to wide neck. A 3 x 15 mm Transform compliant balloon was navigated over a synchro 14 micro guidewire in a parallel fashion through the [place] intermediate catheter into the left MCA M2 inferior division branch with the balloon at the level of the aneurysm neck. Then, a 4 mm x 6 cm target 360 soft framing coil was placed within the aneurysm pouch while the transform balloon was inflated. The balloon was then deflated. The coil mass remained stable within the aneurysm pouch. Control angiogram showed maintained patency of the daughter vessels. The coil was then detached. In a similar fashion, 3 filling and 2 finishing coils were placed within the aneurysm pouch (3 mm x 6 cm target 360 ultra, 3 mm x 4 cm target 360 ultra, 2.5 mm x 4 cm target 360 Nano, 1.5 mm by 4 cm target 360 nano and 1.5 mm x 4 cm target 360 nano). Control angiograms were obtained. The microcatheter was then removed followed by removal of the transform balloon. Follow-up magnified frontal and lateral angiograms of the head in the working projections were obtained showing adequate aneurysm occlusion with good coil packing density and preservation of the parent artery and daughter arteries. Delayed magnified frontal and lateral angiograms of the head in the working projections were obtained showing stable coil mass and no evidence of clot formation on the coil mass surface. Frontal and lateral angiograms with views of the entire head were  obtained. No evidence of thromboembolic complication noted. The catheter construct was subsequently withdrawn. Flat panel CT of the head was obtained and post processed in a separate workstation with concurrent attending physician supervision. Selected images were sent to PACS. Stable appearance of known is left sided subarachnoid hemorrhage with no evidence of intra procedural bleed. A right common femoral artery angiogram with right anterior oblique view was obtained. Mild atherosclerotic changes of the right common femoral artery are noted, without stenosis. The access is at the level of the common femoral artery which has appropriate caliber for closure device utilization. The 8 French sheath was removed over the wire and a Perclose ProGlide closure device was used for access closure. Immediate hemostasis was achieved. 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 PLAN: 1. Patient transferred to ICU for anesthesia recovery and vasospasm watch. 2. Brilinta can be discontinued while remaining on aspirin 81 mg. Electronically Signed   By: Pedro Earls M.D.   On: 08/07/2019 13:43   IR Angiogram Follow Up Study  Result Date: 08/07/2019 INDICATION: 64 year old male presenting to the emergency with 1 day history of intermittent dysphasia and right hand numbness and weakness. Head CT showed a left sylvian subarachnoid hemorrhage (mFS 1). 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 He  had been started on dual anti-platelet therapy with aspirin and Brilinta 5 days ago in anticipation to schedule procedure on 08/07/2019. At admission, he presented mild  expressive aphasia with no motor deficit. He was then taken to our service for emergency angiogram and left MCA aneurysm treatment. EXAM: Ultrasound-guided vascular access Diagnostic cerebral angiogram 3D rotational angiogram Balloon assisted coil embolization of brain aneurysm Flat panel CT COMPARISON:  Diagnostic cerebral angiogram July 19, 2019 MEDICATIONS: No antibiotics utilized during the procedure. A total of 5000 units of heparin IV administered. ANESTHESIA/SEDATION: The procedure was performed under general anesthesia. CONTRAST:  140 cc of Omnipaque 240 FLUOROSCOPY TIME:  Fluoroscopy Time: 72 minutes 18 seconds (3059 mGy). COMPLICATIONS: None immediate. TECHNIQUE: Informed written consent was obtained from the patient after a thorough discussion of the procedural risks, benefits and alternatives. All questions were addressed. Maximal Sterile Barrier Technique was utilized including caps, mask, sterile gowns, sterile gloves, sterile drape, hand hygiene and skin antiseptic. A timeout was performed prior to the initiation of the procedure. Real-time ultrasound guidance was utilized for vascular access including the acquisition of a permanent ultrasound image documenting patency of the accessed vessel. Using a micropuncture kit and the modified Seldinger technique, access was gained to the right common femoral artery and an 8 French sheath was placed. Then, a 5 [place] DAV catheter was navigated over a 0.035 inch Terumo Glidewire into the aortic arch under fluoroscopy. The catheter was then placed into the left common carotid artery. Frontal and lateral road map was obtained. The catheter was then navigated into the left internal artery. Frontal and lateral angiograms of the head were. 3D rotational angiograms were acquired and post processed in a separate workstation under concurrent attending physician supervision. Selected images were sent to PACS. Magnified frontal and lateral angiograms of the head in  working projections were obtained. 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 PROCEDURE: The DAV catheter was exchanged over the wire and under biplane roadmap for a cerabase guide catheter which was placed in the cervical left ICA. A 6 [place] intermediate catheter was then navigated over the [employer] into the petrous segment of the left ICA. Magnified frontal and lateral angiograms of the head were obtained in the working projections. Using biplane roadmap, a headway duo microcatheter was navigated over a synchro 2 micro guidewire into the left MCA bifurcation aneurysm pouch. Attempted primary coiling proved unsuccessful due to wide neck. A 3 x 15 mm Transform compliant balloon was navigated over a synchro 14 micro guidewire in a parallel fashion through the [place] intermediate catheter into the left MCA M2 inferior division branch with the balloon at the level of the aneurysm neck. Then, a 4 mm x 6 cm target 360 soft framing coil was placed within the aneurysm pouch while the transform balloon was inflated. The balloon was then deflated. The coil mass remained stable within the aneurysm pouch. Control angiogram showed maintained patency of the daughter vessels. The coil was then detached. In a similar fashion, 3 filling and 2 finishing coils were placed within the aneurysm pouch (3 mm x 6 cm target 360 ultra, 3 mm x 4 cm target 360 ultra, 2.5 mm x 4 cm target 360 Nano, 1.5 mm by 4 cm target 360 nano and 1.5 mm x 4 cm target 360 nano). Control angiograms were obtained. The microcatheter was then removed followed by removal of the transform balloon. Follow-up magnified frontal and lateral angiograms  of the head in the working projections were obtained showing adequate aneurysm occlusion with good coil packing density and preservation of the parent artery and daughter arteries. Delayed magnified frontal and lateral angiograms of the head in  the working projections were obtained showing stable coil mass and no evidence of clot formation on the coil mass surface. Frontal and lateral angiograms with views of the entire head were obtained. No evidence of thromboembolic complication noted. The catheter construct was subsequently withdrawn. Flat panel CT of the head was obtained and post processed in a separate workstation with concurrent attending physician supervision. Selected images were sent to PACS. Stable appearance of known is left sided subarachnoid hemorrhage with no evidence of intra procedural bleed. A right common femoral artery angiogram with right anterior oblique view was obtained. Mild atherosclerotic changes of the right common femoral artery are noted, without stenosis. The access is at the level of the common femoral artery which has appropriate caliber for closure device utilization. The 8 French sheath was removed over the wire and a Perclose ProGlide closure device was used for access closure. Immediate hemostasis was achieved. 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 PLAN: 1. Patient transferred to ICU for anesthesia recovery and vasospasm watch. 2. Brilinta can be discontinued while remaining on aspirin 81 mg. Electronically Signed   By: Pedro Earls M.D.   On: 08/07/2019 13:43   IR Angiogram Follow Up Study  Result Date: 08/07/2019 INDICATION: 64 year old male presenting to the emergency with 1 day history of intermittent dysphasia and right hand numbness and weakness. Head CT showed a left sylvian subarachnoid hemorrhage (mFS 1). 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 The bleeding pattern was consistent with rupture of his known left MCA  bifurcation aneurysm. He had been started on dual anti-platelet therapy with aspirin and Brilinta 5 days ago in anticipation to schedule procedure on 08/07/2019. At admission, he presented mild expressive aphasia with no motor deficit. He was then taken to our service for emergency angiogram and left MCA aneurysm treatment. EXAM: Ultrasound-guided vascular access Diagnostic cerebral angiogram 3D rotational angiogram Balloon assisted coil embolization of brain aneurysm Flat panel CT COMPARISON:  Diagnostic cerebral angiogram July 19, 2019 MEDICATIONS: No antibiotics utilized during the procedure. A total of 5000 units of heparin IV administered. ANESTHESIA/SEDATION: The procedure was performed under general anesthesia. CONTRAST:  140 cc of Omnipaque 240 FLUOROSCOPY TIME:  Fluoroscopy Time: 72 minutes 18 seconds (3059 mGy). COMPLICATIONS: None immediate. TECHNIQUE: Informed written consent was obtained from the patient after a thorough discussion of the procedural risks, benefits and alternatives. All questions were addressed. Maximal Sterile Barrier Technique was utilized including caps, mask, sterile gowns, sterile gloves, sterile drape, hand hygiene and skin antiseptic. A timeout was performed prior to the initiation of the procedure. Real-time ultrasound guidance was utilized for vascular access including the acquisition of a permanent ultrasound image documenting patency of the accessed vessel. Using a micropuncture kit and the modified Seldinger technique, access was gained to the right common femoral artery and an 8 French sheath was placed. Then, a 5 [place] DAV catheter was navigated over a 0.035 inch Terumo Glidewire into the aortic arch under fluoroscopy. The catheter was then placed into the left common carotid artery. Frontal and  lateral road map was obtained. The catheter was then navigated into the left internal artery. Frontal and lateral angiograms of the head were. 3D rotational angiograms were  acquired and post processed in a separate workstation under concurrent attending physician supervision. Selected images were sent to PACS. Magnified frontal and lateral angiograms of the head in working projections were obtained. 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 PROCEDURE: The DAV catheter was exchanged over the wire and under biplane roadmap for a cerabase guide catheter which was placed in the cervical left ICA. A 6 [place] intermediate catheter was then navigated over the [employer] into the petrous segment of the left ICA. Magnified frontal and lateral angiograms of the head were obtained in the working projections. Using biplane roadmap, a headway duo microcatheter was navigated over a synchro 2 micro guidewire into the left MCA bifurcation aneurysm pouch. Attempted primary coiling proved unsuccessful due to wide neck. A 3 x 15 mm Transform compliant balloon was navigated over a synchro 14 micro guidewire in a parallel fashion through the [place] intermediate catheter into the left MCA M2 inferior division branch with the balloon at the level of the aneurysm neck. Then, a 4 mm x 6 cm target 360 soft framing coil was placed within the aneurysm pouch while the transform balloon was inflated. The balloon was then deflated. The coil mass remained stable within the aneurysm pouch. Control angiogram showed maintained patency of the daughter vessels. The coil was then detached. In a similar fashion, 3 filling and 2 finishing coils were placed within the aneurysm pouch (3 mm x 6 cm target 360 ultra, 3 mm x 4 cm target 360 ultra, 2.5 mm x 4 cm target 360 Nano, 1.5 mm by 4 cm target 360 nano and 1.5 mm x 4 cm target 360 nano). Control angiograms were obtained. The microcatheter was then removed followed by removal of the transform balloon. Follow-up magnified frontal and lateral angiograms of the head in the working projections were obtained  showing adequate aneurysm occlusion with good coil packing density and preservation of the parent artery and daughter arteries. Delayed magnified frontal and lateral angiograms of the head in the working projections were obtained showing stable coil mass and no evidence of clot formation on the coil mass surface. Frontal and lateral angiograms with views of the entire head were obtained. No evidence of thromboembolic complication noted. The catheter construct was subsequently withdrawn. Flat panel CT of the head was obtained and post processed in a separate workstation with concurrent attending physician supervision. Selected images were sent to PACS. Stable appearance of known is left sided subarachnoid hemorrhage with no evidence of intra procedural bleed. A right common femoral artery angiogram with right anterior oblique view was obtained. Mild atherosclerotic changes of the right common femoral artery are noted, without stenosis. The access is at the level of the common femoral artery which has appropriate caliber for closure device utilization. The 8 French sheath was removed over the wire and a Perclose ProGlide closure device was used for access closure. Immediate hemostasis was achieved. 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 PLAN: 1. Patient transferred to ICU for anesthesia recovery and vasospasm watch. 2. Brilinta can be discontinued while remaining on aspirin 81 mg. Electronically Signed   By: Pedro Earls M.D.   On: 08/07/2019 13:43   IR Angiogram Follow Up  Study  Result Date: 08/07/2019 INDICATION: 64 year old male presenting to the emergency with 1 day history of intermittent dysphasia and right hand numbness and weakness. Head CT showed a left sylvian subarachnoid hemorrhage (mFS 1). 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 He had been started on dual anti-platelet therapy with aspirin and Brilinta 5 days ago in anticipation to schedule procedure on 08/07/2019. At admission, he presented mild expressive aphasia with no motor deficit. He was then taken to our service for emergency angiogram and left MCA aneurysm treatment. EXAM: Ultrasound-guided vascular access Diagnostic cerebral angiogram 3D rotational angiogram Balloon assisted coil embolization of brain aneurysm Flat panel CT COMPARISON:  Diagnostic cerebral angiogram July 19, 2019 MEDICATIONS: No antibiotics utilized during the procedure. A total of 5000 units of heparin IV administered. ANESTHESIA/SEDATION: The procedure was performed under general anesthesia. CONTRAST:  140 cc of Omnipaque 240 FLUOROSCOPY TIME:  Fluoroscopy Time: 72 minutes 18 seconds (3059 mGy). COMPLICATIONS: None immediate. TECHNIQUE: Informed written consent was obtained from the patient after a thorough discussion of the procedural risks, benefits and alternatives. All questions were addressed. Maximal Sterile Barrier Technique was utilized including caps, mask, sterile gowns, sterile gloves, sterile drape, hand hygiene and skin antiseptic. A timeout was performed prior to the initiation of the procedure. Real-time ultrasound guidance was utilized for vascular access including the acquisition of a permanent ultrasound image documenting patency of the accessed vessel. Using a micropuncture kit and the modified Seldinger technique, access was gained to the right common femoral artery and an 8 French sheath was placed. Then, a 5 [place] DAV catheter was navigated over a 0.035 inch Terumo Glidewire into the aortic arch under fluoroscopy. The catheter was then placed into the left common carotid artery.  Frontal and lateral road map was obtained. The catheter was then navigated into the left internal artery. Frontal and lateral angiograms of the head were. 3D rotational angiograms were acquired and post processed in a separate workstation under concurrent attending physician supervision. Selected images were sent to PACS. Magnified frontal and lateral angiograms of the head in working projections were obtained. 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 PROCEDURE: The DAV catheter was exchanged over the wire and under biplane roadmap for a cerabase guide catheter which was placed in the cervical left ICA. A 6 [place] intermediate catheter was then navigated over the [employer] into the petrous segment of the left ICA. Magnified frontal and lateral angiograms of the head were obtained in the working projections. Using biplane roadmap, a headway duo microcatheter was navigated over a synchro 2 micro guidewire into the left MCA bifurcation aneurysm pouch. Attempted primary coiling proved unsuccessful due to wide neck. A 3 x 15 mm Transform compliant balloon was navigated over a synchro 14 micro guidewire in a parallel fashion through the [place] intermediate catheter into the left MCA M2 inferior division branch with the balloon at the level of the aneurysm neck. Then, a 4 mm x 6 cm target 360 soft framing coil was placed within the aneurysm pouch while the transform balloon was inflated. The balloon was then deflated. The coil mass remained stable within the aneurysm pouch. Control angiogram showed maintained patency  of the daughter vessels. The coil was then detached. In a similar fashion, 3 filling and 2 finishing coils were placed within the aneurysm pouch (3 mm x 6 cm target 360 ultra, 3 mm x 4 cm target 360 ultra, 2.5 mm x 4 cm target 360 Nano, 1.5 mm by 4 cm target 360 nano and 1.5 mm x 4 cm target 360 nano). Control angiograms were obtained.  The microcatheter was then removed followed by removal of the transform balloon. Follow-up magnified frontal and lateral angiograms of the head in the working projections were obtained showing adequate aneurysm occlusion with good coil packing density and preservation of the parent artery and daughter arteries. Delayed magnified frontal and lateral angiograms of the head in the working projections were obtained showing stable coil mass and no evidence of clot formation on the coil mass surface. Frontal and lateral angiograms with views of the entire head were obtained. No evidence of thromboembolic complication noted. The catheter construct was subsequently withdrawn. Flat panel CT of the head was obtained and post processed in a separate workstation with concurrent attending physician supervision. Selected images were sent to PACS. Stable appearance of known is left sided subarachnoid hemorrhage with no evidence of intra procedural bleed. A right common femoral artery angiogram with right anterior oblique view was obtained. Mild atherosclerotic changes of the right common femoral artery are noted, without stenosis. The access is at the level of the common femoral artery which has appropriate caliber for closure device utilization. The 8 French sheath was removed over the wire and a Perclose ProGlide closure device was used for access closure. Immediate hemostasis was achieved. 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 PLAN: 1. Patient transferred to ICU for anesthesia recovery and vasospasm watch. 2. Brilinta can be discontinued while remaining on aspirin 81 mg. Electronically Signed   By: Pedro Earls M.D.   On: 08/07/2019 13:43   IR Angiogram Follow Up Study  Result Date: 08/07/2019 INDICATION: 64 year old male presenting to the emergency with 1 day history of intermittent dysphasia and right hand numbness  and weakness. Head CT showed a left sylvian subarachnoid hemorrhage (mFS 1). 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 He had been started on dual anti-platelet therapy with aspirin and Brilinta 5 days ago in anticipation to schedule procedure on 08/07/2019. At admission, he presented mild expressive aphasia with no motor deficit. He was then taken to our service for emergency angiogram and left MCA aneurysm treatment. EXAM: Ultrasound-guided vascular access Diagnostic cerebral angiogram 3D rotational angiogram Balloon assisted coil embolization of brain aneurysm Flat panel CT COMPARISON:  Diagnostic cerebral angiogram July 19, 2019 MEDICATIONS: No antibiotics utilized during the procedure. A total of 5000 units of heparin IV administered. ANESTHESIA/SEDATION: The procedure was performed under general anesthesia. CONTRAST:  140 cc of Omnipaque 240 FLUOROSCOPY TIME:  Fluoroscopy Time: 72 minutes 18 seconds (3059 mGy). COMPLICATIONS: None immediate. TECHNIQUE: Informed written consent was obtained from the patient after a thorough discussion of the procedural risks, benefits and alternatives. All questions were addressed. Maximal Sterile Barrier Technique was utilized including caps, mask, sterile gowns, sterile gloves, sterile drape, hand hygiene and skin antiseptic. A timeout was  performed prior to the initiation of the procedure. Real-time ultrasound guidance was utilized for vascular access including the acquisition of a permanent ultrasound image documenting patency of the accessed vessel. Using a micropuncture kit and the modified Seldinger technique, access was gained to the right common femoral artery and an 8 French sheath was placed.  Then, a 5 [place] DAV catheter was navigated over a 0.035 inch Terumo Glidewire into the aortic arch under fluoroscopy. The catheter was then placed into the left common carotid artery. Frontal and lateral road map was obtained. The catheter was then navigated into the left internal artery. Frontal and lateral angiograms of the head were. 3D rotational angiograms were acquired and post processed in a separate workstation under concurrent attending physician supervision. Selected images were sent to PACS. Magnified frontal and lateral angiograms of the head in working projections were obtained. 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 PROCEDURE: The DAV catheter was exchanged over the wire and under biplane roadmap for a cerabase guide catheter which was placed in the cervical left ICA. A 6 [place] intermediate catheter was then navigated over the [employer] into the petrous segment of the left ICA. Magnified frontal and lateral angiograms of the head were obtained in the working projections. Using biplane roadmap, a headway duo microcatheter was navigated over a synchro 2 micro guidewire into the left MCA bifurcation aneurysm pouch. Attempted primary coiling proved unsuccessful due to wide neck. A 3 x 15 mm Transform compliant balloon was navigated over a synchro 14 micro guidewire in a parallel fashion through the [place] intermediate catheter into the left MCA M2 inferior division branch with the balloon at the level of the aneurysm neck. Then, a 4 mm x 6 cm target 360 soft framing coil was placed within the aneurysm pouch while the transform balloon was inflated. The balloon was then deflated. The coil mass remained stable within the aneurysm pouch. Control angiogram showed maintained patency of the daughter vessels. The coil was then detached. In a similar fashion, 3 filling and 2 finishing coils were placed within the aneurysm pouch (3 mm x  6 cm target 360 ultra, 3 mm x 4 cm target 360 ultra, 2.5 mm x 4 cm target 360 Nano, 1.5 mm by 4 cm target 360 nano and 1.5 mm x 4 cm target 360 nano). Control angiograms were obtained. The microcatheter was then removed followed by removal of the transform balloon. Follow-up magnified frontal and lateral angiograms of the head in the working projections were obtained showing adequate aneurysm occlusion with good coil packing density and preservation of the parent artery and daughter arteries. Delayed magnified frontal and lateral angiograms of the head in the working projections were obtained showing stable coil mass and no evidence of clot formation on the coil mass surface. Frontal and lateral angiograms with views of the entire head were obtained. No evidence of thromboembolic complication noted. The catheter construct was subsequently withdrawn. Flat panel CT of the head was obtained and post processed in a separate workstation with concurrent attending physician supervision. Selected images were sent to PACS. Stable appearance of known is left sided subarachnoid hemorrhage with no evidence of intra procedural bleed. A right common femoral artery angiogram with right anterior oblique view was obtained. Mild atherosclerotic changes of the right common femoral artery are noted, without stenosis. The access is at the level of the common femoral artery which has appropriate caliber for closure device  utilization. The 8 French sheath was removed over the wire and a Perclose ProGlide closure device was used for access closure. Immediate hemostasis was achieved. 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 PLAN: 1. Patient transferred to ICU for anesthesia recovery and vasospasm watch. 2. Brilinta can be discontinued while remaining on aspirin 81 mg. Electronically Signed   By: Pedro Earls M.D.   On: 08/07/2019 13:43    IR Angiogram Follow Up Study  Result Date: 08/07/2019 INDICATION: 65 year old male presenting to the emergency with 1 day history of intermittent dysphasia and right hand numbness and weakness. Head CT showed a left sylvian subarachnoid hemorrhage (mFS 1). 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 He had been started on dual anti-platelet therapy with aspirin and Brilinta 5 days ago in anticipation to schedule procedure on 08/07/2019. At admission, he presented mild expressive aphasia with no motor deficit. He was then taken to our service for emergency angiogram and left MCA aneurysm treatment. EXAM: Ultrasound-guided vascular access Diagnostic cerebral angiogram 3D rotational angiogram Balloon assisted coil embolization of brain aneurysm Flat panel CT COMPARISON:  Diagnostic cerebral angiogram July 19, 2019 MEDICATIONS: No antibiotics utilized during the procedure. A total of 5000 units of heparin IV administered. ANESTHESIA/SEDATION: The procedure was performed under general anesthesia. CONTRAST:  140 cc of Omnipaque 240 FLUOROSCOPY TIME:  Fluoroscopy Time: 72 minutes 18 seconds (3059 mGy). COMPLICATIONS: None immediate. TECHNIQUE: Informed written consent was obtained from the patient after a thorough discussion of the procedural risks, benefits and alternatives. All questions were addressed. Maximal Sterile Barrier Technique was utilized including caps, mask, sterile gowns, sterile gloves, sterile drape, hand hygiene and skin antiseptic. A timeout was performed prior to the initiation of the procedure. Real-time ultrasound guidance was utilized for vascular access including the acquisition of a permanent ultrasound image documenting  patency of the accessed vessel. Using a micropuncture kit and the modified Seldinger technique, access was gained to the right common femoral artery and an 8 French sheath was placed. Then, a 5 [place] DAV catheter was navigated over a 0.035 inch Terumo Glidewire into the aortic arch under fluoroscopy. The catheter was then placed into the left common carotid artery. Frontal and lateral road map was obtained. The catheter was then navigated into the left internal artery. Frontal and lateral angiograms of the head were. 3D rotational angiograms were acquired and post processed in a separate workstation under concurrent attending physician supervision. Selected images were sent to PACS. Magnified frontal and lateral angiograms of the head in working projections were obtained. 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 PROCEDURE: The DAV catheter was exchanged over the wire and under biplane roadmap for a cerabase guide catheter which was placed in the cervical left ICA. A 6 [place] intermediate catheter was then navigated over the [employer] into the petrous segment of the left ICA. Magnified frontal and lateral angiograms of the head were obtained in the working projections. Using biplane roadmap, a headway duo microcatheter was navigated over a synchro 2 micro guidewire into the left MCA bifurcation aneurysm pouch. Attempted primary coiling proved unsuccessful due to  wide neck. A 3 x 15 mm Transform compliant balloon was navigated over a synchro 14 micro guidewire in a parallel fashion through the [place] intermediate catheter into the left MCA M2 inferior division branch with the balloon at the level of the aneurysm neck. Then, a 4 mm x 6 cm target 360 soft framing coil was placed within the aneurysm pouch while the transform balloon was inflated. The balloon was then deflated. The coil mass remained stable within the aneurysm pouch. Control  angiogram showed maintained patency of the daughter vessels. The coil was then detached. In a similar fashion, 3 filling and 2 finishing coils were placed within the aneurysm pouch (3 mm x 6 cm target 360 ultra, 3 mm x 4 cm target 360 ultra, 2.5 mm x 4 cm target 360 Nano, 1.5 mm by 4 cm target 360 nano and 1.5 mm x 4 cm target 360 nano). Control angiograms were obtained. The microcatheter was then removed followed by removal of the transform balloon. Follow-up magnified frontal and lateral angiograms of the head in the working projections were obtained showing adequate aneurysm occlusion with good coil packing density and preservation of the parent artery and daughter arteries. Delayed magnified frontal and lateral angiograms of the head in the working projections were obtained showing stable coil mass and no evidence of clot formation on the coil mass surface. Frontal and lateral angiograms with views of the entire head were obtained. No evidence of thromboembolic complication noted. The catheter construct was subsequently withdrawn. Flat panel CT of the head was obtained and post processed in a separate workstation with concurrent attending physician supervision. Selected images were sent to PACS. Stable appearance of known is left sided subarachnoid hemorrhage with no evidence of intra procedural bleed. A right common femoral artery angiogram with right anterior oblique view was obtained. Mild atherosclerotic changes of the right common femoral artery are noted, without stenosis. The access is at the level of the common femoral artery which has appropriate caliber for closure device utilization. The 8 French sheath was removed over the wire and a Perclose ProGlide closure device was used for access closure. Immediate hemostasis was achieved. 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 PLAN: 1. Patient transferred to ICU for  anesthesia recovery and vasospasm watch. 2. Brilinta can be discontinued while remaining on aspirin 81 mg. Electronically Signed   By: Pedro Earls M.D.   On: 08/07/2019 13:43   IR Angiogram Follow Up Study  Result Date: 08/07/2019 INDICATION: 64 year old male presenting to the emergency with 1 day history of intermittent dysphasia and right hand numbness and weakness. Head CT showed a left sylvian subarachnoid hemorrhage (mFS 1). 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 He had been started on dual anti-platelet therapy with aspirin and Brilinta 5 days ago in anticipation to schedule procedure on 08/07/2019. At admission, he presented mild expressive aphasia with no motor deficit. He was then taken to our service for emergency angiogram and left MCA aneurysm treatment. EXAM: Ultrasound-guided vascular access Diagnostic cerebral angiogram 3D rotational angiogram Balloon assisted coil embolization of brain aneurysm Flat panel CT COMPARISON:  Diagnostic cerebral angiogram July 19, 2019 MEDICATIONS: No antibiotics utilized  during the procedure. A total of 5000 units of heparin IV administered. ANESTHESIA/SEDATION: The procedure was performed under general anesthesia. CONTRAST:  140 cc of Omnipaque 240 FLUOROSCOPY TIME:  Fluoroscopy Time: 72 minutes 18 seconds (3059 mGy). COMPLICATIONS: None immediate. TECHNIQUE: Informed written consent was obtained from the patient after a thorough discussion of the procedural risks, benefits and alternatives. All questions were addressed. Maximal Sterile Barrier Technique was utilized including caps, mask, sterile gowns, sterile gloves, sterile drape, hand hygiene and skin antiseptic. A timeout  was performed prior to the initiation of the procedure. Real-time ultrasound guidance was utilized for vascular access including the acquisition of a permanent ultrasound image documenting patency of the accessed vessel. Using a micropuncture kit and the modified Seldinger technique, access was gained to the right common femoral artery and an 8 French sheath was placed. Then, a 5 [place] DAV catheter was navigated over a 0.035 inch Terumo Glidewire into the aortic arch under fluoroscopy. The catheter was then placed into the left common carotid artery. Frontal and lateral road map was obtained. The catheter was then navigated into the left internal artery. Frontal and lateral angiograms of the head were. 3D rotational angiograms were acquired and post processed in a separate workstation under concurrent attending physician supervision. Selected images were sent to PACS. Magnified frontal and lateral angiograms of the head in working projections were obtained. 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 PROCEDURE: The DAV catheter was exchanged over the wire and under biplane roadmap for a cerabase guide catheter which was placed in the cervical left ICA. A 6 [place] intermediate catheter was then navigated over the [employer] into the petrous segment of the left ICA. Magnified frontal and lateral angiograms of the head were obtained in the working projections. Using biplane roadmap, a headway duo microcatheter was navigated over a synchro 2 micro guidewire into the left MCA bifurcation aneurysm pouch. Attempted primary coiling proved unsuccessful due to wide neck. A 3 x 15 mm Transform compliant balloon was navigated over a synchro 14 micro guidewire in a parallel fashion through the [place] intermediate catheter into the left MCA M2 inferior division branch with the balloon at the level of the aneurysm neck. Then, a 4 mm x 6 cm target 360 soft  framing coil was placed within the aneurysm pouch while the transform balloon was inflated. The balloon was then deflated. The coil mass remained stable within the aneurysm pouch. Control angiogram showed maintained patency of the daughter vessels. The coil was then detached. In a similar fashion, 3 filling and 2 finishing coils were placed within the aneurysm pouch (3 mm x 6 cm target 360 ultra, 3 mm x 4 cm target 360 ultra, 2.5 mm x 4 cm target 360 Nano, 1.5 mm by 4 cm target 360 nano and 1.5 mm x 4 cm target 360 nano). Control angiograms were obtained. The microcatheter was then removed followed by removal of the transform balloon. Follow-up magnified frontal and lateral angiograms of the head in the working projections were obtained showing adequate aneurysm occlusion with good coil packing density and preservation of the parent artery and daughter arteries. Delayed magnified frontal and lateral angiograms of the head in the working projections were obtained showing stable coil mass and no evidence of clot formation on the coil mass surface. Frontal and lateral angiograms with views of the entire head were obtained. No evidence of thromboembolic complication noted. The catheter construct was  subsequently withdrawn. Flat panel CT of the head was obtained and post processed in a separate workstation with concurrent attending physician supervision. Selected images were sent to PACS. Stable appearance of known is left sided subarachnoid hemorrhage with no evidence of intra procedural bleed. A right common femoral artery angiogram with right anterior oblique view was obtained. Mild atherosclerotic changes of the right common femoral artery are noted, without stenosis. The access is at the level of the common femoral artery which has appropriate caliber for closure device utilization. The 8 [place] sheath was removed over the wire and a Perclose ProGlide closure device was used for access closure. Immediate hemostasis  was achieved. 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 PLAN: 1. Patient transferred to ICU for anesthesia recovery and vasospasm watch. 2. Brilinta can be discontinued while remaining on aspirin 81 mg. Electronically Signed   By: Pedro Earls M.D.   On: 08/07/2019 13:43   IR 3D Independent Darreld Mclean  Result Date: 08/07/2019 INDICATION: 64 year old male presenting to the emergency with 1 day history of intermittent dysphasia and right hand numbness and weakness. Head CT showed a left sylvian subarachnoid hemorrhage (mFS 1). 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 He had been started on dual anti-platelet therapy with aspirin and Brilinta 5 days ago in anticipation to schedule procedure on 08/07/2019. At admission, he presented mild expressive aphasia with no motor deficit. He was then taken to our service for emergency angiogram and left MCA aneurysm treatment. EXAM: Ultrasound-guided vascular access Diagnostic cerebral angiogram 3D rotational angiogram Balloon assisted coil embolization of brain aneurysm Flat panel CT COMPARISON:  Diagnostic cerebral angiogram July 19, 2019 MEDICATIONS: No antibiotics utilized during the procedure. A total of 5000 units of heparin IV administered. ANESTHESIA/SEDATION: The procedure was performed under general anesthesia. CONTRAST:  140 cc of Omnipaque 240 FLUOROSCOPY TIME:  Fluoroscopy Time: 72 minutes 18 seconds (3059 mGy). COMPLICATIONS: None immediate. TECHNIQUE: Informed written consent was obtained from the patient after a thorough discussion of the procedural risks, benefits and alternatives. All  questions were addressed. Maximal Sterile Barrier Technique was utilized including caps, mask, sterile gowns, sterile gloves, sterile drape, hand hygiene and skin antiseptic. A timeout was performed prior to the initiation of the procedure. Real-time ultrasound guidance was utilized for vascular access including the acquisition of a permanent ultrasound image documenting patency of the accessed vessel. Using a micropuncture kit and the modified Seldinger technique, access was gained to the right common femoral artery and an 8 French sheath was placed. Then, a 5 [place] DAV catheter was navigated over a 0.035 inch Terumo Glidewire into the aortic arch under fluoroscopy. The catheter was then placed into the left common carotid artery. Frontal and lateral road map was obtained. The catheter was then navigated into the left internal artery. Frontal and lateral angiograms of the head were. 3D rotational angiograms were acquired and post processed in a separate workstation under concurrent attending physician supervision. Selected images were sent to PACS. Magnified frontal and lateral angiograms of the head in working projections were obtained. 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 PROCEDURE: The DAV  catheter was exchanged over the wire and under biplane roadmap for a cerabase guide catheter which was placed in the cervical left ICA. A 6 [place] intermediate catheter was then navigated over the [employer] into the petrous segment of the left ICA. Magnified frontal and lateral angiograms of the head were obtained in the working projections. Using biplane roadmap, a headway duo microcatheter was navigated over a synchro 2 micro guidewire into the left MCA bifurcation aneurysm pouch. Attempted primary coiling proved unsuccessful due to wide neck. A 3 x 15 mm Transform compliant balloon was navigated over a synchro 14 micro guidewire in a parallel  fashion through the [place] intermediate catheter into the left MCA M2 inferior division branch with the balloon at the level of the aneurysm neck. Then, a 4 mm x 6 cm target 360 soft framing coil was placed within the aneurysm pouch while the transform balloon was inflated. The balloon was then deflated. The coil mass remained stable within the aneurysm pouch. Control angiogram showed maintained patency of the daughter vessels. The coil was then detached. In a similar fashion, 3 filling and 2 finishing coils were placed within the aneurysm pouch (3 mm x 6 cm target 360 ultra, 3 mm x 4 cm target 360 ultra, 2.5 mm x 4 cm target 360 Nano, 1.5 mm by 4 cm target 360 nano and 1.5 mm x 4 cm target 360 nano). Control angiograms were obtained. The microcatheter was then removed followed by removal of the transform balloon. Follow-up magnified frontal and lateral angiograms of the head in the working projections were obtained showing adequate aneurysm occlusion with good coil packing density and preservation of the parent artery and daughter arteries. Delayed magnified frontal and lateral angiograms of the head in the working projections were obtained showing stable coil mass and no evidence of clot formation on the coil mass surface. Frontal and lateral angiograms with views of the entire head were obtained. No evidence of thromboembolic complication noted. The catheter construct was subsequently withdrawn. Flat panel CT of the head was obtained and post processed in a separate workstation with concurrent attending physician supervision. Selected images were sent to PACS. Stable appearance of known is left sided subarachnoid hemorrhage with no evidence of intra procedural bleed. A right common femoral artery angiogram with right anterior oblique view was obtained. Mild atherosclerotic changes of the right common femoral artery are noted, without stenosis. The access is at the level of the common femoral artery which has  appropriate caliber for closure device utilization. The 8 French sheath was removed over the wire and a Perclose ProGlide closure device was used for access closure. Immediate hemostasis was achieved. 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 PLAN: 1. Patient transferred to ICU for anesthesia recovery and vasospasm watch. 2. Brilinta can be discontinued while remaining on aspirin 81 mg. Electronically Signed   By: Pedro Earls M.D.   On: 08/07/2019 13:43   IR CT Head Ltd  Result Date: 08/07/2019 INDICATION: 64 year old male presenting to the emergency with 1 day history of intermittent dysphasia and right hand numbness and weakness. Head CT showed a left sylvian subarachnoid hemorrhage (mFS 1). 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 The bleeding pattern was consistent with rupture of  his known left MCA bifurcation aneurysm. He had been started on dual anti-platelet therapy with aspirin and Brilinta 5 days ago in anticipation to schedule procedure on 08/07/2019. At admission, he presented mild expressive aphasia with no motor deficit. He was then taken to our service for emergency angiogram and left MCA aneurysm treatment. EXAM: Ultrasound-guided vascular access Diagnostic cerebral angiogram 3D rotational angiogram Balloon assisted coil embolization of brain aneurysm Flat panel CT COMPARISON:  Diagnostic cerebral angiogram July 19, 2019 MEDICATIONS: No antibiotics utilized during the procedure. A total of 5000 units of heparin IV administered. ANESTHESIA/SEDATION: The procedure was performed under general anesthesia. CONTRAST:  140 cc of Omnipaque 240 FLUOROSCOPY TIME:  Fluoroscopy Time: 72 minutes 18 seconds (3059 mGy).  COMPLICATIONS: None immediate. TECHNIQUE: Informed written consent was obtained from the patient after a thorough discussion of the procedural risks, benefits and alternatives. All questions were addressed. Maximal Sterile Barrier Technique was utilized including caps, mask, sterile gowns, sterile gloves, sterile drape, hand hygiene and skin antiseptic. A timeout was performed prior to the initiation of the procedure. Real-time ultrasound guidance was utilized for vascular access including the acquisition of a permanent ultrasound image documenting patency of the accessed vessel. Using a micropuncture kit and the modified Seldinger technique, access was gained to the right common femoral artery and an 8 French sheath was placed. Then, a 5 [place] DAV catheter was navigated over a 0.035 inch Terumo Glidewire into the aortic arch under fluoroscopy. The catheter was then placed into the left common carotid artery. Frontal and lateral road map was obtained. The catheter was then navigated into the left internal artery. Frontal and lateral angiograms of the head were. 3D rotational angiograms were acquired and post processed in a separate workstation under concurrent attending physician supervision. Selected images were sent to PACS. Magnified frontal and lateral angiograms of the head in working projections were obtained. 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 PROCEDURE: The DAV catheter was exchanged over the wire and under biplane roadmap for a cerabase guide catheter which was placed in the cervical left ICA. A 6 [place] intermediate catheter was then navigated over the [employer] into the petrous segment of the left ICA. Magnified frontal and lateral angiograms of the head were obtained in the working projections. Using biplane roadmap, a headway duo microcatheter was navigated over a synchro 2 micro guidewire into the left MCA bifurcation  aneurysm pouch. Attempted primary coiling proved unsuccessful due to wide neck. A 3 x 15 mm Transform compliant balloon was navigated over a synchro 14 micro guidewire in a parallel fashion through the [place] intermediate catheter into the left MCA M2 inferior division branch with the balloon at the level of the aneurysm neck. Then, a 4 mm x 6 cm target 360 soft framing coil was placed within the aneurysm pouch while the transform balloon was inflated. The balloon was then deflated. The coil mass remained stable within the aneurysm pouch. Control angiogram showed maintained patency of the daughter vessels. The coil was then detached. In a similar fashion, 3 filling and 2 finishing coils were placed within the aneurysm pouch (3 mm x 6 cm target 360 ultra, 3 mm x 4 cm target 360 ultra, 2.5 mm x 4 cm target 360 Nano, 1.5 mm by 4 cm target 360 nano and 1.5 mm x 4 cm target 360 nano). Control angiograms were obtained. The microcatheter was then removed followed by removal of the transform  balloon. Follow-up magnified frontal and lateral angiograms of the head in the working projections were obtained showing adequate aneurysm occlusion with good coil packing density and preservation of the parent artery and daughter arteries. Delayed magnified frontal and lateral angiograms of the head in the working projections were obtained showing stable coil mass and no evidence of clot formation on the coil mass surface. Frontal and lateral angiograms with views of the entire head were obtained. No evidence of thromboembolic complication noted. The catheter construct was subsequently withdrawn. Flat panel CT of the head was obtained and post processed in a separate workstation with concurrent attending physician supervision. Selected images were sent to PACS. Stable appearance of known is left sided subarachnoid hemorrhage with no evidence of intra procedural bleed. A right common femoral artery angiogram with right anterior oblique  view was obtained. Mild atherosclerotic changes of the right common femoral artery are noted, without stenosis. The access is at the level of the common femoral artery which has appropriate caliber for closure device utilization. The 8 French sheath was removed over the wire and a Perclose ProGlide closure device was used for access closure. Immediate hemostasis was achieved. 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 PLAN: 1. Patient transferred to ICU for anesthesia recovery and vasospasm watch. 2. Brilinta can be discontinued while remaining on aspirin 81 mg. Electronically Signed   By: Pedro Earls M.D.   On: 08/07/2019 13:43   IR US Guide Vasc Access Right  Result Date: 08/07/2019 INDICATION: 64 year old male presenting to the emergency with 1 day history of intermittent dysphasia and right hand numbness and weakness. Head CT showed a left sylvian subarachnoid hemorrhage (mFS 1). 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 He had been started on dual anti-platelet therapy with aspirin and Brilinta 5 days ago in anticipation to schedule procedure on 08/07/2019. At admission, he presented mild expressive aphasia with no motor deficit. He was then taken to our service for emergency angiogram and left MCA aneurysm treatment. EXAM: Ultrasound-guided vascular access Diagnostic cerebral angiogram 3D rotational angiogram Balloon assisted coil embolization of brain aneurysm Flat panel CT COMPARISON:  Diagnostic cerebral angiogram July 19, 2019 MEDICATIONS: No antibiotics utilized during the procedure. A total of 5000 units of heparin IV  administered. ANESTHESIA/SEDATION: The procedure was performed under general anesthesia. CONTRAST:  140 cc of Omnipaque 240 FLUOROSCOPY TIME:  Fluoroscopy Time: 72 minutes 18 seconds (3059 mGy). COMPLICATIONS: None immediate. TECHNIQUE: Informed written consent was obtained from the patient after a thorough discussion of the procedural risks, benefits and alternatives. All questions were addressed. Maximal Sterile Barrier Technique was utilized including caps, mask, sterile gowns, sterile gloves, sterile drape, hand hygiene and skin antiseptic. A timeout was performed prior to the initiation of the procedure. Real-time ultrasound guidance was utilized for vascular access including the acquisition of a permanent ultrasound image documenting patency of the accessed vessel. Using a micropuncture kit and the modified Seldinger technique, access was gained to the right common femoral artery and an 8 French sheath was placed. Then, a 5 [place] DAV catheter was navigated over a 0.035 inch Terumo Glidewire into the aortic arch under fluoroscopy. The catheter was then placed  into the left common carotid artery. Frontal and lateral road map was obtained. The catheter was then navigated into the left internal artery. Frontal and lateral angiograms of the head were. 3D rotational angiograms were acquired and post processed in a separate workstation under concurrent attending physician supervision. Selected images were sent to PACS. Magnified frontal and lateral angiograms of the head in working projections were obtained. 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 PROCEDURE: The DAV catheter was exchanged over the wire and under biplane roadmap for a cerabase guide catheter which was placed in the cervical left ICA. A 6 [place] intermediate catheter was then navigated over the [employer] into the petrous segment of the left ICA. Magnified frontal and lateral  angiograms of the head were obtained in the working projections. Using biplane roadmap, a headway duo microcatheter was navigated over a synchro 2 micro guidewire into the left MCA bifurcation aneurysm pouch. Attempted primary coiling proved unsuccessful due to wide neck. A 3 x 15 mm Transform compliant balloon was navigated over a synchro 14 micro guidewire in a parallel fashion through the [place] intermediate catheter into the left MCA M2 inferior division branch with the balloon at the level of the aneurysm neck. Then, a 4 mm x 6 cm target 360 soft framing coil was placed within the aneurysm pouch while the transform balloon was inflated. The balloon was then deflated. The coil mass remained stable within the aneurysm pouch. Control angiogram showed maintained patency of the daughter vessels. The coil was then detached. In a similar fashion, 3 filling and 2 finishing coils were placed within the aneurysm pouch (3 mm x 6 cm target 360 ultra, 3 mm x 4 cm target 360 ultra, 2.5 mm x 4 cm target 360 Nano, 1.5 mm by 4 cm target 360 nano and 1.5 mm x 4 cm target 360 nano). Control angiograms were obtained. The microcatheter was then removed followed by removal of the transform balloon. Follow-up magnified frontal and lateral angiograms of the head in the working projections were obtained showing adequate aneurysm occlusion with good coil packing density and preservation of the parent artery and daughter arteries. Delayed magnified frontal and lateral angiograms of the head in the working projections were obtained showing stable coil mass and no evidence of clot formation on the coil mass surface. Frontal and lateral angiograms with views of the entire head were obtained. No evidence of thromboembolic complication noted. The catheter construct was subsequently withdrawn. Flat panel CT of the head was obtained and post processed in a separate workstation with concurrent attending physician supervision. Selected images were  sent to PACS. Stable appearance of known is left sided subarachnoid hemorrhage with no evidence of intra procedural bleed. A right common femoral artery angiogram with right anterior oblique view was obtained. Mild atherosclerotic changes of the right common femoral artery are noted, without stenosis. The access is at the level of the common femoral artery which has appropriate caliber for closure device utilization. The 8 French sheath was removed over the wire and a Perclose ProGlide closure device was used for access closure. Immediate hemostasis was achieved. 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 PLAN: 1. Patient transferred to ICU for anesthesia recovery and vasospasm watch. 2. Brilinta can be discontinued while remaining on aspirin 81 mg. Electronically Signed   By: Pedro Earls M.D.   On:  08/07/2019 13:43   Chest Port 1 View  Result Date: 08/06/2019 CLINICAL DATA:  64 year old male code stroke presentation with multiple intracranial aneurysms on MRA/conventional cerebral angiogram and presenting with aneurysmal type subarachnoid hemorrhage today. EXAM: PORTABLE CHEST 1 VIEW COMPARISON:  None. FINDINGS: Portable AP upright view at 1320 hours. Lung volumes and mediastinal contours are within normal limits. Visualized tracheal air column is within normal limits. Allowing for portable technique the lungs are clear. No pneumothorax. No acute osseous abnormality identified. IMPRESSION: No acute cardiopulmonary abnormality. Electronically Signed   By: Genevie Ann M.D.   On: 08/06/2019 13:28   EEG adult  Result Date: 08/07/2019 [name], MD     08/07/2019  2:03 PM Patient Name: Victor Pena MRN: [id number] Epilepsy Attending: [name] Referring Physician/Provider: Dr. Rosalin Hawking Date: 08/07/2019 Duration: 24.20 minutes Patient history: 64 year old male with left frontoparietal aneurysmal  subarachnoid hemorrhage status post coiling.  EEG evaluate for seizures. Level of alertness: Awake, drowsy AEDs during EEG study: None Technical aspects: This EEG study was done with scalp electrodes positioned according to the 10-20 International system of electrode placement. Electrical activity was acquired at a sampling rate of 500Hz  and reviewed with a high frequency filter of 70Hz  and a low frequency filter of 1Hz . EEG data were recorded continuously and digitally stored. Description: The posterior dominant rhythm consists of 9 Hz activity of moderate voltage (25-35 uV) seen predominantly in posterior head regions, symmetric and reactive to eye opening and eye closing.  Drowsiness was characterized by attenuation of the posterior background rhythm and roving eye movements. Hyperventilation and photic stimulation were not performed. IMPRESSION: This study is within normal limits. No seizures or epileptiform discharges were seen throughout the recording. [name]   ECHOCARDIOGRAM COMPLETE  Result Date: 08/07/2019    ECHOCARDIOGRAM REPORT   Patient Name:   Brandy A Stradford Date of Exam: 08/07/2019 Medical Rec #:  [id number]      Height:       71.0 in Accession #:    [id number]     Weight:       190.0 lb Date of Birth:  1956-02-03      BSA:          2.063 m Patient Age:    56 years       BP:           109/65 mmHg Patient Gender: M              HR:           79 bpm. Exam Location:  Inpatient Procedure: 2D Echo Indications:    Stroke I163.9  History:        Patient has no prior history of Echocardiogram examinations.  Sonographer:    Mikki Santee RDCS (AE) Referring Phys: [id number] [place]  1. Left ventricular ejection fraction, by estimation, is 60 to 65%. The left ventricle has normal function. The left ventricle has no regional wall motion abnormalities. Left ventricular diastolic parameters are consistent with Grade II diastolic dysfunction (pseudonormalization).  2. Right ventricular  systolic function is normal. The right ventricular size is normal.  3. The mitral valve is normal in structure. No evidence of mitral valve regurgitation. No evidence of mitral stenosis.  4. The aortic valve was not well visualized. Aortic valve regurgitation is not visualized. No aortic stenosis is present.  5. Aortic dilatation noted. 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  6. The inferior  vena cava is normal in size with greater than 50% respiratory variability, suggesting right atrial pressure of 3 mmHg. Conclusion(s)/Recommendation(s): No intracardiac source of embolism detected on this transthoracic study. A transesophageal echocardiogram is recommended to exclude cardiac source of embolism if clinically indicated. FINDINGS  Left Ventricle: Left ventricular ejection fraction, by estimation, is 60 to 65%. The left ventricle has normal function. The left ventricle has no regional wall motion abnormalities. The left ventricular internal cavity size was normal in size. There is  no left ventricular hypertrophy. Left ventricular diastolic parameters are consistent with Grade II diastolic dysfunction (pseudonormalization). Right Ventricle: The right ventricular size is normal. No increase in right ventricular wall thickness. Right ventricular systolic function is normal. Left Atrium: Left atrial size was normal in size. Right Atrium: Right atrial size was normal in size. Pericardium: There is no evidence of pericardial effusion. Mitral Valve: The mitral valve is normal in structure. Normal mobility of the mitral valve leaflets. No evidence of mitral valve regurgitation. No evidence of mitral valve stenosis. Tricuspid Valve: The tricuspid valve is normal in structure. Tricuspid valve regurgitation is not demonstrated. No evidence of tricuspid stenosis. Aortic Valve: The aortic valve was not well visualized. Aortic valve regurgitation is not visualized. No aortic stenosis is present. Pulmonic  Valve: The pulmonic valve was normal in structure. Pulmonic valve regurgitation is not visualized. No evidence of pulmonic stenosis. Aorta: Aortic dilatation noted. 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 Venous: The inferior vena cava is normal in size with greater than 50% respiratory variability, suggesting right atrial pressure of 3 mmHg. IAS/Shunts: No atrial level shunt detected by color flow Doppler.  LEFT VENTRICLE PLAX 2D LVIDd:         4.50 cm  Diastology LVIDs:         3.00 cm  LV e' lateral:   9.57 cm/s LV PW:         1.00 cm  LV E/e' lateral: 6.7 LV IVS:        1.00 cm  LV e' medial:    9.36 cm/s LVOT diam:     2.70 cm  LV E/e' medial:  6.8 LV SV:         102 LV SV Index:   49 LVOT Area:     5.73 cm  RIGHT VENTRICLE RV S prime:     15.20 cm/s TAPSE (M-mode): 1.7 cm LEFT ATRIUM             Index       RIGHT ATRIUM           Index LA diam:        3.20 cm 1.55 cm/m  RA Area:     14.30 cm LA Vol (A2C):   53.6 ml 25.98 ml/m RA Volume:   34.90 ml  16.92 ml/m LA Vol (A4C):   43.6 ml 21.13 ml/m LA Biplane Vol: 49.6 ml 24.04 ml/m  AORTIC VALVE LVOT Vmax:   87.50 cm/s LVOT Vmean:  58.100 cm/s LVOT VTI:    0.178 m  AORTA Ao Root diam: 3.80 cm MITRAL VALVE MV Area (PHT): 2.52 cm    SHUNTS MV Decel Time: 301 msec    Systemic VTI:  0.18 m MV E velocity: 63.80 cm/s  Systemic Diam: 2.70 cm MV A velocity: 57.00 cm/s MV E/A ratio:  1.12 Candee Furbish MD Electronically signed by Candee Furbish MD Signature Date/Time: 08/07/2019/11:13:11 AM    Final    CT HEAD CODE STROKE  WO CONTRAST  Addendum Date: 08/06/2019   ADDENDUM REPORT: 08/06/2019 12:51 ADDENDUM: These results were called by telephone at the time of interpretation on 08/06/2019 at 12:51 pm to provider Dr. Cheral Marker, who verbally acknowledged these results. Electronically Signed   By: Kellie Simmering DO   On: 08/06/2019 12:51   Result Date: 08/06/2019 CLINICAL DATA:  Code stroke. Neuro deficit, acute, stroke suspected. Difficulty speaking.  EXAM: CT HEAD WITHOUT CONTRAST TECHNIQUE: Contiguous axial images were obtained from the base of the skull through the vertex without intravenous contrast. COMPARISON:  MRI/MRA head 07/10/2019. FINDINGS: Brain: There is moderate volume acute subareolar hemorrhage greatest along the left frontal lobe convexity and within the left sylvian fissure. No acute demarcated cortical infarct is identified. No extra-axial fluid collection. No evidence of intracranial mass. No midline shift. Vascular: No hyperdense vessel. Skull: Normal. Negative for fracture or focal lesion. Sinuses/Orbits: Visualized orbits show no acute finding. Mild ethmoid sinus mucosal thickening. No significant mastoid effusion. IMPRESSION: Moderate volume acute subarachnoid hemorrhage greatest along the left frontal lobe convexity and within the left sylvian fissure. 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 No hydrocephalus. No acute demarcated cortical infarct. Electronically Signed: By: Kellie Simmering DO On: 08/06/2019 12:39   VAS [place] TRANSCRANIAL DOPPLER  Result Date: 08/08/2019  Transcranial Doppler Indications: Subarachnoid hemorrhage. Performing Technologist: June Leap RDMS, RVT  Examination Guidelines: A complete evaluation includes B-mode imaging, spectral Doppler, color Doppler, and power Doppler as needed of all accessible portions of each vessel. Bilateral testing is considered an integral part of a complete examination. Limited examinations for reoccurring indications may be performed as noted.  +----------+-------------+----------+-----------+-------+ RIGHT TCD Right VM (cm)Depth (cm)PulsatilityComment +----------+-------------+----------+-----------+-------+ MCA           88.00                 1.24            +----------+-------------+----------+-----------+-------+ ACA          -61.00                  1.02            +----------+-------------+----------+-----------+-------+ Term ICA      47.00                 0.89            +----------+-------------+----------+-----------+-------+ PCA           37.00                 1.15            +----------+-------------+----------+-----------+-------+ Opthalmic     24.00                 2.04            +----------+-------------+----------+-----------+-------+ ICA siphon    31.00                 1.76            +----------+-------------+----------+-----------+-------+ Vertebral    -20.00                 1.14            +----------+-------------+----------+-----------+-------+  +----------+------------+----------+-----------+-------+ LEFT TCD  Left VM (cm)Depth (cm)PulsatilityComment +----------+------------+----------+-----------+-------+ MCA          74.00  1.22            +----------+------------+----------+-----------+-------+ ACA          -31.00                 1.2            +----------+------------+----------+-----------+-------+ Term ICA     36.00                 1.59            +----------+------------+----------+-----------+-------+ PCA          54.00                 1.08            +----------+------------+----------+-----------+-------+ Opthalmic    25.00                 2.39            +----------+------------+----------+-----------+-------+ ICA siphon   36.00                 0.93            +----------+------------+----------+-----------+-------+ Vertebral    -25.00                1.25            +----------+------------+----------+-----------+-------+  +------------+-------+-------+             VM cm/sComment +------------+-------+-------+ Prox Basilar-33.00         +------------+-------+-------+ Dist Basilar-29.00         +------------+-------+-------+ Summary:  Mildly elevated bilateral middle cerebral artery mean flow velocities of unclear  significance.No definite vasospasm noted. *See table(s) above for TCD measurements and observations.  Diagnosing physician: Antony Contras MD Electronically signed by Antony Contras MD on 08/08/2019 at 8:16:03 AM.    Final    IR NEURO EACH ADD'L AFTER BASIC UNI LEFT (MS)  Result Date: 08/07/2019 INDICATION: 64 year old male presenting to the emergency with 1 day history of intermittent dysphasia and right hand numbness and weakness. Head CT showed a left sylvian subarachnoid hemorrhage (mFS 1). 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 He had been started on dual anti-platelet therapy with aspirin and Brilinta 5 days ago in anticipation to schedule procedure on 08/07/2019. At admission, he presented mild expressive aphasia with no motor deficit. He was then taken to our service for emergency angiogram and left MCA aneurysm treatment. EXAM: Ultrasound-guided vascular access Diagnostic cerebral angiogram 3D rotational angiogram Balloon assisted coil embolization of brain aneurysm Flat panel CT COMPARISON:  Diagnostic cerebral angiogram July 19, 2019 MEDICATIONS: No antibiotics utilized during the procedure. A total of 5000 units of heparin IV administered. ANESTHESIA/SEDATION: The procedure was performed under general anesthesia. CONTRAST:  140 cc of Omnipaque 240 FLUOROSCOPY TIME:  Fluoroscopy Time: 72 minutes 18 seconds (3059 mGy). COMPLICATIONS: None immediate. TECHNIQUE: Informed written consent was obtained from the patient after a thorough discussion of the procedural risks, benefits and alternatives. All questions were addressed. Maximal Sterile Barrier Technique was utilized including caps, mask, sterile gowns, sterile gloves, sterile drape,  hand hygiene and skin antiseptic. A timeout was performed prior to the initiation of the procedure. Real-time ultrasound guidance was utilized for vascular access including  the acquisition of a permanent ultrasound image documenting patency of the accessed vessel. Using a micropuncture kit and the modified Seldinger technique, access was gained to the right common femoral artery and an 8 French sheath was placed. Then, a 5 [place] DAV catheter was navigated over a 0.035 inch Terumo Glidewire into the aortic arch under fluoroscopy. The catheter was then placed into the left common carotid artery. Frontal and lateral road map was obtained. The catheter was then navigated into the left internal artery. Frontal and lateral angiograms of the head were. 3D rotational angiograms were acquired and post processed in a separate workstation under concurrent attending physician supervision. Selected images were sent to PACS. Magnified frontal and lateral angiograms of the head in working projections were obtained. 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 PROCEDURE: The DAV catheter was exchanged over the wire and under biplane roadmap for a cerabase guide catheter which was placed in the cervical left ICA. A 6 [place] intermediate catheter was then navigated over the [employer] into the petrous segment of the left ICA. Magnified frontal and lateral angiograms of the head were obtained in the working projections. Using biplane roadmap, a headway duo microcatheter was navigated over a synchro 2 micro guidewire into the left MCA bifurcation aneurysm pouch. Attempted primary coiling proved unsuccessful due to wide neck. A 3 x 15 mm Transform compliant balloon was navigated over a synchro 14 micro guidewire in a parallel fashion through the [place] intermediate catheter into the left MCA M2 inferior division branch with the balloon at the level of the aneurysm  neck. Then, a 4 mm x 6 cm target 360 soft framing coil was placed within the aneurysm pouch while the transform balloon was inflated. The balloon was then deflated. The coil mass remained stable within the aneurysm pouch. Control angiogram showed maintained patency of the daughter vessels. The coil was then detached. In a similar fashion, 3 filling and 2 finishing coils were placed within the aneurysm pouch (3 mm x 6 cm target 360 ultra, 3 mm x 4 cm target 360 ultra, 2.5 mm x 4 cm target 360 Nano, 1.5 mm by 4 cm target 360 nano and 1.5 mm x 4 cm target 360 nano). Control angiograms were obtained. The microcatheter was then removed followed by removal of the transform balloon. Follow-up magnified frontal and lateral angiograms of the head in the working projections were obtained showing adequate aneurysm occlusion with good coil packing density and preservation of the parent artery and daughter arteries. Delayed magnified frontal and lateral angiograms of the head in the working projections were obtained showing stable coil mass and no evidence of clot formation on the coil mass surface. Frontal and lateral angiograms with views of the entire head were obtained. No evidence of thromboembolic complication noted. The catheter construct was subsequently withdrawn. Flat panel CT of the head was obtained and post processed in a separate workstation with concurrent attending physician supervision. Selected images were sent to PACS. Stable appearance of known is left sided subarachnoid hemorrhage with no evidence of intra procedural bleed. A right common femoral artery angiogram with right anterior oblique view was obtained. Mild atherosclerotic changes of the right common femoral artery are noted, without stenosis. The access is at the level of the common femoral artery which has appropriate caliber for closure device utilization. The 8 French sheath was removed over the wire and a Perclose ProGlide closure device was used  for access closure. Immediate hemostasis was achieved. 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 PLAN: 1. Patient transferred to ICU for anesthesia recovery and vasospasm watch. 2. Brilinta can be discontinued while remaining on aspirin 81 mg. Electronically Signed   By: Pedro Earls M.D.   On: 08/07/2019 13:43    Labs:  CBC: Recent Labs    08/06/19 1217 08/06/19 1217 08/06/19 1226 08/07/19 0631 08/08/19 0219 08/09/19 0916  WBC 7.7  --   --  9.9 7.7 7.4  HGB 13.9   < > 14.6 12.2* 11.9* 13.2  HCT 41.4   < > 43.0 36.0* 35.6* 40.0  PLT 264  --   --  239 246 269   < > = values in this interval not displayed.    COAGS: Recent Labs    07/15/19 1344 08/06/19 1217 08/06/19 1244  INR 1.0 1.0 1.0  APTT 29 27 28     BMP: Recent Labs    08/06/19 1217 08/06/19 1217 08/06/19 1226 08/07/19 0631 08/08/19 0219 08/09/19 0916  NA 136   < > 138 136 139 138  K 3.9   < > 3.8 3.9 4.2 3.7  CL 102   < > 103 104 107 106  CO2 21*  --   --  17* 25 22  GLUCOSE 113*   < > 106* 114* 105* 104*  BUN 15   < > 17 15 15 15   CALCIUM 9.6  --   --  8.1* 8.6* 9.0  CREATININE 1.01   < > 0.80 0.80 0.86 0.77  GFRNONAA >60  --   --  >60 >60 >60  GFRAA >60  --   --  >60 >60 >60   < > = values in this interval not displayed.    LIVER FUNCTION TESTS: Recent Labs    08/06/19 1217  BILITOT 0.9  AST 49*  ALT 46*  ALKPHOS 60  PROT 7.4  ALBUMIN 4.0    Assessment and Plan:  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 Karenann Cai. Patient's condition stable- demonstrates expressive aphasia that continues to improve, no additional neurologic symptoms noted. Patient and wife had questions regarding hospital plans- all questions answered and concerns addressed. Plan for TCD  today to evaluate for vasospasm. Continue speech therapy/PT/OT. Arterial line to remain per Dr. Karenann Cai. Continue taking Aspirin 81 mg once daily. Further plans per neurology- appreciate and agree with management. NIR to follow.   Electronically Signed: Earley Abide, PA-C 08/09/2019, 12:19 PM   I spent a total of 25 Minutes at the the patient's bedside AND on the patient's hospital floor or unit, greater than 50% of which was counseling/coordinating care for ruptured left MCA bifurcation aneurysm s/p coiling.

## 2019-08-09 NOTE — Progress Notes (Signed)
  Speech Language Pathology Treatment: Cognitive-Linquistic(Apraxia)  Patient Details Name: Victor Pena MRN: [id number] DOB: 10-08-1955 Today's Date: 08/09/2019 Time: 9381-8299 SLP Time Calculation (min) (ACUTE ONLY): 34 min  Assessment / Plan / Recommendation Clinical Impression  Pt was seen for apraxia treatment and was cooperative throughout the session. Pt's wife expressed that she was unaware that the pt had already been evaluated by speech pathology. Pt and his wife were educated regarding the results of the evaluation, the distinction of speech vs language, the nature of apraxia, and the severity of the pt's impairment. Apraxia handout was used to facilitate comprehension and both parties verbalized understanding. Pt demonstrated 80% accuracy with production of words with increasing length increasing to 100% with repetition. He achieved 70% accuracy with production of phrases with increasing length with improvement to 100% with repeated attempts. He independently read 5-7 word sentences with intelligible speech and was able to self-correct errors. Pt exhibited difficulty with production of multisyllabic words during conversational speech but this improved with his independent use of a reduced production rate. To improve kinestetic awareness and spatial targeting, pt imitated minimal contrast words and 80% accuracy was demonstrated increasing to 100% with repetition.    HPI HPI: 64 y.o. male with a history of intracranial aneurysms and diplopia.  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  He underwent a diagnostic cerebral angiogram on 07/19/2019 that showed a 4.4 x 4.2 mm wide neck left MCA bifurcation aneurysm and a blisterlike right ICA bifurcation aneurysm measuring 5.6 x 3 0.1 mm with superimposed pseudolobation. Pt presented to ED with intermittent dysphasia, HA, and difficulty utilizing R hand. CT revealed SAH. Pt now s/p coiling.      SLP Plan  Continue  with current plan of care       Recommendations                   Follow up Recommendations: Outpatient SLP SLP Visit Diagnosis: Apraxia (R48.2) Plan: Continue with current plan of care       Victor Pena I. Vear Clock, MS, CCC-SLP Acute Rehabilitation Services Office number [phone] Pager [phone]                [name] 08/09/2019, 6:26 PM

## 2019-08-09 NOTE — Progress Notes (Signed)
TCD has been completed. Preliminary results can be found in CV Proc through chart review.   08/09/19 12:20 PM Olen Cordial RVT

## 2019-08-10 LAB — BASIC METABOLIC PANEL
Anion gap: 9 (ref 5–15)
BUN: 20 mg/dL (ref 8–23)
CO2: 25 mmol/L (ref 22–32)
Calcium: 9.2 mg/dL (ref 8.9–10.3)
Chloride: 105 mmol/L (ref 98–111)
Creatinine, Ser: 0.96 mg/dL (ref 0.61–1.24)
GFR calc Af Amer: >60 mL/min (ref >60)
GFR calc non Af Amer: >60 mL/min (ref >60)
Glucose, Bld: 101 mg/dL — ABNORMAL HIGH (ref 70–99)
Potassium: 4.2 mmol/L (ref 3.5–5.1)
Sodium: 139 mmol/L (ref 135–145)

## 2019-08-10 LAB — TYPE AND SCREEN
ABO/RH(D): O NEG
Antibody Screen: NEGATIVE
Unit division: 0
Unit division: 0
Unit division: 0
Unit division: 0

## 2019-08-10 LAB — BPAM RBC
Blood Product Expiration Date: 202105242359
Blood Product Expiration Date: 202105242359
Blood Product Expiration Date: 202105242359
Blood Product Expiration Date: 202105252359
Unit Type and Rh: 9500
Unit Type and Rh: 9500
Unit Type and Rh: 9500
Unit Type and Rh: 9500

## 2019-08-10 LAB — CBC
HCT: 38.4 % — ABNORMAL LOW (ref 39.0–52.0)
Hemoglobin: 13 g/dL (ref 13.0–17.0)
MCH: 35.4 pg — ABNORMAL HIGH (ref 26.0–34.0)
MCHC: 33.9 g/dL (ref 30.0–36.0)
MCV: 104.6 fL — ABNORMAL HIGH (ref 80.0–100.0)
Platelets: 296 10*3/uL (ref 150–400)
RBC: 3.67 MIL/uL — ABNORMAL LOW (ref 4.22–5.81)
RDW: 12.8 % (ref 11.5–15.5)
WBC: 7.2 10*3/uL (ref 4.0–10.5)
nRBC: 0 % (ref 0.0–0.2)

## 2019-08-10 MED ORDER — LORAZEPAM 2 MG/ML IJ SOLN
1.0000 mg | Freq: Once | INTRAMUSCULAR | Status: AC
  Administered 2019-08-10: 1 mg via INTRAVENOUS
  Filled 2019-08-10: qty 1

## 2019-08-10 NOTE — Progress Notes (Signed)
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 Karenann Cai.  Improving daily Speech slow in am-- better as day goes- but tires by evening  TCDs from yesterday-pending report  Allergies: Other  Medications: Prior to Admission medications   Medication Sig Start Date End Date Taking? Authorizing Provider  acetaminophen (TYLENOL) 500 MG tablet Take 1,000 mg by mouth every 6 (six) hours as needed for mild pain or headache.   Yes [provider]  aspirin 81 MG EC tablet Take 81 mg by mouth daily. Swallow whole.   Yes [provider]  ibuprofen (ADVIL) 200 MG tablet Take 400 mg by mouth every 6 (six) hours as needed for moderate pain.   Yes [provider]  ticagrelor (BRILINTA) 90 MG TABS tablet Take 90 mg by mouth daily.    Yes [provider]  diazepam (VALIUM) 5 MG tablet Take 1 tablet 30 mins prior to MRI. May take second dose if needed. Patient not taking: Reported on 07/17/2019 07/02/19   [name], MD     Vital Signs: BP 129/85   Pulse 64   Temp 98.4 F (36.9 C) (Oral)   Resp 16   Ht 5' 11"  (1.803 m)   Wt 190 lb (86.2 kg)   SpO2 98%   BMI 26.50 kg/m   Physical Exam Vitals reviewed.  HENT:     Mouth/Throat:     Mouth: Mucous membranes are moist.     Comments: Smile = Tongue midline Face symmetrical Musculoskeletal:     Comments: Moves all 4s = Good strength and sensation  Neurological:     Mental Status: [name] alert and oriented to person, place, and time.  Psychiatric:        Behavior: Behavior normal.     Imaging: MR BRAIN WO CONTRAST  Result Date: 08/07/2019 CLINICAL DATA:  Initial  evaluation for worsened speech, status post coil embolization of MCA aneurysm. EXAM: MRI HEAD WITHOUT CONTRAST TECHNIQUE: Multiplanar, multiecho pulse sequences of the brain and surrounding structures were obtained without intravenous contrast. COMPARISON:  Prior head CT from 08/06/2019. FINDINGS: Brain: Mild age-related cerebral atrophy. Scattered susceptibility artifact and T1 hyperintensity seen throughout the left sylvian fissure and overlying left frontoparietal sulci, consistent with subarachnoid hemorrhage, similar to previous CT. Scattered serpiginous diffusion signal abnormality seen throughout this region, favored in large part to be related to the underlying subarachnoid blood, although superimposed small volume ischemic changes could be present as well, difficult to exclude. There are a few scattered punctate foci of diffusion abnormality involving the cortical gray matter of the parasagittal left frontal lobe (series 5, image 93) and left parietal lobe (series 5, images 86, 87, likely reflecting punctate ischemic infarcts. No frank intraparenchymal hematoma or mass effect. No other diffusion abnormality to suggest acute or subacute ischemia. Gray-white matter differentiation otherwise maintained. No other areas of remote cortical infarction. No other evidence for acute or chronic intracranial hemorrhage. No mass lesion or midline shift. No hydrocephalus or extra-axial collection. Midline structures intact. Pituitary gland suprasellar region normal. Vascular: Susceptibility artifact related to coiled left MCA bifurcation aneurysm. Major intracranial vascular flow voids maintained elsewhere. Skull and upper cervical spine: Craniocervical junction within normal limits. Upper cervical spine normal. Bone  marrow signal intensity within normal limits. No scalp soft tissue abnormality. Sinuses/Orbits: Globes and orbital soft tissues within normal limits. Mild scattered mucosal thickening noted within the  ethmoidal air cells. Paranasal sinuses are otherwise largely clear. Trace right mastoid effusion. Inner ear structures grossly normal. Other: None. IMPRESSION: 1. Scattered subarachnoid hemorrhage involving the left sylvian fissure and overlying left frontal parietal convexity, relatively stable from prior CT, and consistent with recently ruptured left MCA bifurcation aneurysm. 2. Few probable punctate ischemic nonhemorrhagic infarcts involving the left frontal and parietal lobes as above. Additional scattered serpiginous diffusion abnormality favored to in large part be related to the underlying subarachnoid hemorrhage. 3. Sequelae of interval coil embolization of left MCA bifurcation aneurysm. Electronically Signed   By: Jeannine Boga M.D.   On: 08/07/2019 02:21   IR Transcath/Emboliz  Result Date: 08/07/2019 INDICATION: 64 year old male presenting to the emergency with 1 day history of intermittent dysphasia and right hand numbness and weakness. Head CT showed a left sylvian subarachnoid hemorrhage (mFS 1). 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 He had been started on dual anti-platelet therapy with aspirin and Brilinta 5 days ago in anticipation to schedule procedure on 08/07/2019. At admission, he presented mild expressive aphasia with no motor deficit. He was then taken to our service for emergency angiogram and left MCA aneurysm treatment. EXAM: Ultrasound-guided vascular access Diagnostic cerebral angiogram 3D rotational angiogram Balloon assisted coil embolization of brain aneurysm Flat panel CT COMPARISON:  Diagnostic cerebral angiogram July 19, 2019 MEDICATIONS: No antibiotics utilized during the procedure. A total of 5000  units of heparin IV administered. ANESTHESIA/SEDATION: The procedure was performed under general anesthesia. CONTRAST:  140 cc of Omnipaque 240 FLUOROSCOPY TIME:  Fluoroscopy Time: 72 minutes 18 seconds (3059 mGy). COMPLICATIONS: None immediate. TECHNIQUE: Informed written consent was obtained from the patient after a thorough discussion of the procedural risks, benefits and alternatives. All questions were addressed. Maximal Sterile Barrier Technique was utilized including caps, mask, sterile gowns, sterile gloves, sterile drape, hand hygiene and skin antiseptic. A timeout was performed prior to the initiation of the procedure. Real-time ultrasound guidance was utilized for vascular access including the acquisition of a permanent ultrasound image documenting patency of the accessed vessel. Using a micropuncture kit and the modified Seldinger technique, access was gained to the right common femoral artery and an 8 French sheath was placed. Then, a 5 [place] DAV catheter was navigated over a 0.035 inch Terumo Glidewire into the aortic arch under fluoroscopy. The catheter was then placed into the left common carotid artery. Frontal and lateral road map was obtained. The catheter was then navigated into the left internal artery. Frontal and lateral angiograms of the head were. 3D rotational angiograms were acquired and post processed in a separate workstation under concurrent attending physician supervision. Selected images were sent to PACS. Magnified frontal and lateral angiograms of the head in working projections were obtained. 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 PROCEDURE: The DAV catheter was exchanged over the wire and under biplane roadmap for a cerabase guide catheter which was placed in the cervical left ICA. A 6 [place] intermediate catheter was then navigated over the [employer] into  the petrous segment of the left ICA. Magnified  frontal and lateral angiograms of the head were obtained in the working projections. Using biplane roadmap, a headway duo microcatheter was navigated over a synchro 2 micro guidewire into the left MCA bifurcation aneurysm pouch. Attempted primary coiling proved unsuccessful due to wide neck. A 3 x 15 mm Transform compliant balloon was navigated over a synchro 14 micro guidewire in a parallel fashion through the [place] intermediate catheter into the left MCA M2 inferior division branch with the balloon at the level of the aneurysm neck. Then, a 4 mm x 6 cm target 360 soft framing coil was placed within the aneurysm pouch while the transform balloon was inflated. The balloon was then deflated. The coil mass remained stable within the aneurysm pouch. Control angiogram showed maintained patency of the daughter vessels. The coil was then detached. In a similar fashion, 3 filling and 2 finishing coils were placed within the aneurysm pouch (3 mm x 6 cm target 360 ultra, 3 mm x 4 cm target 360 ultra, 2.5 mm x 4 cm target 360 Nano, 1.5 mm by 4 cm target 360 nano and 1.5 mm x 4 cm target 360 nano). Control angiograms were obtained. The microcatheter was then removed followed by removal of the transform balloon. Follow-up magnified frontal and lateral angiograms of the head in the working projections were obtained showing adequate aneurysm occlusion with good coil packing density and preservation of the parent artery and daughter arteries. Delayed magnified frontal and lateral angiograms of the head in the working projections were obtained showing stable coil mass and no evidence of clot formation on the coil mass surface. Frontal and lateral angiograms with views of the entire head were obtained. No evidence of thromboembolic complication noted. The catheter construct was subsequently withdrawn. Flat panel CT of the head was obtained and post processed in a separate workstation with concurrent attending physician supervision.  Selected images were sent to PACS. Stable appearance of known is left sided subarachnoid hemorrhage with no evidence of intra procedural bleed. A right common femoral artery angiogram with right anterior oblique view was obtained. Mild atherosclerotic changes of the right common femoral artery are noted, without stenosis. The access is at the level of the common femoral artery which has appropriate caliber for closure device utilization. The 8 French sheath was removed over the wire and a Perclose ProGlide closure device was used for access closure. Immediate hemostasis was achieved. 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 PLAN: 1. Patient transferred to ICU for anesthesia recovery and vasospasm watch. 2. Brilinta can be discontinued while remaining on aspirin 81 mg. Electronically Signed   By: Pedro Earls M.D.   On: 08/07/2019 13:43   IR Angiogram Follow Up Study  Result Date: 08/07/2019 INDICATION: 64 year old male presenting to the emergency with 1 day history of intermittent dysphasia and right hand numbness and weakness. Head CT showed a left sylvian subarachnoid hemorrhage (mFS 1). 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 He had been started on dual anti-platelet therapy with aspirin and Brilinta 5 days ago in anticipation to schedule procedure on 08/07/2019. At admission, he presented mild expressive aphasia with  no motor deficit. He was then taken to our service for emergency angiogram and left MCA aneurysm treatment. EXAM: Ultrasound-guided vascular access Diagnostic cerebral angiogram 3D rotational angiogram Balloon assisted coil  embolization of brain aneurysm Flat panel CT COMPARISON:  Diagnostic cerebral angiogram July 19, 2019 MEDICATIONS: No antibiotics utilized during the procedure. A total of 5000 units of heparin IV administered. ANESTHESIA/SEDATION: The procedure was performed under general anesthesia. CONTRAST:  140 cc of Omnipaque 240 FLUOROSCOPY TIME:  Fluoroscopy Time: 72 minutes 18 seconds (3059 mGy). COMPLICATIONS: None immediate. TECHNIQUE: Informed written consent was obtained from the patient after a thorough discussion of the procedural risks, benefits and alternatives. All questions were addressed. Maximal Sterile Barrier Technique was utilized including caps, mask, sterile gowns, sterile gloves, sterile drape, hand hygiene and skin antiseptic. A timeout was performed prior to the initiation of the procedure. Real-time ultrasound guidance was utilized for vascular access including the acquisition of a permanent ultrasound image documenting patency of the accessed vessel. Using a micropuncture kit and the modified Seldinger technique, access was gained to the right common femoral artery and an 8 French sheath was placed. Then, a 5 [place] DAV catheter was navigated over a 0.035 inch Terumo Glidewire into the aortic arch under fluoroscopy. The catheter was then placed into the left common carotid artery. Frontal and lateral road map was obtained. The catheter was then navigated into the left internal artery. Frontal and lateral angiograms of the head were. 3D rotational angiograms were acquired and post processed in a separate workstation under concurrent attending physician supervision. Selected images were sent to PACS. Magnified frontal and lateral angiograms of the head in working projections were obtained. 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 PROCEDURE: The DAV catheter was exchanged over the wire and under biplane roadmap for a cerabase guide catheter  which was placed in the cervical left ICA. A 6 [place] intermediate catheter was then navigated over the [employer] into the petrous segment of the left ICA. Magnified frontal and lateral angiograms of the head were obtained in the working projections. Using biplane roadmap, a headway duo microcatheter was navigated over a synchro 2 micro guidewire into the left MCA bifurcation aneurysm pouch. Attempted primary coiling proved unsuccessful due to wide neck. A 3 x 15 mm Transform compliant balloon was navigated over a synchro 14 micro guidewire in a parallel fashion through the [place] intermediate catheter into the left MCA M2 inferior division branch with the balloon at the level of the aneurysm neck. Then, a 4 mm x 6 cm target 360 soft framing coil was placed within the aneurysm pouch while the transform balloon was inflated. The balloon was then deflated. The coil mass remained stable within the aneurysm pouch. Control angiogram showed maintained patency of the daughter vessels. The coil was then detached. In a similar fashion, 3 filling and 2 finishing coils were placed within the aneurysm pouch (3 mm x 6 cm target 360 ultra, 3 mm x 4 cm target 360 ultra, 2.5 mm x 4 cm target 360 Nano, 1.5 mm by 4 cm target 360 nano and 1.5 mm x 4 cm target 360 nano). Control angiograms were obtained. The microcatheter was then removed followed by removal of the transform balloon. Follow-up magnified frontal and lateral angiograms of the head in the working projections were obtained showing adequate aneurysm occlusion with good coil packing density and preservation of the parent artery and daughter arteries. Delayed magnified  frontal and lateral angiograms of the head in the working projections were obtained showing stable coil mass and no evidence of clot formation on the coil mass surface. Frontal and lateral angiograms with views of the entire head were obtained. No evidence of thromboembolic complication noted. The  catheter construct was subsequently withdrawn. Flat panel CT of the head was obtained and post processed in a separate workstation with concurrent attending physician supervision. Selected images were sent to PACS. Stable appearance of known is left sided subarachnoid hemorrhage with no evidence of intra procedural bleed. A right common femoral artery angiogram with right anterior oblique view was obtained. Mild atherosclerotic changes of the right common femoral artery are noted, without stenosis. The access is at the level of the common femoral artery which has appropriate caliber for closure device utilization. The 8 French sheath was removed over the wire and a Perclose ProGlide closure device was used for access closure. Immediate hemostasis was achieved. 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 PLAN: 1. Patient transferred to ICU for anesthesia recovery and vasospasm watch. 2. Brilinta can be discontinued while remaining on aspirin 81 mg. Electronically Signed   By: Pedro Earls M.D.   On: 08/07/2019 13:43   IR Angiogram Follow Up Study  Result Date: 08/07/2019 INDICATION: 64 year old male presenting to the emergency with 1 day history of intermittent dysphasia and right hand numbness and weakness. Head CT showed a left sylvian subarachnoid hemorrhage (mFS 1). 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 He had been started on dual anti-platelet therapy with aspirin and Brilinta 5 days ago in anticipation to schedule procedure on 08/07/2019. At admission, he presented mild expressive aphasia with no motor deficit. He was then taken to our  service for emergency angiogram and left MCA aneurysm treatment. EXAM: Ultrasound-guided vascular access Diagnostic cerebral angiogram 3D rotational angiogram Balloon assisted coil embolization of brain aneurysm Flat panel CT COMPARISON:  Diagnostic cerebral angiogram July 19, 2019 MEDICATIONS: No antibiotics utilized during the procedure. A total of 5000 units of heparin IV administered. ANESTHESIA/SEDATION: The procedure was performed under general anesthesia. CONTRAST:  140 cc of Omnipaque 240 FLUOROSCOPY TIME:  Fluoroscopy Time: 72 minutes 18 seconds (3059 mGy). COMPLICATIONS: None immediate. TECHNIQUE: Informed written consent was obtained from the patient after a thorough discussion of the procedural risks, benefits and alternatives. All questions were addressed. Maximal Sterile Barrier Technique was utilized including caps, mask, sterile gowns, sterile gloves, sterile drape, hand hygiene and skin antiseptic. A timeout was performed prior to the initiation of the procedure. Real-time ultrasound guidance was utilized for vascular access including the acquisition of a permanent ultrasound image documenting patency of the accessed vessel. Using a micropuncture kit and the modified Seldinger technique, access was gained to the right common femoral artery and an 8 French sheath was placed. Then, a 5 [place] DAV catheter was navigated over a 0.035 inch Terumo Glidewire into the aortic arch under fluoroscopy. The catheter was then placed into the left common carotid artery. Frontal and lateral road map was obtained. The catheter was then navigated into the left internal artery. Frontal and lateral angiograms of the head were. 3D rotational angiograms were acquired and  post processed in a separate workstation under concurrent attending physician supervision. Selected images were sent to PACS. Magnified frontal and lateral angiograms of the head in working projections were obtained. FINDINGS: Wide neck left MCA  bifurcation aneurysm measuring 4.5 x 3.5 mm with the neck measuring 3.4 mm. PROCEDURE: The DAV catheter was exchanged over the wire and under biplane roadmap for a cerabase guide catheter which was placed in the cervical left ICA. A 6 [place] intermediate catheter was then navigated over the [employer] into the petrous segment of the left ICA. Magnified frontal and lateral angiograms of the head were obtained in the working projections. Using biplane roadmap, a headway duo microcatheter was navigated over a synchro 2 micro guidewire into the left MCA bifurcation aneurysm pouch. Attempted primary coiling proved unsuccessful due to wide neck. A 3 x 15 mm Transform compliant balloon was navigated over a synchro 14 micro guidewire in a parallel fashion through the [place] intermediate catheter into the left MCA M2 inferior division branch with the balloon at the level of the aneurysm neck. Then, a 4 mm x 6 cm target 360 soft framing coil was placed within the aneurysm pouch while the transform balloon was inflated. The balloon was then deflated. The coil mass remained stable within the aneurysm pouch. Control angiogram showed maintained patency of the daughter vessels. The coil was then detached. In a similar fashion, 3 filling and 2 finishing coils were placed within the aneurysm pouch (3 mm x 6 cm target 360 ultra, 3 mm x 4 cm target 360 ultra, 2.5 mm x 4 cm target 360 Nano, 1.5 mm by 4 cm target 360 nano and 1.5 mm x 4 cm target 360 nano). Control angiograms were obtained. The microcatheter was then removed followed by removal of the transform balloon. Follow-up magnified frontal and lateral angiograms of the head in the working projections were obtained showing adequate aneurysm occlusion with good coil packing density and preservation of the parent artery and daughter arteries. Delayed magnified frontal and lateral angiograms of the head in the working projections were obtained showing stable coil mass and  no evidence of clot formation on the coil mass surface. Frontal and lateral angiograms with views of the entire head were obtained. No evidence of thromboembolic complication noted. The catheter construct was subsequently withdrawn. Flat panel CT of the head was obtained and post processed in a separate workstation with concurrent attending physician supervision. Selected images were sent to PACS. Stable appearance of known is left sided subarachnoid hemorrhage with no evidence of intra procedural bleed. A right common femoral artery angiogram with right anterior oblique view was obtained. Mild atherosclerotic changes of the right common femoral artery are noted, without stenosis. The access is at the level of the common femoral artery which has appropriate caliber for closure device utilization. The 8 French sheath was removed over the wire and a Perclose ProGlide closure device was used for access closure. Immediate hemostasis was achieved. 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 PLAN: 1. Patient transferred to ICU for anesthesia recovery and vasospasm watch. 2. Brilinta can be discontinued while remaining on aspirin 81 mg. Electronically Signed   By: Pedro Earls M.D.   On: 08/07/2019 13:43   IR Angiogram Follow Up Study  Result Date: 08/07/2019 INDICATION: 64 year old male presenting to the emergency with 1 day history of intermittent dysphasia and right hand numbness and weakness. Head CT showed a  left sylvian subarachnoid hemorrhage (mFS 1). 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 He had been started on dual anti-platelet therapy  with aspirin and Brilinta 5 days ago in anticipation to schedule procedure on 08/07/2019. At admission, he presented mild expressive aphasia with no motor deficit. He was then taken to our service for emergency angiogram and left MCA aneurysm treatment. EXAM: Ultrasound-guided vascular access Diagnostic cerebral angiogram 3D rotational angiogram Balloon assisted coil embolization of brain aneurysm Flat panel CT COMPARISON:  Diagnostic cerebral angiogram July 19, 2019 MEDICATIONS: No antibiotics utilized during the procedure. A total of 5000 units of heparin IV administered. ANESTHESIA/SEDATION: The procedure was performed under general anesthesia. CONTRAST:  140 cc of Omnipaque 240 FLUOROSCOPY TIME:  Fluoroscopy Time: 72 minutes 18 seconds (3059 mGy). COMPLICATIONS: None immediate. TECHNIQUE: Informed written consent was obtained from the patient after a thorough discussion of the procedural risks, benefits and alternatives. All questions were addressed. Maximal Sterile Barrier Technique was utilized including caps, mask, sterile gowns, sterile gloves, sterile drape, hand hygiene and skin antiseptic. A timeout was performed prior to the initiation of the procedure. Real-time ultrasound guidance was utilized for vascular access including the acquisition of a permanent ultrasound image documenting patency of the accessed vessel. Using a micropuncture kit and the modified Seldinger technique, access was gained to the right common femoral artery and an 8 French sheath was placed. Then, a 5 [place] DAV catheter was navigated over a 0.035 inch Terumo Glidewire into the aortic arch under fluoroscopy. The catheter was then placed into the left common carotid artery. Frontal and lateral road map was obtained. The catheter was then navigated into the left internal artery. Frontal and lateral angiograms of the head were. 3D rotational angiograms were acquired and post processed in a separate workstation under concurrent  attending physician supervision. Selected images were sent to PACS. Magnified frontal and lateral angiograms of the head in working projections were obtained. 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 PROCEDURE: The DAV catheter was exchanged over the wire and under biplane roadmap for a cerabase guide catheter which was placed in the cervical left ICA. A 6 [place] intermediate catheter was then navigated over the [employer] into the petrous segment of the left ICA. Magnified frontal and lateral angiograms of the head were obtained in the working projections. Using biplane roadmap, a headway duo microcatheter was navigated over a synchro 2 micro guidewire into the left MCA bifurcation aneurysm pouch. Attempted primary coiling proved unsuccessful due to wide neck. A 3 x 15 mm Transform compliant balloon was navigated over a synchro 14 micro guidewire in a parallel fashion through the [place] intermediate catheter into the left MCA M2 inferior division branch with the balloon at the level of the aneurysm neck. Then, a 4 mm x 6 cm target 360 soft framing coil was placed within the aneurysm pouch while the transform balloon was inflated. The balloon was then deflated. The coil mass remained stable within the aneurysm pouch. Control angiogram showed maintained patency of the daughter vessels. The coil was then detached. In a similar fashion, 3 filling and 2 finishing coils were placed within the aneurysm pouch (3 mm x 6  cm target 360 ultra, 3 mm x 4 cm target 360 ultra, 2.5 mm x 4 cm target 360 Nano, 1.5 mm by 4 cm target 360 nano and 1.5 mm x 4 cm target 360 nano). Control angiograms were obtained. The microcatheter was then removed followed by removal of the transform balloon. Follow-up magnified frontal and lateral angiograms of the head in the working projections were obtained showing adequate aneurysm occlusion with good coil packing density and  preservation of the parent artery and daughter arteries. Delayed magnified frontal and lateral angiograms of the head in the working projections were obtained showing stable coil mass and no evidence of clot formation on the coil mass surface. Frontal and lateral angiograms with views of the entire head were obtained. No evidence of thromboembolic complication noted. The catheter construct was subsequently withdrawn. Flat panel CT of the head was obtained and post processed in a separate workstation with concurrent attending physician supervision. Selected images were sent to PACS. Stable appearance of known is left sided subarachnoid hemorrhage with no evidence of intra procedural bleed. A right common femoral artery angiogram with right anterior oblique view was obtained. Mild atherosclerotic changes of the right common femoral artery are noted, without stenosis. The access is at the level of the common femoral artery which has appropriate caliber for closure device utilization. The 8 French sheath was removed over the wire and a Perclose ProGlide closure device was used for access closure. Immediate hemostasis was achieved. 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 PLAN: 1. Patient transferred to ICU for anesthesia recovery and vasospasm watch. 2. Brilinta can be discontinued while remaining on aspirin 81 mg. Electronically Signed   By: Pedro Earls M.D.   On: 08/07/2019 13:43   IR Angiogram Follow Up Study  Result Date: 08/07/2019 INDICATION: 64 year old male presenting to the emergency with 1 day history of intermittent dysphasia and right hand numbness and weakness. Head CT showed a left sylvian subarachnoid hemorrhage (mFS 1). 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 He had been started on dual anti-platelet therapy with aspirin and Brilinta 5 days ago in anticipation to schedule procedure on 08/07/2019. At admission, he presented mild expressive aphasia with no motor deficit. He was then taken to our service for emergency angiogram and left MCA aneurysm treatment. EXAM: Ultrasound-guided vascular access Diagnostic cerebral angiogram 3D rotational angiogram Balloon assisted coil embolization of brain aneurysm Flat panel CT COMPARISON:  Diagnostic cerebral angiogram July 19, 2019 MEDICATIONS: No antibiotics utilized during the procedure. A total of 5000 units of heparin IV administered. ANESTHESIA/SEDATION: The procedure was performed under general anesthesia. CONTRAST:  140 cc of Omnipaque 240 FLUOROSCOPY TIME:  Fluoroscopy Time: 72 minutes 18 seconds (3059 mGy). COMPLICATIONS: None immediate. TECHNIQUE: Informed written consent was obtained from the patient after a thorough discussion of the procedural risks, benefits and alternatives. All questions were addressed. Maximal Sterile Barrier Technique was utilized including caps, mask, sterile gowns, sterile gloves, sterile drape, hand hygiene and skin antiseptic. A timeout was performed prior to the initiation of the procedure. Real-time ultrasound guidance was utilized for vascular access including the acquisition of a permanent ultrasound image documenting patency of the accessed  vessel. Using a micropuncture kit and the modified Seldinger technique, access was gained to the right common femoral artery and an 8 French sheath was placed. Then, a 5 [place] DAV catheter was navigated over a 0.035 inch Terumo Glidewire into the aortic arch under fluoroscopy. The catheter was then placed into the left common carotid artery. Frontal and lateral road map was obtained. The catheter was then navigated  into the left internal artery. Frontal and lateral angiograms of the head were. 3D rotational angiograms were acquired and post processed in a separate workstation under concurrent attending physician supervision. Selected images were sent to PACS. Magnified frontal and lateral angiograms of the head in working projections were obtained. 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 PROCEDURE: The DAV catheter was exchanged over the wire and under biplane roadmap for a cerabase guide catheter which was placed in the cervical left ICA. A 6 [place] intermediate catheter was then navigated over the [employer] into the petrous segment of the left ICA. Magnified frontal and lateral angiograms of the head were obtained in the working projections. Using biplane roadmap, a headway duo microcatheter was navigated over a synchro 2 micro guidewire into the left MCA bifurcation aneurysm pouch. Attempted primary coiling proved unsuccessful due to wide neck. A 3 x 15 mm Transform compliant balloon was navigated over a synchro 14 micro guidewire in a parallel fashion through the [place] intermediate catheter into the left MCA M2 inferior division branch with the balloon at the level of the aneurysm neck. Then, a 4 mm x 6 cm target 360 soft framing coil was placed within the aneurysm pouch while the transform balloon was inflated. The balloon was then deflated. The coil mass remained stable within the aneurysm pouch. Control angiogram showed maintained patency of the daughter vessels. The coil was then detached. In a similar fashion, 3 filling and 2 finishing coils were placed within the aneurysm pouch (3 mm x 6 cm target 360 ultra, 3 mm x 4 cm target 360 ultra, 2.5 mm x 4 cm target 360 Nano, 1.5 mm by 4 cm target 360 nano and 1.5 mm x 4 cm target 360 nano). Control angiograms were obtained. The microcatheter was then removed followed by removal of the transform  balloon. Follow-up magnified frontal and lateral angiograms of the head in the working projections were obtained showing adequate aneurysm occlusion with good coil packing density and preservation of the parent artery and daughter arteries. Delayed magnified frontal and lateral angiograms of the head in the working projections were obtained showing stable coil mass and no evidence of clot formation on the coil mass surface. Frontal and lateral angiograms with views of the entire head were obtained. No evidence of thromboembolic complication noted. The catheter construct was subsequently withdrawn. Flat panel CT of the head was obtained and post processed in a separate workstation with concurrent attending physician supervision. Selected images were sent to PACS. Stable appearance of known is left sided subarachnoid hemorrhage with no evidence of intra procedural bleed. A right common femoral artery angiogram with right anterior oblique view was obtained. Mild atherosclerotic changes of the right common femoral artery are noted, without stenosis. The access is at the level of the common femoral artery which has appropriate caliber for closure device utilization. The 8 French sheath was removed over the wire and a Perclose ProGlide closure device was used for access closure. Immediate hemostasis was achieved. 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 PLAN: 1. Patient transferred to ICU for anesthesia recovery and vasospasm watch. 2. Brilinta can be discontinued while remaining on aspirin 81 mg. Electronically Signed   By: Pedro Earls M.D.   On: 08/07/2019 13:43   IR Angiogram Follow Up Study  Result Date: 08/07/2019 INDICATION: 64 year old male presenting to the emergency with 1 day history of intermittent dysphasia and right hand numbness and weakness. Head CT showed a left sylvian subarachnoid hemorrhage (mFS  1). 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 He had been started on dual anti-platelet therapy with aspirin and Brilinta 5 days ago in anticipation to schedule procedure on 08/07/2019. At admission, he presented mild expressive aphasia with no motor deficit. He was then taken to our service for emergency angiogram and left MCA aneurysm treatment. EXAM: Ultrasound-guided vascular access Diagnostic cerebral angiogram 3D rotational angiogram Balloon assisted coil embolization of brain aneurysm Flat panel CT COMPARISON:  Diagnostic cerebral angiogram July 19, 2019 MEDICATIONS: No antibiotics utilized during the procedure. A total of 5000 units of heparin IV administered. ANESTHESIA/SEDATION: The procedure was performed under general anesthesia. CONTRAST:  140 cc of Omnipaque 240 FLUOROSCOPY TIME:  Fluoroscopy Time: 72 minutes 18 seconds (3059 mGy). COMPLICATIONS: None immediate. TECHNIQUE: Informed written consent was obtained from the patient after a thorough discussion of the procedural risks, benefits and alternatives. All questions were addressed. Maximal Sterile Barrier Technique was utilized including caps, mask, sterile gowns, sterile gloves, sterile drape, hand hygiene and skin antiseptic. A timeout was performed prior to the initiation of the procedure. Real-time ultrasound guidance was utilized for vascular access including the acquisition of a permanent ultrasound image documenting patency of the accessed vessel. Using a micropuncture kit and the modified Seldinger technique, access was gained to the right common femoral artery and an 8 French sheath was placed. Then, a 5 [place] DAV catheter was navigated over a 0.035 inch Terumo  Glidewire into the aortic arch under fluoroscopy. The catheter was then placed into the left common carotid artery. Frontal and lateral road map was obtained. The catheter was then navigated into the left internal artery. Frontal and lateral angiograms of the head were. 3D rotational angiograms were acquired and post processed in a separate workstation under concurrent attending physician supervision. Selected images were sent to PACS. Magnified frontal and lateral angiograms of the head in working projections were obtained. 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 PROCEDURE: The DAV catheter was exchanged over the wire and under biplane roadmap for a cerabase guide catheter which was placed in the cervical left ICA. A 6 [place] intermediate catheter was then navigated over the [employer] into the petrous segment of the left ICA. Magnified frontal and lateral angiograms of the head were obtained in the working projections. Using biplane roadmap, a headway duo microcatheter was navigated over a synchro 2 micro guidewire into the left MCA bifurcation aneurysm pouch. Attempted primary coiling proved unsuccessful due to wide neck. A 3 x 15 mm Transform compliant balloon was navigated over a synchro 14 micro guidewire in a parallel fashion through the [place] intermediate catheter into the  left MCA M2 inferior division branch with the balloon at the level of the aneurysm neck. Then, a 4 mm x 6 cm target 360 soft framing coil was placed within the aneurysm pouch while the transform balloon was inflated. The balloon was then deflated. The coil mass remained stable within the aneurysm pouch. Control angiogram showed maintained patency of the daughter vessels. The coil was then detached. In a similar fashion, 3 filling and 2 finishing coils were placed within the aneurysm pouch (3 mm x 6 cm target 360 ultra, 3 mm x 4 cm target 360 ultra, 2.5 mm x 4 cm  target 360 Nano, 1.5 mm by 4 cm target 360 nano and 1.5 mm x 4 cm target 360 nano). Control angiograms were obtained. The microcatheter was then removed followed by removal of the transform balloon. Follow-up magnified frontal and lateral angiograms of the head in the working projections were obtained showing adequate aneurysm occlusion with good coil packing density and preservation of the parent artery and daughter arteries. Delayed magnified frontal and lateral angiograms of the head in the working projections were obtained showing stable coil mass and no evidence of clot formation on the coil mass surface. Frontal and lateral angiograms with views of the entire head were obtained. No evidence of thromboembolic complication noted. The catheter construct was subsequently withdrawn. Flat panel CT of the head was obtained and post processed in a separate workstation with concurrent attending physician supervision. Selected images were sent to PACS. Stable appearance of known is left sided subarachnoid hemorrhage with no evidence of intra procedural bleed. A right common femoral artery angiogram with right anterior oblique view was obtained. Mild atherosclerotic changes of the right common femoral artery are noted, without stenosis. The access is at the level of the common femoral artery which has appropriate caliber for closure device utilization. The 8 French sheath was removed over the wire and a Perclose ProGlide closure device was used for access closure. Immediate hemostasis was achieved. 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 PLAN: 1. Patient transferred to ICU for anesthesia recovery and vasospasm watch. 2. Brilinta can be discontinued while remaining on aspirin 81 mg. Electronically Signed   By: Pedro Earls M.D.   On: 08/07/2019 13:43   IR Angiogram Follow Up Study  Result Date:  08/07/2019 INDICATION: 64 year old male presenting to the emergency with 1 day history of intermittent dysphasia and right hand numbness and weakness. Head CT showed a left sylvian subarachnoid hemorrhage (mFS 1). 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 He had been started on dual anti-platelet therapy with aspirin and Brilinta 5 days ago in anticipation to schedule procedure on 08/07/2019. At admission, he presented mild expressive aphasia with no motor deficit. He was then taken to our service for emergency angiogram and left MCA aneurysm treatment. EXAM: Ultrasound-guided vascular access Diagnostic cerebral angiogram 3D rotational angiogram Balloon assisted coil embolization of brain aneurysm Flat panel CT COMPARISON:  Diagnostic cerebral angiogram July 19, 2019 MEDICATIONS: No antibiotics utilized during the procedure. A total of 5000 units of heparin IV administered. ANESTHESIA/SEDATION: The procedure was performed under general anesthesia. CONTRAST:  140 cc of Omnipaque 240 FLUOROSCOPY TIME:  Fluoroscopy Time: 72 minutes 18 seconds (3059 mGy). COMPLICATIONS: None immediate. TECHNIQUE: Informed written consent was obtained from the patient after a thorough discussion of the procedural risks, benefits and alternatives. All questions were addressed. Maximal Sterile Barrier Technique was utilized including caps, mask, sterile gowns, sterile gloves, sterile drape, hand hygiene and skin antiseptic. A timeout was performed prior to the initiation of the procedure. Real-time ultrasound guidance was utilized for vascular access including the acquisition of a permanent ultrasound image documenting patency of the accessed vessel. Using a  micropuncture kit and the modified Seldinger technique, access was gained to the right common femoral artery and an 8 French sheath was placed. Then, a 5 [place] DAV catheter was navigated over a 0.035 inch Terumo Glidewire into the aortic arch under fluoroscopy. The catheter was then placed into the left common carotid artery. Frontal and lateral road map was obtained. The catheter was then navigated into the left internal artery. Frontal and lateral angiograms of the head were. 3D rotational angiograms were acquired and post processed in a separate workstation under concurrent attending physician supervision. Selected images were sent to PACS. Magnified frontal and lateral angiograms of the head in working projections were obtained. 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 PROCEDURE: The DAV catheter was exchanged over the wire and under biplane roadmap for a cerabase guide catheter which was placed in the cervical left ICA. A 6 [place] intermediate catheter was then navigated over the [employer] into the petrous segment of the left ICA. Magnified frontal and lateral angiograms of the head were obtained in the working projections. Using biplane roadmap, a headway duo microcatheter was navigated over a synchro 2 micro guidewire into the left MCA bifurcation aneurysm pouch. Attempted primary coiling proved unsuccessful due to wide neck. A 3 x 15 mm Transform compliant balloon was navigated over a synchro 14 micro guidewire in a parallel fashion through the [place] intermediate catheter into the left MCA M2 inferior division branch with the balloon at the level of the aneurysm neck. Then, a 4 mm x 6 cm target 360 soft framing coil was placed within the aneurysm pouch while the transform balloon was inflated. The balloon was then deflated. The coil mass remained stable within the aneurysm pouch. Control angiogram showed maintained patency of the  daughter vessels. The coil was then detached. In a similar fashion, 3 filling and 2 finishing coils were placed within the aneurysm pouch (3 mm x 6 cm target 360 ultra, 3 mm x 4 cm target 360 ultra, 2.5 mm x 4 cm target 360 Nano, 1.5 mm by 4 cm target 360 nano and 1.5 mm x 4 cm target 360 nano). Control angiograms were obtained. The microcatheter was then removed followed by removal of the transform balloon. Follow-up magnified frontal and lateral angiograms of the head in the working projections were obtained showing adequate aneurysm occlusion with good coil packing density and preservation of the parent artery and daughter arteries. Delayed magnified frontal and lateral angiograms of the head in the working projections were obtained showing stable coil mass and no evidence of clot formation on the coil mass surface. Frontal and lateral angiograms with views of the entire head were obtained. No evidence of thromboembolic complication noted. The catheter construct was subsequently withdrawn. Flat panel CT of the head was obtained and post processed in a separate workstation with concurrent attending physician supervision. Selected images were sent to PACS. Stable appearance  of known is left sided subarachnoid hemorrhage with no evidence of intra procedural bleed. A right common femoral artery angiogram with right anterior oblique view was obtained. Mild atherosclerotic changes of the right common femoral artery are noted, without stenosis. The access is at the level of the common femoral artery which has appropriate caliber for closure device utilization. The 8 [place] sheath was removed over the wire and a Perclose ProGlide closure device was used for access closure. Immediate hemostasis was achieved. 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 PLAN: 1. Patient transferred to ICU for anesthesia recovery and vasospasm watch. 2.  Brilinta can be discontinued while remaining on aspirin 81 mg. Electronically Signed   By: Pedro Earls M.D.   On: 08/07/2019 13:43   IR 3D Independent Darreld Mclean  Result Date: 08/07/2019 INDICATION: 64 year old male presenting to the emergency with 1 day history of intermittent dysphasia and right hand numbness and weakness. Head CT showed a left sylvian subarachnoid hemorrhage (mFS 1). 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 He had been started on dual anti-platelet therapy with aspirin and Brilinta 5 days ago in anticipation to schedule procedure on 08/07/2019. At admission, he presented mild expressive aphasia with no motor deficit. He was then taken to our service for emergency angiogram and left MCA aneurysm treatment. EXAM: Ultrasound-guided vascular access Diagnostic cerebral angiogram 3D rotational angiogram Balloon assisted coil embolization of brain aneurysm Flat panel CT COMPARISON:  Diagnostic cerebral angiogram July 19, 2019 MEDICATIONS: No antibiotics utilized during the procedure. A total of 5000 units of heparin IV administered. ANESTHESIA/SEDATION: The procedure was performed under general anesthesia. CONTRAST:  140 cc of Omnipaque 240 FLUOROSCOPY TIME:  Fluoroscopy Time: 72 minutes 18 seconds (3059 mGy). COMPLICATIONS: None immediate. TECHNIQUE: Informed written consent was obtained from the patient after a thorough discussion of the procedural risks, benefits and alternatives. All questions were addressed. Maximal Sterile Barrier Technique was utilized including caps, mask, sterile gowns, sterile gloves, sterile drape, hand hygiene and skin antiseptic. A timeout was performed prior to the initiation of the  procedure. Real-time ultrasound guidance was utilized for vascular access including the acquisition of a permanent ultrasound image documenting patency of the accessed vessel. Using a micropuncture kit and the modified Seldinger technique, access was gained to the right common femoral artery and an 8 French sheath was placed. Then, a 5 [place] DAV catheter was navigated over a 0.035 inch Terumo Glidewire into the aortic arch under fluoroscopy. The catheter was then placed into the left common carotid artery. Frontal and lateral road map was obtained. The catheter was then navigated into the left internal artery. Frontal and lateral angiograms of the head were. 3D rotational angiograms were acquired and post processed in a separate workstation under concurrent attending physician supervision. Selected images were sent to PACS. Magnified frontal and lateral angiograms of the head in working projections were obtained. 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 PROCEDURE: The DAV catheter was exchanged over the wire and under biplane roadmap for a cerabase guide catheter which was placed in the cervical left ICA. A 6 [place] intermediate catheter was  then navigated over the Terumo Glidewire into the petrous segment of the left ICA. Magnified frontal and lateral angiograms of the head were obtained in the working projections. Using biplane roadmap, a headway duo microcatheter was navigated over a synchro 2 micro guidewire into the left MCA bifurcation aneurysm pouch. Attempted primary coiling proved unsuccessful due to wide neck. A 3 x 15 mm Transform compliant balloon was navigated over a synchro 14 micro guidewire in a parallel fashion through the [place] intermediate catheter into the left MCA M2 inferior division branch with the balloon at the level of the aneurysm neck. Then, a 4 mm x 6 cm target 360 soft framing coil was placed within the aneurysm  pouch while the transform balloon was inflated. The balloon was then deflated. The coil mass remained stable within the aneurysm pouch. Control angiogram showed maintained patency of the daughter vessels. The coil was then detached. In a similar fashion, 3 filling and 2 finishing coils were placed within the aneurysm pouch (3 mm x 6 cm target 360 ultra, 3 mm x 4 cm target 360 ultra, 2.5 mm x 4 cm target 360 Nano, 1.5 mm by 4 cm target 360 nano and 1.5 mm x 4 cm target 360 nano). Control angiograms were obtained. The microcatheter was then removed followed by removal of the transform balloon. Follow-up magnified frontal and lateral angiograms of the head in the working projections were obtained showing adequate aneurysm occlusion with good coil packing density and preservation of the parent artery and daughter arteries. Delayed magnified frontal and lateral angiograms of the head in the working projections were obtained showing stable coil mass and no evidence of clot formation on the coil mass surface. Frontal and lateral angiograms with views of the entire head were obtained. No evidence of thromboembolic complication noted. The catheter construct was subsequently withdrawn. Flat panel CT of the head was obtained and post processed in a separate workstation with concurrent attending physician supervision. Selected images were sent to PACS. Stable appearance of known is left sided subarachnoid hemorrhage with no evidence of intra procedural bleed. A right common femoral artery angiogram with right anterior oblique view was obtained. Mild atherosclerotic changes of the right common femoral artery are noted, without stenosis. The access is at the level of the common femoral artery which has appropriate caliber for closure device utilization. The 8 French sheath was removed over the wire and a Perclose ProGlide closure device was used for access closure. Immediate hemostasis was achieved. 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 PLAN: 1. Patient transferred to ICU for anesthesia recovery and vasospasm watch. 2. Brilinta can be discontinued while remaining on aspirin 81 mg. Electronically Signed   By: Pedro Earls M.D.   On: 08/07/2019 13:43   IR CT Head Ltd  Result Date: 08/07/2019 INDICATION: 64 year old male presenting to the emergency with 1 day history of intermittent dysphasia and right hand numbness and weakness. Head CT showed a left sylvian subarachnoid hemorrhage (mFS 1). 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 He had been started on dual anti-platelet therapy with aspirin and Brilinta 5 days ago in anticipation to schedule procedure on 08/07/2019. At admission,  he presented mild expressive aphasia with no motor deficit. He was then taken to our service for emergency angiogram and left MCA aneurysm treatment. EXAM: Ultrasound-guided vascular access Diagnostic cerebral angiogram 3D rotational angiogram Balloon assisted coil embolization of brain aneurysm Flat panel CT COMPARISON:  Diagnostic cerebral angiogram July 19, 2019 MEDICATIONS: No antibiotics utilized during the procedure. A total of 5000 units of heparin IV administered. ANESTHESIA/SEDATION: The procedure was performed under general anesthesia. CONTRAST:  140 cc of Omnipaque 240 FLUOROSCOPY TIME:  Fluoroscopy Time: 72 minutes 18 seconds (3059 mGy). COMPLICATIONS: None immediate. TECHNIQUE: Informed written consent was obtained from the patient after a thorough discussion of the procedural risks, benefits and alternatives. All questions were addressed. Maximal Sterile Barrier  Technique was utilized including caps, mask, sterile gowns, sterile gloves, sterile drape, hand hygiene and skin antiseptic. A timeout was performed prior to the initiation of the procedure. Real-time ultrasound guidance was utilized for vascular access including the acquisition of a permanent ultrasound image documenting patency of the accessed vessel. Using a micropuncture kit and the modified Seldinger technique, access was gained to the right common femoral artery and an 8 French sheath was placed. Then, a 5 [place] DAV catheter was navigated over a 0.035 inch Terumo Glidewire into the aortic arch under fluoroscopy. The catheter was then placed into the left common carotid artery. Frontal and lateral road map was obtained. The catheter was then navigated into the left internal artery. Frontal and lateral angiograms of the head were. 3D rotational angiograms were acquired and post processed in a separate workstation under concurrent attending physician supervision. Selected images were sent to PACS. Magnified frontal and lateral angiograms of the head in working projections were obtained. 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 PROCEDURE: The DAV catheter was exchanged over the wire and under biplane roadmap for a cerabase guide catheter which was placed in the cervical left ICA. A 6 [place] intermediate catheter was then navigated over the [employer] into the petrous segment of the left ICA. Magnified frontal and lateral angiograms of the head were obtained in the working projections. Using biplane roadmap, a headway duo microcatheter was navigated over a synchro 2 micro guidewire into the left MCA bifurcation aneurysm pouch. Attempted primary coiling proved unsuccessful due to wide neck. A 3 x 15 mm Transform compliant balloon was navigated over a synchro 14 micro guidewire in a parallel fashion through the [place] intermediate catheter  into the left MCA M2 inferior division branch with the balloon at the level of the aneurysm neck. Then, a 4 mm x 6 cm target 360 soft framing coil was placed within the aneurysm pouch while the transform balloon was inflated. The balloon was then deflated. The coil mass remained stable within the aneurysm pouch. Control angiogram showed maintained patency of the daughter vessels. The coil was then detached. In a similar fashion, 3 filling and 2 finishing coils were placed within the aneurysm pouch (3 mm x 6 cm target 360 ultra, 3 mm x 4 cm target 360 ultra, 2.5 mm x 4 cm target 360 Nano, 1.5 mm by 4 cm target 360 nano and 1.5 mm x 4 cm target 360 nano). Control angiograms were obtained. The microcatheter was then removed followed by removal of the transform balloon. Follow-up magnified frontal and lateral angiograms of the head in the working projections were obtained showing adequate aneurysm occlusion with good coil packing density and preservation of the parent  artery and daughter arteries. Delayed magnified frontal and lateral angiograms of the head in the working projections were obtained showing stable coil mass and no evidence of clot formation on the coil mass surface. Frontal and lateral angiograms with views of the entire head were obtained. No evidence of thromboembolic complication noted. The catheter construct was subsequently withdrawn. Flat panel CT of the head was obtained and post processed in a separate workstation with concurrent attending physician supervision. Selected images were sent to PACS. Stable appearance of known is left sided subarachnoid hemorrhage with no evidence of intra procedural bleed. A right common femoral artery angiogram with right anterior oblique view was obtained. Mild atherosclerotic changes of the right common femoral artery are noted, without stenosis. The access is at the level of the common femoral artery which has appropriate caliber for closure device utilization.  The 8 French sheath was removed over the wire and a Perclose ProGlide closure device was used for access closure. Immediate hemostasis was achieved. 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 PLAN: 1. Patient transferred to ICU for anesthesia recovery and vasospasm watch. 2. Brilinta can be discontinued while remaining on aspirin 81 mg. Electronically Signed   By: Pedro Earls M.D.   On: 08/07/2019 13:43   IR US Guide Vasc Access Right  Result Date: 08/07/2019 INDICATION: 64 year old male presenting to the emergency with 1 day history of intermittent dysphasia and right hand numbness and weakness. Head CT showed a left sylvian subarachnoid hemorrhage (mFS 1). 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 He had been started on dual anti-platelet therapy with aspirin and Brilinta 5 days ago in anticipation to schedule procedure on 08/07/2019. At admission, he presented mild expressive aphasia with no motor deficit. He was then taken to our service for emergency angiogram and left MCA aneurysm treatment. EXAM: Ultrasound-guided vascular access Diagnostic cerebral angiogram 3D rotational angiogram Balloon assisted coil embolization of brain aneurysm Flat panel CT COMPARISON:  Diagnostic cerebral angiogram July 19, 2019 MEDICATIONS: No antibiotics utilized during the procedure. A total of 5000 units of heparin IV administered. ANESTHESIA/SEDATION: The procedure was performed under general anesthesia. CONTRAST:  140 cc of Omnipaque 240 FLUOROSCOPY TIME:  Fluoroscopy Time: 72 minutes 18 seconds (3059 mGy). COMPLICATIONS: None immediate. TECHNIQUE:  Informed written consent was obtained from the patient after a thorough discussion of the procedural risks, benefits and alternatives. All questions were addressed. Maximal Sterile Barrier Technique was utilized including caps, mask, sterile gowns, sterile gloves, sterile drape, hand hygiene and skin antiseptic. A timeout was performed prior to the initiation of the procedure. Real-time ultrasound guidance was utilized for vascular access including the acquisition of a permanent ultrasound image documenting patency of the accessed vessel. Using a micropuncture kit and the modified Seldinger technique, access was gained to the right common femoral artery and an 8 French sheath was placed. Then, a 5 [place] DAV catheter was navigated over a 0.035 inch Terumo Glidewire into the aortic arch under fluoroscopy. The catheter was then placed into the left common carotid artery. Frontal and lateral road map was obtained. The catheter was then navigated into the left internal artery. Frontal and lateral angiograms of the head  were. 3D rotational angiograms were acquired and post processed in a separate workstation under concurrent attending physician supervision. Selected images were sent to PACS. Magnified frontal and lateral angiograms of the head in working projections were obtained. 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 PROCEDURE: The DAV catheter was exchanged over the wire and under biplane roadmap for a cerabase guide catheter which was placed in the cervical left ICA. A 6 [place] intermediate catheter was then navigated over the [employer] into the petrous segment of the left ICA. Magnified frontal and lateral angiograms of the head were obtained in the working projections. Using biplane roadmap, a headway duo microcatheter was navigated over a synchro 2 micro guidewire into the left MCA bifurcation aneurysm pouch. Attempted primary coiling  proved unsuccessful due to wide neck. A 3 x 15 mm Transform compliant balloon was navigated over a synchro 14 micro guidewire in a parallel fashion through the [place] intermediate catheter into the left MCA M2 inferior division branch with the balloon at the level of the aneurysm neck. Then, a 4 mm x 6 cm target 360 soft framing coil was placed within the aneurysm pouch while the transform balloon was inflated. The balloon was then deflated. The coil mass remained stable within the aneurysm pouch. Control angiogram showed maintained patency of the daughter vessels. The coil was then detached. In a similar fashion, 3 filling and 2 finishing coils were placed within the aneurysm pouch (3 mm x 6 cm target 360 ultra, 3 mm x 4 cm target 360 ultra, 2.5 mm x 4 cm target 360 Nano, 1.5 mm by 4 cm target 360 nano and 1.5 mm x 4 cm target 360 nano). Control angiograms were obtained. The microcatheter was then removed followed by removal of the transform balloon. Follow-up magnified frontal and lateral angiograms of the head in the working projections were obtained showing adequate aneurysm occlusion with good coil packing density and preservation of the parent artery and daughter arteries. Delayed magnified frontal and lateral angiograms of the head in the working projections were obtained showing stable coil mass and no evidence of clot formation on the coil mass surface. Frontal and lateral angiograms with views of the entire head were obtained. No evidence of thromboembolic complication noted. The catheter construct was subsequently withdrawn. Flat panel CT of the head was obtained and post processed in a separate workstation with concurrent attending physician supervision. Selected images were sent to PACS. Stable appearance of known is left sided subarachnoid hemorrhage with no evidence of intra procedural bleed. A right common femoral artery angiogram with right anterior oblique view was obtained. Mild atherosclerotic  changes of the right common femoral artery are noted, without stenosis. The access is at the level of the common femoral artery which has appropriate caliber for closure device utilization. The 8 French sheath was removed over the wire and a Perclose ProGlide closure device was used for access closure. Immediate hemostasis was achieved. 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 PLAN: 1. Patient transferred to ICU for anesthesia recovery and vasospasm watch. 2. Brilinta can be discontinued while remaining on aspirin 81 mg. Electronically Signed   By: Pedro Earls M.D.   On: 08/07/2019 13:43   Chest Port 1 View  Result Date: 08/06/2019 CLINICAL DATA:  64 year old male code stroke presentation with multiple intracranial aneurysms on MRA/conventional cerebral angiogram and presenting  with aneurysmal type subarachnoid hemorrhage today. EXAM: PORTABLE CHEST 1 VIEW COMPARISON:  None. FINDINGS: Portable AP upright view at 1320 hours. Lung volumes and mediastinal contours are within normal limits. Visualized tracheal air column is within normal limits. Allowing for portable technique the lungs are clear. No pneumothorax. No acute osseous abnormality identified. IMPRESSION: No acute cardiopulmonary abnormality. Electronically Signed   By: Genevie Ann M.D.   On: 08/06/2019 13:28   EEG adult  Result Date: 08/07/2019 [name], MD     08/07/2019  2:03 PM Patient Name: AMARIO LONGMORE MRN: [id number] Epilepsy Attending: [name] Referring Physician/Provider: Dr. Rosalin Hawking Date: 08/07/2019 Duration: 24.20 minutes Patient history: 64 year old male with left frontoparietal aneurysmal subarachnoid hemorrhage status post coiling.  EEG evaluate for seizures. Level of alertness: Awake, drowsy AEDs during EEG study: None Technical aspects: This EEG study was done with scalp electrodes positioned according to the 10-20  International system of electrode placement. Electrical activity was acquired at a sampling rate of 500Hz  and reviewed with a high frequency filter of 70Hz  and a low frequency filter of 1Hz . EEG data were recorded continuously and digitally stored. Description: The posterior dominant rhythm consists of 9 Hz activity of moderate voltage (25-35 uV) seen predominantly in posterior head regions, symmetric and reactive to eye opening and eye closing.  Drowsiness was characterized by attenuation of the posterior background rhythm and roving eye movements. Hyperventilation and photic stimulation were not performed. IMPRESSION: This study is within normal limits. No seizures or epileptiform discharges were seen throughout the recording. [name]   ECHOCARDIOGRAM COMPLETE  Result Date: 08/07/2019    ECHOCARDIOGRAM REPORT   Patient Name:   Dariusz A Zachar Date of Exam: 08/07/2019 Medical Rec #:  [id number]      Height:       71.0 in Accession #:    [id number]     Weight:       190.0 lb Date of Birth:  06/13/1955      BSA:          2.063 m Patient Age:    64 years       BP:           109/65 mmHg Patient Gender: M              HR:           79 bpm. Exam Location:  Inpatient Procedure: 2D Echo Indications:    Stroke I163.9  History:        Patient has no prior history of Echocardiogram examinations.  Sonographer:    Mikki Santee RDCS (AE) Referring Phys: [id number] [place]  1. Left ventricular ejection fraction, by estimation, is 60 to 65%. The left ventricle has normal function. The left ventricle has no regional wall motion abnormalities. Left ventricular diastolic parameters are consistent with Grade II diastolic dysfunction (pseudonormalization).  2. Right ventricular systolic function is normal. The right ventricular size is normal.  3. The mitral valve is normal in structure. No evidence of mitral valve regurgitation. No evidence of mitral stenosis.  4. The aortic valve was not well visualized.  Aortic valve regurgitation is not visualized. No aortic stenosis is present.  5. Aortic dilatation noted. 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  6. The inferior vena cava is normal in size with greater than 50% respiratory variability, suggesting right atrial pressure of 3 mmHg. Conclusion(s)/Recommendation(s): No intracardiac source of embolism detected on this transthoracic study.  A transesophageal echocardiogram is recommended to exclude cardiac source of embolism if clinically indicated. FINDINGS  Left Ventricle: Left ventricular ejection fraction, by estimation, is 60 to 65%. The left ventricle has normal function. The left ventricle has no regional wall motion abnormalities. The left ventricular internal cavity size was normal in size. There is  no left ventricular hypertrophy. Left ventricular diastolic parameters are consistent with Grade II diastolic dysfunction (pseudonormalization). Right Ventricle: The right ventricular size is normal. No increase in right ventricular wall thickness. Right ventricular systolic function is normal. Left Atrium: Left atrial size was normal in size. Right Atrium: Right atrial size was normal in size. Pericardium: There is no evidence of pericardial effusion. Mitral Valve: The mitral valve is normal in structure. Normal mobility of the mitral valve leaflets. No evidence of mitral valve regurgitation. No evidence of mitral valve stenosis. Tricuspid Valve: The tricuspid valve is normal in structure. Tricuspid valve regurgitation is not demonstrated. No evidence of tricuspid stenosis. Aortic Valve: The aortic valve was not well visualized. Aortic valve regurgitation is not visualized. No aortic stenosis is present. Pulmonic Valve: The pulmonic valve was normal in structure. Pulmonic valve regurgitation is not visualized. No evidence of pulmonic stenosis. Aorta: Aortic dilatation noted. 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  Venous: The inferior vena cava is normal in size with greater than 50% respiratory variability, suggesting right atrial pressure of 3 mmHg. IAS/Shunts: No atrial level shunt detected by color flow Doppler.  LEFT VENTRICLE PLAX 2D LVIDd:         4.50 cm  Diastology LVIDs:         3.00 cm  LV e' lateral:   9.57 cm/s LV PW:         1.00 cm  LV E/e' lateral: 6.7 LV IVS:        1.00 cm  LV e' medial:    9.36 cm/s LVOT diam:     2.70 cm  LV E/e' medial:  6.8 LV SV:         102 LV SV Index:   49 LVOT Area:     5.73 cm  RIGHT VENTRICLE RV S prime:     15.20 cm/s TAPSE (M-mode): 1.7 cm LEFT ATRIUM             Index       RIGHT ATRIUM           Index LA diam:        3.20 cm 1.55 cm/m  RA Area:     14.30 cm LA Vol (A2C):   53.6 ml 25.98 ml/m RA Volume:   34.90 ml  16.92 ml/m LA Vol (A4C):   43.6 ml 21.13 ml/m LA Biplane Vol: 49.6 ml 24.04 ml/m  AORTIC VALVE LVOT Vmax:   87.50 cm/s LVOT Vmean:  58.100 cm/s LVOT VTI:    0.178 m  AORTA Ao Root diam: 3.80 cm MITRAL VALVE MV Area (PHT): 2.52 cm    SHUNTS MV Decel Time: 301 msec    Systemic VTI:  0.18 m MV E velocity: 63.80 cm/s  Systemic Diam: 2.70 cm MV A velocity: 57.00 cm/s MV E/A ratio:  1.12 Candee Furbish MD Electronically signed by Candee Furbish MD Signature Date/Time: 08/07/2019/11:13:11 AM    Final    CT HEAD CODE STROKE WO CONTRAST  Addendum Date: 08/06/2019   ADDENDUM REPORT: 08/06/2019 12:51 ADDENDUM: These results were called by telephone at the time of interpretation on 08/06/2019 at 12:51 pm to  provider Dr. Cheral Marker, who verbally acknowledged these results. Electronically Signed   By: Kellie Simmering DO   On: 08/06/2019 12:51   Result Date: 08/06/2019 CLINICAL DATA:  Code stroke. Neuro deficit, acute, stroke suspected. Difficulty speaking. EXAM: CT HEAD WITHOUT CONTRAST TECHNIQUE: Contiguous axial images were obtained from the base of the skull through the vertex without intravenous contrast. COMPARISON:  MRI/MRA head 07/10/2019. FINDINGS: Brain: There is moderate  volume acute subareolar hemorrhage greatest along the left frontal lobe convexity and within the left sylvian fissure. No acute demarcated cortical infarct is identified. No extra-axial fluid collection. No evidence of intracranial mass. No midline shift. Vascular: No hyperdense vessel. Skull: Normal. Negative for fracture or focal lesion. Sinuses/Orbits: Visualized orbits show no acute finding. Mild ethmoid sinus mucosal thickening. No significant mastoid effusion. IMPRESSION: Moderate volume acute subarachnoid hemorrhage greatest along the left frontal lobe convexity and within the left sylvian fissure. 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 No hydrocephalus. No acute demarcated cortical infarct. Electronically Signed: By: Kellie Simmering DO On: 08/06/2019 12:39   VAS [place] TRANSCRANIAL DOPPLER  Result Date: 08/09/2019  Transcranial Doppler Indications: Subarachnoid hemorrhage. Comparison Study: 08/07/2019 Performing Technologist: Oliver Hum RVT  Examination Guidelines: A complete evaluation includes B-mode imaging, spectral Doppler, color Doppler, and power Doppler as needed of all accessible portions of each vessel. Bilateral testing is considered an integral part of a complete examination. Limited examinations for reoccurring indications may be performed as noted.  +----------+-------------+----------+-----------+-------+ RIGHT TCD Right VM (cm)Depth (cm)PulsatilityComment +----------+-------------+----------+-----------+-------+ MCA           65.00                 1.21            +----------+-------------+----------+-----------+-------+ ACA          -34.00                 0.93            +----------+-------------+----------+-----------+-------+ Term ICA      64.00                 1.21            +----------+-------------+----------+-----------+-------+  PCA           33.00                 1.36            +----------+-------------+----------+-----------+-------+ Opthalmic     18.00                 1.49            +----------+-------------+----------+-----------+-------+ ICA siphon    21.00                 1.71            +----------+-------------+----------+-----------+-------+ Vertebral    -18.00                 0.92            +----------+-------------+----------+-----------+-------+  +----------+------------+----------+-----------+-------+ LEFT TCD  Left VM (cm)Depth (cm)PulsatilityComment +----------+------------+----------+-----------+-------+ MCA          39.00                 0.77            +----------+------------+----------+-----------+-------+ ACA          -  47.00                  1             +----------+------------+----------+-----------+-------+ Term ICA     68.00                 1.02            +----------+------------+----------+-----------+-------+ PCA          49.00                 0.75            +----------+------------+----------+-----------+-------+ Opthalmic    22.00                 1.68            +----------+------------+----------+-----------+-------+ ICA siphon   28.00                 1.74            +----------+------------+----------+-----------+-------+ Vertebral    -27.00                1.01            +----------+------------+----------+-----------+-------+  +------------+-------+-------+             VM cm/sComment +------------+-------+-------+ Prox Basilar-24.00   0.7   +------------+-------+-------+ Dist Basilar-36.00   0.9   +------------+-------+-------+    Preliminary    VAS [place] TRANSCRANIAL DOPPLER  Result Date: 08/08/2019  Transcranial Doppler Indications: Subarachnoid hemorrhage. Performing Technologist: June Leap RDMS, RVT  Examination Guidelines: A complete evaluation includes B-mode imaging, spectral Doppler, color Doppler, and power  Doppler as needed of all accessible portions of each vessel. Bilateral testing is considered an integral part of a complete examination. Limited examinations for reoccurring indications may be performed as noted.  +----------+-------------+----------+-----------+-------+ RIGHT TCD Right VM (cm)Depth (cm)PulsatilityComment +----------+-------------+----------+-----------+-------+ MCA           88.00                 1.24            +----------+-------------+----------+-----------+-------+ ACA          -61.00                 1.02            +----------+-------------+----------+-----------+-------+ Term ICA      47.00                 0.89            +----------+-------------+----------+-----------+-------+ PCA           37.00                 1.15            +----------+-------------+----------+-----------+-------+ Opthalmic     24.00                 2.04            +----------+-------------+----------+-----------+-------+ ICA siphon    31.00                 1.76            +----------+-------------+----------+-----------+-------+ Vertebral    -20.00                 1.14            +----------+-------------+----------+-----------+-------+  +----------+------------+----------+-----------+-------+ LEFT TCD  Left VM (cm)Depth (cm)PulsatilityComment +----------+------------+----------+-----------+-------+ MCA  74.00                 1.22            +----------+------------+----------+-----------+-------+ ACA          -31.00                 1.2            +----------+------------+----------+-----------+-------+ Term ICA     36.00                 1.59            +----------+------------+----------+-----------+-------+ PCA          54.00                 1.08            +----------+------------+----------+-----------+-------+ Opthalmic    25.00                 2.39            +----------+------------+----------+-----------+-------+ ICA  siphon   36.00                 0.93            +----------+------------+----------+-----------+-------+ Vertebral    -25.00                1.25            +----------+------------+----------+-----------+-------+  +------------+-------+-------+             VM cm/sComment +------------+-------+-------+ Prox Basilar-33.00         +------------+-------+-------+ Dist Basilar-29.00         +------------+-------+-------+ Summary:  Mildly elevated bilateral middle cerebral artery mean flow velocities of unclear significance.No definite vasospasm noted. *See table(s) above for TCD measurements and observations.  Diagnosing physician: Antony Contras MD Electronically signed by Antony Contras MD on 08/08/2019 at 8:16:03 AM.    Final    IR NEURO EACH ADD'L AFTER BASIC UNI LEFT (MS)  Result Date: 08/07/2019 INDICATION: 64 year old male presenting to the emergency with 1 day history of intermittent dysphasia and right hand numbness and weakness. Head CT showed a left sylvian subarachnoid hemorrhage (mFS 1). 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 He had been started on dual anti-platelet therapy with aspirin and Brilinta 5 days ago in anticipation to schedule procedure on 08/07/2019. At admission, he presented mild expressive aphasia with no motor deficit. He was then taken to our service for emergency angiogram and left MCA aneurysm treatment. EXAM: Ultrasound-guided vascular access Diagnostic cerebral angiogram 3D rotational angiogram Balloon assisted coil embolization of brain aneurysm Flat panel CT COMPARISON:  Diagnostic cerebral angiogram July 19, 2019 MEDICATIONS: No antibiotics utilized during the procedure. A total of 5000 units  of heparin IV administered. ANESTHESIA/SEDATION: The procedure was performed under general anesthesia. CONTRAST:  140 cc of Omnipaque 240 FLUOROSCOPY TIME:  Fluoroscopy Time: 72 minutes 18 seconds (3059 mGy). COMPLICATIONS: None immediate. TECHNIQUE: Informed written consent was obtained from the patient after a thorough discussion of the procedural risks, benefits and alternatives. All questions were addressed. Maximal Sterile Barrier Technique was utilized including caps, mask, sterile gowns, sterile gloves, sterile drape, hand hygiene and skin antiseptic. A timeout was  performed prior to the initiation of the procedure. Real-time ultrasound guidance was utilized for vascular access including the acquisition of a permanent ultrasound image documenting patency of the accessed vessel. Using a micropuncture kit and the modified Seldinger technique, access was gained to the right common femoral artery and an 8 French sheath was placed. Then, a 5 [place] DAV catheter was navigated over a 0.035 inch Terumo Glidewire into the aortic arch under fluoroscopy. The catheter was then placed into the left common carotid artery. Frontal and lateral road map was obtained. The catheter was then navigated into the left internal artery. Frontal and lateral angiograms of the head were. 3D rotational angiograms were acquired and post processed in a separate workstation under concurrent attending physician supervision. Selected images were sent to PACS. Magnified frontal and lateral angiograms of the head in working projections were obtained. 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 PROCEDURE: The DAV catheter was exchanged over the wire and under biplane roadmap for a cerabase guide catheter which was placed in the cervical left ICA. A 6 [place] intermediate catheter was then navigated over the [employer] into the petrous segment of the left ICA. Magnified  frontal and lateral angiograms of the head were obtained in the working projections. Using biplane roadmap, a headway duo microcatheter was navigated over a synchro 2 micro guidewire into the left MCA bifurcation aneurysm pouch. Attempted primary coiling proved unsuccessful due to wide neck. A 3 x 15 mm Transform compliant balloon was navigated over a synchro 14 micro guidewire in a parallel fashion through the [place] intermediate catheter into the left MCA M2 inferior division branch with the balloon at the level of the aneurysm neck. Then, a 4 mm x 6 cm target 360 soft framing coil was placed within the aneurysm pouch while the transform balloon was inflated. The balloon was then deflated. The coil mass remained stable within the aneurysm pouch. Control angiogram showed maintained patency of the daughter vessels. The coil was then detached. In a similar fashion, 3 filling and 2 finishing coils were placed within the aneurysm pouch (3 mm x 6 cm target 360 ultra, 3 mm x 4 cm target 360 ultra, 2.5 mm x 4 cm target 360 Nano, 1.5 mm by 4 cm target 360 nano and 1.5 mm x 4 cm target 360 nano). Control angiograms were obtained. The microcatheter was then removed followed by removal of the transform balloon. Follow-up magnified frontal and lateral angiograms of the head in the working projections were obtained showing adequate aneurysm occlusion with good coil packing density and preservation of the parent artery and daughter arteries. Delayed magnified frontal and lateral angiograms of the head in the working projections were obtained showing stable coil mass and no evidence of clot formation on the coil mass surface. Frontal and lateral angiograms with views of the entire head were obtained. No evidence of thromboembolic complication noted. The catheter construct was subsequently withdrawn. Flat panel CT of the head was obtained and post processed in a separate workstation with concurrent attending physician supervision.  Selected images were sent to PACS. Stable appearance of known is left sided subarachnoid hemorrhage with no evidence of intra procedural bleed. A right common femoral artery angiogram with right anterior oblique view was obtained. Mild atherosclerotic changes of the right common femoral artery are noted, without stenosis. The access is at the level of the common femoral artery which has appropriate caliber for closure device  utilization. The 8 French sheath was removed over the wire and a Perclose ProGlide closure device was used for access closure. Immediate hemostasis was achieved. 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 PLAN: 1. Patient transferred to ICU for anesthesia recovery and vasospasm watch. 2. Brilinta can be discontinued while remaining on aspirin 81 mg. Electronically Signed   By: Pedro Earls M.D.   On: 08/07/2019 13:43    Labs:  CBC: Recent Labs    08/07/19 0631 08/08/19 0219 08/09/19 0916 08/10/19 0559  WBC 9.9 7.7 7.4 7.2  HGB 12.2* 11.9* 13.2 13.0  HCT 36.0* 35.6* 40.0 38.4*  PLT 239 246 269 296    COAGS: Recent Labs    07/15/19 1344 08/06/19 1217 08/06/19 1244  INR 1.0 1.0 1.0  APTT 29 27 28     BMP: Recent Labs    08/07/19 0631 08/08/19 0219 08/09/19 0916 08/10/19 0559  NA 136 139 138 139  K 3.9 4.2 3.7 4.2  CL 104 107 106 105  CO2 17* 25 22 25   GLUCOSE 114* 105* 104* 101*  BUN 15 15 15 20   CALCIUM 8.1* 8.6* 9.0 9.2  CREATININE 0.80 0.86 0.77 0.96  GFRNONAA >60 >60 >60 >60  GFRAA >60 >60 >60 >60    LIVER FUNCTION TESTS: Recent Labs    08/06/19 1217  BILITOT 0.9  AST 49*  ALT 46*  ALKPHOS 60  PROT 7.4  ALBUMIN 4.0    Assessment and Plan:  L MCA aneurysm embolization 08/06/19 Doing well Speech improving daily Plan per Dr Tomi Likens  Electronically Signed: Lavonia Drafts, PA-C 08/10/2019, 7:36 AM   I spent a total of 15 Minutes at the  the patient's bedside AND on the patient's hospital floor or unit, greater than 50% of which was counseling/coordinating care for L MCA embolization

## 2019-08-10 NOTE — Progress Notes (Signed)
STROKE TEAM PROGRESS NOTE   INTERVAL HISTORY No complaints were reported today.  Vitals:   08/09/19 2000 08/09/19 2200 08/10/19 0000 08/10/19 0400  BP: 138/80 (!) 146/97 (!) 141/84 129/85  Pulse:  64    Resp: 16 11 16    Temp: 98.9 F (37.2 C)  98.8 F (37.1 C) 98.4 F (36.9 C)  TempSrc: Oral  Axillary Oral  SpO2: 98% 99% 97% 98%  Weight:      Height:        CBC:  Recent Labs  Lab 08/06/19 1217 08/06/19 1226 08/09/19 0916 08/10/19 0559  WBC 7.7   < > 7.4 7.2  NEUTROABS 5.7  --   --   --   HGB 13.9   < > 13.2 13.0  HCT 41.4   < > 40.0 38.4*  MCV 106.4*   < > 106.1* 104.6*  PLT 264   < > 269 296   < > = values in this interval not displayed.    Basic Metabolic Panel:  Recent Labs  Lab 08/08/19 0219 08/09/19 0916  NA 139 138  K 4.2 3.7  CL 107 106  CO2 25 22  GLUCOSE 105* 104*  BUN 15 15  CREATININE 0.86 0.77  CALCIUM 8.6* 9.0  MG 1.8  --   PHOS 2.3*  --    Lipid Panel:     Component Value Date/Time   CHOL 218 (H) 08/06/2019 1244   TRIG 106 08/07/2019 1422   HDL 44 08/06/2019 1244   CHOLHDL 5.0 08/06/2019 1244   VLDL 25 08/06/2019 1244   LDLCALC 149 (H) 08/06/2019 1244   HgbA1c:  Lab Results  Component Value Date   HGBA1C 5.2 08/07/2019    IMAGING past 24 hours  VAS 10/07/2019 TRANSCRANIAL DOPPLER 08/09/2019  Transcranial Doppler Indications: Subarachnoid hemorrhage. Comparison Study: 08/07/2019 Performing Technologist: 10/07/2019 RVT  Examination Guidelines: A complete evaluation includes B-mode imaging, spectral Doppler, color Doppler, and power Doppler as needed of all accessible portions of each vessel. Bilateral testing is considered an integral part of a complete examination. Limited examinations for reoccurring indications may be performed as noted.  +----------+-------------+----------+-----------+-------+  RIGHT TCD Right VM (cm)Depth (cm)PulsatilityComment +----------+-------------+----------+-----------+-------+ MCA           65.00                  1.21            +----------+-------------+----------+-----------+-------+ ACA          -34.00                 0.93            +----------+-------------+----------+-----------+-------+ Term ICA      64.00                 1.21            +----------+-------------+----------+-----------+-------+ PCA           33.00                 1.36            +----------+-------------+----------+-----------+-------+ Opthalmic     18.00                 1.49            +----------+-------------+----------+-----------+-------+ ICA siphon    21.00                 1.71            +----------+-------------+----------+-----------+-------+  Vertebral    -18.00                 0.92            +----------+-------------+----------+-----------+-------+  +----------+------------+----------+-----------+-------+  LEFT TCD  Left VM (cm)Depth (cm)PulsatilityComment +----------+------------+----------+-----------+-------+ MCA          39.00                 0.77            +----------+------------+----------+-----------+-------+ ACA          -47.00                  1             +----------+------------+----------+-----------+-------+ Term ICA     68.00                 1.02            +----------+------------+----------+-----------+-------+ PCA          49.00                 0.75            +----------+------------+----------+-----------+-------+ Opthalmic    22.00                 1.68            +----------+------------+----------+-----------+-------+ ICA siphon   28.00                 1.74            +----------+------------+----------+-----------+-------+ Vertebral    -27.00                1.01            +----------+------------+----------+-----------+-------+  +------------+-------+-------+             VM cm/sComment +------------+-------+-------+ Prox Basilar-24.00   0.7   +------------+-------+-------+ Dist Basilar-36.00   0.9    +------------+-------+-------+    Preliminary     PHYSICAL EXAM   Temp:  [98.2 F (36.8 C)-98.9 F (37.2 C)] 98.4 F (36.9 C) (05/08 0400) Pulse Rate:  [64-68] 64 (05/07 2200) Resp:  [11-17] 16 (05/08 0000) BP: (126-146)/(80-99) 129/85 (05/08 0400) SpO2:  [96 %-99 %] 98 % (05/08 0400)  General - Well nourished, well developed, in no apparent distress.  Ophthalmologic - fundi not visualized due to noncooperation.  Cardiovascular - Regular rhythm and rate.  Mental Status -  Level of arousal and orientation to time, place, and person were intact. Language exam showed mild expressive aphasia, still significant hesitation, able to name and slow repetition. Comprehension intact. Attention span and concentration were normal. Fund of Knowledge was assessed and was intact.  Cranial Nerves II - XII - II - Visual field intact OU. III, IV, VI - Extraocular movements intact. V - Facial sensation intact bilaterally. VII -.  No facial droop noted today.   VIII - Hearing & vestibular intact bilaterally. X - Palate elevates symmetrically. XI - Chin turning & shoulder shrug intact bilaterally. XII - Tongue protrusion intact.  Motor Strength - The patient's strength was normal in all extremities and pronator drift was absent except right hand grip 5-/5.  Bulk was normal and fasciculations were absent.   Motor Tone - Muscle tone was assessed at the neck and appendages and was normal.  Reflexes - The patient's reflexes were symmetrical in all extremities and he had no pathological reflexes.  Sensory - Light touch, temperature/pinprick were  assessed and were symmetrical.    Coordination - The patient had normal movements in the hands with no ataxia or dysmetria.  Tremor was absent.  Gait and Station - deferred.   ASSESSMENT/PLAN [name] is a 64 y.o. 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 He was  taking aspirin and brilinta as of 4/28 and scheduled for an endovascular procedure to treat his RIGHT sided aneurysm 5/5, however, on 5/4 he presented with intermittent dysphagia, R hand numbness and L occipital HA. CT showed L frontoparietal SAH.  He was taken to IR for aneurysm securement -> s/p balloon assisted coiling  Aneurysmal SAH:  L frontoparietal SAH d/t L MCA aneurysm rupture s/p emergent coiling  Code Stroke CT head moderate L frontal lobe convexity SAH.   Cerebral angio 4/16 - blister like aneurysm right ICA paraophthalmic segment 5.67mm. and left MCA bifurcation saccular aneurysm wide neck 4.7mm  Cerebral angio 5/4 - 4.74mm wide neck L MCA bifurcation aneurysm s/p balloon assisted coiling.   MRI Scattered SAH L sylvian fissure and L frontoparietal convexity stable. Probable few punctate L frontal and parietal lobe infarcts, other DWI abnormality d/t SAH. L MCA coil.  2D Echo EF 60-65%. No source of embolus   TCD MWF 5/5 no vasospasm   EEG no sz  LDL 149  HgbA1c 5.2   On nimodipine 60 mg q4h x 21 days  SCDs for VTE prophylaxis  aspirin 81 mg daily and Brilinta 90 daily prior to admission, on aspirin 81 mg daily. Stopped Brilinta.   Therapy recommendations:  No PT, OP PT - Outpt speech therapy rcommended  Disposition:  Plan return home (plan at least 21 days as inpatient)  Blood Pressure  Home meds:  None, no hx HTN  Off Cleviprex  Labetalol PRN . SBP goal < 160 (BP is currently < than 150) . Long-term BP goal normotensive  Hyperlipidemia  Home meds:  No statin  LDL 149  On lipitor 40  Continue on discharge  Alcohol use  ETOH use, alcohol intake elevated lately  Advised to drink no more than 2 drink(s) a day.   Placed on CIWA protocol  (prn ativan)  FA/B1/MVI  Seizure precaution  Tobacco abuse  Current smoker  Smoking cessation counseling provided  Nicotine patch provided   Pt is willing to quit  Other Stroke Risk Factors  Family  hx aneurysm (father)  Other Active Problems  Code status - Full code  Labs stable  Conductive hearing loss in left ear  Hospital day # 4      To contact Stroke Continuity provider, please refer to [email or web address] After hours, contact General Neurology

## 2019-08-11 LAB — BASIC METABOLIC PANEL
Anion gap: 11 (ref 5–15)
BUN: 15 mg/dL (ref 8–23)
CO2: 21 mmol/L — ABNORMAL LOW (ref 22–32)
Calcium: 9.2 mg/dL (ref 8.9–10.3)
Chloride: 108 mmol/L (ref 98–111)
Creatinine, Ser: 0.73 mg/dL (ref 0.61–1.24)
GFR calc Af Amer: >60 mL/min (ref >60)
GFR calc non Af Amer: >60 mL/min (ref >60)
Glucose, Bld: 99 mg/dL (ref 70–99)
Potassium: 3.7 mmol/L (ref 3.5–5.1)
Sodium: 140 mmol/L (ref 135–145)

## 2019-08-11 LAB — CBC
HCT: 39.9 % (ref 39.0–52.0)
Hemoglobin: 13.6 g/dL (ref 13.0–17.0)
MCH: 35.3 pg — ABNORMAL HIGH (ref 26.0–34.0)
MCHC: 34.1 g/dL (ref 30.0–36.0)
MCV: 103.6 fL — ABNORMAL HIGH (ref 80.0–100.0)
Platelets: 338 10*3/uL (ref 150–400)
RBC: 3.85 MIL/uL — ABNORMAL LOW (ref 4.22–5.81)
RDW: 12.7 % (ref 11.5–15.5)
WBC: 7.5 10*3/uL (ref 4.0–10.5)
nRBC: 0 % (ref 0.0–0.2)

## 2019-08-11 NOTE — Progress Notes (Signed)
STROKE TEAM PROGRESS NOTE   INTERVAL HISTORY No complaints were reported today.  Vitals:   08/11/19 0400 08/11/19 0422 08/11/19 0500 08/11/19 0600  BP: 134/83  (!) 142/81 110/62  Pulse: (!) 58     Resp: 18  12 14   Temp:  97.7 F (36.5 C)    TempSrc:      SpO2: 99%  100% 99%  Weight:      Height:        CBC:  Recent Labs  Lab 08/06/19 1217 08/06/19 1226 08/10/19 0559 08/11/19 0628  WBC 7.7   < > 7.2 7.5  NEUTROABS 5.7  --   --   --   HGB 13.9   < > 13.0 13.6  HCT 41.4   < > 38.4* 39.9  MCV 106.4*   < > 104.6* 103.6*  PLT 264   < > 296 338   < > = values in this interval not displayed.    Basic Metabolic Panel:  Recent Labs  Lab 08/08/19 0219 08/09/19 0916 08/10/19 0559 08/11/19 0628  NA 139   < > 139 140  K 4.2   < > 4.2 3.7  CL 107   < > 105 108  CO2 25   < > 25 21*  GLUCOSE 105*   < > 101* 99  BUN 15   < > 20 15  CREATININE 0.86   < > 0.96 0.73  CALCIUM 8.6*   < > 9.2 9.2  MG 1.8  --   --   --   PHOS 2.3*  --   --   --    < > = values in this interval not displayed.   Lipid Panel:     Component Value Date/Time   CHOL 218 (H) 08/06/2019 1244   TRIG 106 08/07/2019 1422   HDL 44 08/06/2019 1244   CHOLHDL 5.0 08/06/2019 1244   VLDL 25 08/06/2019 1244   LDLCALC 149 (H) 08/06/2019 1244   HgbA1c:  Lab Results  Component Value Date   HGBA1C 5.2 08/07/2019    IMAGING past 24 hours  VAS [place] TRANSCRANIAL DOPPLER 08/09/2019  Transcranial Doppler Indications: Subarachnoid hemorrhage. Comparison Study: 08/07/2019 Performing Technologist: Oliver Hum RVT  Examination Guidelines: A complete evaluation includes B-mode imaging, spectral Doppler, color Doppler, and power Doppler as needed of all accessible portions of each vessel. Bilateral testing is considered an integral part of a complete examination. Limited examinations for reoccurring indications may be performed as noted.  +----------+-------------+----------+-----------+-------+  RIGHT TCD Right VM  (cm)Depth (cm)PulsatilityComment +----------+-------------+----------+-----------+-------+ MCA           65.00                 1.21            +----------+-------------+----------+-----------+-------+ ACA          -34.00                 0.93            +----------+-------------+----------+-----------+-------+ Term ICA      64.00                 1.21            +----------+-------------+----------+-----------+-------+ PCA           33.00                 1.36            +----------+-------------+----------+-----------+-------+ Opthalmic  18.00                 1.49            +----------+-------------+----------+-----------+-------+ ICA siphon    21.00                 1.71            +----------+-------------+----------+-----------+-------+ Vertebral    -18.00                 0.92            +----------+-------------+----------+-----------+-------+  +----------+------------+----------+-----------+-------+  LEFT TCD  Left VM (cm)Depth (cm)PulsatilityComment +----------+------------+----------+-----------+-------+ MCA          39.00                 0.77            +----------+------------+----------+-----------+-------+ ACA          -47.00                  1             +----------+------------+----------+-----------+-------+ Term ICA     68.00                 1.02            +----------+------------+----------+-----------+-------+ PCA          49.00                 0.75            +----------+------------+----------+-----------+-------+ Opthalmic    22.00                 1.68            +----------+------------+----------+-----------+-------+ ICA siphon   28.00                 1.74            +----------+------------+----------+-----------+-------+ Vertebral    -27.00                1.01            +----------+------------+----------+-----------+-------+  +------------+-------+-------+             VM cm/sComment  +------------+-------+-------+ Prox Basilar-24.00   0.7   +------------+-------+-------+ Dist Basilar-36.00   0.9   +------------+-------+-------+    Preliminary     PHYSICAL EXAM   Temp:  [97.7 F (36.5 C)-98.4 F (36.9 C)] 97.7 F (36.5 C) (05/09 0422) Pulse Rate:  [58-92] 58 (05/09 0400) Resp:  [11-23] 14 (05/09 0600) BP: (110-155)/(60-120) 110/62 (05/09 0600) SpO2:  [84 %-100 %] 99 % (05/09 0600)  General - Well nourished, well developed, in no apparent distress.  Ophthalmologic - fundi not visualized due to noncooperation.  Cardiovascular - Regular rhythm and rate.  Mental Status -  Level of arousal and orientation to time, place, and person were intact. Language exam showed mild expressive aphasia, still significant hesitation, able to name and slow repetition. Comprehension intact. Attention span and concentration were normal. Fund of Knowledge was assessed and was intact.  Cranial Nerves II - XII - II - Visual field intact OU. III, IV, VI - Extraocular movements intact. V - Facial sensation intact bilaterally. VII -.  No facial droop noted today.   VIII - Hearing & vestibular intact bilaterally. X - Palate elevates symmetrically. XI - Chin turning & shoulder shrug intact bilaterally. XII - Tongue protrusion intact.  Motor Strength - The patient's  strength was normal in all extremities and pronator drift was absent except right hand grip 5-/5.  Bulk was normal and fasciculations were absent.   Motor Tone - Muscle tone was assessed at the neck and appendages and was normal.  Reflexes - The patient's reflexes were symmetrical in all extremities and he had no pathological reflexes.  Sensory - Light touch, temperature/pinprick were assessed and were symmetrical.    Coordination - The patient had normal movements in the hands with no ataxia or dysmetria.  Tremor was absent.  Gait and Station - deferred.   ASSESSMENT/PLAN [name] is a 64 y.o. 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 He was taking aspirin and brilinta as of 4/28 and scheduled for an endovascular procedure to treat his RIGHT sided aneurysm 5/5, however, on 5/4 he presented with intermittent dysphagia, R hand numbness and L occipital HA. CT showed L frontoparietal SAH.  He was taken to IR for aneurysm securement -> s/p balloon assisted coiling.  The patient is being kept in the ICU to monitor for post hemorrhage vasospasms.  [name] on amlodipine.  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   Cerebral angio 4/16 - blister like aneurysm right ICA paraophthalmic segment 5.80mm. and left MCA bifurcation saccular aneurysm wide neck 4.22mm  Cerebral angio 5/4 - 4.57mm wide neck L MCA bifurcation aneurysm s/p balloon assisted coiling.   MRI Scattered SAH L sylvian fissure and L frontoparietal convexity stable. Probable few punctate L frontal and parietal lobe infarcts, other DWI abnormality d/t SAH. L MCA coil.  2D Echo EF 60-65%. No source of embolus   TCD MWF 5/5 no vasospasm   EEG no sz  LDL 149  HgbA1c 5.2   On nimodipine 60 mg q4h x 21 days  SCDs for VTE prophylaxis  aspirin 81 mg daily and Brilinta 90 daily prior to admission, on aspirin 81 mg daily. Stopped Brilinta.   Therapy recommendations:  No PT, OP PT - Outpt speech therapy rcommended  Disposition:  Plan return home (plan at least 21 days as inpatient due to possible vasospasm)  Blood Pressure  Home meds:  None, no hx HTN  Off Cleviprex  Labetalol PRN . SBP goal < 160 (BP is currently < than 150) . Long-term BP goal normotensive  Hyperlipidemia  Home meds:  No statin  LDL 149  On lipitor 40  Continue on discharge  Alcohol use  ETOH use, alcohol intake elevated lately  Advised to drink no more than 2  drink(s) a day.   Placed on CIWA protocol  (prn ativan)  FA/B1/MVI  Seizure precaution  Tobacco abuse  Current smoker  Smoking cessation counseling provided  Nicotine patch provided   Pt is willing to quit  Other Stroke Risk Factors  Family hx aneurysm (father)  Other Active Problems  Code status - Full code  Labs stable  Conductive hearing loss in left ear  Hospital day # 5      To contact Stroke Continuity provider, please refer to [email or web address]. After hours, contact General Neurology

## 2019-08-12 ENCOUNTER — Inpatient Hospital Stay (HOSPITAL_COMMUNITY): Payer: Managed Care, Other (non HMO)

## 2019-08-12 DIAGNOSIS — F1010 Alcohol abuse, uncomplicated: Secondary | ICD-10-CM

## 2019-08-12 DIAGNOSIS — Z720 Tobacco use: Secondary | ICD-10-CM

## 2019-08-12 DIAGNOSIS — I671 Cerebral aneurysm, nonruptured: Secondary | ICD-10-CM

## 2019-08-12 NOTE — Progress Notes (Addendum)
Referring Physician(s): Jaffe,Adam R  Supervising Physician: Baldemar Lenis  Patient Status:  [facility] - In-pt  Chief Complaint: Speech  Subjective:  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 Tommie Sams. Patient awake and alert sitting in bed undergoing TCD. Accompanied by wife at bedside. Still with expressive aphasia, continues to improve daily.   Allergies: Other  Medications: Prior to Admission medications   Medication Sig Start Date End Date Taking? Authorizing Provider  acetaminophen (TYLENOL) 500 MG tablet Take 1,000 mg by mouth every 6 (six) hours as needed for mild pain or headache.   Yes [provider]  aspirin 81 MG EC tablet Take 81 mg by mouth daily. Swallow whole.   Yes [provider]  ibuprofen (ADVIL) 200 MG tablet Take 400 mg by mouth every 6 (six) hours as needed for moderate pain.   Yes [provider]  ticagrelor (BRILINTA) 90 MG TABS tablet Take 90 mg by mouth daily.    Yes [provider]  diazepam (VALIUM) 5 MG tablet Take 1 tablet 30 mins prior to MRI. May take second dose if needed. Patient not taking: Reported on 07/17/2019 07/02/19   Van Clines, MD     Vital Signs: BP 131/87   Pulse 60   Temp 98.2 F (36.8 C) (Oral)   Resp 17   Ht 5\' 11"  (1.803 m)   Wt 190 lb (86.2 kg)   SpO2 100%   BMI 26.50 kg/m   Physical Exam Vitals and nursing note reviewed.  Constitutional:      General: [name] not in acute distress. Pulmonary:     Effort: Pulmonary effort is normal. No respiratory distress.  Skin:    General: Skin is warm and dry.  Neurological:     Mental Status: [name] alert.     Comments: Alert, awake, and oriented x3. Follows simple commands. Demonstrates expressive aphasia- continues to improve daily. Can spontaneously move all extremities. Distal  pulses (DPs) 2+ bilaterally.     Imaging: VAS TRANSCRANIAL DOPPLER  Result Date: 08/09/2019  Transcranial Doppler Indications: Subarachnoid hemorrhage. Comparison Study: 08/07/2019 Performing Technologist: 10/07/2019 RVT  Examination Guidelines: A complete evaluation includes B-mode imaging, spectral Doppler, color Doppler, and power Doppler as needed of all accessible portions of each vessel. Bilateral testing is considered an integral part of a complete examination. Limited examinations for reoccurring indications may be performed as noted.  +----------+-------------+----------+-----------+-------+ RIGHT TCD Right VM (cm)Depth (cm)PulsatilityComment +----------+-------------+----------+-----------+-------+ MCA           65.00                 1.21            +----------+-------------+----------+-----------+-------+ ACA          -34.00                 0.93            +----------+-------------+----------+-----------+-------+ Term ICA      64.00                 1.21            +----------+-------------+----------+-----------+-------+ PCA           33.00                 1.36            +----------+-------------+----------+-----------+-------+ Opthalmic  18.00                 1.49            +----------+-------------+----------+-----------+-------+ ICA siphon    21.00                 1.71            +----------+-------------+----------+-----------+-------+ Vertebral    -18.00                 0.92            +----------+-------------+----------+-----------+-------+  +----------+------------+----------+-----------+-------+ LEFT TCD  Left VM (cm)Depth (cm)PulsatilityComment +----------+------------+----------+-----------+-------+ MCA          39.00                 0.77            +----------+------------+----------+-----------+-------+ ACA          -47.00                  1             +----------+------------+----------+-----------+-------+  Term ICA     68.00                 1.02            +----------+------------+----------+-----------+-------+ PCA          49.00                 0.75            +----------+------------+----------+-----------+-------+ Opthalmic    22.00                 1.68            +----------+------------+----------+-----------+-------+ ICA siphon   28.00                 1.74            +----------+------------+----------+-----------+-------+ Vertebral    -27.00                1.01            +----------+------------+----------+-----------+-------+  +------------+-------+-------+             VM cm/sComment +------------+-------+-------+ Prox Basilar-24.00   0.7   +------------+-------+-------+ Dist Basilar-36.00   0.9   +------------+-------+-------+    Preliminary     Labs:  CBC: Recent Labs    08/08/19 0219 08/09/19 0916 08/10/19 0559 08/11/19 0628  WBC 7.7 7.4 7.2 7.5  HGB 11.9* 13.2 13.0 13.6  HCT 35.6* 40.0 38.4* 39.9  PLT 246 269 296 338    COAGS: Recent Labs    07/15/19 1344 08/06/19 1217 08/06/19 1244  INR 1.0 1.0 1.0  APTT 29 27 28     BMP: Recent Labs    08/08/19 0219 08/09/19 0916 08/10/19 0559 08/11/19 0628  NA 139 138 139 140  K 4.2 3.7 4.2 3.7  CL 107 106 105 108  CO2 25 22 25  21*  GLUCOSE 105* 104* 101* 99  BUN 15 15 20 15   CALCIUM 8.6* 9.0 9.2 9.2  CREATININE 0.86 0.77 0.96 0.73  GFRNONAA >60 >60 >60 >60  GFRAA >60 >60 >60 >60    LIVER FUNCTION TESTS: Recent Labs    08/06/19 1217  BILITOT 0.9  AST 49*  ALT 46*  ALKPHOS 60  PROT 7.4  ALBUMIN 4.0    Assessment and Plan:  History of subarachnoid hemorrhage secondary to ruptured left MCA bifurcation  aneurysms/p cerebral arteriogram with emergent balloon assisted coiling 08/06/2019 by Dr. Tommie Sams. Patient's condition continues to improve- demonstrates expressive aphasia that continues to improve, no additional neurologic symptoms noted. Patient and wife had  questions regarding hospital plans- all questions answered and concerns addressed. Plan for TCD today to evaluate for vasospasm. Continue speech therapy. Continue taking Aspirin 81 mg once daily. Patient to transfer to [facility] service at this time with [facility] consulting- appreciate and agree with management. Ok to transfer to progressive floor at this time per Dr. Tommie Sams- order placed. NIR to follow.   Electronically Signed: Elwin Mocha, PA-C 08/12/2019, 11:52 AM   I spent a total of 25 Minutes at the the patient's bedside AND on the patient's hospital floor or unit, greater than 50% of which was counseling/coordinating care for ruptured left MCA bifurcation aneurysm s/p coiling.

## 2019-08-12 NOTE — Progress Notes (Signed)
Transcranial Doppler  Date POD PCO2 HCT BP  MCA ACA PCA OPHT SIPH VERT Basilar  5/5     Right  Left                                       5/7     Right  Left                                       5/10     Right  Left   69  44   -45  -47   28  34   23  26   *  37   -15  -23   -26            Right  Left                                             Right  Left                                            Right  Left                                            Right  Left                                        MCA = Middle Cerebral Artery      OPHT = Opthalmic Artery     BASILAR = Basilar Artery   ACA = Anterior Cerebral Artery     SIPH = Carotid Siphon PCA = Posterior Cerebral Artery   VERT = Verterbral Artery                   Normal MCA = 62+\-12 ACA = 50+\-12 PCA = 42+\-23

## 2019-08-12 NOTE — Progress Notes (Signed)
Occupational Therapy Treatment Patient Details Name: Victor Pena MRN: [id number] DOB: 1956-01-16 Today's Date: 08/12/2019    History of present illness 64 y.o. male with a history of intracranial aneurysms and diplopia.  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  He underwent a diagnostic cerebral angiogram on 07/19/2019 that showed a 4.4 x 4.2 mm wide neck left MCA bifurcation aneurysm and a blisterlike right ICA bifurcation aneurysm measuring 5.6 x 3 0.1 mm with superimposed pseudolobation. Pt presented to ED with intermittent dysphasia, HA, and difficulty utilizing R hand. CT revealed SAH. Pt now s/p coiling.   OT comments  Pt with apparent sensorimotor deficits with R hand, decreasing fine motor/coordination and in-hand manipulation skills. 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 Session focused on therapeutic activities to address coordination and in-hand manipulation skills. Continue to recommend outpt OT for follow up to facilitate successful return to work. Will continue to follow acutely.   Follow Up Recommendations  Outpatient OT    Equipment Recommendations  None recommended by OT    Recommendations for Other Services Speech consult    Precautions / Restrictions Precautions Precautions: None       Mobility Bed Mobility Overal bed mobility: Independent                Transfers Overall transfer level: Independent                    Balance Overall balance assessment: No apparent balance deficits (not formally assessed)                                         ADL either performed or assessed with clinical judgement   ADL   Eating/Feeding: Independent   Grooming: Modified independent;Standing                   Toilet Transfer: Modified Independent   Toileting- Clothing Manipulation and Hygiene: Modified independent       Functional mobility  during ADLs: Modified independent       Vision       Perception     Praxis      Cognition Arousal/Alertness: Awake/alert Behavior During Therapy: WFL for tasks assessed/performed Overall Cognitive Status: Within Functional Limits for tasks assessed                                          Exercises     Shoulder Instructions       General Comments Focus of session on Therapeutic activities for R hand to address in-hand manipulation skills and fine motor control/coordination. 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 Pt educated on coin activity to address translation of coins from palm to tips of digits while holding coins in hand. Pt giving cup flipping activity with smal medicine cups. Also issued adult coloring book with markers to work on [employer] dn coordination. Pt giving tracing activities to complete.  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" OT Goal Formulation: With patient Time For Goal Achievement: 08/21/19 Potential to Achieve Goals: Good ADL Goals Pt Will Perform Eating: with modified independence;with adaptive utensils;sitting Pt Will Perform Grooming: with modified independence;sitting Pt/caregiver will Perform Home Exercise Program: Right Upper extremity;With written HEP provided  Plan Discharge plan remains appropriate    Co-evaluation                 AM-PAC OT "6 Clicks" Daily Activity     Outcome Measure   Help from another person eating meals?: None Help from another  person taking care of personal grooming?: None Help from another person toileting, which includes using toliet, bedpan, or urinal?: A Little Help from another person bathing (including washing, rinsing, drying)?: A Little Help from another person to put on and taking off regular upper body clothing?: A Little Help from another person to put on and taking off regular lower body clothing?: A Little 6 Click Score: 20    End of Session    OT Visit Diagnosis: Unsteadiness on feet (R26.81);Muscle weakness (generalized) (M62.81)   Activity Tolerance Patient tolerated treatment well   Patient Left in chair;with call bell/phone within reach;with family/visitor present   Nurse Communication Other (comment)(activities to complete in room)        Time: 1637-1700 OT Time Calculation (min): 23 min  Charges: OT General Charges $OT Visit: 1 Visit OT Treatments $Therapeutic Activity: 23-37 mins  Maurie Boettcher, OT/L   Acute OT Clinical Specialist [place] Pager [phone] Office [phone]    [facility] 08/12/2019, 6:02 PM

## 2019-08-12 NOTE — Plan of Care (Signed)
  Problem: Health Behavior/Discharge Planning: Goal: Ability to manage health-related needs will improve Outcome: Progressing   

## 2019-08-12 NOTE — Progress Notes (Signed)
STROKE TEAM PROGRESS NOTE   INTERVAL HISTORY Patient is sitting comfortably in bedside chair.  He states his speech is improved almost back to baseline though he still has some word hesitancy.  He complains of only mild headache which is not bothersome.  Transcranial Doppler studies done today as well as on 2022/08/10 were both normal without evidence of any vasospasm.  Vitals:   08/12/19 0500 08/12/19 0700 08/12/19 0800 08/12/19 0900  BP: 111/66 (!) 140/91 127/83 131/87  Pulse:      Resp: 15 20 (!) 22 17  Temp:   98.2 F (36.8 C)   TempSrc:   Oral   SpO2: 99% 98% 98% 100%  Weight:      Height:        CBC:  Recent Labs  Lab 08/06/19 1217 08/06/19 1226 08/10/19 0559 08/11/19 0628  WBC 7.7   < > 7.2 7.5  NEUTROABS 5.7  --   --   --   HGB 13.9   < > 13.0 13.6  HCT 41.4   < > 38.4* 39.9  MCV 106.4*   < > 104.6* 103.6*  PLT 264   < > 296 338   < > = values in this interval not displayed.    Basic Metabolic Panel:  Recent Labs  Lab 08/08/19 0219 08/09/19 0916 08/10/19 0559 08/11/19 0628  NA 139   < > 139 140  K 4.2   < > 4.2 3.7  CL 107   < > 105 108  CO2 25   < > 25 21*  GLUCOSE 105*   < > 101* 99  BUN 15   < > 20 15  CREATININE 0.86   < > 0.96 0.73  CALCIUM 8.6*   < > 9.2 9.2  MG 1.8  --   --   --   PHOS 2.3*  --   --   --    < > = values in this interval not displayed.    IMAGING past 24 hours No results found.   PHYSICAL EXAM  General -pleasant middle-age Caucasian male, in no apparent distress.  Ophthalmologic - fundi not visualized due to noncooperation.  Cardiovascular - Regular rhythm and rate.  Mental Status -  Level of arousal and orientation to time, place, and person were intact. Language exam showed mild expressive aphasia, still significant hesitation, able to name and slow repetition. Comprehension intact. Attention span and concentration were normal. Fund of Knowledge was assessed and was intact.  Cranial Nerves II - XII - II - Visual field  intact OU. III, IV, VI - Extraocular movements intact. V - Facial sensation intact bilaterally. VII -.  No facial droop noted today.   VIII - Hearing & vestibular intact bilaterally. X - Palate elevates symmetrically. XI - Chin turning & shoulder shrug intact bilaterally. XII - Tongue protrusion intact.  Motor Strength - The patient's strength was normal in all extremities and pronator drift was absent mild right grip weakness..  Bulk was normal and fasciculations were absent.   Motor Tone - Muscle tone was assessed at the neck and appendages and was normal.  Diminished fine finger movements on the right compared to the left. Reflexes - The patient's reflexes were symmetrical in all extremities and he had no pathological reflexes.  Sensory - Light touch, temperature/pinprick were assessed and were symmetrical.    Coordination - The patient had normal movements in the hands with no ataxia or dysmetria.  Tremor was absent.  Gait and  Station - deferred.   ASSESSMENT/PLAN [name] is a 64 y.o. 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 He was taking aspirin and brilinta as of 4/28 and scheduled for an endovascular procedure to treat his RIGHT sided aneurysm 5/5, however, on 5/4 he presented with intermittent dysphagia, R hand numbness and L occipital HA. CT showed L frontoparietal SAH.  He was taken to IR for aneurysm securement -> s/p balloon assisted coiling.  The patient is being kept in the ICU to monitor for post hemorrhage vasospasms.  [name] on amlodipine.  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   Cerebral angio 4/16 - blister like aneurysm right ICA paraophthalmic segment 5.59mm. and left MCA bifurcation saccular aneurysm wide neck 4.77mm  Cerebral angio 5/4 - 4.79mm wide neck L MCA  bifurcation aneurysm s/p balloon assisted coiling.   MRI Scattered SAH L sylvian fissure and L frontoparietal convexity stable. Probable few punctate L frontal and parietal lobe infarcts, other DWI abnormality d/t SAH. L MCA coil.  2D Echo EF 60-65%. No source of embolus   TCD MWF 5/5 no vasospasm; 5/7 normal; 5/10 normal  EEG no sz  LDL 149  HgbA1c 5.2   On nimodipine 60 mg q4h x 21 days  SCDs for VTE prophylaxis  aspirin 81 mg daily and Brilinta 90 daily prior to admission, on aspirin 81 mg daily. Stopped Brilinta.   Therapy recommendations:  No PT, OP PT - OP SLP  Disposition:  return home   Transfer to the floor  ? Need for 2wk IP stay - Dr. Leonie Man to address w/ Dr.  Norma Fredrickson  Follow up Dr. Tomi Likens  Blood Pressure  Home meds:  None, no hx HTN  Off Cleviprex  Labetalol PRN . SBP goal < 160  . Long-term BP goal normotensive  Hyperlipidemia  Home meds:  No statin  LDL 149  On lipitor 40  Continue on discharge  Alcohol use  ETOH use, alcohol intake elevated lately  Advised to drink no more than 2 drink(s) a day.   Placed on CIWA protocol  (prn ativan)  FA/B1/MVI  Seizure precaution  Tobacco abuse  Current smoker  Smoking cessation counseling provided  Nicotine patch provided   Pt is willing to quit  Other Stroke Risk Factors  Family hx aneurysm (father)  Other Active Problems  Code status - Full code  Conductive hearing loss in left ear  Hospital day # 6 Patient presented with headache and some aphasia and right-sided weakness secondary to sentinel leak from left MCA bifurcation aneurysm and underwent emergent successful coiling.  He remains at risk for vasospasm though last 3 TCD studies have been normal.  Continue aspirin 81 mg daily for stroke prevention and aggressive risk factor modification.  Transfer patient out to neurology's progressive care unit.  Discussed with patient and wife and with Dr. Norma Fredrickson and answered questions.   Greater than 50% time during this 35-minute visit was spent on counseling and coordination of care and discussion with care team Venia Minks, MD To contact Stroke Continuity provider, please refer to [email or web address] After hours, contact General Neurology

## 2019-08-12 NOTE — Consult Note (Addendum)
[facility] Medical Consultation  [name]:[id number] DOB: 02/19/1956 DOA: 08/06/2019 PCP: Patient, No Pcp Per   Requesting physician: Tilden Dome Date of consultation: 08/12/2019 Reason for consultation: Medical management  Impression/Recommendations  1. Aneurysmal subarachnoid hemorrhage: Patient status post emergent coiling on 5/4.  Monitoring with transcranial Dopplers (Day 7 of 14).   -Continue aspirin 81 mg daily and Brilinta was stopped. -Daily transcranial Doppler -Per IR  2. Hyperlipidemia: LDL was noted to be 149.  Goal less than 70. -Continue Lipitor  3. Tobacco abuse -Continue nicotine patch  4. Alcohol use: Patient did report increased alcohol use recently.  -Continue CIWA protocol  I will followup again tomorrow. Please contact me if I can be of assistance in the meanwhile. Thank you for this consultation.  Chief Complaint: Difficulty speaking and right hand numbness/weakness  HPI:  Victor Pena is a 64 y.o. male with medical history significant of intracranial aneurysm who initially presented with complaints of difficulty speaking with right hand weakness and numbness.  CT scan of the brain revealed a subarachnoid bleed or patient was noted to have a left MCA aneurysm.  IR was consulted and performed cerebral arteriogram with emergent balloon assisted coiling on 5/4 by Dr. Norma Fredrickson.  Since that time patient expressive aphasia has continued to improve daily.  Patient is to remain in hospital for total of 14 days to monitor for vasospasm with daily transcranial Dopplers.    Review of Systems  Neurological: Positive for speech change and focal weakness.    Past Medical History:  Diagnosis Date  . Diplopia   . Intracranial aneurysm    Past Surgical History:  Procedure Laterality Date  . BACK SURGERY    . IR 3D INDEPENDENT WKST  07/19/2019  . IR 3D INDEPENDENT WKST  07/19/2019  . IR 3D INDEPENDENT WKST  08/06/2019  . IR ANGIO EXTERNAL CAROTID  SEL EXT CAROTID BILAT MOD SED  07/19/2019  . IR ANGIO INTRA EXTRACRAN SEL INTERNAL CAROTID BILAT MOD SED  07/19/2019  . IR ANGIO VERTEBRAL SEL VERTEBRAL BILAT MOD SED  07/19/2019  . IR ANGIOGRAM FOLLOW UP STUDY  08/06/2019  . IR ANGIOGRAM FOLLOW UP STUDY  08/06/2019  . IR ANGIOGRAM FOLLOW UP STUDY  08/06/2019  . IR ANGIOGRAM FOLLOW UP STUDY  08/06/2019  . IR ANGIOGRAM FOLLOW UP STUDY  08/06/2019  . IR ANGIOGRAM FOLLOW UP STUDY  08/06/2019  . IR CT HEAD LTD  08/06/2019  . IR NEURO EACH ADD'L AFTER BASIC UNI LEFT (MS)  08/06/2019  . IR TRANSCATH/EMBOLIZ  08/06/2019  . IR US GUIDE VASC ACCESS RIGHT  07/19/2019  . IR US GUIDE VASC ACCESS RIGHT  08/06/2019  . RADIOLOGY WITH ANESTHESIA N/A 08/06/2019   Procedure: IR WITH ANESTHESIA;  Surgeon: Radiologist, Medication, MD;  Location: [facility];  Service: Radiology;  Laterality: N/A;   Social History:  reports that he has been smoking. He has never used smokeless tobacco. He reports current alcohol use of about 10.0 standard drinks of alcohol per week. No history on file for drug.  Allergies  Allergen Reactions  . Other     seasonal   Family History  Problem Relation Age of Onset  . Aneurysm Father     Prior to Admission medications   Medication Sig Start Date End Date Taking? Authorizing Provider  acetaminophen (TYLENOL) 500 MG tablet Take 1,000 mg by mouth every 6 (six) hours as needed for mild pain or headache.   Yes [provider]  aspirin 81  MG EC tablet Take 81 mg by mouth daily. Swallow whole.   Yes [provider]  ibuprofen (ADVIL) 200 MG tablet Take 400 mg by mouth every 6 (six) hours as needed for moderate pain.   Yes [provider]  ticagrelor (BRILINTA) 90 MG TABS tablet Take 90 mg by mouth daily.    Yes [provider]  diazepam (VALIUM) 5 MG tablet Take 1 tablet 30 mins prior to MRI. May take second dose if needed. Patient not taking: Reported on 07/17/2019 07/02/19   Van Clines, MD   Physical  Exam:  Constitutional: Appears to be in no acute distress sitting in chair Vitals:   08/12/19 1200 08/12/19 1310 08/12/19 1500 08/12/19 1600  BP:  114/77 126/74 111/60  Pulse:   68   Resp: 20  14 16   Temp: 97.9 F (36.6 C)   98 F (36.7 C)  TempSrc: Oral   Oral  SpO2: 99%  96% 100%  Weight:      Height:       Eyes: PERRL, lids and conjunctivae normal ENMT: Mucous membranes are moist. Posterior pharynx clear of any exudate or lesions.  Neck: normal, supple, no masses, no thyromegaly Respiratory: clear to auscultation bilaterally, no wheezing, no crackles. Normal respiratory effort. No accessory muscle use.  Cardiovascular: Regular rate and rhythm, no murmurs / rubs / gallops. No extremity edema. 2+ pedal pulses. No carotid bruits.  Abdomen: no tenderness, no masses palpated. No hepatosplenomegaly. Bowel sounds positive.  Musculoskeletal: no clubbing / cyanosis. No joint deformity upper and lower extremities. Good ROM, no contractures. Normal muscle tone.  Skin: no rashes, lesions, ulcers. No induration Neurologic: CN 2-12 grossly intact. Sensation intact, DTR normal. Strength 5/5 in all 4.  Dysarthria appreciated Psychiatric: Normal judgment and insight. Alert and oriented x 3. Normal mood.   Labs on Admission:  Basic Metabolic Panel: Recent Labs  Lab 08/07/19 0631 08/08/19 0219 08/09/19 0916 08/10/19 0559 08/11/19 0628  NA 136 139 138 139 140  K 3.9 4.2 3.7 4.2 3.7  CL 104 107 106 105 108  CO2 17* 25 22 25  21*  GLUCOSE 114* 105* 104* 101* 99  BUN 15 15 15 20 15   CREATININE 0.80 0.86 0.77 0.96 0.73  CALCIUM 8.1* 8.6* 9.0 9.2 9.2  MG  --  1.8  --   --   --   PHOS  --  2.3*  --   --   --    Liver Function Tests: Recent Labs  Lab 08/06/19 1217  AST 49*  ALT 46*  ALKPHOS 60  BILITOT 0.9  PROT 7.4  ALBUMIN 4.0   No results for input(s): LIPASE, AMYLASE in the last 168 hours. No results for input(s): AMMONIA in the last 168 hours. CBC: Recent Labs  Lab  08/06/19 1217 08/06/19 1226 08/07/19 0631 08/08/19 0219 08/09/19 0916 08/10/19 0559 08/11/19 0628  WBC 7.7   < > 9.9 7.7 7.4 7.2 7.5  NEUTROABS 5.7  --   --   --   --   --   --   HGB 13.9   < > 12.2* 11.9* 13.2 13.0 13.6  HCT 41.4   < > 36.0* 35.6* 40.0 38.4* 39.9  MCV 106.4*   < > 105.6* 106.6* 106.1* 104.6* 103.6*  PLT 264   < > 239 246 269 296 338   < > = values in this interval not displayed.   Cardiac Enzymes: No results for input(s): CKTOTAL, CKMB, CKMBINDEX, TROPONINI in the  last 168 hours. BNP: Invalid input(s): POCBNP CBG: Recent Labs  Lab 08/06/19 1219  GLUCAP 109*    Radiological Exams on Admission: VAS [place] TRANSCRANIAL DOPPLER  Result Date: 08/12/2019  Transcranial Doppler Indications: Subarachnoid hemorrhage. Performing Technologist: Jeb Levering RDMS, RVT  Examination Guidelines: A complete evaluation includes B-mode imaging, spectral Doppler, color Doppler, and power Doppler as needed of all accessible portions of each vessel. Bilateral testing is considered an integral part of a complete examination. Limited examinations for reoccurring indications may be performed as noted.  +----------+-------------+----------+-----------+--------------+ RIGHT TCD Right VM (cm)Depth (cm)Pulsatility   Comment     +----------+-------------+----------+-----------+--------------+ MCA           69.00                 1.33                   +----------+-------------+----------+-----------+--------------+ ACA          -45.00                 1.56                   +----------+-------------+----------+-----------+--------------+ Term ICA      37.00                 1.63                   +----------+-------------+----------+-----------+--------------+ PCA           28.00                 0.82                   +----------+-------------+----------+-----------+--------------+ Opthalmic     23.00                  0.7                    +----------+-------------+----------+-----------+--------------+ ICA siphon                                  not visualized +----------+-------------+----------+-----------+--------------+ Vertebral    -15.00                  0.9                   +----------+-------------+----------+-----------+--------------+  +----------+------------+----------+-----------+-------+ LEFT TCD  Left VM (cm)Depth (cm)PulsatilityComment +----------+------------+----------+-----------+-------+ MCA          44.00                 1.38            +----------+------------+----------+-----------+-------+ ACA          -47.00                1.19            +----------+------------+----------+-----------+-------+ Term ICA     40.00                 1.09            +----------+------------+----------+-----------+-------+ PCA          34.00                 1.27            +----------+------------+----------+-----------+-------+ Opthalmic    26.00  1.22            +----------+------------+----------+-----------+-------+ ICA siphon   37.00                 1.13            +----------+------------+----------+-----------+-------+ Vertebral    -23.00                1.19            +----------+------------+----------+-----------+-------+  +------------+-------+-------+             VM cm/sComment +------------+-------+-------+ Prox Basilar-26.00         +------------+-------+-------+ Dist Basilar-21.00         +------------+-------+-------+ Summary: This was a normal transcranial Doppler study, with normal flow direction and velocity of all identified vessels of the anterior and posterior circulations, with no evidence of stenosis, vasospasm or occlusion. There was no evidence of intracranial disease.  *See table(s) above for TCD measurements and observations.  Diagnosing physician: Delia Heady MD Electronically signed by Delia Heady MD on 08/12/2019 at 2:04:24 PM.     Final       Time spent: >45 minutes  Gurpreet Mikhail A Oluwasemilore Bahl [facility] Pager [phone]  If 7PM-7AM, please contact night-coverage [email or web address] Password [email or web address] 08/12/2019, 5:13 PM

## 2019-08-13 NOTE — Progress Notes (Signed)
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 Tommie Sams. Patient awake and alert sitting in bed. Accompanied by daughter at bedside. Complains of right arm numbness (elbow to fingers) that began last evening. States it has slightly improved but is still present. States it is similar to his prior episode of numbness of this extremity (seen on admission). Hesitant with speech.   Allergies: Other  Medications: Prior to Admission medications   Medication Sig Start Date End Date Taking? Authorizing Provider  acetaminophen (TYLENOL) 500 MG tablet Take 1,000 mg by mouth every 6 (six) hours as needed for mild pain or headache.   Yes [provider]  aspirin 81 MG EC tablet Take 81 mg by mouth daily. Swallow whole.   Yes [provider]  ibuprofen (ADVIL) 200 MG tablet Take 400 mg by mouth every 6 (six) hours as needed for moderate pain.   Yes [provider]  ticagrelor (BRILINTA) 90 MG TABS tablet Take 90 mg by mouth daily.    Yes [provider]  diazepam (VALIUM) 5 MG tablet Take 1 tablet 30 mins prior to MRI. May take second dose if needed. Patient not taking: Reported on 07/17/2019 07/02/19   Van Clines, MD     Vital Signs: BP 121/78 (BP Location: Right Arm)   Pulse (!) 59   Temp 97.7 F (36.5 C) (Oral)   Resp 19   Ht 5\' 11"  (1.803 m)   Wt 190 lb (86.2 kg)   SpO2 92%   BMI 26.50 kg/m   Physical Exam Vitals and nursing note reviewed.  Constitutional:      General: [name] not in acute distress. Pulmonary:     Effort: Pulmonary effort is normal. No respiratory distress.  Skin:    General: Skin is warm and dry.  Neurological:   Mental Status: [name] alert.     Comments: Alert, awake, and oriented x3. Follows simple commands. Hesitant with speech but expressive aphasia improving. PERRL bilaterally. EOMs intact bilaterally without nystagmus or subjective diplopia. Mild right facial droop. Tongue midline. Shoulder shrug intact and symmetric bilaterally. Hand grip 4+/5 on right and 5/5 on left, lower extremity strength 5/5 bilaterally. Facial and lower extremity sensation intact and equal bilaterally; patient reports decreased sensation of right forearm (anterior/palmar side) compared to left forearm.     Imaging: VAS TRANSCRANIAL DOPPLER  Result Date: 08/12/2019  Transcranial Doppler Indications: Subarachnoid hemorrhage. Comparison Study: 08/07/2019 Performing Technologist: 10/07/2019 RVT  Examination Guidelines: A complete evaluation includes B-mode imaging, spectral Doppler, color Doppler, and power Doppler as needed of all accessible portions of each vessel. Bilateral testing is considered an integral part of a complete examination. Limited examinations for reoccurring indications may be performed as noted.  +----------+-------------+----------+-----------+-------+ RIGHT TCD Right VM (cm)Depth (cm)PulsatilityComment +----------+-------------+----------+-----------+-------+ MCA           65.00                 1.21            +----------+-------------+----------+-----------+-------+ ACA          -34.00                 0.93            +----------+-------------+----------+-----------+-------+  Term ICA      64.00                 1.21            +----------+-------------+----------+-----------+-------+ PCA           33.00                 1.36            +----------+-------------+----------+-----------+-------+ Opthalmic     18.00                 1.49            +----------+-------------+----------+-----------+-------+ ICA siphon    21.00                 1.71             +----------+-------------+----------+-----------+-------+ Vertebral    -18.00                 0.92            +----------+-------------+----------+-----------+-------+  +----------+------------+----------+-----------+-------+ LEFT TCD  Left VM (cm)Depth (cm)PulsatilityComment +----------+------------+----------+-----------+-------+ MCA          39.00                 0.77            +----------+------------+----------+-----------+-------+ ACA          -47.00                  1             +----------+------------+----------+-----------+-------+ Term ICA     68.00                 1.02            +----------+------------+----------+-----------+-------+ PCA          49.00                 0.75            +----------+------------+----------+-----------+-------+ Opthalmic    22.00                 1.68            +----------+------------+----------+-----------+-------+ ICA siphon   28.00                 1.74            +----------+------------+----------+-----------+-------+ Vertebral    -27.00                1.01            +----------+------------+----------+-----------+-------+  +------------+-------+-------+             VM cm/sComment +------------+-------+-------+ Prox Basilar-24.00   0.7   +------------+-------+-------+ Dist Basilar-36.00   0.9   +------------+-------+-------+ Summary: This was a normal transcranial Doppler study, with normal flow direction and velocity of all identified vessels of the anterior and posterior circulations, with no evidence of stenosis, vasospasm or occlusion. There was no evidence of intracranial disease.  *See table(s) above for TCD measurements and observations.  Diagnosing physician: Delia Heady MD Electronically signed by Delia Heady MD on 08/12/2019 at 2:11:29 PM.    Final    VAS [place] TRANSCRANIAL DOPPLER  Result Date: 08/12/2019  Transcranial Doppler Indications: Subarachnoid hemorrhage. Performing Technologist:  Jeb Levering RDMS, RVT  Examination Guidelines: A complete evaluation includes B-mode imaging, spectral Doppler, color Doppler, and power Doppler as needed of all accessible portions  of each vessel. Bilateral testing is considered an integral part of a complete examination. Limited examinations for reoccurring indications may be performed as noted.  +----------+-------------+----------+-----------+--------------+ RIGHT TCD Right VM (cm)Depth (cm)Pulsatility   Comment     +----------+-------------+----------+-----------+--------------+ MCA           69.00                 1.33                   +----------+-------------+----------+-----------+--------------+ ACA          -45.00                 1.56                   +----------+-------------+----------+-----------+--------------+ Term ICA      37.00                 1.63                   +----------+-------------+----------+-----------+--------------+ PCA           28.00                 0.82                   +----------+-------------+----------+-----------+--------------+ Opthalmic     23.00                  0.7                   +----------+-------------+----------+-----------+--------------+ ICA siphon                                  not visualized +----------+-------------+----------+-----------+--------------+ Vertebral    -15.00                  0.9                   +----------+-------------+----------+-----------+--------------+  +----------+------------+----------+-----------+-------+ LEFT TCD  Left VM (cm)Depth (cm)PulsatilityComment +----------+------------+----------+-----------+-------+ MCA          44.00                 1.38            +----------+------------+----------+-----------+-------+ ACA          -47.00                1.19            +----------+------------+----------+-----------+-------+ Term ICA     40.00                 1.09             +----------+------------+----------+-----------+-------+ PCA          34.00                 1.27            +----------+------------+----------+-----------+-------+ Opthalmic    26.00                 1.22            +----------+------------+----------+-----------+-------+ ICA siphon   37.00                 1.13            +----------+------------+----------+-----------+-------+ Vertebral    -23.00  1.19            +----------+------------+----------+-----------+-------+  +------------+-------+-------+             VM cm/sComment +------------+-------+-------+ Prox Basilar-26.00         +------------+-------+-------+ Dist Basilar-21.00         +------------+-------+-------+ Summary: This was a normal transcranial Doppler study, with normal flow direction and velocity of all identified vessels of the anterior and posterior circulations, with no evidence of stenosis, vasospasm or occlusion. There was no evidence of intracranial disease.  *See table(s) above for TCD measurements and observations.  Diagnosing physician: Delia Heady MD Electronically signed by Delia Heady MD on 08/12/2019 at 2:04:24 PM.    Final     Labs:  CBC: Recent Labs    08/08/19 0219 08/09/19 0916 08/10/19 0559 08/11/19 0628  WBC 7.7 7.4 7.2 7.5  HGB 11.9* 13.2 13.0 13.6  HCT 35.6* 40.0 38.4* 39.9  PLT 246 269 296 338    COAGS: Recent Labs    07/15/19 1344 08/06/19 1217 08/06/19 1244  INR 1.0 1.0 1.0  APTT 29 27 28     BMP: Recent Labs    08/08/19 0219 08/09/19 0916 08/10/19 0559 08/11/19 0628  NA 139 138 139 140  K 4.2 3.7 4.2 3.7  CL 107 106 105 108  CO2 25 22 25  21*  GLUCOSE 105* 104* 101* 99  BUN 15 15 20 15   CALCIUM 8.6* 9.0 9.2 9.2  CREATININE 0.86 0.77 0.96 0.73  GFRNONAA >60 >60 >60 >60  GFRAA >60 >60 >60 >60    LIVER FUNCTION TESTS: Recent Labs    08/06/19 1217  BILITOT 0.9  AST 49*  ALT 46*  ALKPHOS 60  PROT 7.4  ALBUMIN 4.0     Assessment and Plan:  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 . Discussed TCD result from yesterday- no evidence of vasospasm. Plan for repeat TCD tomorrow, order placed. Discussed RUE numbness- instructed patient to call RN if this worsens/changes (could be a sign of vasospasm). Continue Nimodipine for BP control. Continue taking Aspirin 81 mg once daily. For transfer to progressive unit pending bed availability. Alcohol/tobacco abuse- on CIWA protocol, Nicotine patch in use. Appreciate PT/OT/speech evaluations- continue with SLP/OT outpatient per recommendations. Further plans per [facility]- appreciate assistance with patient. NIR to follow.   Electronically Signed: 10/06/2019, PA-C 08/13/2019, 10:27 AM   I spent a total of 35 Minutes at the the patient's bedside AND on the patient's hospital floor or unit, greater than 50% of which was counseling/coordinating care for ruptured left MCA bifurcation aneurysm s/p coiling.

## 2019-08-13 NOTE — Progress Notes (Signed)
STROKE TEAM PROGRESS NOTE   INTERVAL HISTORY Wife at the bedside. He complains of a mild HA relieved by tylenol.  Also complains of recurrence of right arm numbness similar to his initial presentation which is improving but not recovered.  This is not bothersome.  Transferring pt to IR doc and will sign off. Available as needed.  Discussed with patient and daughter at the bedside and answered questions  Vitals:   08/13/19 0500 08/13/19 0600 08/13/19 0700 08/13/19 0800  BP:      Pulse:      Resp: 13  19   Temp:    97.7 F (36.5 C)  TempSrc:    Oral  SpO2: 99% 100% 92%   Weight:      Height:        CBC:  Recent Labs  Lab 08/06/19 1217 08/06/19 1226 08/10/19 0559 08/11/19 0628  WBC 7.7   < > 7.2 7.5  NEUTROABS 5.7  --   --   --   HGB 13.9   < > 13.0 13.6  HCT 41.4   < > 38.4* 39.9  MCV 106.4*   < > 104.6* 103.6*  PLT 264   < > 296 338   < > = values in this interval not displayed.    Basic Metabolic Panel:  Recent Labs  Lab 08/08/19 0219 08/09/19 0916 08/10/19 0559 08/11/19 0628  NA 139   < > 139 140  K 4.2   < > 4.2 3.7  CL 107   < > 105 108  CO2 25   < > 25 21*  GLUCOSE 105*   < > 101* 99  BUN 15   < > 20 15  CREATININE 0.86   < > 0.96 0.73  CALCIUM 8.6*   < > 9.2 9.2  MG 1.8  --   --   --   PHOS 2.3*  --   --   --    < > = values in this interval not displayed.    IMAGING past 24 hours VAS [place] TRANSCRANIAL DOPPLER  Result Date: 08/12/2019  Transcranial Doppler Indications: Subarachnoid hemorrhage. Performing Technologist: Jeb Levering RDMS, RVT  Examination Guidelines: A complete evaluation includes B-mode imaging, spectral Doppler, color Doppler, and power Doppler as needed of all accessible portions of each vessel. Bilateral testing is considered an integral part of a complete examination. Limited examinations for reoccurring indications may be performed as noted.  +----------+-------------+----------+-----------+--------------+ RIGHT TCD Right VM (cm)Depth  (cm)Pulsatility   Comment     +----------+-------------+----------+-----------+--------------+ MCA           69.00                 1.33                   +----------+-------------+----------+-----------+--------------+ ACA          -45.00                 1.56                   +----------+-------------+----------+-----------+--------------+ Term ICA      37.00                 1.63                   +----------+-------------+----------+-----------+--------------+ PCA           28.00  0.82                   +----------+-------------+----------+-----------+--------------+ Opthalmic     23.00                  0.7                   +----------+-------------+----------+-----------+--------------+ ICA siphon                                  not visualized +----------+-------------+----------+-----------+--------------+ Vertebral    -15.00                  0.9                   +----------+-------------+----------+-----------+--------------+  +----------+------------+----------+-----------+-------+ LEFT TCD  Left VM (cm)Depth (cm)PulsatilityComment +----------+------------+----------+-----------+-------+ MCA          44.00                 1.38            +----------+------------+----------+-----------+-------+ ACA          -47.00                1.19            +----------+------------+----------+-----------+-------+ Term ICA     40.00                 1.09            +----------+------------+----------+-----------+-------+ PCA          34.00                 1.27            +----------+------------+----------+-----------+-------+ Opthalmic    26.00                 1.22            +----------+------------+----------+-----------+-------+ ICA siphon   37.00                 1.13            +----------+------------+----------+-----------+-------+ Vertebral    -23.00                1.19             +----------+------------+----------+-----------+-------+  +------------+-------+-------+             VM cm/sComment +------------+-------+-------+ Prox Basilar-26.00         +------------+-------+-------+ Dist Basilar-21.00         +------------+-------+-------+ Summary: This was a normal transcranial Doppler study, with normal flow direction and velocity of all identified vessels of the anterior and posterior circulations, with no evidence of stenosis, vasospasm or occlusion. There was no evidence of intracranial disease.  *See table(s) above for TCD measurements and observations.  Diagnosing physician: Antony Contras MD Electronically signed by Antony Contras MD on 08/12/2019 at 2:04:24 PM.    Final      PHYSICAL EXAM   General -pleasant middle-age Caucasian male, in no apparent distress.  Ophthalmologic - fundi not visualized due to noncooperation.  Cardiovascular - Regular rhythm and rate.  Mental Status -  Level of arousal and orientation to time, place, and person were intact. Language exam showed mild expressive aphasia, still significant hesitation, able to name and slow repetition. Comprehension intact. Attention span and concentration were normal. Fund of Knowledge was assessed and was intact.  Cranial Nerves II - XII - II - Visual field intact OU. III, IV, VI - Extraocular movements intact. V - Facial sensation intact bilaterally. VII -.  No facial droop noted today.   VIII - Hearing & vestibular intact bilaterally. X - Palate elevates symmetrically. XI - Chin turning & shoulder shrug intact bilaterally. XII - Tongue protrusion intact.  Motor Strength - The patient's strength was normal in all extremities and pronator drift was absent mild right grip weakness..  Bulk was normal and fasciculations were absent.   Motor Tone - Muscle tone was assessed at the neck and appendages and was normal.  Diminished fine finger movements on the right compared to the left. Reflexes -  The patient's reflexes were symmetrical in all extremities and he had no pathological reflexes.  Sensory - Light touch, temperature/pinprick were assessed and were symmetrical.    Coordination - The patient had normal movements in the hands with no ataxia or dysmetria.  Tremor was absent.  Gait and Station - deferred.   ASSESSMENT/PLAN [name] is a 64 y.o. 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 He was taking aspirin and brilinta as of 4/28 and scheduled for an endovascular procedure to treat his RIGHT sided aneurysm 5/5, however, on 5/4 he presented with intermittent dysphagia, R hand numbness and L occipital HA. CT showed L frontoparietal SAH.  He was taken to IR for aneurysm securement -> s/p balloon assisted coiling.  The patient is being kept in the ICU to monitor for post hemorrhage vasospasms.  [name] on amlodipine.  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   Cerebral angio 4/16 - blister like aneurysm right ICA paraophthalmic segment 5.70mm. and left MCA bifurcation saccular aneurysm wide neck 4.38mm  Cerebral angio 5/4 - 4.40mm wide neck L MCA bifurcation aneurysm s/p balloon assisted coiling.   MRI Scattered SAH L sylvian fissure and L frontoparietal convexity stable. Probable few punctate L frontal and parietal lobe infarcts, other DWI abnormality d/t SAH. L MCA coil.  2D Echo EF 60-65%. No source of embolus   TCD MWF 5/5 no vasospasm; 5/7 normal; 5/10 normal  EEG no sz  LDL 149  HgbA1c 5.2   On nimodipine 60 mg q4h x 21 days  SCDs for VTE prophylaxis  aspirin 81 mg daily and Brilinta 90 daily prior to admission, on aspirin 81 mg daily. Stopped Brilinta.   Therapy recommendations:  No PT - OP OT, OP SLP  Disposition:  return home   Transfer to the Progressive  unit  Follow up Dr. Everlena Cooper  Stroke team will sign off - call if needed  Blood Pressure  Home meds:  None, no hx HTN  Off Cleviprex  Labetalol PRN . SBP goal < 160  . Long-term BP goal normotensive  Hyperlipidemia  Home meds:  No statin  LDL 149  On lipitor 40  Continue on discharge  Alcohol use  ETOH use, alcohol intake elevated lately  Advised to drink no more than 2 drink(s) a day.   Placed on CIWA protocol  (prn ativan)  FA/B1/MVI  Seizure precaution  Tobacco abuse  Current smoker  Smoking cessation counseling provided  Nicotine patch provided   Pt is willing to quit  Other Stroke Risk Factors  Family hx aneurysm (father)   Other  Active Problems  Code status - Full code  Conductive hearing loss in left ear  Hospital day # 7 I counseled the patient about his headache and right-sided numbness which is likely fluctuating and should eventually settle down in the next week or 2.  If his headache becomes severe or he has more severe symptoms may need to repeat brain imaging.  Continued transcranial Doppler for surveillance 3 days a week for another week and stop.  Continue nimodipine Patient counseled to quit smoking.  Stroke team will sign off.  Kindly call back for questions.  Discussed with Dr. Sherlon Handing.  Greater than 50% time during this 25-minute visit was spent in counseling and coordination of care discussion with care team and answering questions. Delia Heady, MD To contact Stroke Continuity provider, please refer to [email or web address]. After hours, contact General Neurology

## 2019-08-13 NOTE — Progress Notes (Signed)
PROGRESS NOTE    Victor Pena  OVZ:[id number] DOB: 1956-03-09 DOA: 08/06/2019 PCP: Patient, No Pcp Per   Chief complaint: Dysphagia right hand numbness  Brief Narrative: 64 year old male with history of intracranial aneurysm presented with difficulty speaking right hand weakness and numbness CT showed subarachnoid bleed, underwent IR emergent balloon assisted coiling on 5/4 by Dr. Sherlon Handing.  Since then expressive aphasia has continued to improve. Medical team consulted for comanagement  Subjective: Mild headache, otherwise no new complaints reports able to speak clearly every day.  Mild stammering present.   Assessment & Plan:  Aneurysmal subarachnoid hemorrhage status post coiling on 5/4 being monitored with transcranial Dopplers daily, Day x8/14 for pass a spasm.  On aspirin 81 mg, Lipitor 40 mg, nimodipine 60 mg every 4 hour.  Continue plan of care as per IR.  HLD: LDL at 149 goal less than 70, continue 40 milligrams Lipitor.  Metabolic acidosis: with bicarb of 21 encourage oral hydration.  Tobacco abuse: On nicotine patch.  Alcohol use continue CIWA protocol no signs of withdrawal.  Continue thiamine folate and multivitamins.  DVT prophylaxis:SCD Code Status:Full Family Communication:Plan of care discussed with patient at bedside.  Status is: Inpatient Remains inpatient appropriate because:Inpatient level of care appropriate due to severity of illness  Dispo: The patient is from: Home              Anticipated d/c is to: Home              Anticipated d/c date is: > 3 days              Patient currently is not medically stable to d/c.  Discharge plan as per primary urology team Nutrition: Diet Order            Diet regular Room service appropriate? Yes; Fluid consistency: Thin  Diet effective now             Body mass index is 26.5 kg/m. Consultants: neuro, TRH Procedures: ANEURYSMAL COILING Microbiology:see note  Medications: Scheduled Meds: . aspirin  81 mg  Oral Daily  . atorvastatin  40 mg Oral Daily  . Chlorhexidine Gluconate Cloth  6 each Topical Daily  . folic acid  1 mg Oral Daily  . multivitamin with minerals  1 tablet Oral Daily  . nicotine  14 mg Transdermal Daily  . niMODipine  60 mg Oral Q4H  . pantoprazole  40 mg Oral Daily  . pneumococcal 23 valent vaccine  0.5 mL Intramuscular Tomorrow-1000  . senna-docusate  1 tablet Oral BID  . thiamine  100 mg Oral Daily   Or  . thiamine  100 mg Intravenous Daily   Continuous Infusions: . sodium chloride Stopped (08/08/19 1628)    Antimicrobials: Anti-infectives (From admission, onward)   None    Objective: Vitals: Today's Vitals   08/13/19 0700 08/13/19 0800 08/13/19 1150 08/13/19 1200  BP:  112/67 137/85   Pulse:      Resp: 19 18 19    Temp:  97.7 F (36.5 C)  98.1 F (36.7 C)  TempSrc:  Oral  Oral  SpO2: 92% 98%    Weight:      Height:      PainSc:  0-No pain  0-No pain   No intake or output data in the 24 hours ending 08/13/19 1434 Filed Weights   08/06/19 1213  Weight: 86.2 kg   Weight change:    Intake/Output from previous day: 05/10 0701 - 05/11 0700 In: 400 [P.O.:400] Out: -  Intake/Output this shift: No intake/output data recorded.  Examination:  General exam: AAOx3 ,NAD, weak appearing. HEENT:Oral mucosa moist, Ear/Nose WNL grossly,dentition normal. Respiratory system: bilaterally clear,no wheezing or crackles,no use of accessory muscle, non tender. Cardiovascular system: S1 & S2 +, regular, No JVD. Gastrointestinal system: Abdomen soft, NT,ND, BS+. Nervous System:Alert, awake, moving extremities and grossly nonfocal Extremities: No edema, distal peripheral pulses palpable.  Skin: No rashes,no icterus. MSK: Normal muscle bulk,tone, power  Data Reviewed: I have personally reviewed following labs and imaging studies CBC: Recent Labs  Lab 08/07/19 0631 08/08/19 0219 08/09/19 0916 08/10/19 0559 08/11/19 0628  WBC 9.9 7.7 7.4 7.2 7.5  HGB  12.2* 11.9* 13.2 13.0 13.6  HCT 36.0* 35.6* 40.0 38.4* 39.9  MCV 105.6* 106.6* 106.1* 104.6* 103.6*  PLT 239 246 269 296 973   Basic Metabolic Panel: Recent Labs  Lab 08/07/19 0631 08/08/19 0219 08/09/19 0916 08/10/19 0559 08/11/19 0628  NA 136 139 138 139 140  K 3.9 4.2 3.7 4.2 3.7  CL 104 107 106 105 108  CO2 17* 25 22 25  21*  GLUCOSE 114* 105* 104* 101* 99  BUN 15 15 15 20 15   CREATININE 0.80 0.86 0.77 0.96 0.73  CALCIUM 8.1* 8.6* 9.0 9.2 9.2  MG  --  1.8  --   --   --   PHOS  --  2.3*  --   --   --    GFR: Estimated Creatinine Clearance: 99.4 mL/min (by C-G formula based on SCr of 0.73 mg/dL). Liver Function Tests: No results for input(s): AST, ALT, ALKPHOS, BILITOT, PROT, ALBUMIN in the last 168 hours. No results for input(s): LIPASE, AMYLASE in the last 168 hours. No results for input(s): AMMONIA in the last 168 hours. Coagulation Profile: No results for input(s): INR, PROTIME in the last 168 hours. Cardiac Enzymes: No results for input(s): CKTOTAL, CKMB, CKMBINDEX, TROPONINI in the last 168 hours. BNP (last 3 results) No results for input(s): PROBNP in the last 8760 hours. HbA1C: No results for input(s): HGBA1C in the last 72 hours. CBG: No results for input(s): GLUCAP in the last 168 hours. Lipid Profile: No results for input(s): CHOL, HDL, LDLCALC, TRIG, CHOLHDL, LDLDIRECT in the last 72 hours. Thyroid Function Tests: No results for input(s): TSH, T4TOTAL, FREET4, T3FREE, THYROIDAB in the last 72 hours. Anemia Panel: No results for input(s): VITAMINB12, FOLATE, FERRITIN, TIBC, IRON, RETICCTPCT in the last 72 hours. Sepsis Labs: No results for input(s): PROCALCITON, LATICACIDVEN in the last 168 hours.  Recent Results (from the past 240 hour(s))  SARS CORONAVIRUS 2 (TAT 6-24 HRS) Nasopharyngeal Nasopharyngeal Swab     Status: None   Collection Time: 08/06/19  9:54 AM   Specimen: Nasopharyngeal Swab  Result Value Ref Range Status   SARS Coronavirus 2  NEGATIVE NEGATIVE Final    Comment: (NOTE) SARS-CoV-2 target nucleic acids are NOT DETECTED. The SARS-CoV-2 RNA is generally detectable in upper and lower respiratory specimens during the acute phase of infection. Negative results do not preclude SARS-CoV-2 infection, do not rule out co-infections with other pathogens, and should not be used as the sole basis for treatment or other patient management decisions. Negative results must be combined with clinical observations, patient history, and epidemiological information. The expected result is Negative. Fact Sheet for Patients: SugarRoll.be Fact Sheet for Healthcare Providers: https://www.woods-mathews.com/ This test is not yet approved or cleared by the [place] FDA and  has been authorized for detection and/or diagnosis of SARS-CoV-2 by FDA under an Emergency Use  Authorization (EUA). This EUA will remain  in effect (meaning this test can be used) for the duration of the COVID-19 declaration under Section 56 4(b)(1) of the Act, 21 U.S.C. section 360bbb-3(b)(1), unless the authorization is terminated or revoked sooner. Performed at [facility], [address] [place], [place] [address]   Respiratory Panel by RT PCR (Flu A&B, Covid) - Nasopharyngeal Swab     Status: None   Collection Time: 08/06/19  1:05 PM   Specimen: Nasopharyngeal Swab  Result Value Ref Range Status   SARS Coronavirus 2 by RT PCR NEGATIVE NEGATIVE Final    Comment: (NOTE) SARS-CoV-2 target nucleic acids are NOT DETECTED. The SARS-CoV-2 RNA is generally detectable in upper respiratoy specimens during the acute phase of infection. The lowest concentration of SARS-CoV-2 viral copies this assay can detect is 131 copies/mL. A negative result does not preclude SARS-Cov-2 infection and should not be used as the sole basis for treatment or other patient management decisions. A negative result may occur with    improper specimen collection/handling, submission of specimen other than nasopharyngeal swab, presence of viral mutation(s) within the areas targeted by this assay, and inadequate number of viral copies (<131 copies/mL). A negative result must be combined with clinical observations, patient history, and epidemiological information. The expected result is Negative. Fact Sheet for Patients:  https://www.moore.com/ Fact Sheet for Healthcare Providers:  https://www.young.biz/ This test is not yet ap proved or cleared by the [place] FDA and  has been authorized for detection and/or diagnosis of SARS-CoV-2 by FDA under an Emergency Use Authorization (EUA). This EUA will remain  in effect (meaning this test can be used) for the duration of the COVID-19 declaration under Section 564(b)(1) of the Act, 21 U.S.C. section 360bbb-3(b)(1), unless the authorization is terminated or revoked sooner.    Influenza A by PCR NEGATIVE NEGATIVE Final   Influenza B by PCR NEGATIVE NEGATIVE Final    Comment: (NOTE) The Xpert Xpress SARS-CoV-2/FLU/RSV assay is intended as an aid in  the diagnosis of influenza from Nasopharyngeal swab specimens and  should not be used as a sole basis for treatment. Nasal washings and  aspirates are unacceptable for Xpert Xpress SARS-CoV-2/FLU/RSV  testing. Fact Sheet for Patients: https://www.moore.com/ Fact Sheet for Healthcare Providers: https://www.young.biz/ This test is not yet approved or cleared by the [place] FDA and  has been authorized for detection and/or diagnosis of SARS-CoV-2 by  FDA under an Emergency Use Authorization (EUA). This EUA will remain  in effect (meaning this test can be used) for the duration of the  Covid-19 declaration under Section 564(b)(1) of the Act, 21  U.S.C. section 360bbb-3(b)(1), unless the authorization is  terminated or revoked. Performed at  [facility], [address], [place], [place] [address]   MRSA PCR Screening     Status: None   Collection Time: 08/06/19  2:37 PM   Specimen: Nasal Mucosa; Nasopharyngeal  Result Value Ref Range Status   MRSA by PCR NEGATIVE NEGATIVE Final    Comment:        The GeneXpert MRSA Assay (FDA approved for NASAL specimens only), is one component of a comprehensive MRSA colonization surveillance program. It is not intended to diagnose MRSA infection nor to guide or monitor treatment for MRSA infections. Performed at [facility], [address] [place], [place] [address]       Radiology Studies: VAS [place] TRANSCRANIAL DOPPLER  Result Date: 08/12/2019  Transcranial Doppler Indications: Subarachnoid hemorrhage. Performing  Technologist: Jeb Levering RDMS, RVT  Examination Guidelines: A complete evaluation includes B-mode imaging, spectral Doppler, color Doppler, and power Doppler as needed of all accessible portions of each vessel. Bilateral testing is considered an integral part of a complete examination. Limited examinations for reoccurring indications may be performed as noted.  +----------+-------------+----------+-----------+--------------+ RIGHT TCD Right VM (cm)Depth (cm)Pulsatility   Comment     +----------+-------------+----------+-----------+--------------+ MCA           69.00                 1.33                   +----------+-------------+----------+-----------+--------------+ ACA          -45.00                 1.56                   +----------+-------------+----------+-----------+--------------+ Term ICA      37.00                 1.63                   +----------+-------------+----------+-----------+--------------+ PCA           28.00                 0.82                   +----------+-------------+----------+-----------+--------------+ Opthalmic     23.00                  0.7                    +----------+-------------+----------+-----------+--------------+ ICA siphon                                  not visualized +----------+-------------+----------+-----------+--------------+ Vertebral    -15.00                  0.9                   +----------+-------------+----------+-----------+--------------+  +----------+------------+----------+-----------+-------+ LEFT TCD  Left VM (cm)Depth (cm)PulsatilityComment +----------+------------+----------+-----------+-------+ MCA          44.00                 1.38            +----------+------------+----------+-----------+-------+ ACA          -47.00                1.19            +----------+------------+----------+-----------+-------+ Term ICA     40.00                 1.09            +----------+------------+----------+-----------+-------+ PCA          34.00                 1.27            +----------+------------+----------+-----------+-------+ Opthalmic    26.00                 1.22            +----------+------------+----------+-----------+-------+ ICA siphon   37.00                 1.13            +----------+------------+----------+-----------+-------+  Vertebral    -23.00                1.19            +----------+------------+----------+-----------+-------+  +------------+-------+-------+             VM cm/sComment +------------+-------+-------+ Prox Basilar-26.00         +------------+-------+-------+ Dist Basilar-21.00         +------------+-------+-------+ Summary: This was a normal transcranial Doppler study, with normal flow direction and velocity of all identified vessels of the anterior and posterior circulations, with no evidence of stenosis, vasospasm or occlusion. There was no evidence of intracranial disease.  *See table(s) above for TCD measurements and observations.  Diagnosing physician: Delia HeadyPramod Sethi MD Electronically signed by Delia HeadyPramod Sethi MD on 08/12/2019 at 2:04:24 PM.     Final      LOS: 7 days   Lanae Boastamesh Mackenna Kamer, MD [facility]  08/13/2019, 2:34 PM

## 2019-08-13 NOTE — Progress Notes (Signed)
  Speech Language Pathology Treatment: Cognitive-Linquistic  Patient Details Name: Victor Pena MRN: [id number] DOB: Feb 20, 1956 Today's Date: 08/13/2019 Time: 1751-0258 SLP Time Calculation (min) (ACUTE ONLY): 22 min  Assessment / Plan / Recommendation Clinical Impression  Significant improvements in speech production.  Output continues to be slower than usual, a product of pt's deliberation in order to avoid misproductions of consonants. Prosody and flow is sacrificed, but as precision of production improves, so will the natural parameters of speech. Pt states that he is ~95% of baseline.  He demonstrated independence with multisyllabic words.  Oral reading of paragraph from newspaper was 100% intelligible and there were <5 hesitations, with good self-monitoring.  Written expression was also accurate at 3-4 sentence level. Will f/u x1 to assess necessity of f/u at OP (pt may benefit from further assessment of conversational tasks in more challenging communication situations). Pt agrees.   HPI HPI: 64 y.o. male with a history of intracranial aneurysms and diplopia.  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  He underwent a diagnostic cerebral angiogram on 07/19/2019 that showed a 4.4 x 4.2 mm wide neck left MCA bifurcation aneurysm and a blisterlike right ICA bifurcation aneurysm measuring 5.6 x 3 0.1 mm with superimposed pseudolobation. Pt presented to ED with intermittent dysphasia, HA, and difficulty utilizing R hand. CT revealed SAH. Pt now s/p coiling.      SLP Plan  Continue with current plan of care       Recommendations                   Follow up Recommendations: Outpatient SLP SLP Visit Diagnosis: Apraxia (R48.2) Plan: Continue with current plan of care       GO               Victor Pena L. Victor Frederic, MA CCC/SLP Acute Rehabilitation Services Office number [phone] Pager [phone]  Blenda Mounts Victor Pena 08/13/2019, 3:22  PM

## 2019-08-14 ENCOUNTER — Inpatient Hospital Stay (HOSPITAL_COMMUNITY): Payer: Managed Care, Other (non HMO)

## 2019-08-14 DIAGNOSIS — E785 Hyperlipidemia, unspecified: Secondary | ICD-10-CM

## 2019-08-14 DIAGNOSIS — I609 Nontraumatic subarachnoid hemorrhage, unspecified: Secondary | ICD-10-CM

## 2019-08-14 NOTE — Progress Notes (Addendum)
Referring Physician(s): Jaffe,Adam R  Supervising Physician: Baldemar Lenis  Patient Status:  [facility] - In-pt  Chief Complaint: "Right arm numbness"  Subjective:  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 Tommie Sams. Patient awake and alert sitting in bed. Complains of right arm numbness (specifically posterior forearm/elbow), states constant, no change from yesterday. Hesitant with speech- continues to improve.   Allergies: Other  Medications: Prior to Admission medications   Medication Sig Start Date End Date Taking? Authorizing Provider  acetaminophen (TYLENOL) 500 MG tablet Take 1,000 mg by mouth every 6 (six) hours as needed for mild pain or headache.   Yes [provider]  aspirin 81 MG EC tablet Take 81 mg by mouth daily. Swallow whole.   Yes [provider]  ibuprofen (ADVIL) 200 MG tablet Take 400 mg by mouth every 6 (six) hours as needed for moderate pain.   Yes [provider]  ticagrelor (BRILINTA) 90 MG TABS tablet Take 90 mg by mouth daily.    Yes [provider]  diazepam (VALIUM) 5 MG tablet Take 1 tablet 30 mins prior to MRI. May take second dose if needed. Patient not taking: Reported on 07/17/2019 07/02/19   Van Clines, MD     Vital Signs: BP 122/79 (BP Location: Right Arm)   Pulse 65   Temp 98.1 F (36.7 C) (Oral)   Resp (!) 26   Ht 5\' 11"  (1.803 m)   Wt 190 lb (86.2 kg)   SpO2 100%   BMI 26.50 kg/m   Physical Exam Vitals and nursing note reviewed.  Constitutional:      General: [name] not in acute distress.    Appearance: Normal appearance.  Pulmonary:     Effort: Pulmonary effort is normal. No respiratory distress.  Skin:    General: Skin is warm and dry.  Neurological:     Mental Status: [name] alert.     Comments: Alert, awake, and oriented x3. Follows  simple commands. Hesitant with speech but expressive aphasia continues to improve. PERRL bilaterally. EOMs intact bilaterally without nystagmus or subjective diplopia. Mild right facial droop. Tongue midline. Hand grip 4+/5 on right and 5/5 on left, lower extremity strength 5/5 bilaterally. Facial and lower extremity sensation intact and equal bilaterally; patient reports decreased sensation of right forearm (posterior forearm/elbow) compared to left forearm.  No pronator drift.     Imaging: VAS TRANSCRANIAL DOPPLER  Result Date: 08/12/2019  Transcranial Doppler Indications: Subarachnoid hemorrhage. Performing Technologist: 10/12/2019 RDMS, RVT  Examination Guidelines: A complete evaluation includes B-mode imaging, spectral Doppler, color Doppler, and power Doppler as needed of all accessible portions of each vessel. Bilateral testing is considered an integral part of a complete examination. Limited examinations for reoccurring indications may be performed as noted.  +----------+-------------+----------+-----------+--------------+ RIGHT TCD Right VM (cm)Depth (cm)Pulsatility   Comment     +----------+-------------+----------+-----------+--------------+ MCA           69.00                 1.33                   +----------+-------------+----------+-----------+--------------+ ACA          -45.00                 1.56                   +----------+-------------+----------+-----------+--------------+  Term ICA      37.00                 1.63                   +----------+-------------+----------+-----------+--------------+ PCA           28.00                 0.82                   +----------+-------------+----------+-----------+--------------+ Opthalmic     23.00                  0.7                   +----------+-------------+----------+-----------+--------------+ ICA siphon                                  not visualized  +----------+-------------+----------+-----------+--------------+ Vertebral    -15.00                  0.9                   +----------+-------------+----------+-----------+--------------+  +----------+------------+----------+-----------+-------+ LEFT TCD  Left VM (cm)Depth (cm)PulsatilityComment +----------+------------+----------+-----------+-------+ MCA          44.00                 1.38            +----------+------------+----------+-----------+-------+ ACA          -47.00                1.19            +----------+------------+----------+-----------+-------+ Term ICA     40.00                 1.09            +----------+------------+----------+-----------+-------+ PCA          34.00                 1.27            +----------+------------+----------+-----------+-------+ Opthalmic    26.00                 1.22            +----------+------------+----------+-----------+-------+ ICA siphon   37.00                 1.13            +----------+------------+----------+-----------+-------+ Vertebral    -23.00                1.19            +----------+------------+----------+-----------+-------+  +------------+-------+-------+             VM cm/sComment +------------+-------+-------+ Prox Basilar-26.00         +------------+-------+-------+ Dist Basilar-21.00         +------------+-------+-------+ Summary: This was a normal transcranial Doppler study, with normal flow direction and velocity of all identified vessels of the anterior and posterior circulations, with no evidence of stenosis, vasospasm or occlusion. There was no evidence of intracranial disease.  *See table(s) above for TCD measurements and observations.  Diagnosing physician: Delia Heady MD Electronically signed by Delia Heady MD on 08/12/2019 at 2:04:24 PM.    Final     Labs:  CBC: Recent Labs  08/08/19 0219 08/09/19 0916 08/10/19 0559 08/11/19 0628  WBC 7.7 7.4 7.2 7.5    HGB 11.9* 13.2 13.0 13.6  HCT 35.6* 40.0 38.4* 39.9  PLT 246 269 296 338    COAGS: Recent Labs    07/15/19 1344 08/06/19 1217 08/06/19 1244  INR 1.0 1.0 1.0  APTT 29 27 28     BMP: Recent Labs    08/08/19 0219 08/09/19 0916 08/10/19 0559 08/11/19 0628  NA 139 138 139 140  K 4.2 3.7 4.2 3.7  CL 107 106 105 108  CO2 25 22 25  21*  GLUCOSE 105* 104* 101* 99  BUN 15 15 20 15   CALCIUM 8.6* 9.0 9.2 9.2  CREATININE 0.86 0.77 0.96 0.73  GFRNONAA >60 >60 >60 >60  GFRAA >60 >60 >60 >60    LIVER FUNCTION TESTS: Recent Labs    08/06/19 1217  BILITOT 0.9  AST 49*  ALT 46*  ALKPHOS 60  PROT 7.4  ALBUMIN 4.0    Assessment and Plan:  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 Karenann Cai. Discussed RUE numbness, stable at this time- again instructed patient to call RN if this worsens/changes (could be a sign of vasospasm). For TCD today to evaluate for vasospasm. Continue Nimodipine for vasospasm prophylaxis.  Continue taking Aspirin 81 mg once daily. For transfer to progressive unit pending bed availability. Alcohol/tobacco abuse- on CIWA protocol, Nicotine patch in use. Appreciate PT/OT/speech evaluations- continue with SLP/OT outpatient per recommendations. Further plans per [facility]- appreciate assistance with patient. NIR to follow.   Electronically Signed: Earley Abide, PA-C 08/14/2019, 8:59 AM   I spent a total of 35 Minutes at the the patient's bedside AND on the patient's hospital floor or unit, greater than 50% of which was counseling/coordinating care for ruptured left MCA bifurcation aneurysm s/p coiling.

## 2019-08-14 NOTE — Progress Notes (Signed)
Transcranial Doppler  Date POD PCO2 HCT BP  MCA ACA PCA OPHT SIPH VERT Basilar  5/5     Right  Left                                       5/7     Right  Left                                       5/10     Right  Left   69  44   -45  -47   28  34   23  26   *  37   -15  -23   -26      5/12 GC      Right  Left   66  71   -22  -26   -34  -22   17  21   18  25    -19  -24   -37            Right  Left                                            Right  Left                                            Right  Left                                        MCA = Middle Cerebral Artery      OPHT = Opthalmic Artery     BASILAR = Basilar Artery   ACA = Anterior Cerebral Artery     SIPH = Carotid Siphon PCA = Posterior Cerebral Artery   VERT = Verterbral Artery                   Normal MCA = 62+\-12 ACA = 50+\-12 PCA = 42+\-23   08/14/19 12:37 PM 10/14/19 RVT

## 2019-08-14 NOTE — Progress Notes (Signed)
PROGRESS NOTE    Victor Pena  WSF:[id number] DOB: 02/08/1956 DOA: 08/06/2019 PCP: Patient, No Pcp Per   Brief Narrative:  64 year old male with history of intracranial aneurysm presented with difficulty speaking right hand weakness and numbness CT showed subarachnoid bleed, underwent IR emergent balloon assisted coiling on 5/4 by Dr. Sherlon Handing.  Since then expressive aphasia has continued to improve. Medical team consulted for comanagement   Assessment & Plan:   Active Problems:   Subarachnoid hemorrhage (HCC)  Aneurysmal subarachnoid hemorrhage status post coiling on 5/4  Being monitored by IR/neurology for spasm.  Day 8 of 14 Continue aspirin, Lipitor, amlodipine Routine care per IR  Hyperlipidemia LDL 149, on Lipitor 40 mg  Metabolic acidosis Likely from dehydration.  Alcohol abuse Monitor with alcohol withdrawal protocol.  Continue thiamine, folic acid, multivitamin  Tobacco abuse Nicotine patch  Macrocytosis Check TSH, B12, folate  DVT prophylaxis: SCDs Code Status: Full code Discharge disposition per primary team   Subjective: Saw the patient briefly he appeared comfortable he was getting transcranial Dopplers.  Review of Systems Otherwise negative except as per HPI, including: General: Denies fever, chills, night sweats or unintended weight loss. Resp: Denies cough, wheezing, shortness of breath. Cardiac: Denies chest pain, palpitations, orthopnea, paroxysmal nocturnal dyspnea. GI: Denies abdominal pain, nausea, vomiting, diarrhea or constipation GU: Denies dysuria, frequency, hesitancy or incontinence MS: Denies muscle aches, joint pain or swelling Neuro: Denies headache, neurologic deficits (focal weakness, numbness, tingling), abnormal gait Psych: Denies anxiety, depression, SI/HI/AVH Skin: Denies new rashes or lesions ID: Denies sick contacts, exotic exposures, travel  Examination:  Did not perform full examination as patient was getting  transcranial Dopplers during my evaluation.   Objective: Vitals:   08/14/19 0000 08/14/19 0100 08/14/19 0400 08/14/19 0500  BP: 114/75  125/77   Pulse: 69  65   Resp: 20 (!) 33 16 (!) 5  Temp: 98.1 F (36.7 C)  98.5 F (36.9 C)   TempSrc: Oral  Oral   SpO2: 100%  100%   Weight:      Height:       No intake or output data in the 24 hours ending 08/14/19 0734 Filed Weights   08/06/19 1213  Weight: 86.2 kg     Data Reviewed:   CBC: Recent Labs  Lab 08/08/19 0219 08/09/19 0916 08/10/19 0559 08/11/19 0628  WBC 7.7 7.4 7.2 7.5  HGB 11.9* 13.2 13.0 13.6  HCT 35.6* 40.0 38.4* 39.9  MCV 106.6* 106.1* 104.6* 103.6*  PLT 246 269 296 338   Basic Metabolic Panel: Recent Labs  Lab 08/08/19 0219 08/09/19 0916 08/10/19 0559 08/11/19 0628  NA 139 138 139 140  K 4.2 3.7 4.2 3.7  CL 107 106 105 108  CO2 25 22 25  21*  GLUCOSE 105* 104* 101* 99  BUN 15 15 20 15   CREATININE 0.86 0.77 0.96 0.73  CALCIUM 8.6* 9.0 9.2 9.2  MG 1.8  --   --   --   PHOS 2.3*  --   --   --    GFR: Estimated Creatinine Clearance: 99.4 mL/min (by C-G formula based on SCr of 0.73 mg/dL). Liver Function Tests: No results for input(s): AST, ALT, ALKPHOS, BILITOT, PROT, ALBUMIN in the last 168 hours. No results for input(s): LIPASE, AMYLASE in the last 168 hours. No results for input(s): AMMONIA in the last 168 hours. Coagulation Profile: No results for input(s): INR, PROTIME in the last 168 hours. Cardiac Enzymes: No results for input(s): CKTOTAL, CKMB, CKMBINDEX, TROPONINI  in the last 168 hours. BNP (last 3 results) No results for input(s): PROBNP in the last 8760 hours. HbA1C: No results for input(s): HGBA1C in the last 72 hours. CBG: No results for input(s): GLUCAP in the last 168 hours. Lipid Profile: No results for input(s): CHOL, HDL, LDLCALC, TRIG, CHOLHDL, LDLDIRECT in the last 72 hours. Thyroid Function Tests: No results for input(s): TSH, T4TOTAL, FREET4, T3FREE, THYROIDAB in the  last 72 hours. Anemia Panel: No results for input(s): VITAMINB12, FOLATE, FERRITIN, TIBC, IRON, RETICCTPCT in the last 72 hours. Sepsis Labs: No results for input(s): PROCALCITON, LATICACIDVEN in the last 168 hours.  Recent Results (from the past 240 hour(s))  SARS CORONAVIRUS 2 (TAT 6-24 HRS) Nasopharyngeal Nasopharyngeal Swab     Status: None   Collection Time: 08/06/19  9:54 AM   Specimen: Nasopharyngeal Swab  Result Value Ref Range Status   SARS Coronavirus 2 NEGATIVE NEGATIVE Final    Comment: (NOTE) SARS-CoV-2 target nucleic acids are NOT DETECTED. The SARS-CoV-2 RNA is generally detectable in upper and lower respiratory specimens during the acute phase of infection. Negative results do not preclude SARS-CoV-2 infection, do not rule out co-infections with other pathogens, and should not be used as the sole basis for treatment or other patient management decisions. Negative results must be combined with clinical observations, patient history, and epidemiological information. The expected result is Negative. Fact Sheet for Patients: HairSlick.no Fact Sheet for Healthcare Providers: quierodirigir.com This test is not yet approved or cleared by the [place] FDA and  has been authorized for detection and/or diagnosis of SARS-CoV-2 by FDA under an Emergency Use Authorization (EUA). This EUA will remain  in effect (meaning this test can be used) for the duration of the COVID-19 declaration under Section 56 4(b)(1) of the Act, 21 U.S.C. section 360bbb-3(b)(1), unless the authorization is terminated or revoked sooner. Performed at [facility], [address] [place], [place] [address]   Respiratory Panel by RT PCR (Flu A&B, Covid) - Nasopharyngeal Swab     Status: None   Collection Time: 08/06/19  1:05 PM   Specimen: Nasopharyngeal Swab  Result Value Ref Range Status   SARS Coronavirus 2 by RT PCR NEGATIVE NEGATIVE  Final    Comment: (NOTE) SARS-CoV-2 target nucleic acids are NOT DETECTED. The SARS-CoV-2 RNA is generally detectable in upper respiratoy specimens during the acute phase of infection. The lowest concentration of SARS-CoV-2 viral copies this assay can detect is 131 copies/mL. A negative result does not preclude SARS-Cov-2 infection and should not be used as the sole basis for treatment or other patient management decisions. A negative result may occur with  improper specimen collection/handling, submission of specimen other than nasopharyngeal swab, presence of viral mutation(s) within the areas targeted by this assay, and inadequate number of viral copies (<131 copies/mL). A negative result must be combined with clinical observations, patient history, and epidemiological information. The expected result is Negative. Fact Sheet for Patients:  https://www.moore.com/ Fact Sheet for Healthcare Providers:  https://www.young.biz/ This test is not yet ap proved or cleared by the [place] FDA and  has been authorized for detection and/or diagnosis of SARS-CoV-2 by FDA under an Emergency Use Authorization (EUA). This EUA will remain  in effect (meaning this test can be used) for the duration of the COVID-19 declaration under Section 564(b)(1) of the Act, 21 U.S.C. section 360bbb-3(b)(1), unless the authorization is terminated or revoked sooner.    Influenza A by PCR NEGATIVE NEGATIVE Final   Influenza B  by PCR NEGATIVE NEGATIVE Final    Comment: (NOTE) The Xpert Xpress SARS-CoV-2/FLU/RSV assay is intended as an aid in  the diagnosis of influenza from Nasopharyngeal swab specimens and  should not be used as a sole basis for treatment. Nasal washings and  aspirates are unacceptable for Xpert Xpress SARS-CoV-2/FLU/RSV  testing. Fact Sheet for Patients: https://www.moore.com/ Fact Sheet for Healthcare  Providers: https://www.young.biz/ This test is not yet approved or cleared by the [place] FDA and  has been authorized for detection and/or diagnosis of SARS-CoV-2 by  FDA under an Emergency Use Authorization (EUA). This EUA will remain  in effect (meaning this test can be used) for the duration of the  Covid-19 declaration under Section 564(b)(1) of the Act, 21  U.S.C. section 360bbb-3(b)(1), unless the authorization is  terminated or revoked. Performed at [facility], [address], [place], [place] [address]   MRSA PCR Screening     Status: None   Collection Time: 08/06/19  2:37 PM   Specimen: Nasal Mucosa; Nasopharyngeal  Result Value Ref Range Status   MRSA by PCR NEGATIVE NEGATIVE Final    Comment:        The GeneXpert MRSA Assay (FDA approved for NASAL specimens only), is one component of a comprehensive MRSA colonization surveillance program. It is not intended to diagnose MRSA infection nor to guide or monitor treatment for MRSA infections. Performed at [facility], [address] [place], [place] [address]          Radiology Studies: VAS [place] TRANSCRANIAL DOPPLER  Result Date: 08/12/2019  Transcranial Doppler Indications: Subarachnoid hemorrhage. Performing Technologist: Jeb Levering RDMS, RVT  Examination Guidelines: A complete evaluation includes B-mode imaging, spectral Doppler, color Doppler, and power Doppler as needed of all accessible portions of each vessel. Bilateral testing is considered an integral part of a complete examination. Limited examinations for reoccurring indications may be performed as noted.  +----------+-------------+----------+-----------+--------------+ RIGHT TCD Right VM (cm)Depth (cm)Pulsatility   Comment     +----------+-------------+----------+-----------+--------------+ MCA           69.00                 1.33                    +----------+-------------+----------+-----------+--------------+ ACA          -45.00                 1.56                   +----------+-------------+----------+-----------+--------------+ Term ICA      37.00                 1.63                   +----------+-------------+----------+-----------+--------------+ PCA           28.00                 0.82                   +----------+-------------+----------+-----------+--------------+ Opthalmic     23.00                  0.7                   +----------+-------------+----------+-----------+--------------+ ICA siphon  not visualized +----------+-------------+----------+-----------+--------------+ Vertebral    -15.00                  0.9                   +----------+-------------+----------+-----------+--------------+  +----------+------------+----------+-----------+-------+ LEFT TCD  Left VM (cm)Depth (cm)PulsatilityComment +----------+------------+----------+-----------+-------+ MCA          44.00                 1.38            +----------+------------+----------+-----------+-------+ ACA          -47.00                1.19            +----------+------------+----------+-----------+-------+ Term ICA     40.00                 1.09            +----------+------------+----------+-----------+-------+ PCA          34.00                 1.27            +----------+------------+----------+-----------+-------+ Opthalmic    26.00                 1.22            +----------+------------+----------+-----------+-------+ ICA siphon   37.00                 1.13            +----------+------------+----------+-----------+-------+ Vertebral    -23.00                1.19            +----------+------------+----------+-----------+-------+  +------------+-------+-------+             VM cm/sComment +------------+-------+-------+ Prox Basilar-26.00          +------------+-------+-------+ Dist Basilar-21.00         +------------+-------+-------+ Summary: This was a normal transcranial Doppler study, with normal flow direction and velocity of all identified vessels of the anterior and posterior circulations, with no evidence of stenosis, vasospasm or occlusion. There was no evidence of intracranial disease.  *See table(s) above for TCD measurements and observations.  Diagnosing physician: Antony Contras MD Electronically signed by Antony Contras MD on 08/12/2019 at 2:04:24 PM.    Final         Scheduled Meds: . aspirin  81 mg Oral Daily  . atorvastatin  40 mg Oral Daily  . Chlorhexidine Gluconate Cloth  6 each Topical Daily  . folic acid  1 mg Oral Daily  . multivitamin with minerals  1 tablet Oral Daily  . nicotine  14 mg Transdermal Daily  . niMODipine  60 mg Oral Q4H  . pantoprazole  40 mg Oral Daily  . pneumococcal 23 valent vaccine  0.5 mL Intramuscular Tomorrow-1000  . senna-docusate  1 tablet Oral BID  . thiamine  100 mg Oral Daily   Or  . thiamine  100 mg Intravenous Daily   Continuous Infusions: . sodium chloride Stopped (08/08/19 1628)     LOS: 8 days   Time spent= 25 mins    Teryl Gubler Arsenio Loader, MD [facility]  If 7PM-7AM, please contact night-coverage  08/14/2019, 7:34 AM

## 2019-08-15 LAB — VITAMIN B12: Vitamin B-12: 132 pg/mL — ABNORMAL LOW (ref 180–914)

## 2019-08-15 LAB — TSH: TSH: 3.652 u[IU]/mL (ref 0.350–4.500)

## 2019-08-15 MED ORDER — VITAMIN B-12 1000 MCG PO TABS
1000.0000 ug | ORAL_TABLET | Freq: Every day | ORAL | Status: DC
  Administered 2019-08-15 – 2019-08-20 (×6): 1000 ug via ORAL
  Filled 2019-08-15 (×6): qty 1

## 2019-08-15 NOTE — Progress Notes (Signed)
Occupational Therapy Treatment Patient Details Name: CLOUD GRAHAM MRN: [id number] DOB: 30-Jun-1955 Today's Date: 08/15/2019    History of present illness 64 y.o. male with a history of intracranial aneurysms and diplopia.  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  He underwent a diagnostic cerebral angiogram on 07/19/2019 that showed a 4.4 x 4.2 mm wide neck left MCA bifurcation aneurysm and a blisterlike right ICA bifurcation aneurysm measuring 5.6 x 3 0.1 mm with superimposed pseudolobation. Pt presented to ED with intermittent dysphasia, HA, and difficulty utilizing R hand. CT revealed SAH. Pt now s/p coiling.   OT comments  Patient continues to make steady progress towards goals in skilled OT session. Patient's session encompassed RUE HEP in order to focus in increased proprioception and fine motor dexterity. Pt with bead and small cups in room therefore session focused on translation and pincer grasp with RUE. Pt with 90% accuracy in sorting beads and provided education with regard to BE FAST acronym and suggestion to have wife or family complete driving until doctor clears him for increased safety. Discharge remains appropriate; will continue to follow acutely.    Follow Up Recommendations  Outpatient OT    Equipment Recommendations  None recommended by OT    Recommendations for Other Services      Precautions / Restrictions Precautions Precautions: None Restrictions Weight Bearing Restrictions: No       Mobility Bed Mobility                  Transfers                      Balance                                           ADL either performed or assessed with clinical judgement   ADL                                         General ADL Comments: Focused session on R FM exercises. '     Vision       Perception     Praxis      Cognition Arousal/Alertness: Awake/alert Behavior  During Therapy: WFL for tasks assessed/performed Overall Cognitive Status: Within Functional Limits for tasks assessed                                 General Comments: Minimal time needed to find words occasionally, but progressing well        Exercises Other Exercises Other Exercises: FM exercises. Pt performing ROM exercises including composite flex/ext, opposition, thumb circles, etc.  Other Exercises: Pt picking up and sorting beads with R pincer grasp Other Exercises: Continued to challenge in hand manipulation and translation with 3-4 beads from palm to finger tips to sort into appropriate cup (noted 90% accuracy with sorting)   Shoulder Instructions       General Comments      Pertinent Vitals/ Pain       Pain Assessment: No/denies pain  Home Living  Prior Functioning/Environment              Frequency  Min 2X/week        Progress Toward Goals  OT Goals(current goals can now be found in the care plan section)  Progress towards OT goals: Progressing toward goals  Acute Rehab OT Goals Patient Stated Goal: get back home and to work OT Goal Formulation: With patient Time For Goal Achievement: 08/21/19 Potential to Achieve Goals: Good  Plan Discharge plan remains appropriate    Co-evaluation                 AM-PAC OT "6 Clicks" Daily Activity     Outcome Measure   Help from another person eating meals?: None Help from another person taking care of personal grooming?: None Help from another person toileting, which includes using toliet, bedpan, or urinal?: A Little Help from another person bathing (including washing, rinsing, drying)?: A Little Help from another person to put on and taking off regular upper body clothing?: A Little Help from another person to put on and taking off regular lower body clothing?: A Little 6 Click Score: 20    End of Session    OT Visit  Diagnosis: Unsteadiness on feet (R26.81);Muscle weakness (generalized) (M62.81)   Activity Tolerance Patient tolerated treatment well   Patient Left in bed;with bed alarm set;with call bell/phone within reach   Nurse Communication Mobility status        Time: 6812-7517 OT Time Calculation (min): 19 min  Charges: OT General Charges $OT Visit: 1 Visit OT Treatments $Therapeutic Exercise: 8-22 mins  Corinne Ports E. Jonice Cerra, COTA/L Acute Rehabilitation Services [phone] [place] 08/15/2019, 12:38 PM

## 2019-08-15 NOTE — Progress Notes (Signed)
PROGRESS NOTE    Victor Pena  UXN:[id number] DOB: [birth year] DOA: 08/06/2019 PCP: Patient, No Pcp Per    Brief Narrative:  Victor Pena is a 64 y.o. male with medical history significant of intracranial aneurysm who initially presented with complaints of difficulty speaking with right hand weakness and numbness.  CT scan of the brain revealed a subarachnoid bleed or patient was noted to have a left MCA aneurysm.  IR was consulted and performed cerebral arteriogram with emergent balloon assisted coiling on 5/4 by Dr. Sherlon Handing.  Since that time patient expressive aphasia has continued to improve daily.  Patient is to remain in hospital for total of 14 days to monitor for vasospasm with daily transcranial Dopplers.   Assessment & Plan:   Active Problems:   Subarachnoid hemorrhage (HCC)  Aneurysmal subarachnoid hemorrhagestatus post coiling on 5/4  Patient presenting with dysphagia was found to have a subarachnoid hemorrhage with a ruptured left MCA bifurcation patient aneurysm, patient underwent cooling by interventional radiology on 08/06/2019. --Continue close monitoring by IR/neurology for spasm; Day #9 of 14 --Continue aspirin, Lipitor, amlodipine --Nimodipine 60mg  PO q4h  Hyperlipidemia LDL 149, on Lipitor 40 mg  Metabolic acidosis Likely from dehydration.  Alcohol abuse No signs of withdrawal symptoms.  Counseled on need for cessation. --continue thiamine, folic acid, multivitamin  Tobacco abuse Counseled on need for cessation. --Nicotine patch  Macrocytosis B12 132, low --Will start B12 PO daily  DVT prophylaxis: SCDs Code Status: Full code Family Communication: No family present at bedside  Disposition Plan:  Status is: Inpatient  Remains inpatient appropriate because:Ongoing diagnostic testing needed not appropriate for outpatient work up and Inpatient level of care appropriate due to severity of illness patient needs 14 days of close monitoring  following subarachnoid hemorrhage status post coiling for frequent transcranial Dopplers monitoring for vasospasm   Dispo: The patient is from: Home              Anticipated d/c is to: Home              Anticipated d/c date is: > 3 days              Patient currently is not medically stable to d/c.    Consultants:   Interventional radiology  Neurology  Procedures:   Left frontoparietal SAH secondary to left MCA aneurysm rupture status post emergent coiling 5/4  Antimicrobials:   None   Subjective: Patient seen and examined bedside, resting comfortably.  Speech continues to improve daily.  No problems with oral intake.  Continues with some mild paresthesias right posterior forearm which is also improving gradually daily.  No other specific complaints this morning.  Denies headache, no visual changes, no chest pain, no palpitations, no shortness of breath, no nausea/vomiting/diarrhea, no fever/chills/night sweats, no weakness.  No acute events overnight per nursing staff.  Objective: Vitals:   08/14/19 2011 08/14/19 2353 08/15/19 0401 08/15/19 0749  BP: 131/80 127/79 121/78 109/74  Pulse: 64 69 66 63  Resp: 18 18 18 18   Temp: 98.8 F (37.1 C) 99.1 F (37.3 C) 98.6 F (37 C) 98.4 F (36.9 C)  TempSrc: Oral   Oral  SpO2: 100% 99% 97% 99%  Weight:      Height:        Intake/Output Summary (Last 24 hours) at 08/15/2019 1005 Last data filed at 08/15/2019 08/17/2019 Gross per 24 hour  Intake 1000 ml  Output --  Net 1000 ml   08/17/2019  08/06/19 1213  Weight: 86.2 kg    Examination:  General exam: Appears calm and comfortable  Respiratory system: Clear to auscultation. Respiratory effort normal. Cardiovascular system: S1 & S2 heard, RRR. No JVD, murmurs, rubs, gallops or clicks. No pedal edema. Gastrointestinal system: Abdomen is nondistended, soft and nontender. No organomegaly or masses felt. Normal bowel sounds heard. Central nervous system: Alert and oriented. No  focal neurological deficits. Extremities: Symmetric 5 x 5 power. Skin: No rashes, lesions or ulcers Psychiatry: Judgement and insight appear normal. Mood & affect appropriate.     Data Reviewed: I have personally reviewed following labs and imaging studies  CBC: Recent Labs  Lab 08/09/19 0916 08/10/19 0559 08/11/19 0628  WBC 7.4 7.2 7.5  HGB 13.2 13.0 13.6  HCT 40.0 38.4* 39.9  MCV 106.1* 104.6* 103.6*  PLT 269 296 256   Basic Metabolic Panel: Recent Labs  Lab 08/09/19 0916 08/10/19 0559 08/11/19 0628  NA 138 139 140  K 3.7 4.2 3.7  CL 106 105 108  CO2 22 25 21*  GLUCOSE 104* 101* 99  BUN 15 20 15   CREATININE 0.77 0.96 0.73  CALCIUM 9.0 9.2 9.2   GFR: Estimated Creatinine Clearance: 99.4 mL/min (by C-G formula based on SCr of 0.73 mg/dL). Liver Function Tests: No results for input(s): AST, ALT, ALKPHOS, BILITOT, PROT, ALBUMIN in the last 168 hours. No results for input(s): LIPASE, AMYLASE in the last 168 hours. No results for input(s): AMMONIA in the last 168 hours. Coagulation Profile: No results for input(s): INR, PROTIME in the last 168 hours. Cardiac Enzymes: No results for input(s): CKTOTAL, CKMB, CKMBINDEX, TROPONINI in the last 168 hours. BNP (last 3 results) No results for input(s): PROBNP in the last 8760 hours. HbA1C: No results for input(s): HGBA1C in the last 72 hours. CBG: No results for input(s): GLUCAP in the last 168 hours. Lipid Profile: No results for input(s): CHOL, HDL, LDLCALC, TRIG, CHOLHDL, LDLDIRECT in the last 72 hours. Thyroid Function Tests: Recent Labs    08/15/19 0417  TSH 3.652   Anemia Panel: Recent Labs    08/15/19 0417  VITAMINB12 132*   Sepsis Labs: No results for input(s): PROCALCITON, LATICACIDVEN in the last 168 hours.  Recent Results (from the past 240 hour(s))  SARS CORONAVIRUS 2 (TAT 6-24 HRS) Nasopharyngeal Nasopharyngeal Swab     Status: None   Collection Time: 08/06/19  9:54 AM   Specimen:  Nasopharyngeal Swab  Result Value Ref Range Status   SARS Coronavirus 2 NEGATIVE NEGATIVE Final    Comment: (NOTE) SARS-CoV-2 target nucleic acids are NOT DETECTED. The SARS-CoV-2 RNA is generally detectable in upper and lower respiratory specimens during the acute phase of infection. Negative results do not preclude SARS-CoV-2 infection, do not rule out co-infections with other pathogens, and should not be used as the sole basis for treatment or other patient management decisions. Negative results must be combined with clinical observations, patient history, and epidemiological information. The expected result is Negative. Fact Sheet for Patients: SugarRoll.be Fact Sheet for Healthcare Providers: https://www.woods-mathews.com/ This test is not yet approved or cleared by the [place] FDA and  has been authorized for detection and/or diagnosis of SARS-CoV-2 by FDA under an Emergency Use Authorization (EUA). This EUA will remain  in effect (meaning this test can be used) for the duration of the COVID-19 declaration under Section 56 4(b)(1) of the Act, 21 U.S.C. section 360bbb-3(b)(1), unless the authorization is terminated or revoked sooner. Performed at [facility], [place]  [address] [place], [place] [address]   Respiratory Panel by RT PCR (Flu A&B, Covid) - Nasopharyngeal Swab     Status: None   Collection Time: 08/06/19  1:05 PM   Specimen: Nasopharyngeal Swab  Result Value Ref Range Status   SARS Coronavirus 2 by RT PCR NEGATIVE NEGATIVE Final    Comment: (NOTE) SARS-CoV-2 target nucleic acids are NOT DETECTED. The SARS-CoV-2 RNA is generally detectable in upper respiratoy specimens during the acute phase of infection. The lowest concentration of SARS-CoV-2 viral copies this assay can detect is 131 copies/mL. A negative result does not preclude SARS-Cov-2 infection and should not be used as the sole basis for treatment  or other patient management decisions. A negative result may occur with  improper specimen collection/handling, submission of specimen other than nasopharyngeal swab, presence of viral mutation(s) within the areas targeted by this assay, and inadequate number of viral copies (<131 copies/mL). A negative result must be combined with clinical observations, patient history, and epidemiological information. The expected result is Negative. Fact Sheet for Patients:  https://www.moore.com/https://www.fda.gov/media/142436/download Fact Sheet for Healthcare Providers:  https://www.young.biz/https://www.fda.gov/media/142435/download This test is not yet ap proved or cleared by the [place] FDA and  has been authorized for detection and/or diagnosis of SARS-CoV-2 by FDA under an Emergency Use Authorization (EUA). This EUA will remain  in effect (meaning this test can be used) for the duration of the COVID-19 declaration under Section 564(b)(1) of the Act, 21 U.S.C. section 360bbb-3(b)(1), unless the authorization is terminated or revoked sooner.    Influenza A by PCR NEGATIVE NEGATIVE Final   Influenza B by PCR NEGATIVE NEGATIVE Final    Comment: (NOTE) The Xpert Xpress SARS-CoV-2/FLU/RSV assay is intended as an aid in  the diagnosis of influenza from Nasopharyngeal swab specimens and  should not be used as a sole basis for treatment. Nasal washings and  aspirates are unacceptable for Xpert Xpress SARS-CoV-2/FLU/RSV  testing. Fact Sheet for Patients: https://www.moore.com/https://www.fda.gov/media/142436/download Fact Sheet for Healthcare Providers: https://www.young.biz/https://www.fda.gov/media/142435/download This test is not yet approved or cleared by the [place] FDA and  has been authorized for detection and/or diagnosis of SARS-CoV-2 by  FDA under an Emergency Use Authorization (EUA). This EUA will remain  in effect (meaning this test can be used) for the duration of the  Covid-19 declaration under Section 564(b)(1) of the Act, 21  U.S.C. section  360bbb-3(b)(1), unless the authorization is  terminated or revoked. Performed at [facility], [address] [place], [place] [address]   MRSA PCR Screening     Status: None   Collection Time: 08/06/19  2:37 PM   Specimen: Nasal Mucosa; Nasopharyngeal  Result Value Ref Range Status   MRSA by PCR NEGATIVE NEGATIVE Final    Comment:        The GeneXpert MRSA Assay (FDA approved for NASAL specimens only), is one component of a comprehensive MRSA colonization surveillance program. It is not intended to diagnose MRSA infection nor to guide or monitor treatment for MRSA infections. Performed at [facility], [address] [place], [place] [address]          Radiology Studies: VAS US TRANSCRANIAL DOPPLER  Result Date: 08/14/2019  Transcranial Doppler Indications: Subarachnoid hemorrhage. Comparison Study: 08/12/2019 Performing Technologist: Chanda BusingGregory Collins RVT  Examination Guidelines: A complete evaluation includes B-mode imaging, spectral Doppler, color Doppler, and power Doppler as needed of all accessible portions of each vessel. Bilateral testing is considered an integral part of a complete examination. Limited examinations for reoccurring  indications may be performed as noted.  +----------+-------------+----------+-----------+-------+ RIGHT TCD Right VM (cm)Depth (cm)PulsatilityComment +----------+-------------+----------+-----------+-------+ MCA           66.00                 1.08            +----------+-------------+----------+-----------+-------+ ACA          -22.00                 1.02            +----------+-------------+----------+-----------+-------+ Term ICA      52.00                 1.36            +----------+-------------+----------+-----------+-------+ PCA           34.00                 0.86            +----------+-------------+----------+-----------+-------+ Opthalmic     17.00                 1.32             +----------+-------------+----------+-----------+-------+ ICA siphon    18.00                 1.62            +----------+-------------+----------+-----------+-------+ Vertebral    -19.00                 0.75            +----------+-------------+----------+-----------+-------+  +----------+------------+----------+-----------+-------+ LEFT TCD  Left VM (cm)Depth (cm)PulsatilityComment +----------+------------+----------+-----------+-------+ MCA          71.00                 1.11            +----------+------------+----------+-----------+-------+ ACA          -26.00                1.35            +----------+------------+----------+-----------+-------+ Term ICA     48.00                 1.12            +----------+------------+----------+-----------+-------+ PCA          22.00                 1.01            +----------+------------+----------+-----------+-------+ Opthalmic    21.00                 1.12            +----------+------------+----------+-----------+-------+ ICA siphon   25.00                 1.79            +----------+------------+----------+-----------+-------+ Vertebral    -24.00                1.04            +----------+------------+----------+-----------+-------+  +------------+-------+-------+             VM cm/sComment +------------+-------+-------+ Prox Basilar-29.00  0.93   +------------+-------+-------+ Dist Basilar-37.00  0.91   +------------+-------+-------+ Summary:  Normal mean flow velocities inm majority of identidied vessles of anterior and posterior cerebral circulations. No definite evidence of vasospasm noted. *See  table(s) above for TCD measurements and observations.  Diagnosing physician: Delia Heady MD Electronically signed by Delia Heady MD on 08/14/2019 at 4:03:24 PM.    Final         Scheduled Meds: . aspirin  81 mg Oral Daily  . atorvastatin  40 mg Oral Daily  . Chlorhexidine Gluconate Cloth  6  each Topical Daily  . folic acid  1 mg Oral Daily  . multivitamin with minerals  1 tablet Oral Daily  . nicotine  14 mg Transdermal Daily  . niMODipine  60 mg Oral Q4H  . pantoprazole  40 mg Oral Daily  . pneumococcal 23 valent vaccine  0.5 mL Intramuscular Tomorrow-1000  . senna-docusate  1 tablet Oral BID  . thiamine  100 mg Oral Daily   Or  . thiamine  100 mg Intravenous Daily   Continuous Infusions: . sodium chloride Stopped (08/08/19 1628)     LOS: 9 days    Time spent: 29 minutes spent on chart review, discussion with nursing staff, consultants, updating family and interview/physical exam; more than 50% of that time was spent in counseling and/or coordination of care.    Alvira Philips [place], DO [facility] Available via Epic secure chat 7am-7pm After these hours, please refer to coverage provider listed on [email or web address] 08/15/2019, 10:05 AM

## 2019-08-15 NOTE — Progress Notes (Signed)
Nutrition Brief Note  Pt being examined due to length of stay.   Pt with a PMH significant for EtOH abuse and intracranial aneurysm admitted with SAH 2/2 L MCA bifurcation aneurysm s/p cerebral arteriogram with emergent balloon assisted coiling.   RN in room at time of visit. Pt reporting good appetite now and PTA.   Wt Readings from Last 15 Encounters:  08/06/19 86.2 kg  07/19/19 86.2 kg  06/10/19 87.1 kg    Body mass index is 26.5 kg/m. Patient meets criteria for overweight based on current BMI.   Current diet order is regular, patient is consuming approximately 100% of meals at this time. Labs and medications reviewed.   No nutrition interventions warranted at this time. If nutrition issues arise, please consult RD.   Eugene Gavia, MS, RD, LDN RD pager number and weekend/on-call pager number located in [place].

## 2019-08-15 NOTE — Progress Notes (Signed)
Referring Physician(s):  Jaffe,Adam R  Supervising Physician: Baldemar Lenis  Patient Status:  [facility] - In-pt  Chief Complaint:  "Right arm numbness"  Subjective: Transferred to 3W overnight.  No acute events.  Still not sleeping well which is his main complaint today. [name] awake, sitting up in bed.  Moderate headache this AM which has resolved-- attributes this to sleep interruption.  Continues with hesitant speech, and right arm numbness along the medial aspect.  Doing well with therapy. Asks why he has a bed alarm on.   Allergies: Other  Medications: Prior to Admission medications   Medication Sig Start Date End Date Taking? Authorizing Provider  acetaminophen (TYLENOL) 500 MG tablet Take 1,000 mg by mouth every 6 (six) hours as needed for mild pain or headache.   Yes [provider]  aspirin 81 MG EC tablet Take 81 mg by mouth daily. Swallow whole.   Yes [provider]  ibuprofen (ADVIL) 200 MG tablet Take 400 mg by mouth every 6 (six) hours as needed for moderate pain.   Yes [provider]  ticagrelor (BRILINTA) 90 MG TABS tablet Take 90 mg by mouth daily.    Yes [provider]  diazepam (VALIUM) 5 MG tablet Take 1 tablet 30 mins prior to MRI. May take second dose if needed. Patient not taking: Reported on 07/17/2019 07/02/19   Van Clines, MD     Vital Signs: BP 127/84 (BP Location: Right Arm)   Pulse 63   Temp 98.3 F (36.8 C)   Resp 18   Ht 5\' 11"  (1.803 m)   Wt 190 lb (86.2 kg)   SpO2 99%   BMI 26.50 kg/m   Physical Exam Vitals and nursing note reviewed.  Constitutional:      General: [name] not in acute distress.    Appearance: Normal appearance.  HENT:     Mouth/Throat:     Mouth: Mucous membranes are moist.     Pharynx: Oropharynx is clear.  Cardiovascular:     Rate and Rhythm: Normal rate.  Pulmonary:     Effort: Pulmonary effort is normal. No respiratory distress.  Skin:    General: Skin  is warm and dry.  Neurological:     Mental Status: [name] alert.     Comments: Alert, awake, and oriented x3. Follows all commands. Cognitively intact. Hesitant with speech but expressive aphasia continues to improve. PERRL bilaterally. EOMs intact bilaterally without nystagmus or subjective diplopia. Mild right facial droop stable Tongue midline. Hand grip 5/5 bilaterally, lower extremity strength 5/5 bilaterally. Facial and lower extremity sensation intact and equal bilaterally Decreased sensation of right forearm (posterior/meidal forearm/elbow) compared to left forearm which is constant and unchanged from prior assessment. Sensation intact and equal in lower extremities.  No pronator drift.     Imaging: VAS TRANSCRANIAL DOPPLER  Result Date: 08/14/2019  Transcranial Doppler Indications: Subarachnoid hemorrhage. Comparison Study: 08/12/2019 Performing Technologist: 10/12/2019 RVT  Examination Guidelines: A complete evaluation includes B-mode imaging, spectral Doppler, color Doppler, and power Doppler as needed of all accessible portions of each vessel. Bilateral testing is considered an integral part of a complete examination. Limited examinations for reoccurring indications may be performed as noted.  +----------+-------------+----------+-----------+-------+ RIGHT TCD Right VM (cm)Depth (cm)PulsatilityComment +----------+-------------+----------+-----------+-------+ MCA           66.00                 1.08            +----------+-------------+----------+-----------+-------+  ACA          -22.00                 1.02            +----------+-------------+----------+-----------+-------+ Term ICA      52.00                 1.36            +----------+-------------+----------+-----------+-------+ PCA           34.00                 0.86            +----------+-------------+----------+-----------+-------+ Opthalmic     17.00                 1.32             +----------+-------------+----------+-----------+-------+ ICA siphon    18.00                 1.62            +----------+-------------+----------+-----------+-------+ Vertebral    -19.00                 0.75            +----------+-------------+----------+-----------+-------+  +----------+------------+----------+-----------+-------+ LEFT TCD  Left VM (cm)Depth (cm)PulsatilityComment +----------+------------+----------+-----------+-------+ MCA          71.00                 1.11            +----------+------------+----------+-----------+-------+ ACA          -26.00                1.35            +----------+------------+----------+-----------+-------+ Term ICA     48.00                 1.12            +----------+------------+----------+-----------+-------+ PCA          22.00                 1.01            +----------+------------+----------+-----------+-------+ Opthalmic    21.00                 1.12            +----------+------------+----------+-----------+-------+ ICA siphon   25.00                 1.79            +----------+------------+----------+-----------+-------+ Vertebral    -24.00                1.04            +----------+------------+----------+-----------+-------+  +------------+-------+-------+             VM cm/sComment +------------+-------+-------+ Prox Basilar-29.00  0.93   +------------+-------+-------+ Dist Basilar-37.00  0.91   +------------+-------+-------+ Summary:  Normal mean flow velocities inm majority of identidied vessles of anterior and posterior cerebral circulations. No definite evidence of vasospasm noted. *See table(s) above for TCD measurements and observations.  Diagnosing physician: Delia Heady MD Electronically signed by Delia Heady MD on 08/14/2019 at 4:03:24 PM.    Final    VAS [place] TRANSCRANIAL DOPPLER  Result Date: 08/12/2019  Transcranial Doppler Indications: Subarachnoid hemorrhage. Performing  Technologist: Jeb Levering RDMS, RVT  Examination Guidelines: A  complete evaluation includes B-mode imaging, spectral Doppler, color Doppler, and power Doppler as needed of all accessible portions of each vessel. Bilateral testing is considered an integral part of a complete examination. Limited examinations for reoccurring indications may be performed as noted.  +----------+-------------+----------+-----------+--------------+ RIGHT TCD Right VM (cm)Depth (cm)Pulsatility   Comment     +----------+-------------+----------+-----------+--------------+ MCA           69.00                 1.33                   +----------+-------------+----------+-----------+--------------+ ACA          -45.00                 1.56                   +----------+-------------+----------+-----------+--------------+ Term ICA      37.00                 1.63                   +----------+-------------+----------+-----------+--------------+ PCA           28.00                 0.82                   +----------+-------------+----------+-----------+--------------+ Opthalmic     23.00                  0.7                   +----------+-------------+----------+-----------+--------------+ ICA siphon                                  not visualized +----------+-------------+----------+-----------+--------------+ Vertebral    -15.00                  0.9                   +----------+-------------+----------+-----------+--------------+  +----------+------------+----------+-----------+-------+ LEFT TCD  Left VM (cm)Depth (cm)PulsatilityComment +----------+------------+----------+-----------+-------+ MCA          44.00                 1.38            +----------+------------+----------+-----------+-------+ ACA          -47.00                1.19            +----------+------------+----------+-----------+-------+ Term ICA     40.00                 1.09             +----------+------------+----------+-----------+-------+ PCA          34.00                 1.27            +----------+------------+----------+-----------+-------+ Opthalmic    26.00                 1.22            +----------+------------+----------+-----------+-------+ ICA siphon   37.00                 1.13            +----------+------------+----------+-----------+-------+  Vertebral    -23.00                1.19            +----------+------------+----------+-----------+-------+  +------------+-------+-------+             VM cm/sComment +------------+-------+-------+ Prox Basilar-26.00         +------------+-------+-------+ Dist Basilar-21.00         +------------+-------+-------+ Summary: This was a normal transcranial Doppler study, with normal flow direction and velocity of all identified vessels of the anterior and posterior circulations, with no evidence of stenosis, vasospasm or occlusion. There was no evidence of intracranial disease.  *See table(s) above for TCD measurements and observations.  Diagnosing physician: Antony Contras MD Electronically signed by Antony Contras MD on 08/12/2019 at 2:04:24 PM.    Final     Labs:  CBC: Recent Labs    08/08/19 0219 08/09/19 0916 08/10/19 0559 08/11/19 0628  WBC 7.7 7.4 7.2 7.5  HGB 11.9* 13.2 13.0 13.6  HCT 35.6* 40.0 38.4* 39.9  PLT 246 269 296 338    COAGS: Recent Labs    07/15/19 1344 08/06/19 1217 08/06/19 1244  INR 1.0 1.0 1.0  APTT 29 27 28     BMP: Recent Labs    08/08/19 0219 08/09/19 0916 08/10/19 0559 08/11/19 0628  NA 139 138 139 140  K 4.2 3.7 4.2 3.7  CL 107 106 105 108  CO2 25 22 25  21*  GLUCOSE 105* 104* 101* 99  BUN 15 15 20 15   CALCIUM 8.6* 9.0 9.2 9.2  CREATININE 0.86 0.77 0.96 0.73  GFRNONAA >60 >60 >60 >60  GFRAA >60 >60 >60 >60    LIVER FUNCTION TESTS: Recent Labs    08/06/19 1217  BILITOT 0.9  AST 49*  ALT 46*  ALKPHOS 60  PROT 7.4  ALBUMIN 4.0     Assessment and Plan:  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 Karenann Cai. Assessed at bedside alongside Dr. Karenann Cai.  Stable after transfer to progressive unit overnight.  Continues with aphasia and RUE numbness.  His speech continues to improve, his RUE numbness is stable.  He knows to immediately notify staff with any changes to symptoms. TCD yesterday showed normal velocities and no evidence of vasospasms.  Continue q4 Neuro checks. For TCD tomorrow to evaluate for vasospasm. Continue Nimodipine for vasospasm prophylaxis.  Continue taking Aspirin 81 mg once daily.  Alcohol/tobacco abuse- on CIWA protocol, Nicotine patch in use. Appreciate PT/OT/speech evaluations- continue with SLP/OT outpatient per recommendations. Further plans per [facility]- appreciate assistance with patient. NIR to follow. All patient's questions answered.  Discontinue bed alarm after clearance from unit protocol. Patient appropriate for walks and self transfers from prior assessment.   Electronically Signed: Docia Barrier, PA 08/15/2019, 4:32 PM   I spent a total of 25 Minutes at the the patient's bedside AND on the patient's hospital floor or unit, greater than 50% of which was counseling/coordinating care for ruptured left MCA bifurcation aneurysm s/p coiling.

## 2019-08-15 NOTE — TOC Progression Note (Signed)
Transition of Care [facility]) - Progression Note    Patient Details  Name: Victor Pena MRN: [id number] Date of Birth: 1955/10/15  [facility] CM/SW Contact  Kermit Balo, RN Phone Number: 08/15/2019, 11:22 AM  Clinical Narrative:    Pt sees Mardelle Matte at [facility] on [address]. CM will follow for appt closer to d/c. Pt also selected to attend outpatient rehab at [facility]. Will enter orders closer to d/c.  TOC following.   Expected Discharge Plan: OP Rehab Barriers to Discharge: Continued Medical Work up  Expected Discharge Plan and Services Expected Discharge Plan: OP Rehab   Discharge Planning Services: CM Consult   Living arrangements for the past 2 months: Single Family Home                                       Social Determinants of Health (SDOH) Interventions    Readmission Risk Interventions No flowsheet data found.

## 2019-08-15 NOTE — Progress Notes (Signed)
  Speech Language Pathology Treatment: Cognitive-Linquistic  Patient Details Name: Victor Pena MRN: [id number] DOB: 25-Aug-1955 Today's Date: 08/15/2019 Time: 1215-1225 SLP Time Calculation (min) (ACUTE ONLY): 10 min  Assessment / Plan / Recommendation Clinical Impression  Pt's speech is progressing daily.  Pt completed oral reading task of multisyllabic words with minimal errors in production and immediate self-correction.  Oral paragraph reading is similar, with slow but fluent output in an effort to minimize errors.  There is no language involvement; deficits have been specific to motor planning only. There are no further acute SLP needs.  However, recommend that pt be evaluated by SLP at OP to assess communication in more challenging contests.  Pt agrees with plan.   HPI HPI: 64 y.o. male with a history of intracranial aneurysms and diplopia.  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  He underwent a diagnostic cerebral angiogram on 07/19/2019 that showed a 4.4 x 4.2 mm wide neck left MCA bifurcation aneurysm and a blisterlike right ICA bifurcation aneurysm measuring 5.6 x 3 0.1 mm with superimposed pseudolobation. Pt presented to ED with intermittent dysphasia, HA, and difficulty utilizing R hand. CT revealed SAH. Pt now s/p coiling.      SLP Plan  All goals met       Recommendations                   Oral Care Recommendations: Oral care BID Follow up Recommendations: Outpatient SLP SLP Visit Diagnosis: Apraxia (R48.2) Plan: All goals met       GO                [name] 08/15/2019, 1:06 PM  [name] L. Tivis Ringer, [place] Office number [phone] Pager [phone]

## 2019-08-16 ENCOUNTER — Inpatient Hospital Stay (HOSPITAL_COMMUNITY): Payer: Managed Care, Other (non HMO)

## 2019-08-16 ENCOUNTER — Other Ambulatory Visit (HOSPITAL_COMMUNITY): Payer: Managed Care, Other (non HMO)

## 2019-08-16 DIAGNOSIS — I671 Cerebral aneurysm, nonruptured: Secondary | ICD-10-CM

## 2019-08-16 DIAGNOSIS — R479 Unspecified speech disturbances: Secondary | ICD-10-CM

## 2019-08-16 LAB — GLUCOSE, CAPILLARY: Glucose-Capillary: 99 mg/dL (ref 70–99)

## 2019-08-16 MED ORDER — TICAGRELOR 90 MG PO TABS
90.0000 mg | ORAL_TABLET | Freq: Two times a day (BID) | ORAL | Status: DC
  Administered 2019-08-16 – 2019-08-19 (×7): 90 mg via ORAL
  Filled 2019-08-16 (×7): qty 1

## 2019-08-16 NOTE — Progress Notes (Signed)
STROKE TEAM PROGRESS NOTE   INTERVAL HISTORY Stroke team was advised to see the patient again because of recurrent symptoms .patient had a transient episode this a.m. and 5:00 of garbled speech and speech difficulties which is lasted only a few minutes and resolved.  Patient got a stat CT scan of the head which showed left temporal infarct more advanced than the MRI of more than a week ago.  CT angiogram showed no evidence of vasospasm stenosis with satisfactory coiling of the left MCA aneurysm.  Transcranial Doppler study done today shows no significant evidence of vasospasm.  Vitals:   08/16/19 0100 08/16/19 0319 08/16/19 0531 08/16/19 0853  BP: 129/76 129/82 (!) 123/95 124/75  Pulse: 60 67 72 62  Resp: 15 18 20 16   Temp: 98.2 F (36.8 C) 98.7 F (37.1 C) 97.8 F (36.6 C) 98.1 F (36.7 C)  TempSrc:   Oral Oral  SpO2: 100% 99% 96% 99%  Weight:      Height:        CBC:  Recent Labs  Lab 08/10/19 0559 08/11/19 0628  WBC 7.2 7.5  HGB 13.0 13.6  HCT 38.4* 39.9  MCV 104.6* 103.6*  PLT 296 338    Basic Metabolic Panel:  Recent Labs  Lab 08/10/19 0559 08/11/19 0628  NA 139 140  K 4.2 3.7  CL 105 108  CO2 25 21*  GLUCOSE 101* 99  BUN 20 15  CREATININE 0.96 0.73  CALCIUM 9.2 9.2    IMAGING past 24 hours CT ANGIO HEAD CODE STROKE  Result Date: 08/16/2019 CLINICAL DATA:  Speech difficulty. EXAM: CT ANGIOGRAPHY HEAD TECHNIQUE: Multidetector CT imaging of the head was performed using the standard protocol during bolus administration of intravenous contrast. Multiplanar CT image reconstructions and MIPs were obtained to evaluate the vascular anatomy. CONTRAST:  80 cc Omnipaque 350 intravenous COMPARISON:  Brain MRI from 9 days ago FINDINGS: CT HEAD Brain: Decrease in left perisylvian subarachnoid hemorrhage. 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 No hydrocephalus or extra-axial  collection. Vascular: Left MCA bifurcation aneurysm coil mass Skull: Normal. Negative for fracture or focal lesion. Sinuses: Opacified right posterior ethmoid air cell. Orbits: Negative CTA HEAD Anterior circulation: Left MCA bifurcation aneurysm coil mass with associated streak artifact. No detected residual sac enhancement. The adjacent vessels are patent without visible luminal thrombus, but limited adjacent to the treatment site. No major downstream branch occlusion is seen. No embolic disease seen to the other anterior vessels. 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 Posterior circulation: Symmetric vertebral arteries. The vertebral and basilar arteries are smooth and widely patent. Fetal type right PCA. No branch occlusion, beading, or aneurysm. Venous sinuses: Unremarkable in the arterial phase Anatomic variants: As above These results were communicated to Dr 08/18/2019 at [facility] 5/14/2021by text page via the [facility] messaging system. IMPRESSION: 1. 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 2. Left MCA bifurcation aneurysm coiling with expected regional artifact. 3. Decreasing left perisylvian subarachnoid hemorrhage. Electronically Signed   By: [facility] M.D.   On: 08/16/2019 07:32     PHYSICAL EXAM     General -pleasant middle-age Caucasian male, in no apparent distress.  Ophthalmologic - fundi not visualized due to noncooperation.  Cardiovascular - Regular rhythm and rate.  Mental Status -  Level of arousal and orientation to time, place, and  person were intact. Language exam showed mild expressive aphasia, still significant hesitation, able to name and slow repetition. Comprehension intact. Attention span and concentration were normal. Fund of Knowledge was assessed and was intact.  Cranial Nerves II - XII - II - Visual field intact OU. III, IV, VI - Extraocular movements intact. V -  Facial sensation intact bilaterally. VII -.  No facial droop noted today.   VIII - Hearing & vestibular intact bilaterally. X - Palate elevates symmetrically. XI - Chin turning & shoulder shrug intact bilaterally. XII - Tongue protrusion intact.  Motor Strength - The patient's strength was normal in all extremities and pronator drift was absent mild right grip weakness..  Bulk was normal and fasciculations were absent.   Motor Tone - Muscle tone was assessed at the neck and appendages and was normal.  Diminished fine finger movements on the right compared to the left. Reflexes - The patient's reflexes were symmetrical in all extremities and he had no pathological reflexes.  Sensory - Light touch, temperature/pinprick were assessed and were symmetrical.    Coordination - The patient had normal movements in the hands with no ataxia or dysmetria.  Tremor was absent.  Gait and Station - deferred.   ASSESSMENT/PLAN Mr. DELMO MATTY is a 64 y.o. 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 He was taking aspirin and brilinta as of 4/28 and scheduled for an endovascular procedure to treat his RIGHT sided aneurysm 5/5, however, on 5/4 he presented with intermittent dysphagia, R hand numbness and L occipital HA. CT showed L frontoparietal SAH.  He was taken to IR for aneurysm securement -> s/p balloon assisted coiling.  The patient is being kept in the ICU to monitor for post hemorrhage vasospasms.  [name] on amlodipine.  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   Cerebral angio 4/16 - blister like aneurysm right ICA paraophthalmic segment 5.33mm. and left MCA bifurcation saccular aneurysm wide neck 4.99mm  Cerebral angio 5/4 - 4.56mm wide neck L MCA bifurcation aneurysm s/p balloon assisted coiling.   MRI  Scattered SAH L sylvian fissure and L frontoparietal convexity stable. Probable few punctate L frontal and parietal lobe infarcts, other DWI abnormality d/t SAH. L MCA coil.  CTA head 5/14 small cortical posterior L temporal lobe and low parietal cortex infarcts new from MRI. L MCA coiling. Decreasing L perisylvian SAH.    2D Echo EF 60-65%. No source of embolus   TCD MWF - have been normal. For repeat today.   EEG no sz;   repeat EEG   pending  LDL 149  HgbA1c 5.2   On nimodipine 60 mg q4h x 21 days  SCDs for VTE prophylaxis  aspirin 81 mg daily and Brilinta 90 daily prior to admission, on aspirin 81 mg daily. Stopped Brilinta.   Therapy recommendations:  No PT - OP OT, OP SLP  Disposition:  return home   On the Progressive unit  Follow up Dr. Everlena Cooper  Blood Pressure  Home meds:  None, no hx HTN  Off Cleviprex  Labetalol PRN . SBP goal < 160  . Long-term BP goal normotensive  Hyperlipidemia  Home meds:  No statin  LDL 149  On lipitor 40  Continue on discharge  Alcohol use  ETOH use, alcohol intake elevated lately  Advised to drink no more than 2 drink(s) a day.   Placed on CIWA protocol  (prn ativan)  FA/B1/MVI  Seizure precaution  Tobacco abuse  Current smoker  Smoking cessation counseling provided  Nicotine patch provided   Pt is willing to quit  Other Stroke Risk Factors  Family hx aneurysm (father)   Other Active Problems  Code status - Full code  Conductive hearing loss in left ear  Hospital day # 10  Recommend check EEG for seizure activity.  Continue aspirin and Brilinta for stroke prevention given aneurysm coiling.  Continue to check transcranial Doppler studies Monday Wednesday Friday..  Discussed with Dr. Darene Lamer.  Greater than 50% time during this 25-minute visit was spent in counseling and coordination of care discussion with care team Antony Contras, MD  To contact Stroke Continuity provider, please refer to  [email or web address] After hours, contact General Neurology

## 2019-08-16 NOTE — Progress Notes (Signed)
PROGRESS NOTE    Victor Pena  JSH:[id number] DOB: [birth year] DOA: 08/06/2019 PCP: Patient, No Pcp Per    Brief Narrative:  Victor Pena is a 64 y.o. male with medical history significant of intracranial aneurysm who initially presented with complaints of difficulty speaking with right hand weakness and numbness.  CT scan of the brain revealed a subarachnoid bleed or patient was noted to have a left MCA aneurysm.  IR was consulted and performed cerebral arteriogram with emergent balloon assisted coiling on 5/4 by Dr. Sherlon Handing.  Since that time patient expressive aphasia has continued to improve daily.  Patient is to remain in hospital for total of 14 days to monitor for vasospasm with daily transcranial Dopplers.   Assessment & Plan:   Active Problems:   Subarachnoid hemorrhage (HCC)   Brain aneurysm   Difficulty with speech  Aneurysmal subarachnoid hemorrhagestatus post coiling on 5/4  Patient presenting with dysphagia was found to have a subarachnoid hemorrhage with a ruptured left MCA bifurcation patient aneurysm, patient underwent cooling by interventional radiology on 08/06/2019.  CT angiogram head 5/14 notable for decreased size of perisylvian subarachnoid hemorrhage. --Continue close monitoring by IR/neurology for spasm; Day #10 of 14 --Continue aspirin, Lipitor, amlodipine --Nimodipine 60mg  PO q4h; plan 21-day course --Repeat CTA head plan for 5/17  Acute left pleural/parietal CVA Patient this morning with slurred speech, had CT angiogram head with small cortical based infarct clustered in the posterior left temporal and low parietal cortex which is new since brain MR 9 days prior; also notable decreased in perisylvian SAH. EEG within normal limits, no seizures or epileptiform discharges noted --Restarting Brilinta 90 mg p.o. twice daily today --Interventional neurology recommends repeating CT angiogram head on Monday; will be n.p.o. Sunday evening  Hyperlipidemia LDL 149,  on Lipitor 40 mg  Metabolic acidosis Likely from dehydration.  Alcohol abuse No signs of withdrawal symptoms.  Counseled on need for cessation. --continue thiamine, folic acid, multivitamin  Tobacco abuse Counseled on need for cessation. --Nicotine patch  Macrocytosis B12 132, low --Will start B12 Wednesday PO daily  DVT prophylaxis: SCDs Code Status: Full code Family Communication: No family present at bedside  Disposition Plan:  Status is: Inpatient  Remains inpatient appropriate because:Ongoing diagnostic testing needed not appropriate for outpatient work up and Inpatient level of care appropriate due to severity of illness patient needs 14 days of close monitoring following subarachnoid hemorrhage status post coiling for frequent transcranial Dopplers monitoring for vasospasm   Dispo: The patient is from: Home              Anticipated d/c is to: Home              Anticipated d/c date is: > 3 days              Patient currently is not medically stable to d/c.    Consultants:   Interventional radiology  Neurology  Procedures:   Left frontoparietal SAH secondary to left MCA aneurysm rupture status post emergent coiling 5/4  Antimicrobials:   None   Subjective: Patient seen and examined bedside, resting comfortably.  Earlier this morning, patient with slurred speech and emergent CT angiogram head performed with acute new infarcts left temporal and low parietal lobe.  Speech now has improved and resolved to baseline, no other focal neurological deficits appreciated.  Interventional neurology present at bedside, plans to restart Brilinta and plans CT angiogram head on Monday.  Patient with no other specific complaints or concerns at this  time. Denies headache, no visual changes, no chest pain, no palpitations, no shortness of breath, no nausea/vomiting/diarrhea, no fever/chills/night sweats, no weakness.  No other acute events overnight per nursing  staff.  Objective: Vitals:   08/16/19 0319 08/16/19 0531 08/16/19 0853 08/16/19 1153  BP: 129/82 (!) 123/95 124/75 133/80  Pulse: 67 72 62 62  Resp: 18 20 16 20   Temp: 98.7 F (37.1 C) 97.8 F (36.6 C) 98.1 F (36.7 C) 98.4 F (36.9 C)  TempSrc:  Oral Oral   SpO2: 99% 96% 99% 100%  Weight:      Height:        Intake/Output Summary (Last 24 hours) at 08/16/2019 1202 Last data filed at 08/16/2019 0800 Gross per 24 hour  Intake 390 ml  Output --  Net 390 ml   Filed Weights   08/06/19 1213  Weight: 86.2 kg    Examination:  General exam: Appears calm and comfortable  Respiratory system: Clear to auscultation. Respiratory effort normal. Cardiovascular system: S1 & S2 heard, RRR. No JVD, murmurs, rubs, gallops or clicks. No pedal edema. Gastrointestinal system: Abdomen is nondistended, soft and nontender. No organomegaly or masses felt. Normal bowel sounds heard. Central nervous system: Alert and oriented. No focal neurological deficits. Extremities: Symmetric 5 x 5 power. Skin: No rashes, lesions or ulcers Psychiatry: Judgement and insight appear normal. Mood & affect appropriate.     Data Reviewed: I have personally reviewed following labs and imaging studies  CBC: Recent Labs  Lab 08/10/19 0559 08/11/19 0628  WBC 7.2 7.5  HGB 13.0 13.6  HCT 38.4* 39.9  MCV 104.6* 103.6*  PLT 296 338   Basic Metabolic Panel: Recent Labs  Lab 08/10/19 0559 08/11/19 0628  NA 139 140  K 4.2 3.7  CL 105 108  CO2 25 21*  GLUCOSE 101* 99  BUN 20 15  CREATININE 0.96 0.73  CALCIUM 9.2 9.2   GFR: Estimated Creatinine Clearance: 99.4 mL/min (by C-G formula based on SCr of 0.73 mg/dL). Liver Function Tests: No results for input(s): AST, ALT, ALKPHOS, BILITOT, PROT, ALBUMIN in the last 168 hours. No results for input(s): LIPASE, AMYLASE in the last 168 hours. No results for input(s): AMMONIA in the last 168 hours. Coagulation Profile: No results for input(s): INR, PROTIME in  the last 168 hours. Cardiac Enzymes: No results for input(s): CKTOTAL, CKMB, CKMBINDEX, TROPONINI in the last 168 hours. BNP (last 3 results) No results for input(s): PROBNP in the last 8760 hours. HbA1C: No results for input(s): HGBA1C in the last 72 hours. CBG: Recent Labs  Lab 08/16/19 0549  GLUCAP 99   Lipid Profile: No results for input(s): CHOL, HDL, LDLCALC, TRIG, CHOLHDL, LDLDIRECT in the last 72 hours. Thyroid Function Tests: Recent Labs    08/15/19 0417  TSH 3.652   Anemia Panel: Recent Labs    08/15/19 0417  VITAMINB12 132*   Sepsis Labs: No results for input(s): PROCALCITON, LATICACIDVEN in the last 168 hours.  Recent Results (from the past 240 hour(s))  Respiratory Panel by RT PCR (Flu A&B, Covid) - Nasopharyngeal Swab     Status: None   Collection Time: 08/06/19  1:05 PM   Specimen: Nasopharyngeal Swab  Result Value Ref Range Status   SARS Coronavirus 2 by RT PCR NEGATIVE NEGATIVE Final    Comment: (NOTE) SARS-CoV-2 target nucleic acids are NOT DETECTED. The SARS-CoV-2 RNA is generally detectable in upper respiratoy specimens during the acute phase of infection. The lowest concentration of SARS-CoV-2 viral copies this assay  can detect is 131 copies/mL. A negative result does not preclude SARS-Cov-2 infection and should not be used as the sole basis for treatment or other patient management decisions. A negative result may occur with  improper specimen collection/handling, submission of specimen other than nasopharyngeal swab, presence of viral mutation(s) within the areas targeted by this assay, and inadequate number of viral copies (<131 copies/mL). A negative result must be combined with clinical observations, patient history, and epidemiological information. The expected result is Negative. Fact Sheet for Patients:  PinkCheek.be Fact Sheet for Healthcare Providers:  GravelBags.it This test  is not yet ap proved or cleared by the [place] FDA and  has been authorized for detection and/or diagnosis of SARS-CoV-2 by FDA under an Emergency Use Authorization (EUA). This EUA will remain  in effect (meaning this test can be used) for the duration of the COVID-19 declaration under Section 564(b)(1) of the Act, 21 U.S.C. section 360bbb-3(b)(1), unless the authorization is terminated or revoked sooner.    Influenza A by PCR NEGATIVE NEGATIVE Final   Influenza B by PCR NEGATIVE NEGATIVE Final    Comment: (NOTE) The Xpert Xpress SARS-CoV-2/FLU/RSV assay is intended as an aid in  the diagnosis of influenza from Nasopharyngeal swab specimens and  should not be used as a sole basis for treatment. Nasal washings and  aspirates are unacceptable for Xpert Xpress SARS-CoV-2/FLU/RSV  testing. Fact Sheet for Patients: PinkCheek.be Fact Sheet for Healthcare Providers: GravelBags.it This test is not yet approved or cleared by the [place] FDA and  has been authorized for detection and/or diagnosis of SARS-CoV-2 by  FDA under an Emergency Use Authorization (EUA). This EUA will remain  in effect (meaning this test can be used) for the duration of the  Covid-19 declaration under Section 564(b)(1) of the Act, 21  U.S.C. section 360bbb-3(b)(1), unless the authorization is  terminated or revoked. Performed at [facility], [place] [address] [place], [place] [address]   MRSA PCR Screening     Status: None   Collection Time: 08/06/19  2:37 PM   Specimen: Nasal Mucosa; Nasopharyngeal  Result Value Ref Range Status   MRSA by PCR NEGATIVE NEGATIVE Final    Comment:        The GeneXpert MRSA Assay (FDA approved for NASAL specimens only), is one component of a comprehensive MRSA colonization surveillance program. It is not intended to diagnose MRSA infection nor to guide or monitor treatment for MRSA infections. Performed  at [facility], [place] [address] [place], [place] [address]          Radiology Studies: EEG  Result Date: 08/16/2019 [name], MD     08/16/2019 11:56 AM Patient Name: Victor Pena MRN: [id number] Epilepsy Attending: [name] Referring Physician/Provider: Dr. 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  EEG evaluate for seizures. Level of alertness: Awake, asleep AEDs during EEG study: None Technical aspects: This EEG study was done with scalp electrodes positioned according to the 10-20 International system of electrode placement. Electrical activity was acquired at a sampling rate of 500Hz  and reviewed with a high frequency filter of 70Hz  and a low frequency filter of 1Hz . EEG data were recorded continuously and digitally stored. Description: The posterior dominant rhythm consists of 9-10 Hz activity of moderate voltage (  25-35 uV) seen predominantly in posterior head regions, symmetric and reactive to eye opening and eye closing.  Sleep was characterized by vertex waves, sleep spindles (12 to 14 Hz), maximal frontocentral region.  Hyperventilation and photic stimulation were not performed.   IMPRESSION: This study is within normal limits. No seizures or epileptiform discharges were seen throughout the recording. Priyanka Annabelle Harman   CT ANGIO HEAD CODE STROKE  Result Date: 08/16/2019 CLINICAL DATA:  Speech difficulty. EXAM: CT ANGIOGRAPHY HEAD TECHNIQUE: Multidetector CT imaging of the head was performed using the standard protocol during bolus administration of intravenous contrast. Multiplanar CT image reconstructions and MIPs were obtained to evaluate the vascular anatomy. CONTRAST:  80 cc Omnipaque 350 intravenous COMPARISON:  Brain MRI from 9 days  ago FINDINGS: CT HEAD Brain: Decrease in left perisylvian subarachnoid hemorrhage. 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 No hydrocephalus or extra-axial collection. Vascular: Left MCA bifurcation aneurysm coil mass Skull: Normal. Negative for fracture or focal lesion. Sinuses: Opacified right posterior ethmoid air cell. Orbits: Negative CTA HEAD Anterior circulation: Left MCA bifurcation aneurysm coil mass with associated streak artifact. No detected residual sac enhancement. The adjacent vessels are patent without visible luminal thrombus, but limited adjacent to the treatment site. No major downstream branch occlusion is seen. No embolic disease seen to the other anterior vessels. 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 Posterior circulation: Symmetric vertebral arteries. The vertebral and basilar arteries are smooth and widely patent. Fetal type right PCA. No branch occlusion, beading, or aneurysm. Venous sinuses: Unremarkable in the arterial phase Anatomic variants: As above These results were communicated to Dr Antionette Char at [facility] 5/14/2021by text page via the [facility] messaging system. IMPRESSION: 1. 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 2. Left MCA bifurcation aneurysm coiling with expected regional artifact. 3. Decreasing left perisylvian subarachnoid hemorrhage. Electronically Signed   By: Marnee Spring M.D.   On: 08/16/2019 07:32        Scheduled Meds: . aspirin  81 mg Oral Daily  . atorvastatin  40 mg Oral Daily  . folic acid  1 mg Oral Daily  . multivitamin with minerals  1 tablet Oral Daily  . nicotine  14 mg Transdermal Daily  . niMODipine  60 mg Oral Q4H  . pantoprazole  40 mg Oral Daily  . pneumococcal 23 valent vaccine  0.5 mL Intramuscular Tomorrow-1000  . senna-docusate  1 tablet  Oral BID  . thiamine  100 mg Oral Daily   Or  . thiamine  100 mg Intravenous Daily  . ticagrelor  90 mg Oral BID  . vitamin B-12  1,000 mcg Oral Daily   Continuous Infusions:    LOS: 10 days    Time spent: 36 minutes spent on chart review, discussion with nursing staff, consultants, updating family and interview/physical exam; more than 50% of that time was spent in counseling and/or coordination of care.    Alvira Philips [place], DO [facility] Available via Epic secure chat 7am-7pm After these hours, please refer to coverage provider listed on [email or web address] 08/16/2019, 12:02 PM

## 2019-08-16 NOTE — Progress Notes (Signed)
EEG completed, results pending. 

## 2019-08-16 NOTE — Progress Notes (Signed)
Patient with subarachnoid hemorrhage secondary left MCA aneurysm status post coiling on 08/06/19-had abrupt onset of garbled speech this morning.  At baseline has some mild aphasia per note by stroke service.  Nurse initially called me-asked nurse to notify interventional team of neurological change. Code stroke was activated-assessed the patient on the way to CT scanner.  Speech was improving, alert and oriented-following commands.  No obvious aphasia on exam and making sense while speaking.  Able to read.  No focal deficits.  Dr. Quay Burow requested CT head and CT angiogram-we will follow up on study.

## 2019-08-16 NOTE — Significant Event (Signed)
Victor Pena was admitted 9 days ago with speech difficulty and RUE weakness and numbness, found to have SAH related to left MCA aneurysm, and underwent coiling by Dr. Tommie Sams on 08/06/19.    Rapid response and primary RN are at the bedside. Patient appears well but hesitant and stuttering speech noted. His speech had reportedly been improving until ~5 am when he noted increased difficulty.   Dr. Tommie Sams was contacted and recommended head CT and CTA head. [name] on his way to CT now.

## 2019-08-16 NOTE — Code Documentation (Signed)
Responded to Code Stroke called at 0542 for garbled speech, [id number]. Pt here with SAH and L MCA aneurysm. This was coiled on 5/4. Per notes, pt has had aphasia and R droop since admission. Per pt, his speech has been fine for days until this morning at 0500 when he noticed it was considerably worse.  Dr. Laurence Slate spoke with RN PTA RRT and asked her to call the Neuro IR MD on call.   At that time, Code Stroke was paged out by bedside RN.  On assessment, pt alert and oriented. Pt says he is having trouble finding the word he wishes to say. [name] stuttering his speech and hesitates while talking. T-97.8, HR-72, 123/95, RR-20, SpO2-96% on RA, CBG-99, NIH-2 for mild aphasia and R facial droop.  Dr. Antionette Char to bedside and spoke to Dr. Quay Burow with Neuro IR. CT and CTA head ordered. Pt transported to CT and back.  Plan: Await CT/CTA results. Continue to monitor pt. Call RRT if further assistance needed.

## 2019-08-16 NOTE — Progress Notes (Signed)
Referring Physician(s):  Jaffe,Adam R  Supervising Physician: Baldemar Lenis  Patient Status:  [facility] - In-pt  Chief Complaint:  "Right arm numbness"  Subjective: Reported increased garbled speech this AM around 0500 which resolved quickly.  Code Stroke was called.   CTA Head showed: 1. 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 2. Left MCA bifurcation aneurysm coiling with expected regional artifact. 3. Decreasing left perisylvian subarachnoid hemorrhage.  Upon assessment this AM, he appears to be back to baseline with mild R facial droop, word-finding difficulty, and R arm numbness.   Allergies: Other  Medications: Prior to Admission medications   Medication Sig Start Date End Date Taking? Authorizing Provider  acetaminophen (TYLENOL) 500 MG tablet Take 1,000 mg by mouth every 6 (six) hours as needed for mild pain or headache.   Yes [provider]  aspirin 81 MG EC tablet Take 81 mg by mouth daily. Swallow whole.   Yes [provider]  ibuprofen (ADVIL) 200 MG tablet Take 400 mg by mouth every 6 (six) hours as needed for moderate pain.   Yes [provider]  ticagrelor (BRILINTA) 90 MG TABS tablet Take 90 mg by mouth daily.    Yes [provider]  diazepam (VALIUM) 5 MG tablet Take 1 tablet 30 mins prior to MRI. May take second dose if needed. Patient not taking: Reported on 07/17/2019 07/02/19   Van Clines, MD     Vital Signs: BP 124/75 (BP Location: Right Arm)   Pulse 62   Temp 98.1 F (36.7 C) (Oral)   Resp 16   Ht 5\' 11"  (1.803 m)   Wt 190 lb (86.2 kg)   SpO2 99%   BMI 26.50 kg/m   Physical Exam Vitals and nursing note reviewed.  Constitutional:      General: [name] not in acute distress.    Appearance: Normal appearance.  HENT:     Mouth/Throat:     Mouth: Mucous membranes are moist.     Pharynx: Oropharynx is  clear.  Cardiovascular:     Rate and Rhythm: Normal rate.  Pulmonary:     Effort: Pulmonary effort is normal. No respiratory distress.  Skin:    General: Skin is warm and dry.  Neurological:     Mental Status: [name] alert.     Comments: Alert, awake, and oriented x3. Follows all commands. Cognitively intact. Hesitant with speech but expressive aphasia continues to improve. PERRL bilaterally. EOMs intact bilaterally without nystagmus or subjective diplopia. Mild right facial droop stable Tongue midline. Hand grip 5/5 bilaterally, lower extremity strength 5/5 bilaterally. Facial and lower extremity sensation intact and equal bilaterally Decreased sensation of right forearm (posterior/meidal forearm/elbow) compared to left forearm which is constant and unchanged from prior assessment. Sensation intact and equal in lower extremities.  No pronator drift.     Imaging: VAS TRANSCRANIAL DOPPLER  Result Date: 08/14/2019  Transcranial Doppler Indications: Subarachnoid hemorrhage. Comparison Study: 08/12/2019 Performing Technologist: 10/12/2019 RVT  Examination Guidelines: A complete evaluation includes B-mode imaging, spectral Doppler, color Doppler, and power Doppler as needed of all accessible portions of each vessel. Bilateral testing is considered an integral part of a complete examination. Limited examinations for reoccurring indications may be performed as noted.  +----------+-------------+----------+-----------+-------+ RIGHT TCD Right VM (cm)Depth (cm)PulsatilityComment +----------+-------------+----------+-----------+-------+ MCA           66.00  1.08            +----------+-------------+----------+-----------+-------+ ACA          -22.00                 1.02            +----------+-------------+----------+-----------+-------+ Term ICA      52.00                 1.36            +----------+-------------+----------+-----------+-------+ PCA            34.00                 0.86            +----------+-------------+----------+-----------+-------+ Opthalmic     17.00                 1.32            +----------+-------------+----------+-----------+-------+ ICA siphon    18.00                 1.62            +----------+-------------+----------+-----------+-------+ Vertebral    -19.00                 0.75            +----------+-------------+----------+-----------+-------+  +----------+------------+----------+-----------+-------+ LEFT TCD  Left VM (cm)Depth (cm)PulsatilityComment +----------+------------+----------+-----------+-------+ MCA          71.00                 1.11            +----------+------------+----------+-----------+-------+ ACA          -26.00                1.35            +----------+------------+----------+-----------+-------+ Term ICA     48.00                 1.12            +----------+------------+----------+-----------+-------+ PCA          22.00                 1.01            +----------+------------+----------+-----------+-------+ Opthalmic    21.00                 1.12            +----------+------------+----------+-----------+-------+ ICA siphon   25.00                 1.79            +----------+------------+----------+-----------+-------+ Vertebral    -24.00                1.04            +----------+------------+----------+-----------+-------+  +------------+-------+-------+             VM cm/sComment +------------+-------+-------+ Prox Basilar-29.00  0.93   +------------+-------+-------+ Dist Basilar-37.00  0.91   +------------+-------+-------+ Summary:  Normal mean flow velocities inm majority of identidied vessles of anterior and posterior cerebral circulations. No definite evidence of vasospasm noted. *See table(s) above for TCD measurements and observations.  Diagnosing physician: Antony Contras MD Electronically signed by Antony Contras MD on  08/14/2019 at 4:03:24 PM.    Final    VAS [place] TRANSCRANIAL DOPPLER  Result Date: 08/12/2019  Transcranial Doppler  Indications: Subarachnoid hemorrhage. Performing Technologist: Jeb LeveringJill Parker RDMS, RVT  Examination Guidelines: A complete evaluation includes B-mode imaging, spectral Doppler, color Doppler, and power Doppler as needed of all accessible portions of each vessel. Bilateral testing is considered an integral part of a complete examination. Limited examinations for reoccurring indications may be performed as noted.  +----------+-------------+----------+-----------+--------------+ RIGHT TCD Right VM (cm)Depth (cm)Pulsatility   Comment     +----------+-------------+----------+-----------+--------------+ MCA           69.00                 1.33                   +----------+-------------+----------+-----------+--------------+ ACA          -45.00                 1.56                   +----------+-------------+----------+-----------+--------------+ Term ICA      37.00                 1.63                   +----------+-------------+----------+-----------+--------------+ PCA           28.00                 0.82                   +----------+-------------+----------+-----------+--------------+ Opthalmic     23.00                  0.7                   +----------+-------------+----------+-----------+--------------+ ICA siphon                                  not visualized +----------+-------------+----------+-----------+--------------+ Vertebral    -15.00                  0.9                   +----------+-------------+----------+-----------+--------------+  +----------+------------+----------+-----------+-------+ LEFT TCD  Left VM (cm)Depth (cm)PulsatilityComment +----------+------------+----------+-----------+-------+ MCA          44.00                 1.38            +----------+------------+----------+-----------+-------+ ACA          -47.00                 1.19            +----------+------------+----------+-----------+-------+ Term ICA     40.00                 1.09            +----------+------------+----------+-----------+-------+ PCA          34.00                 1.27            +----------+------------+----------+-----------+-------+ Opthalmic    26.00                 1.22            +----------+------------+----------+-----------+-------+ ICA siphon   37.00  1.13            +----------+------------+----------+-----------+-------+ Vertebral    -23.00                1.19            +----------+------------+----------+-----------+-------+  +------------+-------+-------+             VM cm/sComment +------------+-------+-------+ Prox Basilar-26.00         +------------+-------+-------+ Dist Basilar-21.00         +------------+-------+-------+ Summary: This was a normal transcranial Doppler study, with normal flow direction and velocity of all identified vessels of the anterior and posterior circulations, with no evidence of stenosis, vasospasm or occlusion. There was no evidence of intracranial disease.  *See table(s) above for TCD measurements and observations.  Diagnosing physician: Delia Heady MD Electronically signed by Delia Heady MD on 08/12/2019 at 2:04:24 PM.    Final    CT ANGIO HEAD CODE STROKE  Result Date: 08/16/2019 CLINICAL DATA:  Speech difficulty. EXAM: CT ANGIOGRAPHY HEAD TECHNIQUE: Multidetector CT imaging of the head was performed using the standard protocol during bolus administration of intravenous contrast. Multiplanar CT image reconstructions and MIPs were obtained to evaluate the vascular anatomy. CONTRAST:  80 cc Omnipaque 350 intravenous COMPARISON:  Brain MRI from 9 days ago FINDINGS: CT HEAD Brain: Decrease in left perisylvian subarachnoid hemorrhage. Small cortically based infarcts have occurred along the posterior left temporal and low parietal convexity, new  since comparison brain MRI. No hydrocephalus or extra-axial collection. Vascular: Left MCA bifurcation aneurysm coil mass Skull: Normal. Negative for fracture or focal lesion. Sinuses: Opacified right posterior ethmoid air cell. Orbits: Negative CTA HEAD Anterior circulation: Left MCA bifurcation aneurysm coil mass with associated streak artifact. No detected residual sac enhancement. The adjacent vessels are patent without visible luminal thrombus, but limited adjacent to the treatment site. No major downstream branch occlusion is seen. No embolic disease seen to the other anterior vessels. 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 Posterior circulation: Symmetric vertebral arteries. The vertebral and basilar arteries are smooth and widely patent. Fetal type right PCA. No branch occlusion, beading, or aneurysm. Venous sinuses: Unremarkable in the arterial phase Anatomic variants: As above These results were communicated to Dr Antionette Char at [facility] 5/14/2021by text page via the [facility] messaging system. IMPRESSION: 1. 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 2. Left MCA bifurcation aneurysm coiling with expected regional artifact. 3. Decreasing left perisylvian subarachnoid hemorrhage. Electronically Signed   By: Marnee Spring M.D.   On: 08/16/2019 07:32    Labs:  CBC: Recent Labs    08/08/19 0219 08/09/19 0916 08/10/19 0559 08/11/19 0628  WBC 7.7 7.4 7.2 7.5  HGB 11.9* 13.2 13.0 13.6  HCT 35.6* 40.0 38.4* 39.9  PLT 246 269 296 338    COAGS: Recent Labs    07/15/19 1344 08/06/19 1217 08/06/19 1244  INR 1.0 1.0 1.0  APTT 29 27 28     BMP: Recent Labs    08/08/19 0219 08/09/19 0916 08/10/19 0559 08/11/19 0628  NA 139 138 139 140  K 4.2 3.7 4.2 3.7  CL 107 106 105 108  CO2 25 22 25  21*  GLUCOSE 105* 104* 101* 99  BUN 15 15 20 15   CALCIUM 8.6* 9.0 9.2 9.2  CREATININE 0.86 0.77 0.96 0.73   GFRNONAA >60 >60 >60 >60  GFRAA >60 >60 >60 >60    LIVER FUNCTION TESTS:  Recent Labs    08/06/19 1217  BILITOT 0.9  AST 49*  ALT 46*  ALKPHOS 60  PROT 7.4  ALBUMIN 4.0    Assessment and Plan:  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 Tommie Sams. Assessed at bedside alongside Dr. Tommie Sams.  Back to baseline after garbled speech this AM.  Continues with aphasia and RUE numbness.  His RUE numbness is stable. Hand grip strength on the right continues to improve- no discernable difference between right and left today.   Events and imaging from this AM are reviewed by Dr. Tommie Sams and explained to the patient.  Of immediate concern is the presence of new posterior infarct which could be related to the amount of exposed coil in his wide-neck aneurysm.  Restart Brilinta 90 mg Plan for cerebral angiogram with possible angioplasty/stent placement as early as Monday of next week. Patient is aware and is agreeable to proceed.   Continue q4 Neuro checks. TCD today to evaluate for vasospasm. Continue Nimodipine for vasospasm prophylaxis.  Continue taking Aspirin 81 mg once daily.  Alcohol/tobacco abuse- on CIWA protocol, Nicotine patch in use. Appreciate PT/OT/speech evaluations- continue with SLP/OT outpatient per recommendations. Further plans per [facility]- appreciate assistance with patient.   Electronically Signed: Hoyt Koch, PA 08/16/2019, 11:35 AM   I spent a total of 25 Minutes at the the patient's bedside AND on the patient's hospital floor or unit, greater than 50% of which was counseling/coordinating care for ruptured left MCA bifurcation aneurysm s/p coiling.

## 2019-08-16 NOTE — Progress Notes (Signed)
Transcranial Doppler  Date POD PCO2 HCT BP  MCA ACA PCA OPHT SIPH VERT Basilar  5/5     Right  Left                                       5/7     Right  Left                                       5/10     Right  Left   69  44   -45  -47   28  34   23  26   *  37   -15  -23   -26      5/12 GC      Right  Left   66  71   -22  -26   -34  -22   17  21   18  25    -19  -24   -37      5/14 MR      Right  Left   71  63   -38  -46   20  39   23  27   -42  43   -14  -18   -19           Right  Left                                            Right  Left                                        MCA = Middle Cerebral Artery      OPHT = Opthalmic Artery     BASILAR = Basilar Artery   ACA = Anterior Cerebral Artery     SIPH = Carotid Siphon PCA = Posterior Cerebral Artery   VERT = Verterbral Artery                   Normal MCA = 62+\-12 ACA = 50+\-12 PCA = 42+\-23   6/14 08/16/2019 12:58 PM

## 2019-08-16 NOTE — Procedures (Signed)
Patient Name: Victor Pena  MRN: [id number]  Epilepsy Attending: Charlsie Quest  Referring Physician/Provider: Dr. Delia Heady Date: 08/16/2019 Duration: 24.28 minutes  Patient history: 64 year old male with subarachnoid hemorrhage secondary to left MCA aneurysm status post coiling with mild aphasia at baseline and abrupt onset of garbled speech which improved after some time in morning.  EEG evaluate for seizures.  Level of alertness: Awake, asleep  AEDs during EEG study: None  Technical aspects: This EEG study was done with scalp electrodes positioned according to the 10-20 International system of electrode placement. Electrical activity was acquired at a sampling rate of 500Hz  and reviewed with a high frequency filter of 70Hz  and a low frequency filter of 1Hz . EEG data were recorded continuously and digitally stored.   Description: The posterior dominant rhythm consists of 9-10 Hz activity of moderate voltage (25-35 uV) seen predominantly in posterior head regions, symmetric and reactive to eye opening and eye closing.  Sleep was characterized by vertex waves, sleep spindles (12 to 14 Hz), maximal frontocentral region.  Hyperventilation and photic stimulation were not performed.     IMPRESSION: This study is within normal limits. No seizures or epileptiform discharges were seen throughout the recording.   Victor Pena 

## 2019-08-17 NOTE — Progress Notes (Signed)
Referring Physician(s):  Jaffe,Adam R  Supervising Physician: Pedro Earls  Patient Status:  [facility] - In-pt  Chief Complaint:  "Right arm numbness"  Subjective: Assessed at bedside this AM. No new event of acute changes.  TCD from yesterday shows normal velocity.  Resumed Brilinta without issue thus far.   Allergies: Other  Medications: Prior to Admission medications   Medication Sig Start Date End Date Taking? Authorizing Provider  acetaminophen (TYLENOL) 500 MG tablet Take 1,000 mg by mouth every 6 (six) hours as needed for mild pain or headache.   Yes [provider]  aspirin 81 MG EC tablet Take 81 mg by mouth daily. Swallow whole.   Yes [provider]  ibuprofen (ADVIL) 200 MG tablet Take 400 mg by mouth every 6 (six) hours as needed for moderate pain.   Yes [provider]  ticagrelor (BRILINTA) 90 MG TABS tablet Take 90 mg by mouth daily.    Yes [provider]  diazepam (VALIUM) 5 MG tablet Take 1 tablet 30 mins prior to MRI. May take second dose if needed. Patient not taking: Reported on 07/17/2019 07/02/19   [name], MD     Vital Signs: BP 124/66 (BP Location: Right Arm)   Pulse (!) 56   Temp 97.8 F (36.6 C) (Oral)   Resp 18   Ht 5\' 11"  (1.803 m)   Wt 190 lb (86.2 kg)   SpO2 99%   BMI 26.50 kg/m   Physical Exam Vitals and nursing note reviewed.  Constitutional:      General: [name] not in acute distress.    Appearance: Normal appearance.  HENT:     Mouth/Throat:     Mouth: Mucous membranes are moist.     Pharynx: Oropharynx is clear.  Cardiovascular:     Rate and Rhythm: Normal rate.  Pulmonary:     Effort: Pulmonary effort is normal. No respiratory distress.  Skin:    General: Skin is warm and dry.  Neurological:     Mental Status: [name] alert.     Comments: Alert, awake, and oriented x3. Follows all commands. Cognitively intact. Hesitant with speech but expressive aphasia continues  to improve. PERRL bilaterally. EOMs intact bilaterally without nystagmus or subjective diplopia. Mild right facial droop stable Tongue midline. Hand grip 5/5 bilaterally, lower extremity strength 5/5 bilaterally. Facial and lower extremity sensation intact and equal bilaterally Decreased sensation of right forearm (posterior/meidal forearm/elbow) compared to left forearm which is constant and unchanged from prior assessment. Sensation intact and equal in lower extremities.  No pronator drift.    Imaging: EEG  Result Date: 08/16/2019 [name], MD     08/16/2019 11:56 AM Patient Name: Victor Pena MRN: [id number] Epilepsy Attending: [name] Referring Physician/Provider: Dr. 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  EEG evaluate for seizures. Level of alertness: Awake, asleep AEDs during EEG study: None Technical aspects: This EEG study was done with scalp electrodes positioned according to the 10-20 International system of electrode placement. Electrical activity was acquired at a sampling rate of 500Hz  and reviewed with a high frequency filter of 70Hz  and a low frequency filter of 1Hz . EEG data were recorded continuously and digitally stored. Description: The posterior dominant rhythm consists of 9-10 Hz activity of  moderate voltage (25-35 uV) seen predominantly in posterior head regions, symmetric and reactive to eye opening and eye closing.  Sleep was characterized by vertex waves, sleep spindles (12 to 14 Hz), maximal frontocentral region.  Hyperventilation and photic stimulation were not performed.   IMPRESSION: This study is within normal limits. No seizures or epileptiform discharges were seen throughout the recording. [name]   VAS [place]  TRANSCRANIAL DOPPLER  Result Date: 08/16/2019  Transcranial Doppler Indications: Subarachnoid hemorrhage. Comparison Study: 08/14/19 previous Performing Technologist: Blanch Media RVS  Examination Guidelines: A complete evaluation includes B-mode imaging, spectral Doppler, color Doppler, and power Doppler as needed of all accessible portions of each vessel. Bilateral testing is considered an integral part of a complete examination. Limited examinations for reoccurring indications may be performed as noted.  +----------+-------------+----------+-----------+-------+ RIGHT TCD Right VM (cm)Depth (cm)PulsatilityComment +----------+-------------+----------+-----------+-------+ MCA           71.00                 1.12            +----------+-------------+----------+-----------+-------+ ACA          -38.00                 1.35            +----------+-------------+----------+-----------+-------+ Term ICA      25.00                 1.16            +----------+-------------+----------+-----------+-------+ PCA           20.00                 1.43            +----------+-------------+----------+-----------+-------+ Opthalmic     23.00                 1.67            +----------+-------------+----------+-----------+-------+ ICA siphon    42.00                 1.42            +----------+-------------+----------+-----------+-------+ Vertebral    -14.00                 1.05            +----------+-------------+----------+-----------+-------+  +----------+------------+----------+-----------+-------+ LEFT TCD  Left VM (cm)Depth (cm)PulsatilityComment +----------+------------+----------+-----------+-------+ MCA          63.00                 1.14            +----------+------------+----------+-----------+-------+ ACA          -46.00                1.00            +----------+------------+----------+-----------+-------+ Term ICA     43.00                 1.03             +----------+------------+----------+-----------+-------+ PCA          39.00                 1.36            +----------+------------+----------+-----------+-------+ Opthalmic    27.00                 1.39            +----------+------------+----------+-----------+-------+  ICA siphon   43.00                 1.40            +----------+------------+----------+-----------+-------+ Vertebral    -18.00                1.33            +----------+------------+----------+-----------+-------+  +------------+-------+-------+             VM cm/sComment +------------+-------+-------+ Prox Basilar-19.00         +------------+-------+-------+ Summary: This was a normal transcranial Doppler study, with normal flow direction and velocity of all identified vessels of the anterior and posterior circulations, with no evidence of stenosis, vasospasm or occlusion. There was no evidence of intracranial disease.  Mildly elevated pulsatility indices throughout of unclear significance *See table(s) above for TCD measurements and observations.  Diagnosing physician: Delia Heady MD Electronically signed by Delia Heady MD on 08/16/2019 at 5:08:01 PM.    Final    VAS [place] TRANSCRANIAL DOPPLER  Result Date: 08/14/2019  Transcranial Doppler Indications: Subarachnoid hemorrhage. Comparison Study: 08/12/2019 Performing Technologist: Chanda Busing RVT  Examination Guidelines: A complete evaluation includes B-mode imaging, spectral Doppler, color Doppler, and power Doppler as needed of all accessible portions of each vessel. Bilateral testing is considered an integral part of a complete examination. Limited examinations for reoccurring indications may be performed as noted.  +----------+-------------+----------+-----------+-------+ RIGHT TCD Right VM (cm)Depth (cm)PulsatilityComment +----------+-------------+----------+-----------+-------+ MCA           66.00                 1.08             +----------+-------------+----------+-----------+-------+ ACA          -22.00                 1.02            +----------+-------------+----------+-----------+-------+ Term ICA      52.00                 1.36            +----------+-------------+----------+-----------+-------+ PCA           34.00                 0.86            +----------+-------------+----------+-----------+-------+ Opthalmic     17.00                 1.32            +----------+-------------+----------+-----------+-------+ ICA siphon    18.00                 1.62            +----------+-------------+----------+-----------+-------+ Vertebral    -19.00                 0.75            +----------+-------------+----------+-----------+-------+  +----------+------------+----------+-----------+-------+ LEFT TCD  Left VM (cm)Depth (cm)PulsatilityComment +----------+------------+----------+-----------+-------+ MCA          71.00                 1.11            +----------+------------+----------+-----------+-------+ ACA          -26.00                1.35            +----------+------------+----------+-----------+-------+  Term ICA     48.00                 1.12            +----------+------------+----------+-----------+-------+ PCA          22.00                 1.01            +----------+------------+----------+-----------+-------+ Opthalmic    21.00                 1.12            +----------+------------+----------+-----------+-------+ ICA siphon   25.00                 1.79            +----------+------------+----------+-----------+-------+ Vertebral    -24.00                1.04            +----------+------------+----------+-----------+-------+  +------------+-------+-------+             VM cm/sComment +------------+-------+-------+ Prox Basilar-29.00  0.93   +------------+-------+-------+ Dist Basilar-37.00  0.91   +------------+-------+-------+  Summary:  Normal mean flow velocities inm majority of identidied vessles of anterior and posterior cerebral circulations. No definite evidence of vasospasm noted. *See table(s) above for TCD measurements and observations.  Diagnosing physician: Delia HeadyPramod Sethi MD Electronically signed by Delia HeadyPramod Sethi MD on 08/14/2019 at 4:03:24 PM.    Final    CT ANGIO HEAD CODE STROKE  Result Date: 08/16/2019 CLINICAL DATA:  Speech difficulty. EXAM: CT ANGIOGRAPHY HEAD TECHNIQUE: Multidetector CT imaging of the head was performed using the standard protocol during bolus administration of intravenous contrast. Multiplanar CT image reconstructions and MIPs were obtained to evaluate the vascular anatomy. CONTRAST:  80 cc Omnipaque 350 intravenous COMPARISON:  Brain MRI from 9 days ago FINDINGS: CT HEAD Brain: Decrease in left perisylvian subarachnoid hemorrhage. 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 No hydrocephalus or extra-axial collection. Vascular: Left MCA bifurcation aneurysm coil mass Skull: Normal. Negative for fracture or focal lesion. Sinuses: Opacified right posterior ethmoid air cell. Orbits: Negative CTA HEAD Anterior circulation: Left MCA bifurcation aneurysm coil mass with associated streak artifact. No detected residual sac enhancement. The adjacent vessels are patent without visible luminal thrombus, but limited adjacent to the treatment site. No major downstream branch occlusion is seen. No embolic disease seen to the other anterior vessels. 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 Posterior circulation: Symmetric vertebral arteries. The vertebral and basilar arteries are smooth and widely patent. Fetal type right PCA. No branch occlusion, beading, or aneurysm. Venous sinuses: Unremarkable in the arterial phase Anatomic variants: As above These results were communicated to Dr Antionette Charpyd at [facility]7:[id number] amon 5/14/2021by  text page via the [facility]. IMPRESSION: 1. 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 2. Left MCA bifurcation aneurysm coiling with expected regional artifact. 3. Decreasing left perisylvian subarachnoid hemorrhage. Electronically Signed   By: Marnee SpringJonathon  Watts M.D.   On: 08/16/2019 07:32    Labs:  CBC: Recent Labs    08/08/19 0219 08/09/19 0916 08/10/19 0559 08/11/19 0628  WBC 7.7 7.4 7.2 7.5  HGB 11.9* 13.2 13.0 13.6  HCT 35.6* 40.0 38.4* 39.9  PLT 246 269 296 338  COAGS: Recent Labs    07/15/19 1344 08/06/19 1217 08/06/19 1244  INR 1.0 1.0 1.0  APTT 29 27 28     BMP: Recent Labs    08/08/19 0219 08/09/19 0916 08/10/19 0559 08/11/19 0628  NA 139 138 139 140  K 4.2 3.7 4.2 3.7  CL 107 106 105 108  CO2 25 22 25  21*  GLUCOSE 105* 104* 101* 99  BUN 15 15 20 15   CALCIUM 8.6* 9.0 9.2 9.2  CREATININE 0.86 0.77 0.96 0.73  GFRNONAA >60 >60 >60 >60  GFRAA >60 >60 >60 >60    LIVER FUNCTION TESTS: Recent Labs    08/06/19 1217  BILITOT 0.9  AST 49*  ALT 46*  ALKPHOS 60  PROT 7.4  ALBUMIN 4.0    Assessment and Plan:  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 . Assessed at bedside alongside Dr. 10/06/19.  Continues to recover well after acute events yesterday. Physical exam overall unchanged. Plan remains in place for cerebral angiogram with stent placement Monday afternoon.    Continue q4 Neuro checks. No need for further TCDs.  Continue taking Brilinta 90 mg and aspirin 81 mg once daily.  Alcohol/tobacco abuse- on CIWA protocol, Nicotine patch in use. Appreciate PT/OT/speech evaluations- continue with SLP/OT outpatient per recommendations. Appreciate TRH assistance with patient.  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

## 2019-08-17 NOTE — Progress Notes (Signed)
STROKE TEAM PROGRESS NOTE   INTERVAL HISTORY  Pt lying in bed, speech much improved none a week ago I saw him.  More fluent and less hesitation.  No significant word finding difficulty.  He had 1 episode of worsening expressive aphasia yesterday early morning, CT, CTA, TCD unremarkable.  EEG no seizure.  Episode could be due to patient had no good sleep that night with encephalopathy.  Vitals:   08/16/19 1601 08/16/19 2015 08/17/19 0000 08/17/19 0400  BP: 130/78 128/81 127/80 130/84  Pulse: 73 76 66 72  Resp: 20 20 20 20   Temp: 97.8 F (36.6 C) 98 F (36.7 C) 98.1 F (36.7 C) 98.3 F (36.8 C)  TempSrc: Oral Oral Oral Oral  SpO2: 98% 99% 99% 98%  Weight:      Height:        CBC:  Recent Labs  Lab 08/10/19 0559 08/11/19 0628  WBC 7.2 7.5  HGB 13.0 13.6  HCT 38.4* 39.9  MCV 104.6* 103.6*  PLT 296 338    Basic Metabolic Panel:  Recent Labs  Lab 08/10/19 0559 08/11/19 0628  NA 139 140  K 4.2 3.7  CL 105 108  CO2 25 21*  GLUCOSE 101* 99  BUN 20 15  CREATININE 0.96 0.73  CALCIUM 9.2 9.2    IMAGING past 24 hours EEG  Result Date: 08/16/2019 08/18/2019, MD     08/16/2019 11:56 AM Patient Name: Victor Pena MRN: [name] Attending: [id number] Referring Physician/Provider: Dr. Charlsie Quest Date: 08/16/2019 Duration: 24.28 minutes Patient history: 64 year old male with subarachnoid hemorrhage secondary to left MCA aneurysm status post coiling with mild aphasia at baseline and abrupt onset of garbled speech which improved after some time in morning.  EEG evaluate for seizures. Level of alertness: Awake, asleep AEDs during EEG study: None Technical aspects: This EEG study was done with scalp electrodes positioned according to the 10-20 International system of electrode placement. Electrical activity was acquired at a sampling rate of 500Hz  and reviewed with a high frequency filter of 70Hz  and a low frequency filter of 1Hz . EEG data were recorded  continuously and digitally stored. Description: The posterior dominant rhythm consists of 9-10 Hz activity of moderate voltage (25-35 uV) seen predominantly in posterior head regions, symmetric and reactive to eye opening and eye closing.  Sleep was characterized by vertex waves, sleep spindles (12 to 14 Hz), maximal frontocentral region.  Hyperventilation and photic stimulation were not performed.   IMPRESSION: This study is within normal limits. No seizures or epileptiform discharges were seen throughout the recording. [name]   VAS 77 TRANSCRANIAL DOPPLER  Result Date: 08/16/2019  Transcranial Doppler Indications: Subarachnoid hemorrhage. Comparison Study: 08/14/19 previous Performing Technologist: RVS  Examination Guidelines: A complete evaluation includes B-mode imaging, spectral Doppler, color Doppler, and power Doppler as needed of all accessible portions of each vessel. Bilateral testing is considered an integral part of a complete examination. Limited examinations for reoccurring indications may be performed as noted.  +----------+-------------+----------+-----------+-------+ RIGHT TCD Right VM (cm)Depth (cm)PulsatilityComment +----------+-------------+----------+-----------+-------+ MCA           71.00                 1.12            +----------+-------------+----------+-----------+-------+ ACA          -38.00                 1.35            +----------+-------------+----------+-----------+-------+  Term ICA      25.00                 1.16            +----------+-------------+----------+-----------+-------+ PCA           20.00                 1.43            +----------+-------------+----------+-----------+-------+ Opthalmic     23.00                 1.67            +----------+-------------+----------+-----------+-------+ ICA siphon    42.00                 1.42            +----------+-------------+----------+-----------+-------+ Vertebral     -14.00                 1.05            +----------+-------------+----------+-----------+-------+  +----------+------------+----------+-----------+-------+ LEFT TCD  Left VM (cm)Depth (cm)PulsatilityComment +----------+------------+----------+-----------+-------+ MCA          63.00                 1.14            +----------+------------+----------+-----------+-------+ ACA          -46.00                1.00            +----------+------------+----------+-----------+-------+ Term ICA     43.00                 1.03            +----------+------------+----------+-----------+-------+ PCA          39.00                 1.36            +----------+------------+----------+-----------+-------+ Opthalmic    27.00                 1.39            +----------+------------+----------+-----------+-------+ ICA siphon   43.00                 1.40            +----------+------------+----------+-----------+-------+ Vertebral    -18.00                1.33            +----------+------------+----------+-----------+-------+  +------------+-------+-------+             VM cm/sComment +------------+-------+-------+ Prox Basilar-19.00         +------------+-------+-------+ Summary: This was a normal transcranial Doppler study, with normal flow direction and velocity of all identified vessels of the anterior and posterior circulations, with no evidence of stenosis, vasospasm or occlusion. There was no evidence of intracranial disease.  Mildly elevated pulsatility indices throughout of unclear significance *See table(s) above for TCD measurements and observations.  Diagnosing physician: Delia Heady MD Electronically signed by Delia Heady MD on 08/16/2019 at 5:08:01 PM.    Final    CT ANGIO HEAD CODE STROKE  Result Date: 08/16/2019 CLINICAL DATA:  Speech difficulty. EXAM: CT ANGIOGRAPHY HEAD TECHNIQUE: Multidetector CT imaging of the head was performed using the standard  protocol during bolus administration of intravenous contrast. Multiplanar CT image reconstructions and  MIPs were obtained to evaluate the vascular anatomy. CONTRAST:  80 cc Omnipaque 350 intravenous COMPARISON:  Brain MRI from 9 days ago FINDINGS: CT HEAD Brain: Decrease in left perisylvian subarachnoid hemorrhage. 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 No hydrocephalus or extra-axial collection. Vascular: Left MCA bifurcation aneurysm coil mass Skull: Normal. Negative for fracture or focal lesion. Sinuses: Opacified right posterior ethmoid air cell. Orbits: Negative CTA HEAD Anterior circulation: Left MCA bifurcation aneurysm coil mass with associated streak artifact. No detected residual sac enhancement. The adjacent vessels are patent without visible luminal thrombus, but limited adjacent to the treatment site. No major downstream branch occlusion is seen. No embolic disease seen to the other anterior vessels. 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 Posterior circulation: Symmetric vertebral arteries. The vertebral and basilar arteries are smooth and widely patent. Fetal type right PCA. No branch occlusion, beading, or aneurysm. Venous sinuses: Unremarkable in the arterial phase Anatomic variants: As above These results were communicated to Dr Myna Hidalgo at [facility] 5/14/2021by text page via the [facility] messaging system. IMPRESSION: 1. 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 2. Left MCA bifurcation aneurysm coiling with expected regional artifact. 3. Decreasing left perisylvian subarachnoid hemorrhage. Electronically Signed   By: Monte Fantasia M.D.   On: 08/16/2019 07:32     PHYSICAL EXAM     General -pleasant middle-age Caucasian male, in no apparent distress.  Ophthalmologic - fundi not visualized due to  noncooperation.  Cardiovascular - Regular rhythm and rate.  Mental Status -  Level of arousal and orientation to time, place, and person were intact. Language exam showed much improved speech fluency, no significant hesitation, able to name and slow repetition. Comprehension intact. Attention span and concentration were normal. Fund of Knowledge was assessed and was intact.  Cranial Nerves II - XII - II - Visual field intact OU. III, IV, VI - Extraocular movements intact. V - Facial sensation intact bilaterally. VII -.  No facial droop noted today.   VIII - Hearing & vestibular intact bilaterally. X - Palate elevates symmetrically. XI - Chin turning & shoulder shrug intact bilaterally. XII - Tongue protrusion intact.  Motor Strength - The patient's strength was normal in all extremities and pronator drift was absent.  Bulk was normal and fasciculations were absent.   Motor Tone - Muscle tone was assessed at the neck and appendages and was normal.  Diminished fine finger movements on the right compared to the left. Reflexes - The patient's reflexes were symmetrical in all extremities and he had no pathological reflexes.  Sensory - Light touch, temperature/pinprick were assessed and were symmetrical.    Coordination - The patient had normal movements in the hands with no ataxia or dysmetria.  Tremor was absent.  Gait and Station - deferred.   ASSESSMENT/PLAN [name] is a 64 y.o. 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 He was taking aspirin and brilinta as of 4/28 and scheduled for an endovascular procedure to treat his RIGHT sided aneurysm 5/5, however, on 5/4 he presented with intermittent dysphagia, R hand numbness and L occipital HA. CT showed L frontoparietal SAH. He was taken to IR for aneurysm securement -> s/p balloon assisted coiling.  The patient is being kept in the ICU  to monitor  for post hemorrhage vasospasms.  [name] on amlodipine.  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   Cerebral angio 4/16 - blister like aneurysm right ICA paraophthalmic segment 5.6mm. and left MCA bifurcation saccular aneurysm wide neck 4.71mm  Cerebral angio 5/4 - 4.78mm wide neck L MCA bifurcation aneurysm s/p balloon assisted coiling.   MRI Scattered SAH L sylvian fissure and L frontoparietal convexity stable. Probable few punctate L frontal and parietal lobe infarcts, other DWI abnormality d/t SAH. L MCA coil.  CTA head 5/14 small cortical posterior L temporal lobe and low parietal cortex infarcts new from MRI. L MCA coiling. Decreasing L perisylvian SAH.    Plan for repeat cerebral angiogram next Monday  2D Echo EF 60-65%. No source of embolus   TCD MWF - have been normal.   EEG x 2 no sz;  LDL 149  HgbA1c 5.2   On nimodipine 60 mg q4h x 21 days  SCDs for VTE prophylaxis  aspirin 81 mg daily and Brilinta 90 daily prior to admission, now on aspirin and Brilinta.  Therapy recommendations:  OP OT, OP SLP  Disposition:  pending  Blood Pressure  Home meds:  None, no hx HTN  Off Cleviprex  Labetalol PRN . SBP goal < 160  . Long-term BP goal normotensive  Hyperlipidemia  Home meds:  No statin  LDL 149  On lipitor 40  Continue on discharge  Alcohol use  ETOH use, alcohol intake elevated lately  Advised to drink no more than 2 drink(s) a day.   Placed on CIWA protocol  (prn ativan)  FA/B1/MVI  Seizure precaution  Tobacco abuse  Current smoker  Smoking cessation counseling provided  Nicotine patch provided   Pt is willing to quit  Other Stroke Risk Factors  Family hx aneurysm (father)   Other Active Problems  Code status - Full code  Conductive hearing loss in left ear  Hospital day # 11  Neurology will follow in distance.  Please call with questions. Pt will follow up with stroke clinic Dr. Pearlean Brownie at [facility] in about 4 weeks. Thanks for the consult.   Marvel Plan, MD PhD Stroke Neurology 08/17/2019 4:32 PM    To contact Stroke Continuity provider, please refer to [email or web address]. After hours, contact General Neurology

## 2019-08-17 NOTE — Progress Notes (Signed)
PROGRESS NOTE    Victor Pena  IOE:[id number] DOB: Dec 01, 1955 DOA: 08/06/2019 PCP: Patient, No Pcp Per    Brief Narrative:  Victor Pena is a 64 y.o. male with medical history significant of intracranial aneurysm who initially presented with complaints of difficulty speaking with right hand weakness and numbness.  CT scan of the brain revealed a subarachnoid bleed or patient was noted to have a left MCA aneurysm.  IR was consulted and performed cerebral arteriogram with emergent balloon assisted coiling on 5/4 by Dr. Sherlon Handing.  Since that time patient expressive aphasia has continued to improve daily.  Patient is to remain in hospital for total of 14 days to monitor for vasospasm with daily transcranial Dopplers.   Assessment & Plan:   Active Problems:   Subarachnoid hemorrhage (HCC)   Brain aneurysm   Difficulty with speech  Aneurysmal subarachnoid hemorrhagestatus post coiling on 5/4  Patient presenting with dysphagia was found to have a subarachnoid hemorrhage with a ruptured left MCA bifurcation patient aneurysm, patient underwent cooling by interventional radiology on 08/06/2019.  CT angiogram head 5/14 notable for decreased size of perisylvian subarachnoid hemorrhage. --Continue close monitoring by IR/neurology for spasm; Day #11 of 14 --Continue aspirin, Lipitor, amlodipine --Nimodipine 60mg  PO q4h; plan 21-day course --Repeat CTA head plan for 5/17  Acute left temporal/parietal CVA Patient this morning with slurred speech, had CT angiogram head with small cortical based infarct clustered in the posterior left temporal and low parietal cortex which is new since brain MR 9 days prior; also notable decreased in perisylvian SAH. EEG within normal limits, no seizures or epileptiform discharges noted.  Neurology believes this could be related to the amount of exposed coil in his wide-neck aneurysm. --Restarted Brilinta 90 mg p.o. twice daily 5/14 --Interventional neurology recommends  repeating CT angiogram head on Monday; will be n.p.o. Sunday evening for possible need of angioplasty/stent placement  Hyperlipidemia LDL 149, on Lipitor 40 mg  Metabolic acidosis Likely from dehydration.  Alcohol abuse No signs of withdrawal symptoms.  Counseled on need for cessation. --continue thiamine, folic acid, multivitamin  Tobacco abuse Counseled on need for cessation. --Nicotine patch  B12 deficiency B12 132, low --sarted B12 Wednesday PO daily  DVT prophylaxis: SCDs Code Status: Full code Family Communication: No family present at bedside  Disposition Plan:  Status is: Inpatient  Remains inpatient appropriate because:Ongoing diagnostic testing needed not appropriate for outpatient work up and Inpatient level of care appropriate due to severity of illness patient needs 14 days of close monitoring following subarachnoid hemorrhage status post coiling for frequent transcranial Dopplers monitoring for vasospasm   Dispo: The patient is from: Home              Anticipated d/c is to: Home              Anticipated d/c date is: 3 days              Patient currently is not medically stable to d/c.    Consultants:   Interventional radiology  Neurology  Procedures:   Left frontoparietal SAH secondary to left MCA aneurysm rupture status post emergent coiling 5/4  Antimicrobials:   None   Subjective: Patient seen and examined bedside, resting comfortably.  Patient reports better sleep overnight.  Less anxiousness.  Speech, dysphagia appear to be back to his baseline.  Continues with some mild paresthesias to his right posterior forearm region.  Interventional neurology planning repeat CT angiogram on Monday with a possible angioplasty/stent placement  if needed.  Patient with no other specific complaints at this time. Denies headache, no visual changes, no chest pain, no palpitations, no shortness of breath, no nausea/vomiting/diarrhea, no fever/chills/night sweats,  no weakness.  No other acute events overnight per nursing staff.  Objective: Vitals:   08/16/19 2015 08/17/19 0000 08/17/19 0400 08/17/19 0725  BP: 128/81 127/80 130/84 111/78  Pulse: 76 66 72 62  Resp: 20 20 20 18   Temp: 98 F (36.7 C) 98.1 F (36.7 C) 98.3 F (36.8 C) 98.5 F (36.9 C)  TempSrc: Oral Oral Oral Oral  SpO2: 99% 99% 98% 97%  Weight:      Height:       No intake or output data in the 24 hours ending 08/17/19 1050 Filed Weights   08/06/19 1213  Weight: 86.2 kg    Examination:  General exam: Appears calm and comfortable  Respiratory system: Clear to auscultation. Respiratory effort normal. Cardiovascular system: S1 & S2 heard, RRR. No JVD, murmurs, rubs, gallops or clicks. No pedal edema. Gastrointestinal system: Abdomen is nondistended, soft and nontender. No organomegaly or masses felt. Normal bowel sounds heard. Central nervous system: Alert and oriented. No focal neurological deficits. Extremities: Symmetric 5 x 5 power. Skin: No rashes, lesions or ulcers Psychiatry: Judgement and insight appear normal. Mood & affect appropriate.     Data Reviewed: I have personally reviewed following labs and imaging studies  CBC: Recent Labs  Lab 08/11/19 0628  WBC 7.5  HGB 13.6  HCT 39.9  MCV 103.6*  PLT 338   Basic Metabolic Panel: Recent Labs  Lab 08/11/19 0628  NA 140  K 3.7  CL 108  CO2 21*  GLUCOSE 99  BUN 15  CREATININE 0.73  CALCIUM 9.2   GFR: Estimated Creatinine Clearance: 99.4 mL/min (by C-G formula based on SCr of 0.73 mg/dL). Liver Function Tests: No results for input(s): AST, ALT, ALKPHOS, BILITOT, PROT, ALBUMIN in the last 168 hours. No results for input(s): LIPASE, AMYLASE in the last 168 hours. No results for input(s): AMMONIA in the last 168 hours. Coagulation Profile: No results for input(s): INR, PROTIME in the last 168 hours. Cardiac Enzymes: No results for input(s): CKTOTAL, CKMB, CKMBINDEX, TROPONINI in the last 168  hours. BNP (last 3 results) No results for input(s): PROBNP in the last 8760 hours. HbA1C: No results for input(s): HGBA1C in the last 72 hours. CBG: Recent Labs  Lab 08/16/19 0549  GLUCAP 99   Lipid Profile: No results for input(s): CHOL, HDL, LDLCALC, TRIG, CHOLHDL, LDLDIRECT in the last 72 hours. Thyroid Function Tests: Recent Labs    08/15/19 0417  TSH 3.652   Anemia Panel: Recent Labs    08/15/19 0417  VITAMINB12 132*   Sepsis Labs: No results for input(s): PROCALCITON, LATICACIDVEN in the last 168 hours.  No results found for this or any previous visit (from the past 240 hour(s)).       Radiology Studies: EEG  Result Date: 08/16/2019 08/18/2019, MD     08/16/2019 11:56 AM Patient Name: DIONDRE PULIS MRN: [name] Attending: [id number] Referring Physician/Provider: Dr. Charlsie Quest Date: 08/16/2019 Duration: 24.28 minutes Patient history: 64 year old male with subarachnoid hemorrhage secondary to left MCA aneurysm status post coiling with mild aphasia at baseline and abrupt onset of garbled speech which improved after some time in morning.  EEG evaluate for seizures. Level of alertness: Awake, asleep AEDs during EEG study: None Technical aspects: This EEG study was done with scalp electrodes positioned  according to the 10-20 International system of electrode placement. Electrical activity was acquired at a sampling rate of 500Hz  and reviewed with a high frequency filter of 70Hz  and a low frequency filter of 1Hz . EEG data were recorded continuously and digitally stored. Description: The posterior dominant rhythm consists of 9-10 Hz activity of moderate voltage (25-35 uV) seen predominantly in posterior head regions, symmetric and reactive to eye opening and eye closing.  Sleep was characterized by vertex waves, sleep spindles (12 to 14 Hz), maximal frontocentral region.  Hyperventilation and photic stimulation were not performed.   IMPRESSION: This study  is within normal limits. No seizures or epileptiform discharges were seen throughout the recording. [name]   VAS TRANSCRANIAL DOPPLER  Result Date: 08/16/2019  Transcranial Doppler Indications: Subarachnoid hemorrhage. Comparison Study: 08/14/19 previous Performing Technologist: [place] [employer]S  Examination Guidelines: A complete evaluation includes B-mode imaging, spectral Doppler, color Doppler, and power Doppler as needed of all accessible portions of each vessel. Bilateral testing is considered an integral part of a complete examination. Limited examinations for reoccurring indications may be performed as noted.  +----------+-------------+----------+-----------+-------+  RIGHT TCD  Right VM (cm) Depth (cm) Pulsatility Comment  +----------+-------------+----------+-----------+-------+  MCA            71.00                   1.12              +----------+-------------+----------+-----------+-------+  ACA           -38.00                   1.35              +----------+-------------+----------+-----------+-------+  Term ICA       25.00                   1.16              +----------+-------------+----------+-----------+-------+  PCA            20.00                   1.43              +----------+-------------+----------+-----------+-------+  Opthalmic      23.00                   1.67              +----------+-------------+----------+-----------+-------+  ICA siphon     42.00                   1.42              +----------+-------------+----------+-----------+-------+  Vertebral     -14.00                   1.05              +----------+-------------+----------+-----------+-------+  +----------+------------+----------+-----------+-------+  LEFT TCD   Left VM (cm) Depth (cm) Pulsatility Comment  +----------+------------+----------+-----------+-------+  MCA           63.00                   1.14              +----------+------------+----------+-----------+-------+  ACA           -46.00  1.00              +----------+------------+----------+-----------+-------+  Term ICA      43.00                   1.03              +----------+------------+----------+-----------+-------+  PCA           39.00                   1.36              +----------+------------+----------+-----------+-------+  Opthalmic     27.00                   1.39              +----------+------------+----------+-----------+-------+  ICA siphon    43.00                   1.40              +----------+------------+----------+-----------+-------+  Vertebral     -18.00                  1.33              +----------+------------+----------+-----------+-------+  +------------+-------+-------+               VM cm/s Comment  +------------+-------+-------+  Prox Basilar -19.00           +------------+-------+-------+ Summary: This was a normal transcranial Doppler study, with normal flow direction and velocity of all identified vessels of the anterior and posterior circulations, with no evidence of stenosis, vasospasm or occlusion. There was no evidence of intracranial disease.  Mildly elevated pulsatility indices throughout of unclear significance *See table(s) above for TCD measurements and observations.  Diagnosing physician: Antony Contras MD Electronically signed by Antony Contras MD on 08/16/2019 at 5:08:01 PM.    Final    CT ANGIO HEAD CODE STROKE  Result Date: 08/16/2019 CLINICAL DATA:  Speech difficulty. EXAM: CT ANGIOGRAPHY HEAD TECHNIQUE: Multidetector CT imaging of the head was performed using the standard protocol during bolus administration of intravenous contrast. Multiplanar CT image reconstructions and MIPs were obtained to evaluate the vascular anatomy. CONTRAST:  80 cc Omnipaque 350 intravenous COMPARISON:  Brain MRI from 9 days ago FINDINGS: CT HEAD Brain: Decrease in left perisylvian subarachnoid hemorrhage. Small cortically based infarcts have occurred along the posterior left temporal and low parietal convexity, new  since comparison brain MRI. No hydrocephalus or extra-axial collection. Vascular: Left MCA bifurcation aneurysm coil mass Skull: Normal. Negative for fracture or focal lesion. Sinuses: Opacified right posterior ethmoid air cell. Orbits: Negative CTA HEAD Anterior circulation: Left MCA bifurcation aneurysm coil mass with associated streak artifact. No detected residual sac enhancement. The adjacent vessels are patent without visible luminal thrombus, but limited adjacent to the treatment site. No major downstream branch occlusion is seen. No embolic disease seen to the other anterior vessels. 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 Posterior circulation: Symmetric vertebral arteries. The vertebral and basilar arteries are smooth and widely patent. Fetal type right PCA. No branch occlusion, beading, or aneurysm. Venous sinuses: Unremarkable in the arterial phase Anatomic variants: As above These results were communicated to Dr Myna Hidalgo at [facility] 5/14/2021by text page via the [facility] messaging system. IMPRESSION: 1. 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 2.  Left MCA bifurcation aneurysm coiling with expected regional artifact. 3. Decreasing left perisylvian subarachnoid hemorrhage. Electronically Signed   By: Marnee SpringJonathon  Watts M.D.   On: 08/16/2019 07:32        Scheduled Meds:  aspirin  81 mg Oral Daily   atorvastatin  40 mg Oral Daily   folic acid  1 mg Oral Daily   multivitamin with minerals  1 tablet Oral Daily   nicotine  14 mg Transdermal Daily   niMODipine  60 mg Oral Q4H   pantoprazole  40 mg Oral Daily   pneumococcal 23 valent vaccine  0.5 mL Intramuscular Tomorrow-1000   senna-docusate  1 tablet Oral BID   thiamine  100 mg Oral Daily   Or   thiamine  100 mg Intravenous Daily   ticagrelor  90 mg Oral BID   vitamin B-12  1,000 mcg Oral Daily   Continuous Infusions:    LOS: 11  days    Time spent: 35 minutes spent on chart review, discussion with nursing staff, consultants, updating family and interview/physical exam; more than 50% of that time was spent in counseling and/or coordination of care.    Alvira PhilipsEric J [place], DO [facility] Available via Epic secure chat 7am-7pm After these hours, please refer to coverage provider listed on [email or web address] 08/17/2019, 10:50 AM

## 2019-08-18 LAB — FOLATE RBC
Folate, Hemolysate: 307 ng/mL
Folate, RBC: 837 ng/mL (ref >498)
Hematocrit: 36.7 % — ABNORMAL LOW (ref 37.5–51.0)

## 2019-08-18 LAB — PLATELET INHIBITION P2Y12: Platelet Function  P2Y12: 1 [PRU] — ABNORMAL LOW (ref 182–335)

## 2019-08-18 NOTE — Progress Notes (Signed)
Referring Physician(s):  Pena,Victor R  Supervising Physician: Victor Pena  Patient Status:  [name] - In-pt  Chief Complaint:  "Right arm numbness"  Subjective: Assessed at bedside this AM. Anticipating procedure tomorrow.  Understands he will likely transfer back to ICU overnight after procedure tomorrow.  Nose bleed yesterday-- "swipe of blood on tissue" quickly resolved.  Allergies: Other  Medications: Prior to Admission medications   Medication Sig Start Date End Date Taking? Authorizing Provider  acetaminophen (TYLENOL) 500 MG tablet Take 1,000 mg by mouth every 6 (six) hours as needed for mild pain or headache.   Yes [provider]  aspirin 81 MG EC tablet Take 81 mg by mouth daily. Swallow whole.   Yes [provider]  ibuprofen (ADVIL) 200 MG tablet Take 400 mg by mouth every 6 (six) hours as needed for moderate pain.   Yes [provider]  ticagrelor (BRILINTA) 90 MG TABS tablet Take 90 mg by mouth daily.    Yes [provider]  diazepam (VALIUM) 5 MG tablet Take 1 tablet 30 mins prior to MRI. May take second dose if needed. Patient not taking: Reported on 07/17/2019 07/02/19   Victor Sprang, MD     Vital Signs: BP 110/84 (BP Location: Right Arm)   Pulse 72   Temp (!) 97.5 F (36.4 C) (Oral)   Resp 20   Ht 5\' 11"  (1.803 m)   Wt 190 lb (86.2 kg)   SpO2 99%   BMI 26.50 kg/m   Physical Exam Vitals and nursing note reviewed.  Constitutional:      General: [name] not in acute distress.    Appearance: Normal appearance.  HENT:     Mouth/Throat:     Mouth: Mucous membranes are moist.     Pharynx: Oropharynx is clear.  Cardiovascular:     Rate and Rhythm: Normal rate.  Pulmonary:     Effort: Pulmonary effort is normal. No respiratory distress.  Skin:    General: Skin is warm and dry.  Neurological:     Mental Status: [name] alert.     Comments: Alert, awake, and oriented x3. Follows all commands.  Cognitively intact. Hesitant with speech, but finds all words. EOMs intact bilaterally without nystagmus or subjective diplopia. Mild right facial droop stable Hand grip 5/5 bilaterally, lower extremity strength 5/5 bilaterally. Facial and lower extremity sensation intact and equal bilaterally Decreased sensation of right forearm Sensation intact and equal in lower extremities.    Imaging: EEG  Result Date: 08/16/2019 Victor Havens, MD     08/16/2019 11:56 AM Patient Name: Victor Pena MRN: [id number] Epilepsy Attending: Lora Pena Referring Physician/Provider: Dr. 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  EEG evaluate for seizures. Level of alertness: Awake, asleep AEDs during EEG study: None Technical aspects: This EEG study was done with scalp electrodes positioned according to the 10-20 International system of electrode placement. Electrical activity was acquired at a sampling rate of 500Hz  and reviewed with a high frequency filter of 70Hz  and a low frequency filter of 1Hz . EEG data were recorded continuously and digitally stored. Description: The posterior dominant rhythm consists of 9-10 Hz activity of moderate voltage (25-35 uV) seen predominantly in posterior head regions, symmetric and reactive to eye opening and eye  closing.  Sleep was characterized by vertex waves, sleep spindles (12 to 14 Hz), maximal frontocentral region.  Hyperventilation and photic stimulation were not performed.   IMPRESSION: This study is within normal limits. No seizures or epileptiform discharges were seen throughout the recording. Victor Pena   VAS [place] TRANSCRANIAL DOPPLER  Result Date: 08/16/2019  Transcranial Doppler Indications: Subarachnoid hemorrhage. Comparison  Study: 08/14/19 previous Performing Technologist: Victor Pena RVS  Examination Guidelines: A complete evaluation includes B-mode imaging, spectral Doppler, color Doppler, and power Doppler as needed of all accessible portions of each vessel. Bilateral testing is considered an integral part of a complete examination. Limited examinations for reoccurring indications may be performed as noted.  +----------+-------------+----------+-----------+-------+ RIGHT TCD Right VM (cm)Depth (cm)PulsatilityComment +----------+-------------+----------+-----------+-------+ MCA           71.00                 1.12            +----------+-------------+----------+-----------+-------+ ACA          -38.00                 1.35            +----------+-------------+----------+-----------+-------+ Term ICA      25.00                 1.16            +----------+-------------+----------+-----------+-------+ PCA           20.00                 1.43            +----------+-------------+----------+-----------+-------+ Opthalmic     23.00                 1.67            +----------+-------------+----------+-----------+-------+ ICA siphon    42.00                 1.42            +----------+-------------+----------+-----------+-------+ Vertebral    -14.00                 1.05            +----------+-------------+----------+-----------+-------+  +----------+------------+----------+-----------+-------+ LEFT TCD  Left VM (cm)Depth (cm)PulsatilityComment +----------+------------+----------+-----------+-------+ MCA          63.00                 1.14            +----------+------------+----------+-----------+-------+ ACA          -46.00                1.00            +----------+------------+----------+-----------+-------+ Term ICA     43.00                 1.03            +----------+------------+----------+-----------+-------+ PCA          39.00                 1.36             +----------+------------+----------+-----------+-------+ Opthalmic    27.00                 1.39            +----------+------------+----------+-----------+-------+ ICA siphon   43.00  1.40            +----------+------------+----------+-----------+-------+ Vertebral    -18.00                1.33            +----------+------------+----------+-----------+-------+  +------------+-------+-------+             VM cm/sComment +------------+-------+-------+ Prox Basilar-19.00         +------------+-------+-------+ Summary: This was a normal transcranial Doppler study, with normal flow direction and velocity of all identified vessels of the anterior and posterior circulations, with no evidence of stenosis, vasospasm or occlusion. There was no evidence of intracranial disease.  Mildly elevated pulsatility indices throughout of unclear significance *See table(s) above for TCD measurements and observations.  Diagnosing physician: Delia Heady MD Electronically signed by Delia Heady MD on 08/16/2019 at 5:08:01 PM.    Final    VAS [place] TRANSCRANIAL DOPPLER  Result Date: 08/14/2019  Transcranial Doppler Indications: Subarachnoid hemorrhage. Comparison Study: 08/12/2019 Performing Technologist: Chanda Busing RVT  Examination Guidelines: A complete evaluation includes B-mode imaging, spectral Doppler, color Doppler, and power Doppler as needed of all accessible portions of each vessel. Bilateral testing is considered an integral part of a complete examination. Limited examinations for reoccurring indications may be performed as noted.  +----------+-------------+----------+-----------+-------+ RIGHT TCD Right VM (cm)Depth (cm)PulsatilityComment +----------+-------------+----------+-----------+-------+ MCA           66.00                 1.08            +----------+-------------+----------+-----------+-------+ ACA          -22.00                 1.02             +----------+-------------+----------+-----------+-------+ Term ICA      52.00                 1.36            +----------+-------------+----------+-----------+-------+ PCA           34.00                 0.86            +----------+-------------+----------+-----------+-------+ Opthalmic     17.00                 1.32            +----------+-------------+----------+-----------+-------+ ICA siphon    18.00                 1.62            +----------+-------------+----------+-----------+-------+ Vertebral    -19.00                 0.75            +----------+-------------+----------+-----------+-------+  +----------+------------+----------+-----------+-------+ LEFT TCD  Left VM (cm)Depth (cm)PulsatilityComment +----------+------------+----------+-----------+-------+ MCA          71.00                 1.11            +----------+------------+----------+-----------+-------+ ACA          -26.00                1.35            +----------+------------+----------+-----------+-------+ Term ICA     48.00  1.12            +----------+------------+----------+-----------+-------+ PCA          22.00                 1.01            +----------+------------+----------+-----------+-------+ Opthalmic    21.00                 1.12            +----------+------------+----------+-----------+-------+ ICA siphon   25.00                 1.79            +----------+------------+----------+-----------+-------+ Vertebral    -24.00                1.04            +----------+------------+----------+-----------+-------+  +------------+-------+-------+             VM cm/sComment +------------+-------+-------+ Prox Basilar-29.00  0.93   +------------+-------+-------+ Dist Basilar-37.00  0.91   +------------+-------+-------+ Summary:  Normal mean flow velocities inm majority of identidied vessles of anterior and posterior cerebral circulations.  No definite evidence of vasospasm noted. *See table(s) above for TCD measurements and observations.  Diagnosing physician: Delia Heady MD Electronically signed by Delia Heady MD on 08/14/2019 at 4:03:24 PM.    Final    CT ANGIO HEAD CODE STROKE  Result Date: 08/16/2019 CLINICAL DATA:  Speech difficulty. EXAM: CT ANGIOGRAPHY HEAD TECHNIQUE: Multidetector CT imaging of the head was performed using the standard protocol during bolus administration of intravenous contrast. Multiplanar CT image reconstructions and MIPs were obtained to evaluate the vascular anatomy. CONTRAST:  80 cc Omnipaque 350 intravenous COMPARISON:  Brain MRI from 9 days ago FINDINGS: CT HEAD Brain: Decrease in left perisylvian subarachnoid hemorrhage. 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 No hydrocephalus or extra-axial collection. Vascular: Left MCA bifurcation aneurysm coil mass Skull: Normal. Negative for fracture or focal lesion. Sinuses: Opacified right posterior ethmoid air cell. Orbits: Negative CTA HEAD Anterior circulation: Left MCA bifurcation aneurysm coil mass with associated streak artifact. No detected residual sac enhancement. The adjacent vessels are patent without visible luminal thrombus, but limited adjacent to the treatment site. No major downstream branch occlusion is seen. No embolic disease seen to the other anterior vessels. 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 Posterior circulation: Symmetric vertebral arteries. The vertebral and basilar arteries are smooth and widely patent. Fetal type right PCA. No branch occlusion, beading, or aneurysm. Venous sinuses: Unremarkable in the arterial phase Anatomic variants: As above These results were communicated to Dr Antionette Char at [facility] 5/14/2021by text page via the [facility] messaging system. IMPRESSION: 1. Small cortically based infarcts clustered in the posterior left  temporal and low parietal cortex, new from brain MRI 9 days ago. 2. Left MCA bifurcation aneurysm coiling with expected regional artifact. 3. Decreasing left perisylvian subarachnoid hemorrhage. Electronically Signed   By: Marnee Spring M.D.   On: 08/16/2019 07:32    Labs:  CBC: Recent Labs    08/08/19 0219 08/09/19 0916 08/10/19 0559 08/11/19 0628  WBC 7.7 7.4 7.2 7.5  HGB 11.9* 13.2 13.0 13.6  HCT 35.6* 40.0 38.4* 39.9  PLT 246 269 296 338    COAGS: Recent Labs    07/15/19 1344 08/06/19 1217 08/06/19 1244  INR 1.0 1.0 1.0  APTT 29 27 28  BMP: Recent Labs    08/08/19 0219 08/09/19 0916 08/10/19 0559 08/11/19 0628  NA 139 138 139 140  K 4.2 3.7 4.2 3.7  CL 107 106 105 108  CO2 25 22 25  21*  GLUCOSE 105* 104* 101* 99  BUN 15 15 20 15   CALCIUM 8.6* 9.0 9.2 9.2  CREATININE 0.86 0.77 0.96 0.73  GFRNONAA >60 >60 >60 >60  GFRAA >60 >60 >60 >60    LIVER FUNCTION TESTS: Recent Labs    08/06/19 1217  BILITOT 0.9  AST 49*  ALT 46*  ALKPHOS 60  PROT 7.4  ALBUMIN 4.0    Assessment and Plan:  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 Tommie Samsde Macedo Rodrigues. Neuro exam stable.  Continues to recover well. States he has started sleeping better.  Plan remains in place for cerebral angiogram with stent placement Monday afternoon.  Per Dr. Tommie Samsde Macedo Rodrigues plan will be to proceed with anesthesia and complete angiogram while under anesthesia.  NPO p MN.   Continue q4 Neuro checks.  Continue taking Brilinta 90 mg and aspirin 81 mg once daily. Will discuss P2Y12 with Dr. Tommie Samsde Macedo Rodrigues.  Alcohol/tobacco abuse- on CIWA protocol, Nicotine patch in use. Appreciate PT/OT/speech evaluations- continue with SLP/OT as appropriate while inpatient. Note outpatient SLP/OT recommendations. Appreciate TRH assistance with  patient.  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

## 2019-08-18 NOTE — Progress Notes (Signed)
PROGRESS NOTE    Victor Pena  FGH:[id number] DOB: 1955-09-03 DOA: 08/06/2019 PCP: Patient, No Pcp Per    Brief Narrative:  Victor Pena is a 64 y.o. male with medical history significant of intracranial aneurysm who initially presented with complaints of difficulty speaking with right hand weakness and numbness.  CT scan of the brain revealed a subarachnoid bleed or patient was noted to have a left MCA aneurysm.  IR was consulted and performed cerebral arteriogram with emergent balloon assisted coiling on 5/4 by Dr. Sherlon Handing.  Since that time patient expressive aphasia has continued to improve daily.  Patient is to remain in hospital for total of 14 days to monitor for vasospasm with daily transcranial Dopplers.   Assessment & Plan:   Active Problems:   Subarachnoid hemorrhage (HCC)   Brain aneurysm   Difficulty with speech  Aneurysmal subarachnoid hemorrhagestatus post coiling on 5/4  Patient presenting with dysphagia was found to have a subarachnoid hemorrhage with a ruptured left MCA bifurcation patient aneurysm, patient underwent cooling by interventional radiology on 08/06/2019.  CT angiogram head 5/14 notable for decreased size of perisylvian subarachnoid hemorrhage. --Continue close monitoring by IR/neurology for spasm; Day #12 of 14 --Continue aspirin, Lipitor, amlodipine --Nimodipine 60mg  PO q4h; plan 21-day course --Repeat CT angiogram head planned for 5/17, n.p.o. after midnight  Acute left temporal/parietal CVA Patient this morning with slurred speech, had CT angiogram head with small cortical based infarct clustered in the posterior left temporal and low parietal cortex which is new since brain MR 9 days prior; also notable decreased in perisylvian SAH. EEG within normal limits, no seizures or epileptiform discharges noted.  Neurology believes this could be related to the amount of exposed coil in his wide-neck aneurysm. --Restarted Brilinta 90 mg p.o. twice daily  5/14 --Interventional neurology recommends repeating CT angiogram head on Monday; will be n.p.o. tonight at midnight for possible need of angioplasty/stent placement  Hyperlipidemia LDL 149, on Lipitor 40 mg  Metabolic acidosis Likely from dehydration.  Alcohol abuse No signs of withdrawal symptoms.  Counseled on need for cessation. --continue thiamine, folic acid, multivitamin  Tobacco abuse Counseled on need for cessation. --Nicotine patch  B12 deficiency B12 132, low --sarted B12 Wednesday PO daily  DVT prophylaxis: SCDs Code Status: Full code Family Communication: No family present at bedside  Disposition Plan:  Status is: Inpatient  Remains inpatient appropriate because:Ongoing diagnostic testing needed not appropriate for outpatient work up and Inpatient level of care appropriate due to severity of illness patient needs 14 days of close monitoring following subarachnoid hemorrhage status post coiling for frequent transcranial Dopplers monitoring for vasospasm   Dispo: The patient is from: Home              Anticipated d/c is to: Home              Anticipated d/c date is: 2 days              Patient currently is not medically stable to d/c.    Consultants:   Interventional radiology  Neurology  Procedures:   Left frontoparietal SAH secondary to left MCA aneurysm rupture status post emergent coiling 5/4  Antimicrobials:   None   Subjective: Patient seen and examined bedside, resting comfortably.  Reports good sleep overnight.  Slightly anxious about procedure tomorrow.  No other complaints or concerns at this time.  Interventional neurology planning repeat CT angiogram tomorrow with possible angioplasty/stent placement if needed. Denies headache, no visual changes, no  chest pain, no palpitations, no shortness of breath, no nausea/vomiting/diarrhea, no fever/chills/night sweats, no weakness.  No other acute events overnight per nursing  staff.  Objective: Vitals:   08/17/19 2007 08/18/19 0022 08/18/19 0411 08/18/19 0824  BP: 134/77 118/75 125/76 110/84  Pulse: 66 (!) 58 62 72  Resp: 18 16 18 20   Temp: 98.1 F (36.7 C) 97.6 F (36.4 C) 98.2 F (36.8 C) (!) 97.5 F (36.4 C)  TempSrc: Oral Oral Oral Oral  SpO2: 99% 100% 98% 99%  Weight:      Height:       No intake or output data in the 24 hours ending 08/18/19 1049 Filed Weights   08/06/19 1213  Weight: 86.2 kg    Examination:  General exam: Appears calm and comfortable  Respiratory system: Clear to auscultation. Respiratory effort normal. Cardiovascular system: S1 & S2 heard, RRR. No JVD, murmurs, rubs, gallops or clicks. No pedal edema. Gastrointestinal system: Abdomen is nondistended, soft and nontender. No organomegaly or masses felt. Normal bowel sounds heard. Central nervous system: Alert and oriented. No focal neurological deficits. Extremities: Symmetric 5 x 5 power. Skin: No rashes, lesions or ulcers Psychiatry: Judgement and insight appear normal. Mood & affect appropriate.     Data Reviewed: I have personally reviewed following labs and imaging studies  CBC: No results for input(s): WBC, NEUTROABS, HGB, HCT, MCV, PLT in the last 168 hours. Basic Metabolic Panel: No results for input(s): NA, K, CL, CO2, GLUCOSE, BUN, CREATININE, CALCIUM, MG, PHOS in the last 168 hours. GFR: Estimated Creatinine Clearance: 99.4 mL/min (by C-G formula based on SCr of 0.73 mg/dL). Liver Function Tests: No results for input(s): AST, ALT, ALKPHOS, BILITOT, PROT, ALBUMIN in the last 168 hours. No results for input(s): LIPASE, AMYLASE in the last 168 hours. No results for input(s): AMMONIA in the last 168 hours. Coagulation Profile: No results for input(s): INR, PROTIME in the last 168 hours. Cardiac Enzymes: No results for input(s): CKTOTAL, CKMB, CKMBINDEX, TROPONINI in the last 168 hours. BNP (last 3 results) No results for input(s): PROBNP in the last 8760  hours. HbA1C: No results for input(s): HGBA1C in the last 72 hours. CBG: Recent Labs  Lab 08/16/19 0549  GLUCAP 99   Lipid Profile: No results for input(s): CHOL, HDL, LDLCALC, TRIG, CHOLHDL, LDLDIRECT in the last 72 hours. Thyroid Function Tests: No results for input(s): TSH, T4TOTAL, FREET4, T3FREE, THYROIDAB in the last 72 hours. Anemia Panel: No results for input(s): VITAMINB12, FOLATE, FERRITIN, TIBC, IRON, RETICCTPCT in the last 72 hours. Sepsis Labs: No results for input(s): PROCALCITON, LATICACIDVEN in the last 168 hours.  No results found for this or any previous visit (from the past 240 hour(s)).       Radiology Studies: EEG  Result Date: 08/16/2019 08/18/2019, MD     08/16/2019 11:56 AM Patient Name: FREDDIE DYMEK MRN: [name] Attending: [id number] Referring Physician/Provider: Dr. Charlsie Quest Date: 08/16/2019 Duration: 24.28 minutes Patient history: 64 year old male with subarachnoid hemorrhage secondary to left MCA aneurysm status post coiling with mild aphasia at baseline and abrupt onset of garbled speech which improved after some time in morning.  EEG evaluate for seizures. Level of alertness: Awake, asleep AEDs during EEG study: None Technical aspects: This EEG study was done with scalp electrodes positioned according to the 10-20 International system of electrode placement. Electrical activity was acquired at a sampling rate of 500Hz  and reviewed with a high frequency filter of 70Hz  and a low frequency  filter of 1Hz . EEG data were recorded continuously and digitally stored. Description: The posterior dominant rhythm consists of 9-10 Hz activity of moderate voltage (25-35 uV) seen predominantly in posterior head regions, symmetric and reactive to eye opening and eye closing.  Sleep was characterized by vertex waves, sleep spindles (12 to 14 Hz), maximal frontocentral region.  Hyperventilation and photic stimulation were not performed.   IMPRESSION:  This study is within normal limits. No seizures or epileptiform discharges were seen throughout the recording. [name]   VAS [place] TRANSCRANIAL DOPPLER  Result Date: 08/16/2019  Transcranial Doppler Indications: Subarachnoid hemorrhage. Comparison Study: 08/14/19 previous Performing Technologist: Abram Sander RVS  Examination Guidelines: A complete evaluation includes B-mode imaging, spectral Doppler, color Doppler, and power Doppler as needed of all accessible portions of each vessel. Bilateral testing is considered an integral part of a complete examination. Limited examinations for reoccurring indications may be performed as noted.  +----------+-------------+----------+-----------+-------+ RIGHT TCD Right VM (cm)Depth (cm)PulsatilityComment +----------+-------------+----------+-----------+-------+ MCA           71.00                 1.12            +----------+-------------+----------+-----------+-------+ ACA          -38.00                 1.35            +----------+-------------+----------+-----------+-------+ Term ICA      25.00                 1.16            +----------+-------------+----------+-----------+-------+ PCA           20.00                 1.43            +----------+-------------+----------+-----------+-------+ Opthalmic     23.00                 1.67            +----------+-------------+----------+-----------+-------+ ICA siphon    42.00                 1.42            +----------+-------------+----------+-----------+-------+ Vertebral    -14.00                 1.05            +----------+-------------+----------+-----------+-------+  +----------+------------+----------+-----------+-------+ LEFT TCD  Left VM (cm)Depth (cm)PulsatilityComment +----------+------------+----------+-----------+-------+ MCA          63.00                 1.14            +----------+------------+----------+-----------+-------+ ACA          -46.00                 1.00            +----------+------------+----------+-----------+-------+ Term ICA     43.00                 1.03            +----------+------------+----------+-----------+-------+ PCA          39.00                 1.36            +----------+------------+----------+-----------+-------+ Opthalmic  27.00                 1.39            +----------+------------+----------+-----------+-------+ ICA siphon   43.00                 1.40            +----------+------------+----------+-----------+-------+ Vertebral    -18.00                1.33            +----------+------------+----------+-----------+-------+  +------------+-------+-------+             VM cm/sComment +------------+-------+-------+ Prox Basilar-19.00         +------------+-------+-------+ Summary: This was a normal transcranial Doppler study, with normal flow direction and velocity of all identified vessels of the anterior and posterior circulations, with no evidence of stenosis, vasospasm or occlusion. There was no evidence of intracranial disease.  Mildly elevated pulsatility indices throughout of unclear significance *See table(s) above for TCD measurements and observations.  Diagnosing physician: Delia Heady MD Electronically signed by Delia Heady MD on 08/16/2019 at 5:08:01 PM.    Final         Scheduled Meds: . aspirin  81 mg Oral Daily  . atorvastatin  40 mg Oral Daily  . folic acid  1 mg Oral Daily  . multivitamin with minerals  1 tablet Oral Daily  . nicotine  14 mg Transdermal Daily  . niMODipine  60 mg Oral Q4H  . pantoprazole  40 mg Oral Daily  . pneumococcal 23 valent vaccine  0.5 mL Intramuscular Tomorrow-1000  . senna-docusate  1 tablet Oral BID  . thiamine  100 mg Oral Daily   Or  . thiamine  100 mg Intravenous Daily  . ticagrelor  90 mg Oral BID  . vitamin B-12  1,000 mcg Oral Daily   Continuous Infusions:    LOS: 12 days    Time spent: 34 minutes spent  on chart review, discussion with nursing staff, consultants, updating family and interview/physical exam; more than 50% of that time was spent in counseling and/or coordination of care.    Alvira Philips [place], DO [facility] Available via Epic secure chat 7am-7pm After these hours, please refer to coverage provider listed on [email or web address] 08/18/2019, 10:49 AM

## 2019-08-19 ENCOUNTER — Inpatient Hospital Stay (HOSPITAL_COMMUNITY): Payer: Managed Care, Other (non HMO) | Admitting: Certified Registered Nurse Anesthetist

## 2019-08-19 ENCOUNTER — Inpatient Hospital Stay (HOSPITAL_COMMUNITY): Payer: Managed Care, Other (non HMO)

## 2019-08-19 ENCOUNTER — Encounter (HOSPITAL_COMMUNITY)
Admission: EM | Disposition: A | Discharge status: Home / Self Care | Payer: Self-pay | Source: Home / Self Care | Attending: Neuroradiology

## 2019-08-19 HISTORY — PX: IR INTRA CRAN STENT: IMG2345

## 2019-08-19 HISTORY — PX: IR CT HEAD LTD: IMG2386

## 2019-08-19 HISTORY — PX: RADIOLOGY WITH ANESTHESIA: SHX6223

## 2019-08-19 HISTORY — PX: IR 3D INDEPENDENT WKST: IMG2385

## 2019-08-19 HISTORY — PX: IR US GUIDE VASC ACCESS RIGHT: IMG2390

## 2019-08-19 LAB — BASIC METABOLIC PANEL
Anion gap: 9 (ref 5–15)
BUN: 17 mg/dL (ref 8–23)
CO2: 21 mmol/L — ABNORMAL LOW (ref 22–32)
Calcium: 9.3 mg/dL (ref 8.9–10.3)
Chloride: 109 mmol/L (ref 98–111)
Creatinine, Ser: 0.93 mg/dL (ref 0.61–1.24)
GFR calc Af Amer: >60 mL/min (ref >60)
GFR calc non Af Amer: >60 mL/min (ref >60)
Glucose, Bld: 104 mg/dL — ABNORMAL HIGH (ref 70–99)
Potassium: 3.8 mmol/L (ref 3.5–5.1)
Sodium: 139 mmol/L (ref 135–145)

## 2019-08-19 LAB — CBC
HCT: 38.2 % — ABNORMAL LOW (ref 39.0–52.0)
Hemoglobin: 12.8 g/dL — ABNORMAL LOW (ref 13.0–17.0)
MCH: 34.6 pg — ABNORMAL HIGH (ref 26.0–34.0)
MCHC: 33.5 g/dL (ref 30.0–36.0)
MCV: 103.2 fL — ABNORMAL HIGH (ref 80.0–100.0)
Platelets: 431 10*3/uL — ABNORMAL HIGH (ref 150–400)
RBC: 3.7 MIL/uL — ABNORMAL LOW (ref 4.22–5.81)
RDW: 12.4 % (ref 11.5–15.5)
WBC: 10.2 10*3/uL (ref 4.0–10.5)
nRBC: 0 % (ref 0.0–0.2)

## 2019-08-19 LAB — POCT ACTIVATED CLOTTING TIME
Activated Clotting Time: 136 seconds
Activated Clotting Time: 191 seconds

## 2019-08-19 LAB — MAGNESIUM: Magnesium: 1.6 mg/dL — ABNORMAL LOW (ref 1.7–2.4)

## 2019-08-19 SURGERY — IR WITH ANESTHESIA
Anesthesia: General

## 2019-08-19 MED ORDER — IOHEXOL 240 MG/ML SOLN: 150.0000 mL | Freq: Once | INTRAMUSCULAR | Status: DC | PRN

## 2019-08-19 MED ORDER — SODIUM CHLORIDE 0.9 % IV SOLN: INTRAVENOUS | Status: DC

## 2019-08-19 MED ORDER — VERAPAMIL HCL 2.5 MG/ML IV SOLN
INTRAVENOUS | Status: AC | PRN
  Administered 2019-08-19: 5 mg via INTRA_ARTERIAL

## 2019-08-19 MED ORDER — VERAPAMIL HCL 2.5 MG/ML IV SOLN
INTRAVENOUS | Status: AC
  Filled 2019-08-19: qty 2

## 2019-08-19 MED ORDER — ACETAMINOPHEN 10 MG/ML IV SOLN: 1000.0000 mg | Freq: Once | INTRAVENOUS | Status: DC | PRN

## 2019-08-19 MED ORDER — IOHEXOL 240 MG/ML SOLN
INTRAMUSCULAR | Status: AC
  Filled 2019-08-19: qty 100

## 2019-08-19 MED ORDER — TICAGRELOR 90 MG PO TABS
90.0000 mg | ORAL_TABLET | Freq: Every day | ORAL | Status: DC
  Administered 2019-08-20: 90 mg via ORAL
  Filled 2019-08-19: qty 1

## 2019-08-19 MED ORDER — FENTANYL CITRATE (PF) 250 MCG/5ML IJ SOLN
INTRAMUSCULAR | Status: DC | PRN
  Administered 2019-08-19: 50 ug via INTRAVENOUS
  Administered 2019-08-19: 100 ug via INTRAVENOUS

## 2019-08-19 MED ORDER — PROPOFOL 10 MG/ML IV BOLUS
INTRAVENOUS | Status: DC | PRN
  Administered 2019-08-19: 50 mg via INTRAVENOUS
  Administered 2019-08-19: 150 mg via INTRAVENOUS

## 2019-08-19 MED ORDER — LACTATED RINGERS IV SOLN: INTRAVENOUS | Status: DC | PRN

## 2019-08-19 MED ORDER — CLEVIDIPINE BUTYRATE 0.5 MG/ML IV EMUL: 0.0000 mg/h | INTRAVENOUS | Status: DC

## 2019-08-19 MED ORDER — FENTANYL CITRATE (PF) 250 MCG/5ML IJ SOLN
INTRAMUSCULAR | Status: AC
  Filled 2019-08-19: qty 5

## 2019-08-19 MED ORDER — OXYCODONE HCL 5 MG PO TABS: 5.0000 mg | ORAL_TABLET | Freq: Once | ORAL | Status: DC | PRN

## 2019-08-19 MED ORDER — IOHEXOL 240 MG/ML SOLN
150.0000 mL | Freq: Once | INTRAMUSCULAR | Status: AC | PRN
  Administered 2019-08-19: 70 mL via INTRA_ARTERIAL

## 2019-08-19 MED ORDER — OXYCODONE HCL 5 MG/5ML PO SOLN: 5.0000 mg | Freq: Once | ORAL | Status: DC | PRN

## 2019-08-19 MED ORDER — IOHEXOL 300 MG/ML  SOLN
100.0000 mL | Freq: Once | INTRAMUSCULAR | Status: AC | PRN
  Administered 2019-08-19: 25 mL via INTRA_ARTERIAL

## 2019-08-19 MED ORDER — HYDROMORPHONE HCL 1 MG/ML IJ SOLN: 0.2500 mg | INTRAMUSCULAR | Status: DC | PRN

## 2019-08-19 MED ORDER — ONDANSETRON HCL 4 MG/2ML IJ SOLN
INTRAMUSCULAR | Status: DC | PRN
  Administered 2019-08-19: 4 mg via INTRAVENOUS

## 2019-08-19 MED ORDER — PHENYLEPHRINE HCL-NACL 10-0.9 MG/250ML-% IV SOLN
INTRAVENOUS | Status: DC | PRN
  Administered 2019-08-19: 20 ug/min via INTRAVENOUS

## 2019-08-19 MED ORDER — HEPARIN SODIUM (PORCINE) 1000 UNIT/ML IJ SOLN
INTRAMUSCULAR | Status: DC | PRN
  Administered 2019-08-19: 5000 [IU] via INTRAVENOUS
  Administered 2019-08-19: 1000 [IU] via INTRAVENOUS

## 2019-08-19 MED ORDER — IOHEXOL 240 MG/ML SOLN
150.0000 mL | Freq: Once | INTRAMUSCULAR | Status: AC | PRN
  Administered 2019-08-19: 20 mL via INTRA_ARTERIAL

## 2019-08-19 MED ORDER — PROMETHAZINE HCL 25 MG/ML IJ SOLN: 6.2500 mg | INTRAMUSCULAR | Status: DC | PRN

## 2019-08-19 MED ORDER — ROCURONIUM BROMIDE 10 MG/ML (PF) SYRINGE
PREFILLED_SYRINGE | INTRAVENOUS | Status: DC | PRN
  Administered 2019-08-19: 20 mg via INTRAVENOUS
  Administered 2019-08-19: 60 mg via INTRAVENOUS
  Administered 2019-08-19: 20 mg via INTRAVENOUS

## 2019-08-19 MED ORDER — CHLORHEXIDINE GLUCONATE CLOTH 2 % EX PADS
6.0000 | MEDICATED_PAD | Freq: Every day | CUTANEOUS | Status: DC
  Administered 2019-08-19: 6 via TOPICAL

## 2019-08-19 MED ORDER — SUGAMMADEX SODIUM 200 MG/2ML IV SOLN
INTRAVENOUS | Status: DC | PRN
  Administered 2019-08-19: 200 mg via INTRAVENOUS

## 2019-08-19 MED ORDER — DEXAMETHASONE SODIUM PHOSPHATE 10 MG/ML IJ SOLN
INTRAMUSCULAR | Status: DC | PRN
  Administered 2019-08-19: 4 mg via INTRAVENOUS

## 2019-08-19 MED ORDER — LIDOCAINE 2% (20 MG/ML) 5 ML SYRINGE
INTRAMUSCULAR | Status: DC | PRN
  Administered 2019-08-19: 60 mg via INTRAVENOUS

## 2019-08-19 NOTE — Sedation Documentation (Signed)
ACT 191 

## 2019-08-19 NOTE — Anesthesia Preprocedure Evaluation (Signed)
Anesthesia Evaluation    Reviewed: Allergy & Precautions, Patient's Chart, lab work & pertinent test results  Airway Mallampati: II  TM Distance: >3 FB Neck ROM: Full    Dental no notable dental hx.    Pulmonary Current SmokerPatient did not abstain from smoking.,    Pulmonary exam normal breath sounds clear to auscultation       Cardiovascular negative cardio ROS Normal cardiovascular exam Rhythm:Regular Rate:Normal  ECG: SR, rate 76  ECHO: 1. Left ventricular ejection fraction, by estimation, is 60 to 65%. The left ventricle has normal function. The left ventricle has no regional wall motion abnormalities. Left ventricular diastolic parameters are consistent with Grade II diastolic dysfunction (pseudonormalization).  2. Right ventricular systolic function is normal. The right ventricular size is normal.  3. The mitral valve is normal in structure. No evidence of mitral valve regurgitation. No evidence of mitral stenosis.  4. The aortic valve was not well visualized. Aortic valve regurgitation is not visualized. No aortic stenosis is present.  5. Aortic dilatation noted. 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  6. The inferior vena cava is normal in size with greater than 50% respiratory variability, suggesting right atrial pressure of 3 mmHg.   Conclusion(s)/Recommendation(s): No intracardiac source of embolism detected on this transthoracic study. A transesophageal echocardiogram is  recommended to exclude cardiac source of embolism if clinically indicated.    Neuro/Psych Intracranial aneurysm Issues with spech CVA, Residual Symptoms    GI/Hepatic negative GI ROS, Neg liver ROS,   Endo/Other  negative endocrine ROS  Renal/GU negative Renal ROS     Musculoskeletal negative musculoskeletal ROS (+)   Abdominal   Peds  Hematology  (+) anemia ,   Anesthesia Other Findings STROKE  Reproductive/Obstetrics                             Anesthesia Physical Anesthesia Plan  ASA: III  Anesthesia Plan: General   Post-op Pain Management:    Induction: Intravenous  PONV Risk Score and Plan: 1 and Ondansetron, Dexamethasone and Treatment may vary due to age or medical condition  Airway Management Planned: Oral ETT  Additional Equipment: Arterial line  Intra-op Plan:   Post-operative Plan: Extubation in OR  Informed Consent: I have reviewed the patients History and Physical, chart, labs and discussed the procedure including the risks, benefits and alternatives for the proposed anesthesia with the patient or authorized representative who has indicated his/her understanding and acceptance.     Dental advisory given  Plan Discussed with: CRNA  Anesthesia Plan Comments:         Anesthesia Quick Evaluation

## 2019-08-19 NOTE — Progress Notes (Addendum)
Pt A-line and cuff pressures do not correlate. Aline is leaking slightly and appears positional. Instructed by MD to use cuff pressure.

## 2019-08-19 NOTE — Progress Notes (Signed)
PROGRESS NOTE    Victor Pena  MWN:[id number] DOB: 1955-06-06 DOA: 08/06/2019 PCP: Patient, No Pcp Per    Brief Narrative:  Victor Pena is a 64 y.o. male with medical history significant of intracranial aneurysm who initially presented with complaints of difficulty speaking with right hand weakness and numbness.  CT scan of the brain revealed a subarachnoid bleed or patient was noted to have a left MCA aneurysm.  IR was consulted and performed cerebral arteriogram with emergent balloon assisted coiling on 5/4 by Dr. Sherlon Handing.  Since that time patient expressive aphasia has continued to improve daily.  Patient is to remain in hospital for total of 14 days to monitor for vasospasm with daily transcranial Dopplers.   Assessment & Plan:   Active Problems:   Subarachnoid hemorrhage (HCC)   Brain aneurysm   Difficulty with speech  Aneurysmal subarachnoid hemorrhagestatus post coiling on 5/4  Patient presenting with dysphagia was found to have a subarachnoid hemorrhage with a ruptured left MCA bifurcation patient aneurysm, patient underwent cooling by interventional radiology on 08/06/2019.  CT angiogram head 5/14 notable for decreased size of perisylvian subarachnoid hemorrhage. --Continue close monitoring by IR/neurology for spasm; Day #12 of 14 --Continue aspirin, Lipitor, amlodipine --Nimodipine 60mg  PO q4h; plan 21-day course --Repeat CT angiogram head planned for today, continue n.p.o.   Acute left temporal/parietal CVA Patient this morning with slurred speech, had CT angiogram head with small cortical based infarct clustered in the posterior left temporal and low parietal cortex which is new since brain MR 9 days prior; also notable decreased in perisylvian SAH. EEG within normal limits, no seizures or epileptiform discharges noted.  Neurology believes this could be related to the amount of exposed coil in his wide-neck aneurysm. --Restarted Brilinta 90 mg p.o. twice daily 5/14  --Interventional neurology recommends repeating CT angiogram head today with possible need of angioplasty with stent placement  Hyperlipidemia LDL 149, on Lipitor 40 mg  Metabolic acidosis Likely from dehydration.  Alcohol abuse No signs of withdrawal symptoms.  Counseled on need for cessation. --continue thiamine, folic acid, multivitamin  Tobacco abuse Counseled on need for cessation. --Nicotine patch  B12 deficiency B12 132, low --sarted B12 6/14 PO daily  DVT prophylaxis: SCDs Code Status: Full code Family Communication: No family present at bedside  Disposition Plan:  Status is: Inpatient  Remains inpatient appropriate because:Ongoing diagnostic testing needed not appropriate for outpatient work up and Inpatient level of care appropriate due to severity of illness patient needs 14 days of close monitoring following subarachnoid hemorrhage status post coiling for frequent transcranial Dopplers monitoring for vasospasm   Dispo: The patient is from: Home              Anticipated d/c is to: Home              Anticipated d/c date is: 2 days              Patient currently is not medically stable to d/c.    Consultants:   Interventional radiology  Neurology  Procedures:   Left frontoparietal SAH secondary to left MCA aneurysm rupture status post emergent coiling 5/4  Antimicrobials:   None   Subjective: Patient seen and examined bedside, resting comfortably.  Awaiting procedure today.  Reports good sleep overnight.  No other specific complaints or concerns at this time.  Denies headache, no visual changes, no chest pain, no palpitations, no shortness of breath, no nausea/vomiting/diarrhea, no fever/chills/night sweats, no weakness.  No other acute  events overnight per nursing staff.  Objective: Vitals:   08/18/19 1922 08/18/19 2355 08/19/19 0342 08/19/19 0848  BP: 123/80 107/87 112/65 125/78  Pulse: 71 (!) 55 64 60  Resp: 18 17 15 18   Temp: 98.2 F  (36.8 C) 97.9 F (36.6 C) 98 F (36.7 C) 97.9 F (36.6 C)  TempSrc: Oral Oral Oral Oral  SpO2: 99% 100% 97% 100%  Weight:      Height:        Intake/Output Summary (Last 24 hours) at 08/19/2019 0930 Last data filed at 08/18/2019 1254 Gross per 24 hour  Intake 240 ml  Output -  Net 240 ml   Filed Weights   08/06/19 1213  Weight: 86.2 kg    Examination:  General exam: Appears calm and comfortable  Respiratory system: Clear to auscultation. Respiratory effort normal. Cardiovascular system: S1 & S2 heard, RRR. No JVD, murmurs, rubs, gallops or clicks. No pedal edema. Gastrointestinal system: Abdomen is nondistended, soft and nontender. No organomegaly or masses felt. Normal bowel sounds heard. Central nervous system: Alert and oriented. No focal neurological deficits. Extremities: Symmetric 5 x 5 power. Skin: No rashes, lesions or ulcers Psychiatry: Judgement and insight appear normal. Mood & affect appropriate.     Data Reviewed: I have personally reviewed following labs and imaging studies  CBC: Recent Labs  Lab 08/15/19 0417 08/19/19 0320  WBC  --  10.2  HGB  --  12.8*  HCT 36.7* 38.2*  MCV  --  103.2*  PLT  --  099*   Basic Metabolic Panel: Recent Labs  Lab 08/19/19 0320  NA 139  K 3.8  CL 109  CO2 21*  GLUCOSE 104*  BUN 17  CREATININE 0.93  CALCIUM 9.3  MG 1.6*   GFR: Estimated Creatinine Clearance: 85.5 mL/min (by C-G formula based on SCr of 0.93 mg/dL). Liver Function Tests: No results for input(s): AST, ALT, ALKPHOS, BILITOT, PROT, ALBUMIN in the last 168 hours. No results for input(s): LIPASE, AMYLASE in the last 168 hours. No results for input(s): AMMONIA in the last 168 hours. Coagulation Profile: No results for input(s): INR, PROTIME in the last 168 hours. Cardiac Enzymes: No results for input(s): CKTOTAL, CKMB, CKMBINDEX, TROPONINI in the last 168 hours. BNP (last 3 results) No results for input(s): PROBNP in the last 8760 hours.  HbA1C: No results for input(s): HGBA1C in the last 72 hours. CBG: Recent Labs  Lab 08/16/19 0549  GLUCAP 99   Lipid Profile: No results for input(s): CHOL, HDL, LDLCALC, TRIG, CHOLHDL, LDLDIRECT in the last 72 hours. Thyroid Function Tests: No results for input(s): TSH, T4TOTAL, FREET4, T3FREE, THYROIDAB in the last 72 hours. Anemia Panel: No results for input(s): VITAMINB12, FOLATE, FERRITIN, TIBC, IRON, RETICCTPCT in the last 72 hours. Sepsis Labs: No results for input(s): PROCALCITON, LATICACIDVEN in the last 168 hours.  No results found for this or any previous visit (from the past 240 hour(s)).       Radiology Studies: No results found.      Scheduled Meds: . aspirin  81 mg Oral Daily  . atorvastatin  40 mg Oral Daily  . folic acid  1 mg Oral Daily  . multivitamin with minerals  1 tablet Oral Daily  . nicotine  14 mg Transdermal Daily  . niMODipine  60 mg Oral Q4H  . pantoprazole  40 mg Oral Daily  . pneumococcal 23 valent vaccine  0.5 mL Intramuscular Tomorrow-1000  . senna-docusate  1 tablet Oral BID  . thiamine  100 mg Oral Daily   Or  . thiamine  100 mg Intravenous Daily  . ticagrelor  90 mg Oral BID  . vitamin B-12  1,000 mcg Oral Daily   Continuous Infusions:    LOS: 13 days    Time spent: 34 minutes spent on chart review, discussion with nursing staff, consultants, updating family and interview/physical exam; more than 50% of that time was spent in counseling and/or coordination of care.    Alvira Philips [place], DO [facility] Available via Epic secure chat 7am-7pm After these hours, please refer to coverage provider listed on [email or web address] 08/19/2019, 9:30 AM

## 2019-08-19 NOTE — Sedation Documentation (Signed)
perclose applied to rt groin. Small amount of oozing. Dressing with gauze tegaderm and quick clot applied.

## 2019-08-19 NOTE — Anesthesia Procedure Notes (Signed)
Procedure Name: Intubation Date/Time: 08/19/2019 4:39 PM Performed by: Sammie Bench, CRNA Pre-anesthesia Checklist: Patient identified, Emergency Drugs available, Suction available and Patient being monitored Patient Re-evaluated:Patient Re-evaluated prior to induction Oxygen Delivery Method: Circle System Utilized Preoxygenation: Pre-oxygenation with 100% oxygen Induction Type: IV induction Ventilation: Mask ventilation without difficulty Laryngoscope Size: Mac, 4 and Glidescope Grade View: Grade II Tube type: Oral Number of attempts: 1 Airway Equipment and Method: Stylet and Oral airway Placement Confirmation: ETT inserted through vocal cords under direct vision,  positive ETCO2 and breath sounds checked- equal and bilateral Secured at: 24 cm Tube secured with: Tape Dental Injury: Teeth and Oropharynx as per pre-operative assessment  Comments: Look x 2 first with MAC 4, unable to visualize cords, patient swallowing, additional meds given, next look with glidescope, still swallowing, thrid look successful after additional meds given. +ETCO2, +BBS placed on vent

## 2019-08-19 NOTE — Progress Notes (Signed)
Case being delayed. Patient and wife notified. Contacted [facility] and patient transported back to [facility].

## 2019-08-19 NOTE — Progress Notes (Signed)
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 Karenann Cai. Patient awake and alert sitting in bed. Accompanied by wife at bedside. Complains of right arm numbness (specifically posterior forearm/elbow)- no change. States speech difficulties have subsided at this time, still hesitant with speech on exam. Cheeks/ears appear erythematous, wife states possibly related to new lotion.   Allergies: Other  Medications: Prior to Admission medications   Medication Sig Start Date End Date Taking? Authorizing Provider  acetaminophen (TYLENOL) 500 MG tablet Take 1,000 mg by mouth every 6 (six) hours as needed for mild pain or headache.   Yes [provider]  aspirin 81 MG EC tablet Take 81 mg by mouth daily. Swallow whole.   Yes [provider]  ibuprofen (ADVIL) 200 MG tablet Take 400 mg by mouth every 6 (six) hours as needed for moderate pain.   Yes [provider]  ticagrelor (BRILINTA) 90 MG TABS tablet Take 90 mg by mouth daily.    Yes [provider]  diazepam (VALIUM) 5 MG tablet Take 1 tablet 30 mins prior to MRI. May take second dose if needed. Patient not taking: Reported on 07/17/2019 07/02/19   Victor Sprang, MD     Vital Signs: BP 125/78 (BP Location: Right Arm)   Pulse 60   Temp 97.9 F (36.6 C) (Oral)   Resp 18   Ht 5\' 11"  (1.803 m)   Wt 190 lb (86.2 kg)   SpO2 100%   BMI 26.50 kg/m   Physical Exam Vitals and nursing note reviewed.  Constitutional:      General: [name] not in acute distress.    Appearance: Normal appearance.  Pulmonary:     Effort: Pulmonary effort is normal. No respiratory distress.  Skin:    General: Skin  is warm and dry.  Neurological:     Mental Status: [name] alert.     Comments: Alert, awake, and oriented x3. Follows simple commands. Hesitant with speech but expressive aphasia continues to improve. EOMs intact bilaterally without nystagmus or subjective diplopia. Mild right facial droop. Tongue midline. Hand grip 5/5 bilaterally, lower extremity strength 5/5 bilaterally. Facial and lower extremity sensation intact and equal bilaterally; patient reports decreased sensation of right forearm (posterior forearm/elbow) compared to left forearm.  No pronator drift.     Imaging: EEG  Result Date: 08/16/2019 [name], MD     08/16/2019 11:56 AM Patient Name: Victor Pena MRN: [id number] Epilepsy Attending: [name] Referring Physician/Provider: Dr. 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  EEG evaluate for seizures. Level of alertness: Awake, asleep AEDs during EEG study: None Technical aspects: This EEG study was done with scalp electrodes positioned according to the 10-20 International system of electrode placement. Electrical activity was acquired at a sampling rate of 500Hz  and reviewed with a high frequency filter of 70Hz  and a low frequency filter of 1Hz . EEG data were recorded continuously and digitally stored. Description: The posterior dominant rhythm consists of 9-10 Hz activity of moderate voltage (25-35 uV) seen predominantly in posterior head  regions, symmetric and reactive to eye opening and eye closing.  Sleep was characterized by vertex waves, sleep spindles (12 to 14 Hz), maximal frontocentral region.  Hyperventilation and photic stimulation were not performed.   IMPRESSION: This study is within normal limits. No seizures or epileptiform  discharges were seen throughout the recording. [name]   VAS [place] TRANSCRANIAL DOPPLER  Result Date: 08/16/2019  Transcranial Doppler Indications: Subarachnoid hemorrhage. Comparison Study: 08/14/19 previous Performing Technologist: Blanch Media RVS  Examination Guidelines: A complete evaluation includes B-mode imaging, spectral Doppler, color Doppler, and power Doppler as needed of all accessible portions of each vessel. Bilateral testing is considered an integral part of a complete examination. Limited examinations for reoccurring indications may be performed as noted.  +----------+-------------+----------+-----------+-------+ RIGHT TCD Right VM (cm)Depth (cm)PulsatilityComment +----------+-------------+----------+-----------+-------+ MCA           71.00                 1.12            +----------+-------------+----------+-----------+-------+ ACA          -38.00                 1.35            +----------+-------------+----------+-----------+-------+ Term ICA      25.00                 1.16            +----------+-------------+----------+-----------+-------+ PCA           20.00                 1.43            +----------+-------------+----------+-----------+-------+ Opthalmic     23.00                 1.67            +----------+-------------+----------+-----------+-------+ ICA siphon    42.00                 1.42            +----------+-------------+----------+-----------+-------+ Vertebral    -14.00                 1.05            +----------+-------------+----------+-----------+-------+  +----------+------------+----------+-----------+-------+ LEFT TCD  Left VM (cm)Depth (cm)PulsatilityComment +----------+------------+----------+-----------+-------+ MCA          63.00                 1.14            +----------+------------+----------+-----------+-------+ ACA          -46.00                1.00             +----------+------------+----------+-----------+-------+ Term ICA     43.00                 1.03            +----------+------------+----------+-----------+-------+ PCA          39.00                 1.36            +----------+------------+----------+-----------+-------+ Opthalmic    27.00                 1.39            +----------+------------+----------+-----------+-------+  ICA siphon   43.00                 1.40            +----------+------------+----------+-----------+-------+ Vertebral    -18.00                1.33            +----------+------------+----------+-----------+-------+  +------------+-------+-------+             VM cm/sComment +------------+-------+-------+ Prox Basilar-19.00         +------------+-------+-------+ Summary: This was a normal transcranial Doppler study, with normal flow direction and velocity of all identified vessels of the anterior and posterior circulations, with no evidence of stenosis, vasospasm or occlusion. There was no evidence of intracranial disease.  Mildly elevated pulsatility indices throughout of unclear significance *See table(s) above for TCD measurements and observations.  Diagnosing physician: Delia Heady MD Electronically signed by Delia Heady MD on 08/16/2019 at 5:08:01 PM.    Final    CT ANGIO HEAD CODE STROKE  Result Date: 08/16/2019 CLINICAL DATA:  Speech difficulty. EXAM: CT ANGIOGRAPHY HEAD TECHNIQUE: Multidetector CT imaging of the head was performed using the standard protocol during bolus administration of intravenous contrast. Multiplanar CT image reconstructions and MIPs were obtained to evaluate the vascular anatomy. CONTRAST:  80 cc Omnipaque 350 intravenous COMPARISON:  Brain MRI from 9 days ago FINDINGS: CT HEAD Brain: Decrease in left perisylvian subarachnoid hemorrhage. 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 No hydrocephalus or extra-axial collection. Vascular: Left MCA bifurcation aneurysm coil mass Skull: Normal. Negative for fracture or focal lesion. Sinuses: Opacified right posterior ethmoid air cell. Orbits: Negative CTA HEAD Anterior circulation: Left MCA bifurcation aneurysm coil mass with associated streak artifact. No detected residual sac enhancement. The adjacent vessels are patent without visible luminal thrombus, but limited adjacent to the treatment site. No major downstream branch occlusion is seen. No embolic disease seen to the other anterior vessels. 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 Posterior circulation: Symmetric vertebral arteries. The vertebral and basilar arteries are smooth and widely patent. Fetal type right PCA. No branch occlusion, beading, or aneurysm. Venous sinuses: Unremarkable in the arterial phase Anatomic variants: As above These results were communicated to Dr Antionette Char at [facility] 5/14/2021by text page via the [facility] messaging system. IMPRESSION: 1. 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 2. Left MCA bifurcation aneurysm coiling with expected regional artifact. 3. Decreasing left perisylvian subarachnoid hemorrhage. Electronically Signed   By: Marnee Spring M.D.   On: 08/16/2019 07:32    Labs:  CBC: Recent Labs    08/09/19 0916 08/09/19 0916 08/10/19 0559 08/11/19 0628 08/15/19 0417 08/19/19 0320  WBC 7.4  --  7.2 7.5  --  10.2  HGB 13.2  --  13.0 13.6  --  12.8*  HCT 40.0   < > 38.4* 39.9 36.7* 38.2*  PLT 269  --  296 338  --  431*   < > = values in this interval not displayed.    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 10/06/2019. Discussed RUE numbness (stable at this time) and speech difficulties (improved at this time)- plan for image-guided cerebral arteriogram with possible stent placement today in IR. Discussed procedure with patient and wife. All questions answered and concerns addressed.  Continue Nimodipine for vasospasm prophylaxis.  Continue taking Aspirin 81 mg once daily. P2Y12 1 PRU yesterday- discussed with Dr. Tommie Sams who recommends patient discontinue Brilinta 90 mg twice daily and begin taking Brilinta 90 mg once daily. Alcohol/tobacco abuse- on CIWA protocol, Nicotine patch in use. Appreciate PT/OT/speech evaluations- continue with SLP/OT outpatient per recommendations. Further plans per [facility]- appreciate assistance with patient. NIR to follow.   Electronically Signed: [facility], PA-C 08/19/2019, 10:34 AM   I spent a total of 25 Minutes at the the patient's bedside AND on the patient's hospital floor or unit, greater than 50% of which was counseling/coordinating care for ruptured left MCA bifurcation aneurysm s/p coiling.

## 2019-08-19 NOTE — Procedures (Signed)
INTERVENTIONAL NEURORADIOLOGY BRIEF POSTPROCEDURE NOTE  Attending: Dr. Baldemar Lenis  Assistant: None  Diagnosis: Left MCA bifurcation aneurysm s/p coiling  Access site: RCFA  Access closure: Perclose proglide  Anesthesia: Genereal  Medication used: refer to anesthesia documentation.  Complications: None  Estimated blood loss: Minimal  Specimen: N/A  Findings: Left MCA coiling mass slightly protruding into the daughter artery lumen without floor limitation. Placement of a 4 x 24 mm atlas stent in the left MCA, across the neck of the aneurysm.  The patient tolerated the procedure well without incident or complication and is in stable condition.

## 2019-08-19 NOTE — Transfer of Care (Signed)
Immediate Anesthesia Transfer of Care Note  Patient: Victor Pena  Procedure(s) Performed: STENT PLACEMENT (N/A )  Patient Location: PACU  Anesthesia Type:General  Level of Consciousness: awake, alert , oriented and patient cooperative  Airway & Oxygen Therapy: Patient Spontanous Breathing and Patient connected to nasal cannula oxygen  Post-op Assessment: Report given to RN, Post -op Vital signs reviewed and stable and Patient moving all extremities X 4  Post vital signs: Reviewed and stable  Last Vitals:  Vitals Value Taken Time  BP 118/83 08/19/19 1916  Temp    Pulse 79 08/19/19 1920  Resp 15 08/19/19 1920  SpO2 99 % 08/19/19 1920  Vitals shown include unvalidated device data.  Last Pain:  Vitals:   08/19/19 1201  TempSrc: Oral  PainSc:       Patients Stated Pain Goal: 0 (08/18/19 0700)  Complications: No apparent anesthesia complications

## 2019-08-19 NOTE — Sedation Documentation (Signed)
stent deployed

## 2019-08-19 NOTE — Sedation Documentation (Signed)
ACT 136

## 2019-08-19 NOTE — Anesthesia Postprocedure Evaluation (Signed)
Anesthesia Post Note  Patient: Victor Pena  Procedure(s) Performed: STENT PLACEMENT (N/A )     Patient location during evaluation: PACU Anesthesia Type: General Level of consciousness: awake Pain management: pain level controlled Vital Signs Assessment: post-procedure vital signs reviewed and stable Respiratory status: spontaneous breathing, nonlabored ventilation, respiratory function stable and patient connected to nasal cannula oxygen Cardiovascular status: blood pressure returned to baseline and stable Postop Assessment: no apparent nausea or vomiting Anesthetic complications: no    Last Vitals:  Vitals:   08/19/19 2000 08/19/19 2025  BP: 128/80   Pulse: 67   Resp: 16   Temp: 36.4 C 36.9 C  SpO2: 100%     Last Pain:  Vitals:   08/19/19 2000  TempSrc:   PainSc: 0-No pain                 Nikoli Nasser P Marybelle Giraldo

## 2019-08-19 NOTE — Sedation Documentation (Signed)
SBAR given to [place], RN at bedside in PACU. Groin and pulses unchanged, see flowsheet.

## 2019-08-20 LAB — PLATELET INHIBITION P2Y12: Platelet Function  P2Y12: 126 [PRU] — ABNORMAL LOW (ref 182–335)

## 2019-08-20 MED ORDER — NICOTINE 14 MG/24HR TD PT24: 14.0000 mg | MEDICATED_PATCH | Freq: Every day | TRANSDERMAL | 0 refills | Status: DC

## 2019-08-20 NOTE — Discharge Summary (Addendum)
Patient ID: Victor Pena MRN: [id number] DOB/AGE: Mar 19, 1956 64 y.o.  Admit date: 08/06/2019 Discharge date: 08/20/2019  Supervising Physician: Pedro Earls  Patient Status: [facility] - In-pt  Admission Diagnoses: Subarachnoid hemorrhage Brain aneurysm Difficulty with speech  Discharge Diagnoses:  Active Problems:   Subarachnoid hemorrhage ([place])   Brain aneurysm   Difficulty with speech   Discharged Condition: stable  Hospital Course:  Patient presented to [facility] ED 08/06/2019 with complaints of dysphasia and right hand numbness/tingling. In ED, code stroke was activated. CT head revealed an acute subarachnoid hemorrhage thought to be secondary to ruptured left MCA bifurcation aneurysm. Neurology was consulted and patient was admitted under neurology for further management. NIR was consulted for continuity of care as he is an established patient with Dr. Karenann Cai. He then underwent an image-guided cerebral arteriogram with staged embolization of ruptured left MCA bifurcation using primary coiling via right femoral artery 08/06/2019 by Dr. Karenann Cai. Procedure occurred without major complications and patient was transferred to neuro ICU in stable condition for observation of symptoms related to vasospasm. TCDs were obtained on Mondays, Wednesdays, and Fridays- all without evidence of vasospasm during this admission. Following procedure, Brilinta was discontinued and patient remained on Aspirin 81 mg once daily. PT/OT/SLP consulted who recommended OT and SLP follow-up on outpatient basis. He was also started on alcohol/tobacco withdraw protocol.  On 08/12/2019, patient was transferred from neurology to [facility] service. TRH was consulted to assist with medical management. On the night of 08/14/2019, patient was transferred to neurology progressive unit.  On 08/16/2019 at approximately 0500, patient developed increased speech difficulty. Code stroke was initiated and  CTA head revealed Small cortically based infarcts clustered in the posterior left temporal and low parietal cortex, new from brain MRI 9 days ago. Patient was restarted on Brilinta 90 mg twice daily. P2Y12 was obtained 08/18/2019- 1 PRU; patient was then switched to Brilinta 90 mg once daily.  On 08/19/2019, patient underwent  an image-guided cerebral arteriogram with staged embolization of ruptured left MCA bifurcation using Atlas stent placement via right femoral artery by Dr. Karenann Cai. Procedure occurred without major complications and patient was transferred to neuro ICU in stable condition for overnight observation. No major events occurred overnight. Patient awake and alert sitting in bed. Still with RUE numbness/tingling and hesitancy with speech- improved since admission. Right groin incision stable. P2Y12 126 PRU this AM- per Dr. Karenann Cai patient to continue taking Brilinta 90 mg once daily and Aspirin 81 mg once daily. Plan to discharge home today and follow-up with Dr. Karenann Cai in clinic 2 weeks after discharge.   Consults: neurology and rehabilitation medicine  Significant Diagnostic Studies: EEG  Result Date: 08/16/2019 Victor Havens, MD     08/16/2019 11:56 AM Patient Name: Victor Pena MRN: [id number] Epilepsy Attending: Lora Pena Referring Physician/Provider: Dr. 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  EEG evaluate for seizures. Level of alertness: Awake, asleep AEDs during EEG study: None Technical aspects: This EEG study was done with scalp electrodes positioned according to the 10-20 International system of electrode placement. Electrical activity was acquired at a sampling rate of 500Hz  and reviewed with a high  frequency filter of 70Hz  and a low frequency filter of 1Hz . EEG data were  recorded continuously and digitally stored. Description: The posterior dominant rhythm consists of 9-10 Hz activity of moderate voltage (25-35 uV) seen predominantly in posterior head regions, symmetric and reactive to eye opening and eye closing.  Sleep was characterized by vertex waves, sleep spindles (12 to 14 Hz), maximal frontocentral region.  Hyperventilation and photic stimulation were not performed.   IMPRESSION: This study is within normal limits. No seizures or epileptiform discharges were seen throughout the recording. Victor Pena   MR BRAIN WO CONTRAST  Result Date: 08/07/2019 CLINICAL DATA:  Initial evaluation for worsened speech, status post coil embolization of MCA aneurysm. EXAM: MRI HEAD WITHOUT CONTRAST TECHNIQUE: Multiplanar, multiecho pulse sequences of the brain and surrounding structures were obtained without intravenous contrast. COMPARISON:  Prior head CT from 08/06/2019. FINDINGS: Brain: Mild age-related cerebral atrophy. Scattered susceptibility artifact and T1 hyperintensity seen throughout the left sylvian fissure and overlying left frontoparietal sulci, consistent with subarachnoid hemorrhage, similar to previous CT. Scattered serpiginous diffusion signal abnormality seen throughout this region, favored in large part to be related to the underlying subarachnoid blood, although superimposed small volume ischemic changes could be present as well, difficult to exclude. There are a few scattered punctate foci of diffusion abnormality involving the cortical gray matter of the parasagittal left frontal lobe (series 5, image 93) and left parietal lobe (series 5, images 86, 87, likely reflecting punctate ischemic infarcts. No frank intraparenchymal hematoma or mass effect. No other diffusion abnormality to suggest acute or subacute ischemia. Gray-white matter differentiation otherwise maintained. No other areas of  remote cortical infarction. No other evidence for acute or chronic intracranial hemorrhage. No mass lesion or midline shift. No hydrocephalus or extra-axial collection. Midline structures intact. Pituitary gland suprasellar region normal. Vascular: Susceptibility artifact related to coiled left MCA bifurcation aneurysm. Major intracranial vascular flow voids maintained elsewhere. Skull and upper cervical spine: Craniocervical junction within normal limits. Upper cervical spine normal. Bone marrow signal intensity within normal limits. No scalp soft tissue abnormality. Sinuses/Orbits: Globes and orbital soft tissues within normal limits. Mild scattered mucosal thickening noted within the ethmoidal air cells. Paranasal sinuses are otherwise largely clear. Trace right mastoid effusion. Inner ear structures grossly normal. Other: None. IMPRESSION: 1. Scattered subarachnoid hemorrhage involving the left sylvian fissure and overlying left frontal parietal convexity, relatively stable from prior CT, and consistent with recently ruptured left MCA bifurcation aneurysm. 2. Few probable punctate ischemic nonhemorrhagic infarcts involving the left frontal and parietal lobes as above. Additional scattered serpiginous diffusion abnormality favored to in large part be related to the underlying subarachnoid hemorrhage. 3. Sequelae of interval coil embolization of left MCA bifurcation aneurysm. Electronically Signed   By: Jeannine Boga M.D.   On: 08/07/2019 02:21   IR Transcath/Emboliz  Result Date: 08/07/2019 INDICATION: 64 year old male presenting to the emergency with 1 day history of intermittent dysphasia and right hand numbness and weakness. Head CT showed a left sylvian subarachnoid hemorrhage (mFS 1). He was known to our service and had recently undergone a diagnostic cerebral angiogram that showed a 5 mm right ICA blister aneurysm with superimposed to the lobulation and a 4.5 mm left MCA bifurcation wide neck  aneurysm. 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 He had been started on dual anti-platelet therapy with aspirin and Brilinta 5 days ago in anticipation to schedule procedure on 08/07/2019. At admission, he presented mild expressive aphasia with no motor deficit. He was then taken to our service for emergency angiogram and left  MCA aneurysm treatment. EXAM: Ultrasound-guided vascular access Diagnostic cerebral angiogram 3D rotational angiogram Balloon assisted coil embolization of brain aneurysm Flat panel CT COMPARISON:  Diagnostic cerebral angiogram July 19, 2019 MEDICATIONS: No antibiotics utilized during the procedure. A total of 5000 units of heparin IV administered. ANESTHESIA/SEDATION: The procedure was performed under general anesthesia. CONTRAST:  140 cc of Omnipaque 240 FLUOROSCOPY TIME:  Fluoroscopy Time: 72 minutes 18 seconds (3059 mGy). COMPLICATIONS: None immediate. TECHNIQUE: Informed written consent was obtained from the patient after a thorough discussion of the procedural risks, benefits and alternatives. All questions were addressed. Maximal Sterile Barrier Technique was utilized including caps, mask, sterile gowns, sterile gloves, sterile drape, hand hygiene and skin antiseptic. A timeout was performed prior to the initiation of the procedure. Real-time ultrasound guidance was utilized for vascular access including the acquisition of a permanent ultrasound image documenting patency of the accessed vessel. Using a micropuncture kit and the modified Seldinger technique, access was gained to the right common femoral artery and an 8 French sheath was placed. Then, a 5 [place] DAV catheter was navigated over a 0.035 inch Terumo Glidewire into the aortic arch under fluoroscopy. The catheter was then placed into the left common carotid artery. Frontal and lateral road map was obtained. The catheter was then navigated into the left internal artery.  Frontal and lateral angiograms of the head were. 3D rotational angiograms were acquired and post processed in a separate workstation under concurrent attending physician supervision. Selected images were sent to PACS. Magnified frontal and lateral angiograms of the head in working projections were obtained. 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 PROCEDURE: The DAV catheter was exchanged over the wire and under biplane roadmap for a cerabase guide catheter which was placed in the cervical left ICA. A 6 [place] intermediate catheter was then navigated over the [employer] into the petrous segment of the left ICA. Magnified frontal and lateral angiograms of the head were obtained in the working projections. Using biplane roadmap, a headway duo microcatheter was navigated over a synchro 2 micro guidewire into the left MCA bifurcation aneurysm pouch. Attempted primary coiling proved unsuccessful due to wide neck. A 3 x 15 mm Transform compliant balloon was navigated over a synchro 14 micro guidewire in a parallel fashion through the [place] intermediate catheter into the left MCA M2 inferior division branch with the balloon at the level of the aneurysm neck. Then, a 4 mm x 6 cm target 360 soft framing coil was placed within the aneurysm pouch while the transform balloon was inflated. The balloon was then deflated. The coil mass remained stable within the aneurysm pouch. Control angiogram showed maintained patency of the daughter vessels. The coil was then detached. In a similar fashion, 3 filling and 2 finishing coils were placed within the aneurysm pouch (3 mm x 6 cm target 360 ultra, 3 mm x 4 cm target 360 ultra, 2.5 mm x 4 cm target 360 Nano, 1.5 mm by 4 cm target 360 nano and 1.5 mm x 4 cm target 360 nano). Control angiograms were obtained. The microcatheter was then removed followed by removal of the transform balloon. Follow-up magnified  frontal and lateral angiograms of the head in the working projections were obtained showing adequate aneurysm occlusion with good coil packing density and preservation of the parent artery and daughter arteries. Delayed magnified frontal and lateral angiograms of the head in the working projections were obtained showing stable  coil mass and no evidence of clot formation on the coil mass surface. Frontal and lateral angiograms with views of the entire head were obtained. No evidence of thromboembolic complication noted. The catheter construct was subsequently withdrawn. Flat panel CT of the head was obtained and post processed in a separate workstation with concurrent attending physician supervision. Selected images were sent to PACS. Stable appearance of known is left sided subarachnoid hemorrhage with no evidence of intra procedural bleed. A right common femoral artery angiogram with right anterior oblique view was obtained. Mild atherosclerotic changes of the right common femoral artery are noted, without stenosis. The access is at the level of the common femoral artery which has appropriate caliber for closure device utilization. The 8 French sheath was removed over the wire and a Perclose ProGlide closure device was used for access closure. Immediate hemostasis was achieved. 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 PLAN: 1. Patient transferred to ICU for anesthesia recovery and vasospasm watch. 2. Brilinta can be discontinued while remaining on aspirin 81 mg. Electronically Signed   By: Pedro Earls M.D.   On: 08/07/2019 13:43   IR Angiogram Follow Up Study  Result Date: 08/07/2019 INDICATION: 64 year old male presenting to the emergency with 1 day history of intermittent dysphasia and right hand numbness and weakness. Head CT showed a left sylvian subarachnoid hemorrhage (mFS 1). He was known to our  service and had recently undergone a diagnostic cerebral angiogram that showed a 5 mm right ICA blister aneurysm with superimposed to the lobulation and a 4.5 mm left MCA bifurcation wide neck aneurysm. 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 He had been started on dual anti-platelet therapy with aspirin and Brilinta 5 days ago in anticipation to schedule procedure on 08/07/2019. At admission, he presented mild expressive aphasia with no motor deficit. He was then taken to our service for emergency angiogram and left MCA aneurysm treatment. EXAM: Ultrasound-guided vascular access Diagnostic cerebral angiogram 3D rotational angiogram Balloon assisted coil embolization of brain aneurysm Flat panel CT COMPARISON:  Diagnostic cerebral angiogram July 19, 2019 MEDICATIONS: No antibiotics utilized during the procedure. A total of 5000 units of heparin IV administered. ANESTHESIA/SEDATION: The procedure was performed under general anesthesia. CONTRAST:  140 cc of Omnipaque 240 FLUOROSCOPY TIME:  Fluoroscopy Time: 72 minutes 18 seconds (3059 mGy). COMPLICATIONS: None immediate. TECHNIQUE: Informed written consent was obtained from the patient after a thorough discussion of the procedural risks, benefits and alternatives. All questions were addressed. Maximal Sterile Barrier Technique was utilized including caps, mask, sterile gowns, sterile gloves, sterile drape, hand hygiene and skin antiseptic. A timeout was performed prior to the initiation of the procedure. Real-time ultrasound guidance was utilized for vascular access including the acquisition of a permanent ultrasound image documenting patency of the accessed vessel. Using a micropuncture kit and the modified Seldinger technique, access was gained to the right common femoral artery and an 8 French sheath was placed. Then, a 5 [place] DAV catheter was navigated over a 0.035 inch Terumo Glidewire into the aortic arch  under fluoroscopy. The catheter was then placed into the left common carotid artery. Frontal and lateral road map was obtained. The catheter was then navigated into the left internal artery. Frontal and lateral angiograms of the head were. 3D rotational angiograms were acquired and post processed in a separate workstation under concurrent attending physician supervision. Selected images were sent  to PACS. Magnified frontal and lateral angiograms of the head in working projections were obtained. 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 PROCEDURE: The DAV catheter was exchanged over the wire and under biplane roadmap for a cerabase guide catheter which was placed in the cervical left ICA. A 6 [place] intermediate catheter was then navigated over the [employer] into the petrous segment of the left ICA. Magnified frontal and lateral angiograms of the head were obtained in the working projections. Using biplane roadmap, a headway duo microcatheter was navigated over a synchro 2 micro guidewire into the left MCA bifurcation aneurysm pouch. Attempted primary coiling proved unsuccessful due to wide neck. A 3 x 15 mm Transform compliant balloon was navigated over a synchro 14 micro guidewire in a parallel fashion through the [place] intermediate catheter into the left MCA M2 inferior division branch with the balloon at the level of the aneurysm neck. Then, a 4 mm x 6 cm target 360 soft framing coil was placed within the aneurysm pouch while the transform balloon was inflated. The balloon was then deflated. The coil mass remained stable within the aneurysm pouch. Control angiogram showed maintained patency of the daughter vessels. The coil was then detached. In a similar fashion, 3 filling and 2 finishing coils were placed within the aneurysm pouch (3 mm x 6 cm target 360 ultra, 3 mm x 4 cm target 360 ultra, 2.5 mm x 4 cm target 360 Nano, 1.5 mm by 4 cm  target 360 nano and 1.5 mm x 4 cm target 360 nano). Control angiograms were obtained. The microcatheter was then removed followed by removal of the transform balloon. Follow-up magnified frontal and lateral angiograms of the head in the working projections were obtained showing adequate aneurysm occlusion with good coil packing density and preservation of the parent artery and daughter arteries. Delayed magnified frontal and lateral angiograms of the head in the working projections were obtained showing stable coil mass and no evidence of clot formation on the coil mass surface. Frontal and lateral angiograms with views of the entire head were obtained. No evidence of thromboembolic complication noted. The catheter construct was subsequently withdrawn. Flat panel CT of the head was obtained and post processed in a separate workstation with concurrent attending physician supervision. Selected images were sent to PACS. Stable appearance of known is left sided subarachnoid hemorrhage with no evidence of intra procedural bleed. A right common femoral artery angiogram with right anterior oblique view was obtained. Mild atherosclerotic changes of the right common femoral artery are noted, without stenosis. The access is at the level of the common femoral artery which has appropriate caliber for closure device utilization. The 8 French sheath was removed over the wire and a Perclose ProGlide closure device was used for access closure. Immediate hemostasis was achieved. 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 PLAN: 1. Patient transferred to ICU for anesthesia recovery and vasospasm watch. 2. Brilinta can be discontinued while remaining on aspirin 81 mg. Electronically Signed   By: Pedro Earls M.D.   On: 08/07/2019 13:43   IR Angiogram Follow Up Study  Result Date: 08/07/2019 INDICATION: 64 year old male presenting to  the emergency with 1 day history of intermittent dysphasia and right hand numbness and weakness. Head CT showed a left sylvian subarachnoid hemorrhage (mFS 1). He was known to our service and had recently  undergone a diagnostic cerebral angiogram that showed a 5 mm right ICA blister aneurysm with superimposed to the lobulation and a 4.5 mm left MCA bifurcation wide neck aneurysm. 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 He had been started on dual anti-platelet therapy with aspirin and Brilinta 5 days ago in anticipation to schedule procedure on 08/07/2019. At admission, he presented mild expressive aphasia with no motor deficit. He was then taken to our service for emergency angiogram and left MCA aneurysm treatment. EXAM: Ultrasound-guided vascular access Diagnostic cerebral angiogram 3D rotational angiogram Balloon assisted coil embolization of brain aneurysm Flat panel CT COMPARISON:  Diagnostic cerebral angiogram July 19, 2019 MEDICATIONS: No antibiotics utilized during the procedure. A total of 5000 units of heparin IV administered. ANESTHESIA/SEDATION: The procedure was performed under general anesthesia. CONTRAST:  140 cc of Omnipaque 240 FLUOROSCOPY TIME:  Fluoroscopy Time: 72 minutes 18 seconds (3059 mGy). COMPLICATIONS: None immediate. TECHNIQUE: Informed written consent was obtained from the patient after a thorough discussion of the procedural risks, benefits and alternatives. All questions were addressed. Maximal Sterile Barrier Technique was utilized including caps, mask, sterile gowns, sterile gloves, sterile drape, hand hygiene and skin antiseptic. A timeout was performed prior to the initiation of the procedure. Real-time ultrasound guidance was utilized for vascular access including the acquisition of a permanent ultrasound image documenting patency of the accessed vessel. Using a micropuncture kit and the modified Seldinger technique, access  was gained to the right common femoral artery and an 8 French sheath was placed. Then, a 5 [place] DAV catheter was navigated over a 0.035 inch Terumo Glidewire into the aortic arch under fluoroscopy. The catheter was then placed into the left common carotid artery. Frontal and lateral road map was obtained. The catheter was then navigated into the left internal artery. Frontal and lateral angiograms of the head were. 3D rotational angiograms were acquired and post processed in a separate workstation under concurrent attending physician supervision. Selected images were sent to PACS. Magnified frontal and lateral angiograms of the head in working projections were obtained. 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 PROCEDURE: The DAV catheter was exchanged over the wire and under biplane roadmap for a cerabase guide catheter which was placed in the cervical left ICA. A 6 [place] intermediate catheter was then navigated over the [employer] into the petrous segment of the left ICA. Magnified frontal and lateral angiograms of the head were obtained in the working projections. Using biplane roadmap, a headway duo microcatheter was navigated over a synchro 2 micro guidewire into the left MCA bifurcation aneurysm pouch. Attempted primary coiling proved unsuccessful due to wide neck. A 3 x 15 mm Transform compliant balloon was navigated over a synchro 14 micro guidewire in a parallel fashion through the [place] intermediate catheter into the left MCA M2 inferior division branch with the balloon at the level of the aneurysm neck. Then, a 4 mm x 6 cm target 360 soft framing coil was placed within the aneurysm pouch while the transform balloon was inflated. The balloon was then deflated. The coil mass remained stable within the aneurysm pouch. Control angiogram showed maintained patency of the daughter vessels. The coil was then detached. In a similar  fashion, 3 filling and 2 finishing coils were placed within the aneurysm pouch (3 mm x 6 cm target 360 ultra, 3 mm x 4 cm target 360 ultra, 2.5 mm x  4 cm target 360 Nano, 1.5 mm by 4 cm target 360 nano and 1.5 mm x 4 cm target 360 nano). Control angiograms were obtained. The microcatheter was then removed followed by removal of the transform balloon. Follow-up magnified frontal and lateral angiograms of the head in the working projections were obtained showing adequate aneurysm occlusion with good coil packing density and preservation of the parent artery and daughter arteries. Delayed magnified frontal and lateral angiograms of the head in the working projections were obtained showing stable coil mass and no evidence of clot formation on the coil mass surface. Frontal and lateral angiograms with views of the entire head were obtained. No evidence of thromboembolic complication noted. The catheter construct was subsequently withdrawn. Flat panel CT of the head was obtained and post processed in a separate workstation with concurrent attending physician supervision. Selected images were sent to PACS. Stable appearance of known is left sided subarachnoid hemorrhage with no evidence of intra procedural bleed. A right common femoral artery angiogram with right anterior oblique view was obtained. Mild atherosclerotic changes of the right common femoral artery are noted, without stenosis. The access is at the level of the common femoral artery which has appropriate caliber for closure device utilization. The 8 French sheath was removed over the wire and a Perclose ProGlide closure device was used for access closure. Immediate hemostasis was achieved. 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 PLAN: 1. Patient transferred to ICU for anesthesia recovery and vasospasm watch. 2. Brilinta can be discontinued while remaining on aspirin 81 mg.  Electronically Signed   By: Pedro Earls M.D.   On: 08/07/2019 13:43   IR Angiogram Follow Up Study  Result Date: 08/07/2019 INDICATION: 64 year old male presenting to the emergency with 1 day history of intermittent dysphasia and right hand numbness and weakness. Head CT showed a left sylvian subarachnoid hemorrhage (mFS 1). 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 He had been started on dual anti-platelet therapy with aspirin and Brilinta 5 days ago in anticipation to schedule procedure on 08/07/2019. At admission, he presented mild expressive aphasia with no motor deficit. He was then taken to our service for emergency angiogram and left MCA aneurysm treatment. EXAM: Ultrasound-guided vascular access Diagnostic cerebral angiogram 3D rotational angiogram Balloon assisted coil embolization of brain aneurysm Flat panel CT COMPARISON:  Diagnostic cerebral angiogram July 19, 2019 MEDICATIONS: No antibiotics utilized during the procedure. A total of 5000 units of heparin IV administered. ANESTHESIA/SEDATION: The procedure was performed under general anesthesia. CONTRAST:  140 cc of Omnipaque 240 FLUOROSCOPY TIME:  Fluoroscopy Time: 72 minutes 18 seconds (3059 mGy). COMPLICATIONS: None immediate. TECHNIQUE: Informed written consent was obtained from the patient after a thorough discussion of the procedural risks, benefits and alternatives. All questions were addressed. Maximal Sterile Barrier Technique was utilized including caps, mask, sterile gowns, sterile gloves, sterile drape, hand hygiene and skin antiseptic. A timeout was performed prior to the initiation of the procedure. Real-time ultrasound guidance was utilized for  vascular access including the acquisition of a permanent ultrasound image documenting patency of the accessed vessel. Using a micropuncture kit and the modified Seldinger technique, access was gained to the  right common femoral artery and an 8 French sheath was placed. Then, a 5 [place] DAV catheter was navigated over a 0.035 inch Terumo Glidewire into the aortic arch under fluoroscopy. The catheter was then placed into the left common carotid artery. Frontal and lateral road map was obtained. The catheter was then navigated into the left internal artery. Frontal and lateral angiograms of the head were. 3D rotational angiograms were acquired and post processed in a separate workstation under concurrent attending physician supervision. Selected images were sent to PACS. Magnified frontal and lateral angiograms of the head in working projections were obtained. 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 PROCEDURE: The DAV catheter was exchanged over the wire and under biplane roadmap for a cerabase guide catheter which was placed in the cervical left ICA. A 6 [place] intermediate catheter was then navigated over the [employer] into the petrous segment of the left ICA. Magnified frontal and lateral angiograms of the head were obtained in the working projections. Using biplane roadmap, a headway duo microcatheter was navigated over a synchro 2 micro guidewire into the left MCA bifurcation aneurysm pouch. Attempted primary coiling proved unsuccessful due to wide neck. A 3 x 15 mm Transform compliant balloon was navigated over a synchro 14 micro guidewire in a parallel fashion through the [place] intermediate catheter into the left MCA M2 inferior division branch with the balloon at the level of the aneurysm neck. Then, a 4 mm x 6 cm target 360 soft framing coil was placed within the aneurysm pouch while the transform balloon was inflated. The balloon  was then deflated. The coil mass remained stable within the aneurysm pouch. Control angiogram showed maintained patency of the daughter vessels. The coil was then detached. In a similar fashion, 3 filling and 2 finishing coils were placed within the aneurysm pouch (3 mm x 6 cm target 360 ultra, 3 mm x 4 cm target 360 ultra, 2.5 mm x 4 cm target 360 Nano, 1.5 mm by 4 cm target 360 nano and 1.5 mm x 4 cm target 360 nano). Control angiograms were obtained. The microcatheter was then removed followed by removal of the transform balloon. Follow-up magnified frontal and lateral angiograms of the head in the working projections were obtained showing adequate aneurysm occlusion with good coil packing density and preservation of the parent artery and daughter arteries. Delayed magnified frontal and lateral angiograms of the head in the working projections were obtained showing stable coil mass and no evidence of clot formation on the coil mass surface. Frontal and lateral angiograms with views of the entire head were obtained. No evidence of thromboembolic complication noted. The catheter construct was subsequently withdrawn. Flat panel CT of the head was obtained and post processed in a separate workstation with concurrent attending physician supervision. Selected images were sent to PACS. Stable appearance of known is left sided subarachnoid hemorrhage with no evidence of intra procedural bleed. A right common femoral artery angiogram with right anterior oblique view was obtained. Mild atherosclerotic changes of the right common femoral artery are noted, without stenosis. The access is at the level of the common femoral artery which has appropriate caliber for closure device utilization. The 8 French sheath was removed over the wire and a Perclose ProGlide closure device was used for access closure. Immediate hemostasis was achieved. IMPRESSION: Successful and uncomplicated balloon assisted coiling of a ruptured 4.5 mm  left MCA bifurcation wide neck aneurysm. PLAN:  1. Patient transferred to ICU for anesthesia recovery and vasospasm watch. 2. Brilinta can be discontinued while remaining on aspirin 81 mg. Electronically Signed   By: Pedro Earls M.D.   On: 08/07/2019 13:43   IR Angiogram Follow Up Study  Result Date: 08/07/2019 INDICATION: 64 year old male presenting to the emergency with 1 day history of intermittent dysphasia and right hand numbness and weakness. Head CT showed a left sylvian subarachnoid hemorrhage (mFS 1). 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 He had been started on dual anti-platelet therapy with aspirin and Brilinta 5 days ago in anticipation to schedule procedure on 08/07/2019. At admission, he presented mild expressive aphasia with no motor deficit. He was then taken to our service for emergency angiogram and left MCA aneurysm treatment. EXAM: Ultrasound-guided vascular access Diagnostic cerebral angiogram 3D rotational angiogram Balloon assisted coil embolization of brain aneurysm Flat panel CT COMPARISON:  Diagnostic cerebral angiogram July 19, 2019 MEDICATIONS: No antibiotics utilized during the procedure. A total of 5000 units of heparin IV administered. ANESTHESIA/SEDATION: The procedure was performed under general anesthesia. CONTRAST:  140 cc of Omnipaque 240 FLUOROSCOPY TIME:  Fluoroscopy Time: 72 minutes 18 seconds (3059 mGy). COMPLICATIONS: None immediate. TECHNIQUE: Informed written consent was obtained from the patient after a thorough discussion of the procedural risks, benefits and alternatives. All questions were addressed. Maximal Sterile Barrier Technique was utilized including caps, mask, sterile  gowns, sterile gloves, sterile drape, hand hygiene and skin antiseptic. A timeout was performed prior to the initiation of the procedure. Real-time ultrasound guidance was utilized for vascular access including the acquisition of a permanent ultrasound image documenting patency of the accessed vessel. Using a micropuncture kit and the modified Seldinger technique, access was gained to the right common femoral artery and an 8 French sheath was placed. Then, a 5 [place] DAV catheter was navigated over a 0.035 inch Terumo Glidewire into the aortic arch under fluoroscopy. The catheter was then placed into the left common carotid artery. Frontal and lateral road map was obtained. The catheter was then navigated into the left internal artery. Frontal and lateral angiograms of the head were. 3D rotational angiograms were acquired and post processed in a separate workstation under concurrent attending physician supervision. Selected images were sent to PACS. Magnified frontal and lateral angiograms of the head in working projections were obtained. 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 PROCEDURE: The DAV catheter was exchanged over the wire and under biplane roadmap for a cerabase guide catheter which was placed in the cervical left ICA. A 6 [place] intermediate catheter was then navigated over the [employer] into the petrous segment of the left ICA. Magnified frontal and lateral angiograms of the head were obtained in the working projections. Using biplane roadmap, a headway duo microcatheter was navigated over a synchro 2 micro guidewire into the left MCA bifurcation aneurysm pouch. Attempted primary coiling proved unsuccessful due to wide neck. A 3 x 15 mm Transform compliant balloon was navigated over a synchro 14 micro guidewire in a parallel fashion through the [place] intermediate catheter into the left MCA M2 inferior division branch with the  balloon at the level of the aneurysm  neck. Then, a 4 mm x 6 cm target 360 soft framing coil was placed within the aneurysm pouch while the transform balloon was inflated. The balloon was then deflated. The coil mass remained stable within the aneurysm pouch. Control angiogram showed maintained patency of the daughter vessels. The coil was then detached. In a similar fashion, 3 filling and 2 finishing coils were placed within the aneurysm pouch (3 mm x 6 cm target 360 ultra, 3 mm x 4 cm target 360 ultra, 2.5 mm x 4 cm target 360 Nano, 1.5 mm by 4 cm target 360 nano and 1.5 mm x 4 cm target 360 nano). Control angiograms were obtained. The microcatheter was then removed followed by removal of the transform balloon. Follow-up magnified frontal and lateral angiograms of the head in the working projections were obtained showing adequate aneurysm occlusion with good coil packing density and preservation of the parent artery and daughter arteries. Delayed magnified frontal and lateral angiograms of the head in the working projections were obtained showing stable coil mass and no evidence of clot formation on the coil mass surface. Frontal and lateral angiograms with views of the entire head were obtained. No evidence of thromboembolic complication noted. The catheter construct was subsequently withdrawn. Flat panel CT of the head was obtained and post processed in a separate workstation with concurrent attending physician supervision. Selected images were sent to PACS. Stable appearance of known is left sided subarachnoid hemorrhage with no evidence of intra procedural bleed. A right common femoral artery angiogram with right anterior oblique view was obtained. Mild atherosclerotic changes of the right common femoral artery are noted, without stenosis. The access is at the level of the common femoral artery which has appropriate caliber for closure device utilization. The 8 French sheath was removed over the wire and a  Perclose ProGlide closure device was used for access closure. Immediate hemostasis was achieved. 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 PLAN: 1. Patient transferred to ICU for anesthesia recovery and vasospasm watch. 2. Brilinta can be discontinued while remaining on aspirin 81 mg. Electronically Signed   By: Pedro Earls M.D.   On: 08/07/2019 13:43   IR Angiogram Follow Up Study  Result Date: 08/07/2019 INDICATION: 65 year old male presenting to the emergency with 1 day history of intermittent dysphasia and right hand numbness and weakness. Head CT showed a left sylvian subarachnoid hemorrhage (mFS 1). 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 He had been started on dual anti-platelet therapy with aspirin and Brilinta 5 days ago in anticipation to schedule procedure on 08/07/2019. At admission, he presented mild expressive aphasia with no motor deficit. He was then taken to our service for emergency angiogram and left MCA aneurysm treatment. EXAM: Ultrasound-guided vascular access Diagnostic cerebral angiogram 3D rotational angiogram Balloon assisted coil embolization of brain aneurysm Flat panel CT COMPARISON:  Diagnostic cerebral angiogram July 19, 2019 MEDICATIONS: No antibiotics utilized during the procedure. A total of 5000 units of heparin IV administered. ANESTHESIA/SEDATION: The procedure was performed under general anesthesia. CONTRAST:  140 cc of Omnipaque 240 FLUOROSCOPY TIME:  Fluoroscopy Time: 72 minutes 18 seconds (3059 mGy). COMPLICATIONS: None immediate. TECHNIQUE: Informed written consent  was obtained from the patient  after a thorough discussion of the procedural risks, benefits and alternatives. All questions were addressed. Maximal Sterile Barrier Technique was utilized including caps, mask, sterile gowns, sterile gloves, sterile drape, hand hygiene and skin antiseptic. A timeout was performed prior to the initiation of the procedure. Real-time ultrasound guidance was utilized for vascular access including the acquisition of a permanent ultrasound image documenting patency of the accessed vessel. Using a micropuncture kit and the modified Seldinger technique, access was gained to the right common femoral artery and an 8 French sheath was placed. Then, a 5 [place] DAV catheter was navigated over a 0.035 inch Terumo Glidewire into the aortic arch under fluoroscopy. The catheter was then placed into the left common carotid artery. Frontal and lateral road map was obtained. The catheter was then navigated into the left internal artery. Frontal and lateral angiograms of the head were. 3D rotational angiograms were acquired and post processed in a separate workstation under concurrent attending physician supervision. Selected images were sent to PACS. Magnified frontal and lateral angiograms of the head in working projections were obtained. 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 PROCEDURE: The DAV catheter was exchanged over the wire and under biplane roadmap for a cerabase guide catheter which was placed in the cervical left ICA. A 6 [place] intermediate catheter was then navigated over the [employer] into the petrous segment of the left ICA. Magnified frontal and lateral angiograms of the head were obtained in the working projections. Using biplane roadmap, a headway duo microcatheter was navigated over a synchro 2 micro guidewire into the left MCA bifurcation aneurysm pouch. Attempted primary coiling proved unsuccessful due to wide neck. A 3 x 15 mm  Transform compliant balloon was navigated over a synchro 14 micro guidewire in a parallel fashion through the [place] intermediate catheter into the left MCA M2 inferior division branch with the balloon at the level of the aneurysm neck. Then, a 4 mm x 6 cm target 360 soft framing coil was placed within the aneurysm pouch while the transform balloon was inflated. The balloon was then deflated. The coil mass remained stable within the aneurysm pouch. Control angiogram showed maintained patency of the daughter vessels. The coil was then detached. In a similar fashion, 3 filling and 2 finishing coils were placed within the aneurysm pouch (3 mm x 6 cm target 360 ultra, 3 mm x 4 cm target 360 ultra, 2.5 mm x 4 cm target 360 Nano, 1.5 mm by 4 cm target 360 nano and 1.5 mm x 4 cm target 360 nano). Control angiograms were obtained. The microcatheter was then removed followed by removal of the transform balloon. Follow-up magnified frontal and lateral angiograms of the head in the working projections were obtained showing adequate aneurysm occlusion with good coil packing density and preservation of the parent artery and daughter arteries. Delayed magnified frontal and lateral angiograms of the head in the working projections were obtained showing stable coil mass and no evidence of clot formation on the coil mass surface. Frontal and lateral angiograms with views of the entire head were obtained. No evidence of thromboembolic complication noted. The catheter construct was subsequently withdrawn. Flat panel CT of the head was obtained and post processed in a separate workstation with concurrent attending physician supervision. Selected images were sent to PACS. Stable appearance of known is left sided subarachnoid hemorrhage with no evidence of intra procedural bleed. A  right common femoral artery angiogram with right anterior oblique view was obtained. Mild atherosclerotic changes of the right common femoral artery are noted,  without stenosis. The access is at the level of the common femoral artery which has appropriate caliber for closure device utilization. The 8 French sheath was removed over the wire and a Perclose ProGlide closure device was used for access closure. Immediate hemostasis was achieved. 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 PLAN: 1. Patient transferred to ICU for anesthesia recovery and vasospasm watch. 2. Brilinta can be discontinued while remaining on aspirin 81 mg. Electronically Signed   By: Pedro Earls M.D.   On: 08/07/2019 13:43   IR Angiogram Follow Up Study  Result Date: 08/07/2019 INDICATION: 64 year old male presenting to the emergency with 1 day history of intermittent dysphasia and right hand numbness and weakness. Head CT showed a left sylvian subarachnoid hemorrhage (mFS 1). 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 He had been started on dual anti-platelet therapy with aspirin and Brilinta 5 days ago in anticipation to schedule procedure on 08/07/2019. At admission, he presented mild expressive aphasia with no motor deficit. He was then taken to our service for emergency angiogram and left MCA aneurysm treatment. EXAM: Ultrasound-guided vascular access Diagnostic cerebral angiogram 3D rotational angiogram Balloon assisted coil embolization of brain aneurysm Flat panel CT COMPARISON:  Diagnostic cerebral angiogram July 19, 2019 MEDICATIONS: No antibiotics utilized during the procedure. A total of 5000 units of heparin IV administered. ANESTHESIA/SEDATION: The procedure was performed under general anesthesia. CONTRAST:  140 cc  of Omnipaque 240 FLUOROSCOPY TIME:  Fluoroscopy Time: 72 minutes 18 seconds (3059 mGy). COMPLICATIONS: None immediate. TECHNIQUE: Informed written consent was obtained from the patient after a thorough discussion of the procedural risks, benefits and alternatives. All questions were addressed. Maximal Sterile Barrier Technique was utilized including caps, mask, sterile gowns, sterile gloves, sterile drape, hand hygiene and skin antiseptic. A timeout was performed prior to the initiation of the procedure. Real-time ultrasound guidance was utilized for vascular access including the acquisition of a permanent ultrasound image documenting patency of the accessed vessel. Using a micropuncture kit and the modified Seldinger technique, access was gained to the right common femoral artery and an 8 French sheath was placed. Then, a 5 [place] DAV catheter was navigated over a 0.035 inch Terumo Glidewire into the aortic arch under fluoroscopy. The catheter was then placed into the left common carotid artery. Frontal and lateral road map was obtained. The catheter was then navigated into the left internal artery. Frontal and lateral angiograms of the head were. 3D rotational angiograms were acquired and post processed in a separate workstation under concurrent attending physician supervision. Selected images were sent to PACS. Magnified frontal and lateral angiograms of the head in working projections were obtained. 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 PROCEDURE: The DAV catheter was exchanged over the wire and under biplane roadmap for a cerabase guide catheter which was placed in the cervical left ICA. A 6 [place] intermediate catheter was then navigated over the [employer] into the petrous segment of the left ICA.  Magnified frontal and lateral angiograms of the head were obtained in the working projections. Using biplane roadmap, a headway duo  microcatheter was navigated over a synchro 2 micro guidewire into the left MCA bifurcation aneurysm pouch. Attempted primary coiling proved unsuccessful due to wide neck. A 3 x 15 mm Transform compliant balloon was navigated over a synchro 14 micro guidewire in a parallel fashion through the [place] intermediate catheter into the left MCA M2 inferior division branch with the balloon at the level of the aneurysm neck. Then, a 4 mm x 6 cm target 360 soft framing coil was placed within the aneurysm pouch while the transform balloon was inflated. The balloon was then deflated. The coil mass remained stable within the aneurysm pouch. Control angiogram showed maintained patency of the daughter vessels. The coil was then detached. In a similar fashion, 3 filling and 2 finishing coils were placed within the aneurysm pouch (3 mm x 6 cm target 360 ultra, 3 mm x 4 cm target 360 ultra, 2.5 mm x 4 cm target 360 Nano, 1.5 mm by 4 cm target 360 nano and 1.5 mm x 4 cm target 360 nano). Control angiograms were obtained. The microcatheter was then removed followed by removal of the transform balloon. Follow-up magnified frontal and lateral angiograms of the head in the working projections were obtained showing adequate aneurysm occlusion with good coil packing density and preservation of the parent artery and daughter arteries. Delayed magnified frontal and lateral angiograms of the head in the working projections were obtained showing stable coil mass and no evidence of clot formation on the coil mass surface. Frontal and lateral angiograms with views of the entire head were obtained. No evidence of thromboembolic complication noted. The catheter construct was subsequently withdrawn. Flat panel CT of the head was obtained and post processed in a separate workstation with concurrent attending physician supervision. Selected images were sent to PACS. Stable appearance of known is left sided subarachnoid hemorrhage with no evidence of  intra procedural bleed. A right common femoral artery angiogram with right anterior oblique view was obtained. Mild atherosclerotic changes of the right common femoral artery are noted, without stenosis. The access is at the level of the common femoral artery which has appropriate caliber for closure device utilization. The 8 French sheath was removed over the wire and a Perclose ProGlide closure device was used for access closure. Immediate hemostasis was achieved. 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 PLAN: 1. Patient transferred to ICU for anesthesia recovery and vasospasm watch. 2. Brilinta can be discontinued while remaining on aspirin 81 mg. Electronically Signed   By: Pedro Earls M.D.   On: 08/07/2019 13:43   IR 3D Independent Darreld Mclean  Result Date: 08/07/2019 INDICATION: 64 year old male presenting to the emergency with 1 day history of intermittent dysphasia and right hand numbness and weakness. Head CT showed a left sylvian subarachnoid hemorrhage (mFS 1). 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 He had been started on dual anti-platelet therapy with aspirin and Brilinta 5 days ago in anticipation to schedule procedure on 08/07/2019. At admission, he presented mild expressive aphasia with no motor deficit. He was then taken to  our service for emergency angiogram and left MCA aneurysm treatment. EXAM: Ultrasound-guided vascular access Diagnostic cerebral angiogram 3D rotational angiogram Balloon assisted coil embolization of brain aneurysm Flat panel CT COMPARISON:  Diagnostic cerebral angiogram July 19, 2019 MEDICATIONS: No  antibiotics utilized during the procedure. A total of 5000 units of heparin IV administered. ANESTHESIA/SEDATION: The procedure was performed under general anesthesia. CONTRAST:  140 cc of Omnipaque 240 FLUOROSCOPY TIME:  Fluoroscopy Time: 72 minutes 18 seconds (3059 mGy). COMPLICATIONS: None immediate. TECHNIQUE: Informed written consent was obtained from the patient after a thorough discussion of the procedural risks, benefits and alternatives. All questions were addressed. Maximal Sterile Barrier Technique was utilized including caps, mask, sterile gowns, sterile gloves, sterile drape, hand hygiene and skin antiseptic. A timeout was performed prior to the initiation of the procedure. Real-time ultrasound guidance was utilized for vascular access including the acquisition of a permanent ultrasound image documenting patency of the accessed vessel. Using a micropuncture kit and the modified Seldinger technique, access was gained to the right common femoral artery and an 8 French sheath was placed. Then, a 5 [place] DAV catheter was navigated over a 0.035 inch Terumo Glidewire into the aortic arch under fluoroscopy. The catheter was then placed into the left common carotid artery. Frontal and lateral road map was obtained. The catheter was then navigated into the left internal artery. Frontal and lateral angiograms of the head were. 3D rotational angiograms were acquired and post processed in a separate workstation under concurrent attending physician supervision. Selected images were sent to PACS. Magnified frontal and lateral angiograms of the head in working projections were obtained. 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 PROCEDURE: The DAV catheter was exchanged over the wire and under biplane roadmap for a cerabase guide catheter which was placed in the cervical left ICA. A 6 [place] intermediate catheter was then navigated over the [employer] [place] into the petrous segment of the left ICA. Magnified frontal and lateral angiograms of the head were obtained in the working projections. Using biplane roadmap, a headway duo microcatheter was navigated over a synchro 2 micro guidewire into the left MCA bifurcation aneurysm pouch. Attempted primary coiling proved unsuccessful due to wide neck. A 3 x 15 mm Transform compliant balloon was navigated over a synchro 14 micro guidewire in a parallel fashion through the [place] intermediate catheter into the left MCA M2 inferior division branch with the balloon at the level of the aneurysm neck. Then, a 4 mm x 6 cm target 360 soft framing coil was placed within the aneurysm pouch while the transform balloon was inflated. The balloon was then deflated. The coil mass remained stable within the aneurysm pouch. Control angiogram showed maintained patency of the daughter vessels. The coil was then detached. In a similar fashion, 3 filling and 2 finishing coils were placed within the aneurysm pouch (3 mm x 6 cm target 360 ultra, 3 mm x 4 cm target 360 ultra, 2.5 mm x 4 cm target 360 Nano, 1.5 mm by 4 cm target 360 nano and 1.5 mm x 4 cm target 360 nano). Control angiograms were obtained. The microcatheter was then removed followed by removal of the transform balloon. Follow-up magnified frontal and lateral angiograms of the head in the working projections were obtained showing adequate aneurysm occlusion with good coil packing density and preservation of the parent artery and daughter arteries. Delayed magnified frontal and lateral angiograms of the head in  the working projections were obtained showing stable coil mass and no evidence of clot formation on the coil mass surface. Frontal and lateral angiograms with views of the entire head were obtained. No evidence of thromboembolic complication noted. The catheter construct was subsequently withdrawn. Flat panel CT of the head was obtained and post processed in a separate  workstation with concurrent attending physician supervision. Selected images were sent to PACS. Stable appearance of known is left sided subarachnoid hemorrhage with no evidence of intra procedural bleed. A right common femoral artery angiogram with right anterior oblique view was obtained. Mild atherosclerotic changes of the right common femoral artery are noted, without stenosis. The access is at the level of the common femoral artery which has appropriate caliber for closure device utilization. The 8 French sheath was removed over the wire and a Perclose ProGlide closure device was used for access closure. Immediate hemostasis was achieved. 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 PLAN: 1. Patient transferred to ICU for anesthesia recovery and vasospasm watch. 2. Brilinta can be discontinued while remaining on aspirin 81 mg. Electronically Signed   By: Pedro Earls M.D.   On: 08/07/2019 13:43   IR CT Head Ltd  Result Date: 08/07/2019 INDICATION: 64 year old male presenting to the emergency with 1 day history of intermittent dysphasia and right hand numbness and weakness. Head CT showed a left sylvian subarachnoid hemorrhage (mFS 1). 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 He had been started on dual anti-platelet therapy with aspirin and Brilinta 5 days ago in anticipation to schedule procedure on 08/07/2019. At admission, he presented mild expressive aphasia with no motor deficit. He was then taken to our service for emergency angiogram and left MCA aneurysm treatment. EXAM: Ultrasound-guided vascular access Diagnostic cerebral  angiogram 3D rotational angiogram Balloon assisted coil embolization of brain aneurysm Flat panel CT COMPARISON:  Diagnostic cerebral angiogram July 19, 2019 MEDICATIONS: No antibiotics utilized during the procedure. A total of 5000 units of heparin IV administered. ANESTHESIA/SEDATION: The procedure was performed under general anesthesia. CONTRAST:  140 cc of Omnipaque 240 FLUOROSCOPY TIME:  Fluoroscopy Time: 72 minutes 18 seconds (3059 mGy). COMPLICATIONS: None immediate. TECHNIQUE: Informed written consent was obtained from the patient after a thorough discussion of the procedural risks, benefits and alternatives. All questions were addressed. Maximal Sterile Barrier Technique was utilized including caps, mask, sterile gowns, sterile gloves, sterile drape, hand hygiene and skin antiseptic. A timeout was performed prior to the initiation of the procedure. Real-time ultrasound guidance was utilized for vascular access including the acquisition of a permanent ultrasound image documenting patency of the accessed vessel. Using a micropuncture kit and the modified Seldinger technique, access was gained to the right common femoral artery and an 8 French sheath was placed. Then, a 5 [place] DAV catheter was navigated over a 0.035 inch Terumo Glidewire into the aortic arch under fluoroscopy. The catheter was then placed into the left common carotid artery. Frontal and lateral road map was obtained. The catheter was then navigated into the left internal artery. Frontal and lateral angiograms of the head were. 3D rotational angiograms were acquired and post processed in a separate workstation under concurrent attending  physician supervision. Selected images were sent to PACS. Magnified frontal and lateral angiograms of the head in working projections were obtained. 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 PROCEDURE: The DAV catheter was exchanged over the wire  and under biplane roadmap for a cerabase guide catheter which was placed in the cervical left ICA. A 6 [place] intermediate catheter was then navigated over the [employer] into the petrous segment of the left ICA. Magnified frontal and lateral angiograms of the head were obtained in the working projections. Using biplane roadmap, a headway duo microcatheter was navigated over a synchro 2 micro guidewire into the left MCA bifurcation aneurysm pouch. Attempted primary coiling proved unsuccessful due to wide neck. A 3 x 15 mm Transform compliant balloon was navigated over a synchro 14 micro guidewire in a parallel fashion through the [place] intermediate catheter into the left MCA M2 inferior division branch with the balloon at the level of the aneurysm neck. Then, a 4 mm x 6 cm target 360 soft framing coil was placed within the aneurysm pouch while the transform balloon was inflated. The balloon was then deflated. The coil mass remained stable within the aneurysm pouch. Control angiogram showed maintained patency of the daughter vessels. The coil was then detached. In a similar fashion, 3 filling and 2 finishing coils were placed within the aneurysm pouch (3 mm x 6 cm target 360 ultra, 3 mm x 4 cm target 360 ultra, 2.5 mm x 4 cm target 360 Nano, 1.5 mm by 4 cm target 360 nano and 1.5 mm x 4 cm target 360 nano). Control angiograms were obtained. The microcatheter was then removed followed by removal of the transform balloon. Follow-up magnified frontal and lateral angiograms of the head in the working projections were obtained showing adequate aneurysm occlusion with good coil packing density and preservation of the parent artery and daughter arteries. Delayed magnified frontal and lateral angiograms of the head in the working projections were obtained showing stable coil mass and no evidence of clot formation on the coil mass surface. Frontal and lateral angiograms with views of the entire head were obtained.  No evidence of thromboembolic complication noted. The catheter construct was subsequently withdrawn. Flat panel CT of the head was obtained and post processed in a separate workstation with concurrent attending physician supervision. Selected images were sent to PACS. Stable appearance of known is left sided subarachnoid hemorrhage with no evidence of intra procedural bleed. A right common femoral artery angiogram with right anterior oblique view was obtained. Mild atherosclerotic changes of the right common femoral artery are noted, without stenosis. The access is at the level of the common femoral artery which has appropriate caliber for closure device utilization. The 8 French sheath was removed over the wire and a Perclose ProGlide closure device was used for access closure. Immediate hemostasis was achieved. 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 PLAN: 1. Patient transferred to ICU for anesthesia recovery and vasospasm watch. 2. Brilinta can be discontinued while remaining on aspirin 81 mg. Electronically Signed   By: Pedro Earls M.D.   On: 08/07/2019 13:43   IR US Guide Vasc Access Right  Result Date: 08/07/2019 INDICATION: 64 year old male presenting to the emergency with 1 day history of intermittent dysphasia and right hand numbness and weakness. Head CT showed a left sylvian subarachnoid hemorrhage (mFS 1). 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 He had been started on dual anti-platelet therapy with aspirin and Brilinta 5 days ago in anticipation to schedule procedure on 08/07/2019. At admission, he presented mild expressive  aphasia with no motor deficit. He was then taken to our service for emergency angiogram and left MCA aneurysm treatment. EXAM: Ultrasound-guided vascular access Diagnostic cerebral angiogram 3D rotational angiogram Balloon assisted coil embolization of brain aneurysm Flat panel CT COMPARISON:  Diagnostic cerebral angiogram July 19, 2019 MEDICATIONS: No antibiotics utilized during the procedure. A total of 5000 units of heparin IV administered. ANESTHESIA/SEDATION: The procedure was performed under general anesthesia. CONTRAST:  140 cc of Omnipaque 240 FLUOROSCOPY TIME:  Fluoroscopy Time: 72 minutes 18 seconds (3059 mGy). COMPLICATIONS: None immediate. TECHNIQUE: Informed written consent was obtained from the patient after a thorough discussion of the procedural risks, benefits and alternatives. All questions were addressed. Maximal Sterile Barrier Technique was utilized including caps, mask, sterile gowns, sterile gloves, sterile drape, hand hygiene and skin antiseptic. A timeout was performed prior to the initiation of the procedure. Real-time ultrasound guidance was utilized for vascular access including the acquisition of a permanent ultrasound image documenting patency of the accessed vessel. Using a micropuncture kit and the modified Seldinger technique, access was gained to the right common femoral artery and an 8 French sheath was placed. Then, a 5 [place] DAV catheter was navigated over a 0.035 inch Terumo Glidewire into the aortic arch under fluoroscopy. The catheter was then placed into the left common carotid artery. Frontal and lateral road map was obtained. The catheter was then navigated into the left internal artery. Frontal and lateral angiograms of the head were. 3D rotational angiograms were acquired and post processed in a separate workstation under concurrent attending physician supervision. Selected images were sent to PACS. Magnified frontal and lateral angiograms of the head in working  projections were obtained. 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 PROCEDURE: The DAV catheter was exchanged over the wire and under biplane roadmap for a cerabase guide catheter which was placed in the cervical left ICA. A 6 [place] intermediate catheter was then navigated over the [employer] into the petrous segment of the left ICA. Magnified frontal and lateral angiograms of the head were obtained in the working projections. Using biplane roadmap, a headway duo microcatheter was navigated over a synchro 2 micro guidewire into the left MCA bifurcation aneurysm pouch. Attempted primary coiling proved unsuccessful due to wide neck. A 3 x 15 mm Transform compliant balloon was navigated over a synchro 14 micro guidewire in a parallel fashion through the [place] intermediate catheter into the left MCA M2 inferior division branch with the balloon at the level of the aneurysm neck. Then, a 4 mm x 6 cm target 360 soft framing coil was placed within the aneurysm pouch while the transform balloon was inflated. The balloon was then deflated. The coil mass remained stable within the aneurysm pouch. Control angiogram showed maintained patency of the daughter vessels. The coil was then detached. In a similar fashion, 3 filling and 2 finishing coils were placed within the aneurysm pouch (3 mm x 6 cm target 360 ultra, 3 mm x 4  cm target 360 ultra, 2.5 mm x 4 cm target 360 Nano, 1.5 mm by 4 cm target 360 nano and 1.5 mm x 4 cm target 360 nano). Control angiograms were obtained. The microcatheter was then removed followed by removal of the transform balloon. Follow-up magnified frontal and lateral angiograms of the head in the working projections were obtained showing adequate aneurysm occlusion with good coil packing density and preservation of the parent artery and daughter arteries. Delayed magnified frontal and lateral angiograms of the head in the  working projections were obtained showing stable coil mass and no evidence of clot formation on the coil mass surface. Frontal and lateral angiograms with views of the entire head were obtained. No evidence of thromboembolic complication noted. The catheter construct was subsequently withdrawn. Flat panel CT of the head was obtained and post processed in a separate workstation with concurrent attending physician supervision. Selected images were sent to PACS. Stable appearance of known is left sided subarachnoid hemorrhage with no evidence of intra procedural bleed. A right common femoral artery angiogram with right anterior oblique view was obtained. Mild atherosclerotic changes of the right common femoral artery are noted, without stenosis. The access is at the level of the common femoral artery which has appropriate caliber for closure device utilization. The 8 French sheath was removed over the wire and a Perclose ProGlide closure device was used for access closure. Immediate hemostasis was achieved. 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 PLAN: 1. Patient transferred to ICU for anesthesia recovery and vasospasm watch. 2. Brilinta can be discontinued while remaining on aspirin 81 mg. Electronically Signed   By: Pedro Earls M.D.   On: 08/07/2019 13:43   Chest Port 1 View  Result Date: 08/06/2019 CLINICAL DATA:  64 year old male code stroke presentation with multiple intracranial aneurysms on MRA/conventional cerebral angiogram and presenting with aneurysmal type subarachnoid hemorrhage today. EXAM: PORTABLE CHEST 1 VIEW COMPARISON:  None. FINDINGS: Portable AP upright view at 1320 hours. Lung volumes and mediastinal contours are within normal limits. Visualized tracheal air column is within normal limits. Allowing for portable technique the lungs are clear. No pneumothorax. No acute osseous abnormality  identified. IMPRESSION: No acute cardiopulmonary abnormality. Electronically Signed   By: Genevie Ann M.D.   On: 08/06/2019 13:28   EEG adult  Result Date: 08/07/2019 Victor Havens, MD     08/07/2019  2:03 PM Patient Name: Victor Pena MRN: [id number] Epilepsy Attending: Lora Pena Referring Physician/Provider: Dr. Rosalin Hawking Date: 08/07/2019 Duration: 24.20 minutes Patient history: 64 year old male with left frontoparietal aneurysmal subarachnoid hemorrhage status post coiling.  EEG evaluate for seizures. Level of alertness: Awake, drowsy AEDs during EEG study: None Technical aspects: This EEG study was done with scalp electrodes positioned according to the 10-20 International system of electrode placement. Electrical activity was acquired at a sampling rate of 500Hz  and reviewed with a high frequency filter of 70Hz  and a low frequency filter of 1Hz . EEG data were recorded continuously and digitally stored. Description: The posterior dominant rhythm consists of 9 Hz activity of moderate voltage (25-35 uV) seen predominantly in posterior head regions, symmetric and reactive to eye opening and eye closing.  Drowsiness was characterized by attenuation of the posterior background rhythm and roving eye movements. Hyperventilation and photic stimulation were not performed. IMPRESSION: This study is within normal limits. No seizures or epileptiform discharges were seen throughout the recording. Priyanka Barbra Sarks  ECHOCARDIOGRAM COMPLETE  Result Date: 08/07/2019    ECHOCARDIOGRAM REPORT   Patient Name:   Victor Pena Date of Exam: 08/07/2019 Medical Rec #:  [id number]      Height:       71.0 in Accession #:    [id number]     Weight:       190.0 lb Date of Birth:  Jul 11, 1955      BSA:          2.063 m Patient Age:    19 years       BP:           109/65 mmHg Patient Gender: M              HR:           79 bpm. Exam Location:  Inpatient Procedure: 2D Echo Indications:    Stroke I163.9  History:        Patient has no  prior history of Echocardiogram examinations.  Sonographer:    Mikki Santee RDCS (AE) Referring Phys: [id number] [place]  1. Left ventricular ejection fraction, by estimation, is 60 to 65%. The left ventricle has normal function. The left ventricle has no regional wall motion abnormalities. Left ventricular diastolic parameters are consistent with Grade II diastolic dysfunction (pseudonormalization).  2. Right ventricular systolic function is normal. The right ventricular size is normal.  3. The mitral valve is normal in structure. No evidence of mitral valve regurgitation. No evidence of mitral stenosis.  4. The aortic valve was not well visualized. Aortic valve regurgitation is not visualized. No aortic stenosis is present.  5. Aortic dilatation noted. 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  6. The inferior vena cava is normal in size with greater than 50% respiratory variability, suggesting right atrial pressure of 3 mmHg. Conclusion(s)/Recommendation(s): No intracardiac source of embolism detected on this transthoracic study. A transesophageal echocardiogram is recommended to exclude cardiac source of embolism if clinically indicated. FINDINGS  Left Ventricle: Left ventricular ejection fraction, by estimation, is 60 to 65%. The left ventricle has normal function. The left ventricle has no regional wall motion abnormalities. The left ventricular internal cavity size was normal in size. There is  no left ventricular hypertrophy. Left ventricular diastolic parameters are consistent with Grade II diastolic dysfunction (pseudonormalization). Right Ventricle: The right ventricular size is normal. No increase in right ventricular wall thickness. Right ventricular systolic function is normal. Left Atrium: Left atrial size was normal in size. Right Atrium: Right atrial size was normal in size. Pericardium: There is no evidence of pericardial effusion. Mitral Valve: The mitral  valve is normal in structure. Normal mobility of the mitral valve leaflets. No evidence of mitral valve regurgitation. No evidence of mitral valve stenosis. Tricuspid Valve: The tricuspid valve is normal in structure. Tricuspid valve regurgitation is not demonstrated. No evidence of tricuspid stenosis. Aortic Valve: The aortic valve was not well visualized. Aortic valve regurgitation is not visualized. No aortic stenosis is present. Pulmonic Valve: The pulmonic valve was normal in structure. Pulmonic valve regurgitation is not visualized. No evidence of pulmonic stenosis. Aorta: Aortic dilatation noted. 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 Venous: The inferior vena cava is normal in size with greater than 50% respiratory variability, suggesting right atrial pressure of 3 mmHg. IAS/Shunts: No atrial level shunt detected by color flow Doppler.  LEFT VENTRICLE PLAX 2D LVIDd:         4.50 cm  Diastology LVIDs:         3.00 cm  LV e' lateral:   9.57 cm/s LV PW:         1.00 cm  LV E/e' lateral: 6.7 LV IVS:        1.00 cm  LV e' medial:    9.36 cm/s LVOT diam:     2.70 cm  LV E/e' medial:  6.8 LV SV:         102 LV SV Index:   49 LVOT Area:     5.73 cm  RIGHT VENTRICLE RV S prime:     15.20 cm/s TAPSE (M-mode): 1.7 cm LEFT ATRIUM             Index       RIGHT ATRIUM           Index LA diam:        3.20 cm 1.55 cm/m  RA Area:     14.30 cm LA Vol (A2C):   53.6 ml 25.98 ml/m RA Volume:   34.90 ml  16.92 ml/m LA Vol (A4C):   43.6 ml 21.13 ml/m LA Biplane Vol: 49.6 ml 24.04 ml/m  AORTIC VALVE LVOT Vmax:   87.50 cm/s LVOT Vmean:  58.100 cm/s LVOT VTI:    0.178 m  AORTA Ao Root diam: 3.80 cm MITRAL VALVE MV Area (PHT): 2.52 cm    SHUNTS MV Decel Time: 301 msec    Systemic VTI:  0.18 m MV E velocity: 63.80 cm/s  Systemic Diam: 2.70 cm MV A velocity: 57.00 cm/s MV E/A ratio:  1.12 Candee Furbish MD Electronically signed by Candee Furbish MD Signature Date/Time: 08/07/2019/11:13:11 AM    Final    CT  HEAD CODE STROKE WO CONTRAST  Addendum Date: 08/06/2019   ADDENDUM REPORT: 08/06/2019 12:51 ADDENDUM: These results were called by telephone at the time of interpretation on 08/06/2019 at 12:51 pm to provider Dr. Cheral Marker, who verbally acknowledged these results. Electronically Signed   By: Kellie Simmering DO   On: 08/06/2019 12:51   Result Date: 08/06/2019 CLINICAL DATA:  Code stroke. Neuro deficit, acute, stroke suspected. Difficulty speaking. EXAM: CT HEAD WITHOUT CONTRAST TECHNIQUE: Contiguous axial images were obtained from the base of the skull through the vertex without intravenous contrast. COMPARISON:  MRI/MRA head 07/10/2019. FINDINGS: Brain: There is moderate volume acute subareolar hemorrhage greatest along the left frontal lobe convexity and within the left sylvian fissure. No acute demarcated cortical infarct is identified. No extra-axial fluid collection. No evidence of intracranial mass. No midline shift. Vascular: No hyperdense vessel. Skull: Normal. Negative for fracture or focal lesion. Sinuses/Orbits: Visualized orbits show no acute finding. Mild ethmoid sinus mucosal thickening. No significant mastoid effusion. IMPRESSION: Moderate volume acute subarachnoid hemorrhage greatest along the left frontal lobe convexity and within the left sylvian fissure. 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 No hydrocephalus. No acute demarcated cortical infarct. Electronically Signed: By: Kellie Simmering DO On: 08/06/2019 12:39   VAS [place] TRANSCRANIAL DOPPLER  Result Date: 08/16/2019  Transcranial Doppler Indications: Subarachnoid hemorrhage. Comparison Study: 08/14/19 previous Performing Technologist: Abram Sander RVS  Examination Guidelines: A complete evaluation includes B-mode imaging, spectral Doppler, color Doppler, and power Doppler as needed of all accessible portions of each  vessel. Bilateral testing is considered an integral part of a complete examination. Limited examinations for reoccurring indications may be performed as noted.  +----------+-------------+----------+-----------+-------+  RIGHT TCD Right VM (cm)Depth (cm)PulsatilityComment +----------+-------------+----------+-----------+-------+ MCA           71.00                 1.12            +----------+-------------+----------+-----------+-------+ ACA          -38.00                 1.35            +----------+-------------+----------+-----------+-------+ Term ICA      25.00                 1.16            +----------+-------------+----------+-----------+-------+ PCA           20.00                 1.43            +----------+-------------+----------+-----------+-------+ Opthalmic     23.00                 1.67            +----------+-------------+----------+-----------+-------+ ICA siphon    42.00                 1.42            +----------+-------------+----------+-----------+-------+ Vertebral    -14.00                 1.05            +----------+-------------+----------+-----------+-------+  +----------+------------+----------+-----------+-------+ LEFT TCD  Left VM (cm)Depth (cm)PulsatilityComment +----------+------------+----------+-----------+-------+ MCA          63.00                 1.14            +----------+------------+----------+-----------+-------+ ACA          -46.00                1.00            +----------+------------+----------+-----------+-------+ Term ICA     43.00                 1.03            +----------+------------+----------+-----------+-------+ PCA          39.00                 1.36            +----------+------------+----------+-----------+-------+ Opthalmic    27.00                 1.39            +----------+------------+----------+-----------+-------+ ICA siphon   43.00                 1.40             +----------+------------+----------+-----------+-------+ Vertebral    -18.00                1.33            +----------+------------+----------+-----------+-------+  +------------+-------+-------+             VM cm/sComment +------------+-------+-------+ Prox Basilar-19.00         +------------+-------+-------+ Summary: This was a normal transcranial Doppler study, with normal flow direction and velocity of all identified vessels of the anterior and posterior circulations, with no evidence of stenosis, vasospasm or occlusion. There was no  evidence of intracranial disease.  Mildly elevated pulsatility indices throughout of unclear significance *See table(s) above for TCD measurements and observations.  Diagnosing physician: Antony Contras MD Electronically signed by Antony Contras MD on 08/16/2019 at 5:08:01 PM.    Final    VAS [place] TRANSCRANIAL DOPPLER  Result Date: 08/14/2019  Transcranial Doppler Indications: Subarachnoid hemorrhage. Comparison Study: 08/12/2019 Performing Technologist: Oliver Hum RVT  Examination Guidelines: A complete evaluation includes B-mode imaging, spectral Doppler, color Doppler, and power Doppler as needed of all accessible portions of each vessel. Bilateral testing is considered an integral part of a complete examination. Limited examinations for reoccurring indications may be performed as noted.  +----------+-------------+----------+-----------+-------+ RIGHT TCD Right VM (cm)Depth (cm)PulsatilityComment +----------+-------------+----------+-----------+-------+ MCA           66.00                 1.08            +----------+-------------+----------+-----------+-------+ ACA          -22.00                 1.02            +----------+-------------+----------+-----------+-------+ Term ICA      52.00                 1.36            +----------+-------------+----------+-----------+-------+ PCA           34.00                 0.86             +----------+-------------+----------+-----------+-------+ Opthalmic     17.00                 1.32            +----------+-------------+----------+-----------+-------+ ICA siphon    18.00                 1.62            +----------+-------------+----------+-----------+-------+ Vertebral    -19.00                 0.75            +----------+-------------+----------+-----------+-------+  +----------+------------+----------+-----------+-------+ LEFT TCD  Left VM (cm)Depth (cm)PulsatilityComment +----------+------------+----------+-----------+-------+ MCA          71.00                 1.11            +----------+------------+----------+-----------+-------+ ACA          -26.00                1.35            +----------+------------+----------+-----------+-------+ Term ICA     48.00                 1.12            +----------+------------+----------+-----------+-------+ PCA          22.00                 1.01            +----------+------------+----------+-----------+-------+ Opthalmic    21.00                 1.12            +----------+------------+----------+-----------+-------+ ICA siphon   25.00  1.79            +----------+------------+----------+-----------+-------+ Vertebral    -24.00                1.04            +----------+------------+----------+-----------+-------+  +------------+-------+-------+             VM cm/sComment +------------+-------+-------+ Prox Basilar-29.00  0.93   +------------+-------+-------+ Dist Basilar-37.00  0.91   +------------+-------+-------+ Summary:  Normal mean flow velocities inm majority of identidied vessles of anterior and posterior cerebral circulations. No definite evidence of vasospasm noted. *See table(s) above for TCD measurements and observations.  Diagnosing physician: Antony Contras MD Electronically signed by Antony Contras MD on 08/14/2019 at 4:03:24 PM.    Final    VAS [place]  TRANSCRANIAL DOPPLER  Result Date: 08/12/2019  Transcranial Doppler Indications: Subarachnoid hemorrhage. Comparison Study: 08/07/2019 Performing Technologist: Oliver Hum RVT  Examination Guidelines: A complete evaluation includes B-mode imaging, spectral Doppler, color Doppler, and power Doppler as needed of all accessible portions of each vessel. Bilateral testing is considered an integral part of a complete examination. Limited examinations for reoccurring indications may be performed as noted.  +----------+-------------+----------+-----------+-------+ RIGHT TCD Right VM (cm)Depth (cm)PulsatilityComment +----------+-------------+----------+-----------+-------+ MCA           65.00                 1.21            +----------+-------------+----------+-----------+-------+ ACA          -34.00                 0.93            +----------+-------------+----------+-----------+-------+ Term ICA      64.00                 1.21            +----------+-------------+----------+-----------+-------+ PCA           33.00                 1.36            +----------+-------------+----------+-----------+-------+ Opthalmic     18.00                 1.49            +----------+-------------+----------+-----------+-------+ ICA siphon    21.00                 1.71            +----------+-------------+----------+-----------+-------+ Vertebral    -18.00                 0.92            +----------+-------------+----------+-----------+-------+  +----------+------------+----------+-----------+-------+ LEFT TCD  Left VM (cm)Depth (cm)PulsatilityComment +----------+------------+----------+-----------+-------+ MCA          39.00                 0.77            +----------+------------+----------+-----------+-------+ ACA          -47.00                  1             +----------+------------+----------+-----------+-------+ Term ICA     68.00                 1.02             +----------+------------+----------+-----------+-------+  PCA          49.00                 0.75            +----------+------------+----------+-----------+-------+ Opthalmic    22.00                 1.68            +----------+------------+----------+-----------+-------+ ICA siphon   28.00                 1.74            +----------+------------+----------+-----------+-------+ Vertebral    -27.00                1.01            +----------+------------+----------+-----------+-------+  +------------+-------+-------+             VM cm/sComment +------------+-------+-------+ Prox Basilar-24.00   0.7   +------------+-------+-------+ Dist Basilar-36.00   0.9   +------------+-------+-------+ Summary: This was a normal transcranial Doppler study, with normal flow direction and velocity of all identified vessels of the anterior and posterior circulations, with no evidence of stenosis, vasospasm or occlusion. There was no evidence of intracranial disease.  *See table(s) above for TCD measurements and observations.  Diagnosing physician: Antony Contras MD Electronically signed by Antony Contras MD on 08/12/2019 at 2:11:29 PM.    Final    VAS [place] TRANSCRANIAL DOPPLER  Result Date: 08/12/2019  Transcranial Doppler Indications: Subarachnoid hemorrhage. Performing Technologist: June Leap RDMS, RVT  Examination Guidelines: A complete evaluation includes B-mode imaging, spectral Doppler, color Doppler, and power Doppler as needed of all accessible portions of each vessel. Bilateral testing is considered an integral part of a complete examination. Limited examinations for reoccurring indications may be performed as noted.  +----------+-------------+----------+-----------+--------------+ RIGHT TCD Right VM (cm)Depth (cm)Pulsatility   Comment     +----------+-------------+----------+-----------+--------------+ MCA           69.00                 1.33                    +----------+-------------+----------+-----------+--------------+ ACA          -45.00                 1.56                   +----------+-------------+----------+-----------+--------------+ Term ICA      37.00                 1.63                   +----------+-------------+----------+-----------+--------------+ PCA           28.00                 0.82                   +----------+-------------+----------+-----------+--------------+ Opthalmic     23.00                  0.7                   +----------+-------------+----------+-----------+--------------+ ICA siphon                                  not visualized +----------+-------------+----------+-----------+--------------+ Vertebral    -  15.00                  0.9                   +----------+-------------+----------+-----------+--------------+  +----------+------------+----------+-----------+-------+ LEFT TCD  Left VM (cm)Depth (cm)PulsatilityComment +----------+------------+----------+-----------+-------+ MCA          44.00                 1.38            +----------+------------+----------+-----------+-------+ ACA          -47.00                1.19            +----------+------------+----------+-----------+-------+ Term ICA     40.00                 1.09            +----------+------------+----------+-----------+-------+ PCA          34.00                 1.27            +----------+------------+----------+-----------+-------+ Opthalmic    26.00                 1.22            +----------+------------+----------+-----------+-------+ ICA siphon   37.00                 1.13            +----------+------------+----------+-----------+-------+ Vertebral    -23.00                1.19            +----------+------------+----------+-----------+-------+  +------------+-------+-------+             VM cm/sComment +------------+-------+-------+ Prox Basilar-26.00          +------------+-------+-------+ Dist Basilar-21.00         +------------+-------+-------+ Summary: This was a normal transcranial Doppler study, with normal flow direction and velocity of all identified vessels of the anterior and posterior circulations, with no evidence of stenosis, vasospasm or occlusion. There was no evidence of intracranial disease.  *See table(s) above for TCD measurements and observations.  Diagnosing physician: Antony Contras MD Electronically signed by Antony Contras MD on 08/12/2019 at 2:04:24 PM.    Final    VAS [place] TRANSCRANIAL DOPPLER  Result Date: 08/08/2019  Transcranial Doppler Indications: Subarachnoid hemorrhage. Performing Technologist: June Leap RDMS, RVT  Examination Guidelines: A complete evaluation includes B-mode imaging, spectral Doppler, color Doppler, and power Doppler as needed of all accessible portions of each vessel. Bilateral testing is considered an integral part of a complete examination. Limited examinations for reoccurring indications may be performed as noted.  +----------+-------------+----------+-----------+-------+ RIGHT TCD Right VM (cm)Depth (cm)PulsatilityComment +----------+-------------+----------+-----------+-------+ MCA           88.00                 1.24            +----------+-------------+----------+-----------+-------+ ACA          -61.00                 1.02            +----------+-------------+----------+-----------+-------+ Term ICA      47.00                 0.89            +----------+-------------+----------+-----------+-------+  PCA           37.00                 1.15            +----------+-------------+----------+-----------+-------+ Opthalmic     24.00                 2.04            +----------+-------------+----------+-----------+-------+ ICA siphon    31.00                 1.76            +----------+-------------+----------+-----------+-------+ Vertebral    -20.00                 1.14             +----------+-------------+----------+-----------+-------+  +----------+------------+----------+-----------+-------+ LEFT TCD  Left VM (cm)Depth (cm)PulsatilityComment +----------+------------+----------+-----------+-------+ MCA          74.00                 1.22            +----------+------------+----------+-----------+-------+ ACA          -31.00                 1.2            +----------+------------+----------+-----------+-------+ Term ICA     36.00                 1.59            +----------+------------+----------+-----------+-------+ PCA          54.00                 1.08            +----------+------------+----------+-----------+-------+ Opthalmic    25.00                 2.39            +----------+------------+----------+-----------+-------+ ICA siphon   36.00                 0.93            +----------+------------+----------+-----------+-------+ Vertebral    -25.00                1.25            +----------+------------+----------+-----------+-------+  +------------+-------+-------+             VM cm/sComment +------------+-------+-------+ Prox Basilar-33.00         +------------+-------+-------+ Dist Basilar-29.00         +------------+-------+-------+ Summary:  Mildly elevated bilateral middle cerebral artery mean flow velocities of unclear significance.No definite vasospasm noted. *See table(s) above for TCD measurements and observations.  Diagnosing physician: Antony Contras MD Electronically signed by Antony Contras MD on 08/08/2019 at 8:16:03 AM.    Final    CT ANGIO HEAD CODE STROKE  Result Date: 08/16/2019 CLINICAL DATA:  Speech difficulty. EXAM: CT ANGIOGRAPHY HEAD TECHNIQUE: Multidetector CT imaging of the head was performed using the standard protocol during bolus administration of intravenous contrast. Multiplanar CT image reconstructions and MIPs were obtained to evaluate the vascular anatomy. CONTRAST:  80 cc Omnipaque  350 intravenous COMPARISON:  Brain MRI from 9 days ago FINDINGS: CT HEAD Brain: Decrease in left perisylvian subarachnoid hemorrhage. 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 No hydrocephalus or  extra-axial collection. Vascular: Left MCA bifurcation aneurysm coil mass Skull: Normal. Negative for fracture or focal lesion. Sinuses: Opacified right posterior ethmoid air cell. Orbits: Negative CTA HEAD Anterior circulation: Left MCA bifurcation aneurysm coil mass with associated streak artifact. No detected residual sac enhancement. The adjacent vessels are patent without visible luminal thrombus, but limited adjacent to the treatment site. No major downstream branch occlusion is seen. No embolic disease seen to the other anterior vessels. 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 Posterior circulation: Symmetric vertebral arteries. The vertebral and basilar arteries are smooth and widely patent. Fetal type right PCA. No branch occlusion, beading, or aneurysm. Venous sinuses: Unremarkable in the arterial phase Anatomic variants: As above These results were communicated to Dr Myna Hidalgo at [facility] 5/14/2021by text page via the [facility] messaging system. IMPRESSION: 1. 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 2. Left MCA bifurcation aneurysm coiling with expected regional artifact. 3. Decreasing left perisylvian subarachnoid hemorrhage. Electronically Signed   By: Monte Fantasia M.D.   On: 08/16/2019 07:32   IR NEURO EACH ADD'L AFTER BASIC UNI LEFT (MS)  Result Date: 08/07/2019 INDICATION: 64 year old male presenting to the emergency with 1 day history of intermittent dysphasia and right hand numbness and weakness. Head CT showed a left sylvian subarachnoid hemorrhage (mFS 1). 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 He had been started on dual anti-platelet therapy with aspirin and Brilinta 5 days ago in anticipation to schedule procedure on 08/07/2019. At admission, he presented mild expressive aphasia with no motor deficit. He was then taken to our service for emergency angiogram and left MCA aneurysm treatment. EXAM: Ultrasound-guided vascular access Diagnostic cerebral angiogram 3D rotational angiogram Balloon assisted coil embolization of brain aneurysm Flat panel CT COMPARISON:  Diagnostic cerebral angiogram July 19, 2019 MEDICATIONS: No antibiotics utilized during the procedure. A total of 5000 units of heparin IV administered. ANESTHESIA/SEDATION: The procedure was performed under general anesthesia. CONTRAST:  140 cc of Omnipaque 240 FLUOROSCOPY TIME:  Fluoroscopy Time: 72 minutes 18 seconds (3059 mGy). COMPLICATIONS: None immediate. TECHNIQUE: Informed written consent was obtained from the patient after a thorough discussion of the procedural risks, benefits and alternatives. All questions were addressed. Maximal Sterile Barrier Technique was utilized including caps, mask, sterile gowns, sterile gloves, sterile drape, hand hygiene and skin antiseptic. A timeout was performed prior to the initiation of the procedure. Real-time ultrasound guidance was utilized for vascular access including the acquisition of a permanent ultrasound image documenting patency of the accessed vessel. Using a micropuncture kit and the modified Seldinger technique, access was gained to the right common femoral artery and an 8 French sheath was placed. Then, a 5 [place] DAV catheter was navigated over a 0.035 inch Terumo Glidewire into the aortic arch under fluoroscopy. The catheter was  then placed into the left common carotid artery. Frontal and lateral road map was obtained. The catheter was then navigated into the left internal artery. Frontal and lateral angiograms of the head were. 3D rotational angiograms were acquired and post processed in a separate workstation under concurrent attending physician supervision. Selected images were sent to PACS. Magnified frontal and lateral angiograms of the  head in working projections were obtained. 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 PROCEDURE: The DAV catheter was exchanged over the wire and under biplane roadmap for a cerabase guide catheter which was placed in the cervical left ICA. A 6 [place] intermediate catheter was then navigated over the [employer] into the petrous segment of the left ICA. Magnified frontal and lateral angiograms of the head were obtained in the working projections. Using biplane roadmap, a headway duo microcatheter was navigated over a synchro 2 micro guidewire into the left MCA bifurcation aneurysm pouch. Attempted primary coiling proved unsuccessful due to wide neck. A 3 x 15 mm Transform compliant balloon was navigated over a synchro 14 micro guidewire in a parallel fashion through the [place] intermediate catheter into the left MCA M2 inferior division branch with the balloon at the level of the aneurysm neck. Then, a 4 mm x 6 cm target 360 soft framing coil was placed within the aneurysm pouch while the transform balloon was inflated. The balloon was then deflated. The coil mass remained stable within the aneurysm pouch. Control angiogram showed maintained patency of the daughter vessels. The coil was then detached. In a similar fashion, 3 filling and 2 finishing coils were placed within the aneurysm pouch (3 mm x 6 cm target 360 ultra, 3 mm x 4 cm target 360 ultra, 2.5 mm x 4 cm target 360 Nano, 1.5 mm by 4 cm target 360 nano and 1.5 mm x 4 cm  target 360 nano). Control angiograms were obtained. The microcatheter was then removed followed by removal of the transform balloon. Follow-up magnified frontal and lateral angiograms of the head in the working projections were obtained showing adequate aneurysm occlusion with good coil packing density and preservation of the parent artery and daughter arteries. Delayed magnified frontal and lateral angiograms of the head in the working projections were obtained showing stable coil mass and no evidence of clot formation on the coil mass surface. Frontal and lateral angiograms with views of the entire head were obtained. No evidence of thromboembolic complication noted. The catheter construct was subsequently withdrawn. Flat panel CT of the head was obtained and post processed in a separate workstation with concurrent attending physician supervision. Selected images were sent to PACS. Stable appearance of known is left sided subarachnoid hemorrhage with no evidence of intra procedural bleed. A right common femoral artery angiogram with right anterior oblique view was obtained. Mild atherosclerotic changes of the right common femoral artery are noted, without stenosis. The access is at the level of the common femoral artery which has appropriate caliber for closure device utilization. The 8 French sheath was removed over the wire and a Perclose ProGlide closure device was used for access closure. Immediate hemostasis was achieved. 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 PLAN: 1. Patient transferred to ICU for anesthesia recovery and vasospasm watch. 2. Brilinta can be discontinued while remaining on aspirin 81 mg. Electronically Signed   By: Pedro Earls M.D.   On: 08/07/2019 13:43    Treatments: Endovascular staged embolization of ruptured left MCA bifurcation aneurysm using primary coiling 08/06/2019 and Atlas  stent placement 08/19/2019 by Dr. Karenann Cai.  Discharge Exam: Blood pressure 118/75, pulse 75, temperature 98.1 F (36.7 C), temperature source Oral, resp. rate 17, height 5' 11"  (1.803 m), weight 190 lb (86.2 kg), SpO2 99 %.  Physical Exam Vitals and nursing note reviewed.  Constitutional:      General: [name] not in acute distress.    Appearance: Normal appearance.  Cardiovascular:     Rate and Rhythm: Normal rate and regular rhythm.     Heart sounds: Normal heart sounds. No murmur.  Pulmonary:     Effort: Pulmonary effort is normal. No respiratory distress.     Breath sounds: Normal breath sounds. No wheezing.  Skin:    General: Skin is warm and dry.     Comments: Right groin incision soft without active bleeding or hematoma.  Neurological:     Mental Status: [name] alert.     Comments: Alert, awake, and oriented x3. Follows simple commands. Hesitant with speech but expressive aphasia continues to improve. EOMs intact bilaterally without nystagmus or subjective diplopia. Mild right facial droop. Tongue midline. Hand grip 5/5 bilaterally, lower extremity strength 5/5 bilaterally. Facial and lower extremity sensation intact and equal bilaterally; patient reports decreased sensation of right forearm (posterior forearm/elbow) compared to left forearm. No pronator drift.  Distal pulses (DPs and PTs) 2+ bilaterally.     Disposition: Discharge disposition: 01-Home or Self Care       Discharge Instructions    Ambulatory referral to Neurology   Complete by: As directed    Follow up with Dr. Leonie Man at [facility] in 4-6 weeks. Too complicated for NP to follow. Thanks.   Ambulatory referral to Occupational Therapy   Complete by: As directed    Ambulatory referral to Speech Therapy   Complete by: As directed    Call MD for:  difficulty breathing, headache or visual disturbances   Complete by: As directed    Call MD for:  extreme fatigue   Complete by: As directed    Call MD  for:  hives   Complete by: As directed    Call MD for:  persistant dizziness or light-headedness   Complete by: As directed    Call MD for:  persistant nausea and vomiting   Complete by: As directed    Call MD for:  redness, tenderness, or signs of infection (pain, swelling, redness, odor or green/yellow discharge around incision site)   Complete by: As directed    Call MD for:  severe uncontrolled pain   Complete by: As directed    Call MD for:  temperature >100.4   Complete by: As directed    Diet - low sodium heart healthy   Complete by: As directed    Discharge instructions   Complete by: As directed    Take Brilinta 90 mg once daily. Take Aspirin 81 mg once daily. No stooping, bending, or lifting more than 10 pounds for 1 week. Stay hydrated by drinking plenty of water. No driving self for 2 weeks. Follow-up with PCP regarding smoking cessation within 2 weeks of discharge. Plan to follow-up with Dr. Karenann Cai in clinic 2 weeks after discharge.   Increase activity slowly   Complete by: As directed    Remove dressing in 24 hours   Complete by: As directed    From right groin- 24 hours after discharge.     Allergies as of 08/20/2019      Reactions   Other    seasonal      Medication List    STOP taking these medications   diazepam 5 MG tablet Commonly known as: Valium     TAKE these medications   acetaminophen 500 MG tablet Commonly known as:  TYLENOL Take 1,000 mg by mouth every 6 (six) hours as needed for mild pain or headache.   aspirin 81 MG EC tablet Take 81 mg by mouth daily. Swallow whole.   Brilinta 90 MG Tabs tablet Generic drug: ticagrelor Take 90 mg by mouth daily.   ibuprofen 200 MG tablet Commonly known as: ADVIL Take 400 mg by mouth every 6 (six) hours as needed for moderate pain.   nicotine 14 mg/24hr patch Commonly known as: NICODERM CQ - dosed in mg/24 hours Place 1 patch (14 mg total) onto the skin daily. Start taking on: Aug 21, 2019      Follow-up Information    Garvin Fila, MD. Schedule an appointment as soon as possible for a visit in 4 week(s).   Specialties: Neurology, Radiology Contact information: [address] [place] [address] [place], Erven Colla, MD Follow up in 2 week(s).   Specialties: Radiology, Interventional Radiology Why: Please follow-up with Dr. Karenann Cai in clinic 2 weeks after discharge. Our office will call you to set up this appointment. Contact information: [address] [place] [address] [phone]        [place] Follow up.   Specialty: Rehabilitation Why: Outpatient occupational and speech therapy.  Rehab center will call you for an appointment Contact information: [place] [id number] [place] [phone]           Electronically Signed: Earley Abide, PA-C 08/20/2019, 12:07 PM   I have spent Greater Than 30 Minutes discharging Holli Humbles.

## 2019-08-20 NOTE — TOC Transition Note (Signed)
Transition of Care [facility]) - CM/SW Discharge Note   Patient Details  Name: Victor Pena MRN: [id number] Date of Birth: [birth year]  Transition of Care [facility]) CM/SW Contact:  Glennon Mac, RN Phone Number: 08/20/2019, 12:24 PM   Clinical Narrative:   Pt medically stable for discharge home today with family.  OP OT and speech therapy recommended; referral to [facility] for follow up.  Rehab Center will call pt for an appointment; rehab center info placed on AVS.    Final next level of care: OP Rehab Barriers to Discharge: Barriers Resolved                         Discharge Plan and Services   Discharge Planning Services: CM Consult                                 Social Determinants of Health (SDOH) Interventions     Readmission Risk Interventions No flowsheet data found.  Quintella Baton, RN, BSN  Trauma/Neuro ICU Case Manager [phone]

## 2019-08-20 NOTE — Discharge Instructions (Signed)
Endovascular Therapy for Cerebral Aneurysm, Care After This sheet gives you information about how to care for yourself after your procedure. Your health care provider may also give you more specific instructions. If you have problems or questions, contact your health care provider. What can I expect after the procedure? After the procedure, it is common to have:  Pain, tenderness, and swelling around your incision.  Headaches. Follow these instructions at home: Medicines  Take Brilinta 90 mg once daily and Aspirin 81 mg once daily. Incision care  Follow instructions from your health care provider about how to take care of your incision. Make sure you: ? Wash your hands with soap and water before you change your bandage (dressing). If soap and water are not available, use hand sanitizer. ? Ok to remove dressing from right groin 24 hours after discharge. ? Ensure area (right groin) remains clean and dry until healed.  Check your incision area every day for signs of infection. Check for: ? Redness, swelling, or pain. ? Fluid or blood. ? Warmth. ? Pus or a bad smell. Activity  Ask your health care provider what activities are safe for you during recovery. Most people can return to normal activities 2-6 weeks after the procedure.  Do not drive self for 2 weeks.  Do not lift anything that is heavier than 10 lb (4.5 kg) for 1 week.  Attend rehabilitation therapy as told by your health care provider. This may include: ? Occupational therapy. ? Speech-language therapy. Eating and drinking  Stay hydrated by drinking plenty of water.  Eat a healthy diet. This includes plenty of fruits and vegetables, whole grains, low-fat dairy products, and lean protein. General instructions  Do not use any products that contain nicotine or tobacco, such as cigarettes and e-cigarettes.  Do not take baths, swim, or use a hot tub for 7 days post-procedure.  Manage your stress. If you need help with  this, talk with your health care provider.  Keep all follow-up visits as told by your health care provider (2 weeks after discharge). This is important. Contact a health care provider if:  You have redness, swelling, or pain around your incision.  You have fluid or blood coming from your incision.  Your incision feels warm to the touch.  You have pus or a bad smell coming from your incision.  You have a fever. Get help right away if:  You have: ? Stiffness in your neck. ? Pain, numbness, weakness, or swelling in your legs. ? Severe chest pain. ? Difficulty breathing. ? Confusion.  You have any symptoms of stroke. "BE FAST" is an easy way to remember the main warning signs of stroke: ? B - Balance. Signs are dizziness, sudden trouble walking, or loss of balance. ? E - Eyes. Signs are trouble seeing or a sudden change in vision. ? F - Face. Signs are sudden weakness or numbness of the face, or the face or eyelid drooping on one side. ? A - Arms. Signs are weakness or numbness in an arm. This happens suddenly and usually on one side of the body. ? S - Speech. Signs are sudden trouble speaking, slurred speech, or trouble understanding what people say. ? T - Time. Time to call emergency services. Write down what time symptoms started.  You have other signs of stroke, such as: ? A sudden, severe headache that does not get better with medicine. ? Sudden nausea or vomiting. ? A seizure. These symptoms may represent a serious problem  that is an emergency. Do not wait to see if the symptoms will go away. Get medical help right away. Call your local emergency services (911 in the [place] Do not drive yourself to the hospital. This information is not intended to replace advice given to you by your health care provider. Make sure you discuss any questions you have with your health care provider. Document Revised: 05/11/2018 Document Reviewed: 06/27/2016 Elsevier Patient Education  [place].

## 2019-08-20 NOTE — Progress Notes (Signed)
PROGRESS NOTE    Victor Pena  ATF:[id number] DOB: March 13, 1956 DOA: 08/06/2019 PCP: Patient, No Pcp Per    Brief Narrative:  Victor Pena is a 64 y.o. male with medical history significant of intracranial aneurysm who initially presented with complaints of difficulty speaking with right hand weakness and numbness.  CT scan of the brain revealed a subarachnoid bleed or patient was noted to have a left MCA aneurysm.  IR was consulted and performed cerebral arteriogram with emergent balloon assisted coiling on 5/4 by Dr. Sherlon Handing.  Since that time patient expressive aphasia has continued to improve daily.  Patient is to remain in hospital for total of 14 days to monitor for vasospasm with daily transcranial Dopplers.   Assessment & Plan:   Active Problems:   Subarachnoid hemorrhage (HCC)   Brain aneurysm   Difficulty with speech  Aneurysmal subarachnoid hemorrhagestatus post coiling on 5/4  Patient presenting with dysphagia was found to have a subarachnoid hemorrhage with a ruptured left MCA bifurcation patient aneurysm, patient underwent cooling by interventional radiology on 08/06/2019.  CT angiogram head 5/14 notable for decreased size of perisylvian subarachnoid hemorrhage.  Underwent repeat CT angiogram with left MCA stent placement on 08/19/2019 --Continue close monitoring by IR/neurology for spasm --Continue aspirin, Lipitor, amlodipine --Nimodipine 60mg  PO q4h; plan 21-day course  Acute left temporal/parietal CVA Patient this morning with slurred speech, had CT angiogram head with small cortical based infarct clustered in the posterior left temporal and low parietal cortex which is new since brain MR 9 days prior; also notable decreased in perisylvian SAH. EEG within normal limits, no seizures or epileptiform discharges noted.  Neurology believes this could be related to the amount of exposed coil in his wide-neck aneurysm. --Restarted Brilinta 90 mg p.o. daily 5/14 by interventional  neurology  Hyperlipidemia LDL 149, on Lipitor 40 mg  Metabolic acidosis Likely from dehydration.  Alcohol abuse No signs of withdrawal symptoms.  Counseled on need for cessation. --continue thiamine, folic acid, multivitamin  Tobacco abuse Counseled on need for cessation. --Nicotine patch  B12 deficiency B12 132, low --started B12 6/14 PO daily  DVT prophylaxis: SCDs Code Status: Full code Family Communication: Family updated at bedside this morning  Disposition Plan:  Status is: Inpatient  Remains inpatient appropriate because:Ongoing diagnostic testing needed not appropriate for outpatient work up and Inpatient level of care appropriate due to severity of illness patient needs 14 days of close monitoring following subarachnoid hemorrhage status post coiling for frequent transcranial Dopplers monitoring for vasospasm   Dispo: The patient is from: Home              Anticipated d/c is to: Home              Anticipated d/c date is: Per primary, Dr.                Consultants:   Interventional radiology  Neurology  Procedures:   Left frontoparietal SAH secondary to left MCA aneurysm rupture status post emergent coiling 5/4  Left MCA stent placement 5/17  Antimicrobials:   None   Subjective: Patient seen and examined bedside, resting comfortably.  Family present.  States which he can discharge home today, "hospital making me more sick".  Discussed with patient and family at bedside that given his severity of illness, aneurysm with subarachnoid hemorrhage and he needs close monitoring as this is life-threatening; in which patient understood stating that his father passed away from this same disease.  No other specific  complaints or concerns at this time.  Denies headache, no visual changes, no chest pain, no palpitations, no shortness of breath, no nausea/vomiting/diarrhea, no fever/chills/night sweats, no weakness.  No other acute events overnight  per nursing staff.  Objective: Vitals:   08/20/19 0640 08/20/19 0708 08/20/19 0800 08/20/19 0900  BP: 115/77 (!) 138/108 (!) 128/97 119/74  Pulse: (!) 56 61 63 72  Resp: 17 10 17 13   Temp:   98.1 F (36.7 C)   TempSrc:   Oral   SpO2: 99% 100% 95% 95%  Weight:      Height:        Intake/Output Summary (Last 24 hours) at 08/20/2019 0925 Last data filed at 08/20/2019 0500 Gross per 24 hour  Intake 1100 ml  Output 1560 ml  Net -460 ml   Filed Weights   08/06/19 1213  Weight: 86.2 kg    Examination:  General exam: Appears calm and comfortable  Respiratory system: Clear to auscultation. Respiratory effort normal. Cardiovascular system: S1 & S2 heard, RRR. No JVD, murmurs, rubs, gallops or clicks. No pedal edema. Gastrointestinal system: Abdomen is nondistended, soft and nontender. No organomegaly or masses felt. Normal bowel sounds heard. Central nervous system: Alert and oriented. No focal neurological deficits. Extremities: Symmetric 5 x 5 power. Skin: No rashes, lesions or ulcers Psychiatry: Judgement and insight appear normal. Mood & affect appropriate.     Data Reviewed: I have personally reviewed following labs and imaging studies  CBC: Recent Labs  Lab 08/15/19 0417 08/19/19 0320  WBC  --  10.2  HGB  --  12.8*  HCT 36.7* 38.2*  MCV  --  103.2*  PLT  --  431*   Basic Metabolic Panel: Recent Labs  Lab 08/19/19 0320  NA 139  K 3.8  CL 109  CO2 21*  GLUCOSE 104*  BUN 17  CREATININE 0.93  CALCIUM 9.3  MG 1.6*   GFR: Estimated Creatinine Clearance: 85.5 mL/min (by C-G formula based on SCr of 0.93 mg/dL). Liver Function Tests: No results for input(s): AST, ALT, ALKPHOS, BILITOT, PROT, ALBUMIN in the last 168 hours. No results for input(s): LIPASE, AMYLASE in the last 168 hours. No results for input(s): AMMONIA in the last 168 hours. Coagulation Profile: No results for input(s): INR, PROTIME in the last 168 hours. Cardiac Enzymes: No results for  input(s): CKTOTAL, CKMB, CKMBINDEX, TROPONINI in the last 168 hours. BNP (last 3 results) No results for input(s): PROBNP in the last 8760 hours. HbA1C: No results for input(s): HGBA1C in the last 72 hours. CBG: Recent Labs  Lab 08/16/19 0549  GLUCAP 99   Lipid Profile: No results for input(s): CHOL, HDL, LDLCALC, TRIG, CHOLHDL, LDLDIRECT in the last 72 hours. Thyroid Function Tests: No results for input(s): TSH, T4TOTAL, FREET4, T3FREE, THYROIDAB in the last 72 hours. Anemia Panel: No results for input(s): VITAMINB12, FOLATE, FERRITIN, TIBC, IRON, RETICCTPCT in the last 72 hours. Sepsis Labs: No results for input(s): PROCALCITON, LATICACIDVEN in the last 168 hours.  No results found for this or any previous visit (from the past 240 hour(s)).       Radiology Studies: No results found.      Scheduled Meds: . aspirin  81 mg Oral Daily  . atorvastatin  40 mg Oral Daily  . Chlorhexidine Gluconate Cloth  6 each Topical Daily  . folic acid  1 mg Oral Daily  . multivitamin with minerals  1 tablet Oral Daily  . nicotine  14 mg Transdermal Daily  .  niMODipine  60 mg Oral Q4H  . pantoprazole  40 mg Oral Daily  . pneumococcal 23 valent vaccine  0.5 mL Intramuscular Tomorrow-1000  . senna-docusate  1 tablet Oral BID  . thiamine  100 mg Oral Daily   Or  . thiamine  100 mg Intravenous Daily  . ticagrelor  90 mg Oral Daily  . vitamin B-12  1,000 mcg Oral Daily   Continuous Infusions: . sodium chloride 10 mL/hr at 08/19/19 1242  . clevidipine Stopped (08/19/19 2134)     LOS: 14 days    Time spent: 34 minutes spent on chart review, discussion with nursing staff, consultants, updating family and interview/physical exam; more than 50% of that time was spent in counseling and/or coordination of care.    Trevonte J [place] DO [facility] Available via Epic secure chat 7am-7pm After these hours, please refer to coverage provider listed on [email or web address] 08/20/2019, 9:25 AM

## 2019-08-21 ENCOUNTER — Telehealth (HOSPITAL_COMMUNITY): Payer: Self-pay

## 2019-08-21 NOTE — Telephone Encounter (Signed)
Called to schedule 2 wk f/u, no answer, left vm. AW 

## 2019-09-03 ENCOUNTER — Ambulatory Visit (HOSPITAL_COMMUNITY)
Admission: RE | Admit: 2019-09-03 | Discharge: 2019-09-03 | Disposition: A | Discharge status: Home / Self Care | Payer: Managed Care, Other (non HMO) | Source: Ambulatory Visit | Attending: Student | Admitting: Student

## 2019-09-03 ENCOUNTER — Other Ambulatory Visit (HOSPITAL_COMMUNITY): Payer: Self-pay | Admitting: Neuroradiology

## 2019-09-03 ENCOUNTER — Other Ambulatory Visit: Payer: Self-pay

## 2019-09-03 DIAGNOSIS — I671 Cerebral aneurysm, nonruptured: Secondary | ICD-10-CM

## 2019-09-03 NOTE — Progress Notes (Signed)
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 Right ICA aneurysm, unruptured.  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 He was seen in office on 07/26/2019 for planning of elective aneurysm treatment. He was started on dual anti-platelet therapy with ASA + brilinta in anticipation to scheduled procedure on 08/07/2019. On 08/05/2019, his wife called and said he had had an transient and  brief episode of right hand numbness and headache. Wife attributed it to the fact that he was exceedingly nervous. The following day, patient had recurrence of hand numbness and new onset aphasia. He was instructed to come to the ER. 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 He underwent balloon assisted coiling of this aneurysm without incident or complication. Brilinta was then discontinued. He had an uneventful recovery while admitted for vasospasm watch until 08/16/2019, when he had a transient episode of worsening aphasia. Repeat head CT showed new punctate infarct in the left posterior temporal region. This may have been related to microvascular infarct versus platelet aggregation at the coil mass interface given wide neck. He was restarted on Brilinta at that time. On 08/19/2019, he underwent a diagnostic cerebral angiogram that showed unimpeded flow in the left MCA branches. There was a suggestion of minimal platelet aggregation at the coil mass-artery interface. Decision was made to place a stent in the left MCA, across the neck of the aneurysm. The procedure went without incident and the patient was  discharged home the next day.    Past Medical History:  Diagnosis Date  . Diplopia   . Intracranial aneurysm     Past Surgical History:  Procedure Laterality Date  . BACK SURGERY    . IR 3D INDEPENDENT WKST  07/19/2019  . IR 3D INDEPENDENT WKST  07/19/2019  . IR 3D INDEPENDENT WKST  08/06/2019  . IR 3D INDEPENDENT WKST  08/19/2019  . IR ANGIO EXTERNAL CAROTID SEL EXT CAROTID BILAT MOD SED  07/19/2019  . IR ANGIO INTRA EXTRACRAN SEL INTERNAL CAROTID BILAT MOD SED  07/19/2019  . IR ANGIO VERTEBRAL SEL VERTEBRAL BILAT MOD SED  07/19/2019  . IR ANGIOGRAM FOLLOW UP STUDY  08/06/2019  . IR ANGIOGRAM FOLLOW UP STUDY  08/06/2019  . IR ANGIOGRAM FOLLOW UP STUDY  08/06/2019  . IR ANGIOGRAM FOLLOW UP STUDY  08/06/2019  . IR ANGIOGRAM FOLLOW UP STUDY  08/06/2019  . IR ANGIOGRAM FOLLOW UP STUDY  08/06/2019  . IR CT HEAD LTD  08/06/2019  . IR CT HEAD LTD  08/19/2019  . IR INTRA CRAN STENT  08/19/2019  . IR NEURO EACH ADD'L AFTER BASIC UNI LEFT (MS)  08/06/2019  . IR TRANSCATH/EMBOLIZ  08/06/2019  . IR US GUIDE VASC ACCESS RIGHT  07/19/2019  . IR US GUIDE VASC ACCESS RIGHT  08/06/2019  . IR US GUIDE VASC ACCESS RIGHT  08/19/2019  . RADIOLOGY WITH ANESTHESIA N/A 08/06/2019   Procedure: IR WITH ANESTHESIA;  Surgeon: Radiologist, Medication, MD;  Location: [place];  Service: Radiology;  Laterality: N/A;  . RADIOLOGY WITH ANESTHESIA N/A 08/19/2019   Procedure: STENT PLACEMENT;  Surgeon: Pedro Earls, MD;  Location: [place];  Service: Radiology;  Laterality: N/A;    Allergies: Other  Medications: Prior to Admission medications   Medication Sig Start Date End Date Taking? Authorizing Provider  acetaminophen (TYLENOL) 500 MG tablet Take 1,000 mg by mouth every 6 (six) hours as needed for mild pain or headache.    [provider]  aspirin 81 MG EC tablet Take 81 mg by mouth daily. Swallow whole.    [provider]  ibuprofen (ADVIL) 200 MG tablet Take 400 mg by mouth every 6 (six) hours as  needed for moderate pain.    [provider]  nicotine (NICODERM CQ - DOSED IN MG/24 HOURS) 14 mg/24hr patch Place 1 patch (14 mg total) onto the skin daily. 08/21/19   Louk, Waylan Boga, PA-C  ticagrelor (BRILINTA) 90 MG TABS tablet Take 90 mg by mouth daily.     [provider]     Family History  Problem Relation Age of Onset  . Aneurysm Father     Social History   Socioeconomic History  . Marital status: Married    Spouse name: Not on file  . Number of children: 4  . Years of education: 30  . Highest education level: Not on file  Occupational History  . Occupation: owner   Tobacco Use  . Smoking status: Current Every Day Smoker  . Smokeless tobacco: Never Used  Substance and Sexual Activity  . Alcohol use: Yes    Alcohol/week: 10.0 standard drinks    Types: 10 Glasses of wine per week    Comment: one to two glasses of night  . Drug use: Not on file  . Sexual activity: Not on file  Other Topics Concern  . Not on file  Social History Narrative   Right handed   3 story home   Drinks caffeine   Social Determinants of Health   Financial Resource Strain:   . Difficulty of Paying Living Expenses:   Food Insecurity:   . Worried About Programme researcher, broadcasting/film/video in the Last Year:   . Barista in the Last Year:   Transportation Needs:   . Freight forwarder (Medical):   Marland Kitchen Lack of Transportation (Non-Medical):   Physical Activity:   . Days of Exercise per Week:   . Minutes of Exercise per Session:   Stress:   . Feeling of Stress :   Social Connections:   . Frequency of Communication with Friends and Family:   . Frequency of Social Gatherings with Friends and Family:   . Attends Religious Services:   . Active Member of Clubs or Organizations:   . Attends Banker Meetings:   Marland Kitchen Marital Status:      Vital Signs: Pulse 78 ppm. SpO2 100%. BP 140/97 mmHg left arm. 143/94 mmHg right arm.  Physical Exam Constitutional:       Appearance: Normal appearance. [name] normal weight.  Cardiovascular:     Rate and Rhythm: Normal rate and regular rhythm.     Pulses: Normal pulses.     Heart sounds: Normal heart sounds.  Pulmonary:     Effort: Pulmonary effort is normal.     Breath sounds: Normal breath sounds.  Neurological:     Mental Status: [name] alert.     Sensory: Sensory deficit present.     Comments: Mild paresthesia of anteromedial right forearm. No speech deficit noted.     Imaging: No results found.  Labs:  CBC: Recent Labs    08/09/19  9563 08/09/19 8756 08/10/19 0559 08/11/19 0628 08/15/19 0417 08/19/19 0320  WBC 7.4  --  7.2 7.5  --  10.2  HGB 13.2  --  13.0 13.6  --  12.8*  HCT 40.0   < > 38.4* 39.9 36.7* 38.2*  PLT 269  --  296 338  --  431*   < > = values in this interval not displayed.    COAGS: Recent Labs    07/15/19 1344 08/06/19 1217 08/06/19 1244  INR 1.0 1.0 1.0  APTT 29 27 28     BMP: Recent Labs    08/09/19 0916 08/10/19 0559 08/11/19 0628 08/19/19 0320  NA 138 139 140 139  K 3.7 4.2 3.7 3.8  CL 106 105 108 109  CO2 22 25 21* 21*  GLUCOSE 104* 101* 99 104*  BUN 15 20 15 17   CALCIUM 9.0 9.2 9.2 9.3  CREATININE 0.77 0.96 0.73 0.93  GFRNONAA >60 >60 >60 >60  GFRAA >60 >60 >60 >60    LIVER FUNCTION TESTS: Recent Labs    08/06/19 1217  BILITOT 0.9  AST 49*  ALT 46*  ALKPHOS 60  PROT 7.4  ALBUMIN 4.0    Assessment: Mr. Bisig made a remarkable recovery and has minimal persistent right forearm decreased sensation in the anteromedial aspect. Although no speech deficit was noted today, he tells me he has some hesitancy at times. [name] scheduled for a diagnostic angiogram and treatment of his right ICA aneurysm with a flow diverter on October 02, 2019. He was instructed to continue on aspirin and brilinta. The planned procedure was discussed and all questions were answered to his satisfaction.    Signed: Elige Radon, MD 09/03/2019, 3:19  PM    I spent a total of  15 Minutes in face to face in clinical consultation, greater than 50% of which was counseling/coordinating care for right ICA aneurysm.

## 2019-09-05 ENCOUNTER — Ambulatory Visit (INDEPENDENT_AMBULATORY_CARE_PROVIDER_SITE_OTHER): Payer: Managed Care, Other (non HMO) | Admitting: Physician Assistant

## 2019-09-05 ENCOUNTER — Encounter: Payer: Self-pay | Admitting: Physician Assistant

## 2019-09-05 ENCOUNTER — Other Ambulatory Visit: Payer: Self-pay

## 2019-09-05 DIAGNOSIS — L400 Psoriasis vulgaris: Secondary | ICD-10-CM

## 2019-09-05 MED ORDER — CALCIPOTRIENE-BETAMETH DIPROP 0.005-0.064 % EX SUSP: Freq: Every day | CUTANEOUS | 2 refills | Status: DC

## 2019-09-05 NOTE — Progress Notes (Signed)
   Follow up Visit  Subjective  Victor Pena is a 64 y.o. male who presents for the following: Skin Problem (Here for breaking out on palms of hands and bottom of feet. patient also has some breakout on lower legs as well. They do itch and crack open into sores. Dr. Jorja Loa has treated patient in the past year for eczema. Gave him Trianex rx. Patient said it did not help very much with the palms of hands and feet. ). Having issue with palms and soles and has been going on for months. At last visit with ST it was on his shins and was diagnosed as eczema and was given Trianex. The Trianex did seem to help the shins but not the palms or soles. Has also tried many moisturizers. The hands and feet itch and crack and hurt. The palms and soles are red and dry and peeling. He feels like his dad did get a rash on his palms but had contributed it to a work exposure. He also feels like he has plaques over the years on his elbows and tailbone. He also feels like his scalp stays dry. He does not feel like he has had excessive or early joint pain or stiffness. He does smoke but is trying to cut back since he has had surgery for brain aneurysm.  Objective  Well appearing patient in no apparent distress; mood and affect are within normal limits.  A focused examination was performed including scalp, hands, arms, legs, feet, back, face. Relevant physical exam findings are noted in the Assessment and Plan. No suspicious moles noted on back.   Objective  Left Hand - Anterior, Left Middle Plantar Surface, Mid Occipital Scalp, Right Hand - Anterior, Right Middle Plantar Surface: Well-marginated erythematous papules/plaques with silvery scale. Palms and soles with erythema, scale and fissures.   Images      Assessment & Plan  Psoriasis vulgaris (5) Left Hand - Anterior; Right Hand - Anterior; Left Middle Plantar Surface; Right Middle Plantar Surface; Mid Occipital Scalp  We discussed topical options to start  with. We also discussed [place] and biologics like [place]. I also discussed with him that smoking can contribute to palmar psoriasis.   calcipotriene-betamethasone (TACLONEX) external suspension - Left Hand - Anterior, Left Middle Plantar Surface, Right Hand - Anterior, Right Middle Plantar Surface

## 2019-09-28 ENCOUNTER — Other Ambulatory Visit (HOSPITAL_COMMUNITY): Payer: Managed Care, Other (non HMO)

## 2019-09-30 ENCOUNTER — Other Ambulatory Visit (HOSPITAL_COMMUNITY)
Admission: RE | Admit: 2019-09-30 | Discharge: 2019-09-30 | Disposition: A | Discharge status: Home / Self Care | Payer: Managed Care, Other (non HMO) | Source: Ambulatory Visit | Attending: Neuroradiology | Admitting: Neuroradiology

## 2019-09-30 DIAGNOSIS — Z20822 Contact with and (suspected) exposure to covid-19: Secondary | ICD-10-CM | POA: Diagnosis not present

## 2019-09-30 DIAGNOSIS — Z01812 Encounter for preprocedural laboratory examination: Secondary | ICD-10-CM | POA: Diagnosis not present

## 2019-09-30 LAB — SARS CORONAVIRUS 2 (TAT 6-24 HRS): SARS Coronavirus 2: NEGATIVE

## 2019-10-01 ENCOUNTER — Other Ambulatory Visit: Payer: Self-pay | Admitting: Radiology

## 2019-10-01 ENCOUNTER — Other Ambulatory Visit: Payer: Self-pay

## 2019-10-01 ENCOUNTER — Encounter (HOSPITAL_COMMUNITY): Payer: Self-pay | Admitting: *Deleted

## 2019-10-01 NOTE — Progress Notes (Signed)
Pt denies SOB, chest pain, and being under the care of a cardiologist. Pt stated that he receives primary care at [facility].  Pt denies having a stress test and cardiac cath.  Pt made aware to stop taking  vitamins, fish oil and herbal medications. Do not take any NSAIDs ie: Ibuprofen, Advil, Naproxen (Aleve), Motrin, BC and Goody Powder. Pt reminded to quarantine. Pt verbalized understanding of all pre-op instructions. PA, Anesthesiology, asked to review pt history, see note.

## 2019-10-01 NOTE — Progress Notes (Signed)
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 [name] also being followed by IR for Right ICA aneurysm, unruptured.  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 IR was planning for elective treatment on 08/07/2019. 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 He underwent balloon assisted coiling of this aneurysm. Brilinta was then discontinued. On 08/16/2019 he had a transient episode of worsening aphasia. Repeat head CT showed new punctate infarct in the left posterior temporal region. On 08/19/2019, he underwent a diagnostic cerebral angiogram that showed unimpeded flow in the left MCA branches. There was a suggestion of minimal platelet aggregation at the coil mass-artery interface. Decision was made to place a stent in the left MCA, across the neck of the aneurysm. The procedure went without incident and the patient was discharged home the next day.  Per IR note 09/03/19 he has made a very good recovery and only has minimal persistent numbness of right forearm. [name] now scheduled for elective repair of right ICA aneurysm.   Will need DOS labs and eval.  EKG 08/06/19: Sinus rhythm. Rate 76.  TTE 08/07/19:  1. Left ventricular ejection fraction, by estimation, is 60 to 65%. The  left ventricle has normal function. The left ventricle has no regional  wall motion abnormalities. Left ventricular diastolic parameters are  consistent with Grade II diastolic  dysfunction (pseudonormalization).  2. Right ventricular systolic function is normal. The right ventricular  size is normal.  3. The mitral valve is normal in structure. No evidence of mitral valve  regurgitation. No evidence of mitral stenosis.  4. The aortic valve was not well visualized. Aortic valve regurgitation  is  not visualized. No aortic stenosis is present.  5. Aortic dilatation noted. 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  6. The inferior vena cava is normal in size with greater than 50%  respiratory variability, suggesting right atrial pressure of 3 mmHg.   Zannie Cove [facility] Short Stay [facility]/Anesthesiology Phone [phone] 10/01/2019 2:35 PM

## 2019-10-01 NOTE — Anesthesia Preprocedure Evaluation (Addendum)
Anesthesia Evaluation  Patient identified by MRN, date of birth, ID band Patient awake    Reviewed: Allergy & Precautions, NPO status , Patient's Chart, lab work & pertinent test results  Airway Mallampati: II  TM Distance: >3 FB Neck ROM: Full    Dental  (+) Dental Advisory Given, Teeth Intact   Pulmonary Current Smoker and Patient abstained from smoking.,    Pulmonary exam normal breath sounds clear to auscultation       Cardiovascular negative cardio ROS Normal cardiovascular exam Rhythm:Regular Rate:Normal     Neuro/Psych negative neurological ROS  negative psych ROS   GI/Hepatic negative GI ROS, Neg liver ROS,   Endo/Other  negative endocrine ROS  Renal/GU negative Renal ROS     Musculoskeletal negative musculoskeletal ROS (+)   Abdominal   Peds  Hematology negative hematology ROS (+)   Anesthesia Other Findings   Reproductive/Obstetrics                                                           Anesthesia Evaluation  Patient identified by MRN, date of birth, ID band Patient awake    Reviewed: Allergy & Precautions, NPO status , Patient's Chart, lab work & pertinent test results  Airway Mallampati: II  TM Distance: >3 FB Neck ROM: Full    Dental  (+) Dental Advisory Given, Teeth Intact   Pulmonary Current Smoker,    breath sounds clear to auscultation       Cardiovascular negative cardio ROS   Rhythm:Regular Rate:Normal     Neuro/Psych negative neurological ROS  negative psych ROS   GI/Hepatic negative GI ROS, Neg liver ROS,   Endo/Other  negative endocrine ROS  Renal/GU negative Renal ROS     Musculoskeletal negative musculoskeletal ROS (+)   Abdominal   Peds  Hematology negative hematology ROS (+)   Anesthesia Other Findings   Reproductive/Obstetrics                            Anesthesia Physical Anesthesia  Plan  ASA: III and emergent  Anesthesia Plan: General   Post-op Pain Management:    Induction: Intravenous, Rapid sequence and Cricoid pressure planned  PONV Risk Score and Plan: 1 and Ondansetron, Dexamethasone and Treatment may vary due to age or medical condition  Airway Management Planned: Oral ETT  Additional Equipment: Arterial line  Intra-op Plan:   Post-operative Plan: Possible Post-op intubation/ventilation  Informed Consent: I have reviewed the patients History and Physical, chart, labs and discussed the procedure including the risks, benefits and alternatives for the proposed anesthesia with the patient or authorized representative who has indicated his/her understanding and acceptance.     Dental advisory given  Plan Discussed with: CRNA  Anesthesia Plan Comments: (Pt had sausage biscuit ~11:30. Dr. Sherlon Handing states this is an emergency. )       Anesthesia Quick Evaluation  Anesthesia Physical Anesthesia Plan  ASA: III  Anesthesia Plan: General   Post-op Pain Management:    Induction: Intravenous  PONV Risk Score and Plan: 1 and Ondansetron, Dexamethasone and Treatment may vary due to age or medical condition  Airway Management Planned: Oral ETT  Additional Equipment: Arterial line  Intra-op Plan:   Post-operative Plan: Possible Post-op intubation/ventilation  Informed Consent: I have reviewed  the patients History and Physical, chart, labs and discussed the procedure including the risks, benefits and alternatives for the proposed anesthesia with the patient or authorized representative who has indicated his/her understanding and acceptance.     Dental advisory given  Plan Discussed with: CRNA  Anesthesia Plan Comments: (PAT note by Antionette Poles, PA-C:  Pt is s/p recent Left MCA bifurcation s/p balloon assisted coiling followed by stent placement. [name] also being followed by IR for Right ICA aneurysm, unruptured.  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 IR was planning for elective treatment on 08/07/2019. 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 He underwent balloon assisted coiling of this aneurysm. Brilinta was then discontinued. On 08/16/2019 he had a transient episode of worsening aphasia. Repeat head CT showed new punctate infarct in the left posterior temporal region. On 08/19/2019, he underwent a diagnostic cerebral angiogram that showed unimpeded flow in the left MCA branches. There was a suggestion of minimal platelet aggregation at the coil mass-artery interface. Decision was made to place a stent in the left MCA, across the neck of the aneurysm. The procedure went without incident and the patient was discharged home the next day.  Per IR note 09/03/19 he has made a very good recovery and only has minimal persistent numbness of right forearm. [name] now scheduled for elective repair of right ICA aneurysm.   Will need DOS labs and eval.  EKG 08/06/19: Sinus rhythm. Rate 76.  TTE 08/07/19:  1. Left ventricular ejection fraction, by estimation, is 60 to 65%. The  left ventricle has normal function. The left ventricle has no regional  wall motion abnormalities. Left ventricular diastolic parameters are  consistent with Grade II diastolic  dysfunction (pseudonormalization).  2. Right ventricular systolic function is normal. The right ventricular  size is normal.  3. The mitral valve is normal in structure. No evidence of mitral valve  regurgitation. No evidence of mitral stenosis.  4. The aortic valve was not well visualized. Aortic valve regurgitation  is not visualized. No aortic stenosis is present.  5. Aortic dilatation noted. 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  6. The inferior vena cava is normal in size with greater than 50%  respiratory  variability, suggesting right atrial pressure of 3 mmHg.   )     Anesthesia Quick Evaluation

## 2019-10-02 ENCOUNTER — Ambulatory Visit (HOSPITAL_COMMUNITY)
Admission: RE | Admit: 2019-10-02 | Discharge: 2019-10-02 | Disposition: A | Discharge status: Home / Self Care | Payer: Managed Care, Other (non HMO) | Source: Ambulatory Visit | Attending: Neuroradiology | Admitting: Neuroradiology

## 2019-10-02 ENCOUNTER — Ambulatory Visit (HOSPITAL_COMMUNITY): Payer: Managed Care, Other (non HMO) | Admitting: Physician Assistant

## 2019-10-02 ENCOUNTER — Encounter (HOSPITAL_COMMUNITY): Payer: Self-pay

## 2019-10-02 ENCOUNTER — Other Ambulatory Visit: Payer: Self-pay

## 2019-10-02 ENCOUNTER — Observation Stay (HOSPITAL_COMMUNITY)
Admission: RE | Admit: 2019-10-02 | Discharge: 2019-10-03 | Disposition: A | Discharge status: Home / Self Care | Payer: Managed Care, Other (non HMO) | Attending: Neuroradiology | Admitting: Neuroradiology

## 2019-10-02 ENCOUNTER — Encounter (HOSPITAL_COMMUNITY)
Admission: RE | Disposition: A | Discharge status: Home / Self Care | Payer: Self-pay | Source: Home / Self Care | Attending: Neuroradiology

## 2019-10-02 DIAGNOSIS — H532 Diplopia: Secondary | ICD-10-CM | POA: Insufficient documentation

## 2019-10-02 DIAGNOSIS — I671 Cerebral aneurysm, nonruptured: Secondary | ICD-10-CM

## 2019-10-02 DIAGNOSIS — Z8249 Family history of ischemic heart disease and other diseases of the circulatory system: Secondary | ICD-10-CM | POA: Insufficient documentation

## 2019-10-02 DIAGNOSIS — F17210 Nicotine dependence, cigarettes, uncomplicated: Secondary | ICD-10-CM | POA: Diagnosis not present

## 2019-10-02 DIAGNOSIS — Z7982 Long term (current) use of aspirin: Secondary | ICD-10-CM | POA: Insufficient documentation

## 2019-10-02 HISTORY — PX: RADIOLOGY WITH ANESTHESIA: SHX6223

## 2019-10-02 HISTORY — DX: Other seasonal allergic rhinitis: J30.2

## 2019-10-02 HISTORY — DX: Cerebral aneurysm, nonruptured: I67.1

## 2019-10-02 HISTORY — DX: Presence of spectacles and contact lenses: Z97.3

## 2019-10-02 LAB — CBC WITH DIFFERENTIAL/PLATELET
Abs Immature Granulocytes: 0.02 10*3/uL (ref 0.00–0.07)
Basophils Absolute: 0 10*3/uL (ref 0.0–0.1)
Basophils Relative: 1 %
Eosinophils Absolute: 0.2 10*3/uL (ref 0.0–0.5)
Eosinophils Relative: 4 %
HCT: 41.5 % (ref 39.0–52.0)
Hemoglobin: 13.3 g/dL (ref 13.0–17.0)
Immature Granulocytes: 0 %
Lymphocytes Relative: 23 %
Lymphs Abs: 1.3 10*3/uL (ref 0.7–4.0)
MCH: 33.1 pg (ref 26.0–34.0)
MCHC: 32 g/dL (ref 30.0–36.0)
MCV: 103.2 fL — ABNORMAL HIGH (ref 80.0–100.0)
Monocytes Absolute: 0.7 10*3/uL (ref 0.1–1.0)
Monocytes Relative: 13 %
Neutro Abs: 3.3 10*3/uL (ref 1.7–7.7)
Neutrophils Relative %: 59 %
Platelets: 294 10*3/uL (ref 150–400)
RBC: 4.02 MIL/uL — ABNORMAL LOW (ref 4.22–5.81)
RDW: 13.8 % (ref 11.5–15.5)
WBC: 5.6 10*3/uL (ref 4.0–10.5)
nRBC: 0 % (ref 0.0–0.2)

## 2019-10-02 LAB — BASIC METABOLIC PANEL
Anion gap: 9 (ref 5–15)
BUN: 11 mg/dL (ref 8–23)
CO2: 23 mmol/L (ref 22–32)
Calcium: 9.4 mg/dL (ref 8.9–10.3)
Chloride: 107 mmol/L (ref 98–111)
Creatinine, Ser: 0.9 mg/dL (ref 0.61–1.24)
GFR calc Af Amer: >60 mL/min (ref >60)
GFR calc non Af Amer: >60 mL/min (ref >60)
Glucose, Bld: 97 mg/dL (ref 70–99)
Potassium: 4.2 mmol/L (ref 3.5–5.1)
Sodium: 139 mmol/L (ref 135–145)

## 2019-10-02 LAB — POCT ACTIVATED CLOTTING TIME
Activated Clotting Time: 125 seconds
Activated Clotting Time: 169 seconds
Activated Clotting Time: 186 seconds
Activated Clotting Time: 197 seconds
Activated Clotting Time: 230 seconds
Activated Clotting Time: 230 seconds

## 2019-10-02 LAB — PROTIME-INR
INR: 1 (ref 0.8–1.2)
Prothrombin Time: 12.3 seconds (ref 11.4–15.2)

## 2019-10-02 LAB — MRSA PCR SCREENING: MRSA by PCR: NEGATIVE

## 2019-10-02 SURGERY — RADIOLOGY WITH ANESTHESIA
Anesthesia: General

## 2019-10-02 MED ORDER — FENTANYL CITRATE (PF) 100 MCG/2ML IJ SOLN: 25.0000 ug | INTRAMUSCULAR | Status: DC | PRN

## 2019-10-02 MED ORDER — DEXAMETHASONE SODIUM PHOSPHATE 10 MG/ML IJ SOLN
INTRAMUSCULAR | Status: DC | PRN
  Administered 2019-10-02: 5 mg via INTRAVENOUS

## 2019-10-02 MED ORDER — CHLORHEXIDINE GLUCONATE 0.12 % MT SOLN
OROMUCOSAL | Status: AC
  Administered 2019-10-02: 15 mL via OROMUCOSAL
  Filled 2019-10-02: qty 15

## 2019-10-02 MED ORDER — TICAGRELOR 90 MG PO TABS
90.0000 mg | ORAL_TABLET | Freq: Two times a day (BID) | ORAL | Status: DC
  Administered 2019-10-02 – 2019-10-03 (×2): 90 mg via ORAL
  Filled 2019-10-02 (×2): qty 1

## 2019-10-02 MED ORDER — MEPERIDINE HCL 25 MG/ML IJ SOLN: 6.2500 mg | INTRAMUSCULAR | Status: DC | PRN

## 2019-10-02 MED ORDER — TICAGRELOR 90 MG PO TABS: 90.0000 mg | ORAL_TABLET | Freq: Two times a day (BID) | ORAL | Status: DC

## 2019-10-02 MED ORDER — LACTATED RINGERS IV SOLN: INTRAVENOUS | Status: DC

## 2019-10-02 MED ORDER — LIDOCAINE HCL 1 % IJ SOLN
INTRAMUSCULAR | Status: AC
  Filled 2019-10-02: qty 20

## 2019-10-02 MED ORDER — NITROGLYCERIN 1 MG/10 ML FOR IR/CATH LAB
INTRA_ARTERIAL | Status: DC | PRN
  Administered 2019-10-02: 200 ug via INTRA_ARTERIAL

## 2019-10-02 MED ORDER — HEPARIN SODIUM (PORCINE) 1000 UNIT/ML IJ SOLN
INTRAMUSCULAR | Status: DC | PRN
  Administered 2019-10-02: 5000 [IU] via INTRAVENOUS

## 2019-10-02 MED ORDER — ACETAMINOPHEN 160 MG/5ML PO SOLN: 650.0000 mg | ORAL | Status: DC | PRN

## 2019-10-02 MED ORDER — SUGAMMADEX SODIUM 200 MG/2ML IV SOLN
INTRAVENOUS | Status: DC | PRN
  Administered 2019-10-02: 400 mg via INTRAVENOUS

## 2019-10-02 MED ORDER — CHLORHEXIDINE GLUCONATE 0.12 % MT SOLN: 15.0000 mL | Freq: Once | OROMUCOSAL | Status: AC

## 2019-10-02 MED ORDER — FENTANYL CITRATE (PF) 100 MCG/2ML IJ SOLN
25.0000 ug | INTRAMUSCULAR | Status: DC | PRN
Start: 2019-10-02 — End: 2019-10-02

## 2019-10-02 MED ORDER — CEFAZOLIN SODIUM-DEXTROSE 2-4 GM/100ML-% IV SOLN
2.0000 g | INTRAVENOUS | Status: AC
  Administered 2019-10-02: 2 g via INTRAVENOUS
  Filled 2019-10-02: qty 100

## 2019-10-02 MED ORDER — MEPERIDINE HCL 25 MG/ML IJ SOLN
6.2500 mg | INTRAMUSCULAR | Status: DC | PRN
Start: 2019-10-02 — End: 2019-10-02

## 2019-10-02 MED ORDER — ONDANSETRON HCL 4 MG/2ML IJ SOLN
INTRAMUSCULAR | Status: DC | PRN
  Administered 2019-10-02: 4 mg via INTRAVENOUS

## 2019-10-02 MED ORDER — ASPIRIN EC 325 MG PO TBEC
325.0000 mg | DELAYED_RELEASE_TABLET | ORAL | Status: DC
  Filled 2019-10-02: qty 1

## 2019-10-02 MED ORDER — ONDANSETRON HCL 4 MG/2ML IJ SOLN: 4.0000 mg | Freq: Once | INTRAMUSCULAR | Status: DC | PRN

## 2019-10-02 MED ORDER — FENTANYL CITRATE (PF) 250 MCG/5ML IJ SOLN
INTRAMUSCULAR | Status: DC | PRN
  Administered 2019-10-02: 25 ug via INTRAVENOUS
  Administered 2019-10-02 (×2): 50 ug via INTRAVENOUS
  Administered 2019-10-02: 25 ug via INTRAVENOUS

## 2019-10-02 MED ORDER — VERAPAMIL HCL 2.5 MG/ML IV SOLN
INTRAVENOUS | Status: DC | PRN
  Administered 2019-10-02: 5 mg via INTRAVENOUS

## 2019-10-02 MED ORDER — SODIUM CHLORIDE 0.9 % IV SOLN: INTRAVENOUS | Status: DC

## 2019-10-02 MED ORDER — LIDOCAINE 2% (20 MG/ML) 5 ML SYRINGE
INTRAMUSCULAR | Status: DC | PRN
  Administered 2019-10-02: 100 mg via INTRAVENOUS

## 2019-10-02 MED ORDER — HEPARIN SODIUM (PORCINE) 1000 UNIT/ML IJ SOLN
INTRAMUSCULAR | Status: DC | PRN
Start: 2019-10-02 — End: 2019-10-02
  Administered 2019-10-02 (×2): 2000 [IU] via INTRAVENOUS
  Administered 2019-10-02: 1000 [IU] via INTRAVENOUS

## 2019-10-02 MED ORDER — PROPOFOL 10 MG/ML IV BOLUS
INTRAVENOUS | Status: DC | PRN
  Administered 2019-10-02: 20 mg via INTRAVENOUS
  Administered 2019-10-02: 150 mg via INTRAVENOUS
  Administered 2019-10-02: 20 mg via INTRAVENOUS

## 2019-10-02 MED ORDER — PHENYLEPHRINE 40 MCG/ML (10ML) SYRINGE FOR IV PUSH (FOR BLOOD PRESSURE SUPPORT)
PREFILLED_SYRINGE | INTRAVENOUS | Status: DC | PRN
  Administered 2019-10-02 (×6): 40 ug via INTRAVENOUS

## 2019-10-02 MED ORDER — ASPIRIN 81 MG PO CHEW
81.0000 mg | CHEWABLE_TABLET | Freq: Every day | ORAL | Status: DC
  Administered 2019-10-03: 81 mg

## 2019-10-02 MED ORDER — ONDANSETRON HCL 4 MG/2ML IJ SOLN
4.0000 mg | Freq: Once | INTRAMUSCULAR | Status: DC | PRN
Start: 2019-10-02 — End: 2019-10-02

## 2019-10-02 MED ORDER — ONDANSETRON HCL 4 MG/2ML IJ SOLN: 4.0000 mg | Freq: Four times a day (QID) | INTRAMUSCULAR | Status: DC | PRN

## 2019-10-02 MED ORDER — ACETAMINOPHEN 650 MG RE SUPP: 650.0000 mg | RECTAL | Status: DC | PRN

## 2019-10-02 MED ORDER — VERAPAMIL HCL 2.5 MG/ML IV SOLN
INTRAVENOUS | Status: AC
  Filled 2019-10-02: qty 2

## 2019-10-02 MED ORDER — LIDOCAINE HCL 1 % IJ SOLN
INTRAMUSCULAR | Status: DC | PRN
  Administered 2019-10-02: 1 mL

## 2019-10-02 MED ORDER — IOHEXOL 240 MG/ML SOLN
150.0000 mL | Freq: Once | INTRAMUSCULAR | Status: AC | PRN
  Administered 2019-10-02: 45 mL via INTRA_ARTERIAL

## 2019-10-02 MED ORDER — ASPIRIN 81 MG PO CHEW
81.0000 mg | CHEWABLE_TABLET | Freq: Every day | ORAL | Status: DC
  Filled 2019-10-02: qty 1

## 2019-10-02 MED ORDER — CHLORHEXIDINE GLUCONATE CLOTH 2 % EX PADS
6.0000 | MEDICATED_PAD | Freq: Every day | CUTANEOUS | Status: DC
  Administered 2019-10-02: 6 via TOPICAL

## 2019-10-02 MED ORDER — NITROGLYCERIN 1 MG/10 ML FOR IR/CATH LAB
INTRA_ARTERIAL | Status: DC | PRN
  Administered 2019-10-02: 500 ug via INTRA_ARTERIAL

## 2019-10-02 MED ORDER — PHENYLEPHRINE HCL-NACL 10-0.9 MG/250ML-% IV SOLN
INTRAVENOUS | Status: DC | PRN
Start: 2019-10-02 — End: 2019-10-02
  Administered 2019-10-02: 20 ug/min via INTRAVENOUS

## 2019-10-02 MED ORDER — NICOTINE 14 MG/24HR TD PT24
14.0000 mg | MEDICATED_PATCH | Freq: Every day | TRANSDERMAL | Status: DC
  Administered 2019-10-02 – 2019-10-03 (×2): 14 mg via TRANSDERMAL
  Filled 2019-10-02 (×2): qty 1

## 2019-10-02 MED ORDER — ROCURONIUM BROMIDE 10 MG/ML (PF) SYRINGE
PREFILLED_SYRINGE | INTRAVENOUS | Status: DC | PRN
  Administered 2019-10-02: 10 mg via INTRAVENOUS
  Administered 2019-10-02 (×2): 20 mg via INTRAVENOUS
  Administered 2019-10-02: 10 mg via INTRAVENOUS
  Administered 2019-10-02: 45 mg via INTRAVENOUS
  Administered 2019-10-02: 10 mg via INTRAVENOUS
  Administered 2019-10-02: 30 mg via INTRAVENOUS

## 2019-10-02 MED ORDER — ACETAMINOPHEN 325 MG PO TABS
650.0000 mg | ORAL_TABLET | ORAL | Status: DC | PRN
  Administered 2019-10-02 – 2019-10-03 (×2): 650 mg via ORAL
  Filled 2019-10-02 (×2): qty 2

## 2019-10-02 MED ORDER — SODIUM CHLORIDE 0.9 % IV SOLN
INTRAVENOUS | Status: DC | PRN
Start: 2019-10-02 — End: 2019-10-02

## 2019-10-02 MED ORDER — CLEVIDIPINE BUTYRATE 0.5 MG/ML IV EMUL: 0.0000 mg/h | INTRAVENOUS | Status: DC

## 2019-10-02 MED ORDER — HEPARIN SODIUM (PORCINE) 1000 UNIT/ML IJ SOLN
INTRAMUSCULAR | Status: AC
  Filled 2019-10-02: qty 1

## 2019-10-02 MED ORDER — ESMOLOL HCL 100 MG/10ML IV SOLN
INTRAVENOUS | Status: DC | PRN
Start: 2019-10-02 — End: 2019-10-02
  Administered 2019-10-02: 20 mg via INTRAVENOUS

## 2019-10-02 MED ORDER — ORAL CARE MOUTH RINSE: 15.0000 mL | Freq: Once | OROMUCOSAL | Status: AC

## 2019-10-02 MED ORDER — CLEVIDIPINE BUTYRATE 0.5 MG/ML IV EMUL
INTRAVENOUS | Status: AC
  Administered 2019-10-02: 2 mg/h
  Filled 2019-10-02: qty 50

## 2019-10-02 MED ORDER — CLEVIDIPINE BUTYRATE 0.5 MG/ML IV EMUL
0.0000 mg/h | INTRAVENOUS | Status: DC
  Administered 2019-10-02: 8 mg/h via INTRAVENOUS

## 2019-10-02 MED ORDER — IOHEXOL 240 MG/ML SOLN
INTRAMUSCULAR | Status: AC
  Filled 2019-10-02: qty 200

## 2019-10-02 MED ORDER — NITROGLYCERIN 1 MG/10 ML FOR IR/CATH LAB
INTRA_ARTERIAL | Status: AC
  Filled 2019-10-02: qty 10

## 2019-10-02 MED ORDER — NIMODIPINE 30 MG PO CAPS: 0.0000 mg | ORAL_CAPSULE | ORAL | Status: DC

## 2019-10-02 NOTE — Progress Notes (Signed)
NIR.  History of right ICA ophthalmic aneurysm s/p embolization using a pipeline shield device via right radial approach 10/02/2019 by Dr. Tommie Sams.  Went to evaluate patient bedside alongside Dr. Tommie Sams. Patient awake and alert sitting in bed with no complaints at this time.  Alert, awake, and oriented x3. Speech and comprehension intact. PERRL bilaterally. No facial asymmetry. Tongue midline. Can spontaneously move all extremities. Grip strength equal bilaterally. No pronator drift. Distal pulses (radial) 2+ bilaterally. Right radial incision soft with TR band in place, no active bleeding or hematoma noted.  Plan to stay in neuro ICU for overnight observation. Advance diet as tolerated. Continue taking Brilinta 90 mg twice daily and Aspirin 81 mg once daily- will obtain P2Y12 tomorrow AM (prior to Brilinta dosing) to determine DAPT on discharge. NIR to follow.   Victor Boga Hassen Bruun, PA-C 10/02/2019, 3:27 PM

## 2019-10-02 NOTE — Anesthesia Procedure Notes (Signed)
Procedure Name: Intubation Date/Time: 10/02/2019 9:02 AM Performed by: Lanell Matar, CRNA Pre-anesthesia Checklist: Patient identified, Emergency Drugs available, Suction available and Patient being monitored Patient Re-evaluated:Patient Re-evaluated prior to induction Oxygen Delivery Method: Circle System Utilized Preoxygenation: Pre-oxygenation with 100% oxygen Induction Type: IV induction Ventilation: Mask ventilation without difficulty Laryngoscope Size: Miller and 2 Grade View: Grade II Tube type: Oral Tube size: 7.5 mm Number of attempts: 1 Airway Equipment and Method: Stylet and Oral airway Placement Confirmation: ETT inserted through vocal cords under direct vision,  positive ETCO2 and breath sounds checked- equal and bilateral Secured at: 22 cm Tube secured with: Tape Dental Injury: Teeth and Oropharynx as per pre-operative assessment

## 2019-10-02 NOTE — Procedures (Addendum)
INTERVENTIONAL NEURORADIOLOGY BRIEF POSTPROCEDURE NOTE  DIAGNOSTIC CEREBRAL ANGIOGRAM  AND ENDOVASCULAR EMBOLIZATION  Attending: Dr. Baldemar Lenis  Assistant: None  Diagnosis: Right ICA aneurysm  Access site: Distal right radial artery  Access closure: Inflatable band  Anesthesia: General   Medication used: 10000 IU heparin IV   Complications: None  Estimated blood loss: None  Specimen: None  Findings: A 5 mm right ICA ophthalmic segment wide neck aneurysm with a daughter sac. Placement of a 4.25 x 14 mm Pipeline shield device across the neck of the aneurysm.   The patient tolerated the procedure well without incident or complication and is in stable condition.    Wife communicated by phone after the procedure.

## 2019-10-02 NOTE — H&P (Signed)
Chief Complaint: Patient was seen in consultation today for cerebral arteriogram with possible right internal carotid artery aneurysm embolization at the request of Dr Agnes Lawrence   Supervising Physician: Baldemar Lenis  Patient Status: [facility] - Out-pt  History of Present Illness: Victor Pena is a 64 y.o. 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 Image-guided cerebral arteriogram with staged embolization of ruptured left MCA bifurcation using primary coiling via right femoral artery 08/06/2019 by Dr. Tommie Sams 6/1/note:   He underwent balloon assisted coiling of this aneurysm without incident or complication. Brilinta was then discontinued. He had an uneventful recovery while admitted for vasospasm watch until 08/16/2019, when he had a transient episode of worsening aphasia. Repeat head CT showed new punctate infarct in the left posterior temporal region. This may have been related to microvascular infarct versus platelet aggregation at the coil mass interface given wide neck. He was restarted on Brilinta at that time. On 08/19/2019, he underwent a diagnostic cerebral angiogram that showed unimpeded flow in the left MCA branches. There was a suggestion of minimal platelet aggregation at the coil mass-artery interface. Decision was made to place a stent in the left MCA, across the neck of the aneurysm. The procedure went without incident and the patient was discharged home the next day.  Here today and Scheduled now for cerebral arteriogram with R Internal carotid artery aneurysm embolization  Taking ASA/Brilinta Pt is doing well Denies N/V Denies headache Denies speech or vision changes Denies numbness or tingling    Past Medical History:  Diagnosis Date  . Cerebral aneurysm   . Diplopia   . Intracranial aneurysm   . Seasonal allergies   . Wears glasses     Past Surgical History:  Procedure Laterality Date  . BACK SURGERY      . COLONOSCOPY    . IR 3D INDEPENDENT WKST  07/19/2019  . IR 3D INDEPENDENT WKST  07/19/2019  . IR 3D INDEPENDENT WKST  08/06/2019  . IR 3D INDEPENDENT WKST  08/19/2019  . IR ANGIO EXTERNAL CAROTID SEL EXT CAROTID BILAT MOD SED  07/19/2019  . IR ANGIO INTRA EXTRACRAN SEL INTERNAL CAROTID BILAT MOD SED  07/19/2019  . IR ANGIO VERTEBRAL SEL VERTEBRAL BILAT MOD SED  07/19/2019  . IR ANGIOGRAM FOLLOW UP STUDY  08/06/2019  . IR ANGIOGRAM FOLLOW UP STUDY  08/06/2019  . IR ANGIOGRAM FOLLOW UP STUDY  08/06/2019  . IR ANGIOGRAM FOLLOW UP STUDY  08/06/2019  . IR ANGIOGRAM FOLLOW UP STUDY  08/06/2019  . IR ANGIOGRAM FOLLOW UP STUDY  08/06/2019  . IR CT HEAD LTD  08/06/2019  . IR CT HEAD LTD  08/19/2019  . IR INTRA CRAN STENT  08/19/2019  . IR NEURO EACH ADD'L AFTER BASIC UNI LEFT (MS)  08/06/2019  . IR TRANSCATH/EMBOLIZ  08/06/2019  . IR US GUIDE VASC ACCESS RIGHT  07/19/2019  . IR US GUIDE VASC ACCESS RIGHT  08/06/2019  . IR US GUIDE VASC ACCESS RIGHT  08/19/2019  . RADIOLOGY WITH ANESTHESIA N/A 08/06/2019   Procedure: IR WITH ANESTHESIA;  Surgeon: Radiologist, Medication, MD;  Location: [facility] OR;  Service: Radiology;  Laterality: N/A;  . RADIOLOGY WITH ANESTHESIA N/A 08/19/2019   Procedure: STENT PLACEMENT;  Surgeon: Baldemar Lenis, MD;  Location: [facility] OR;  Service: Radiology;  Laterality: N/A;  . WISDOM TOOTH EXTRACTION      Allergies: Other  Medications: Prior to Admission medications  Medication Sig Start Date End Date Taking? Authorizing Provider  acetaminophen (TYLENOL) 500 MG tablet Take 1,000 mg by mouth every 6 (six) hours as needed for mild pain or headache.    [provider]  aspirin 81 MG EC tablet Take 81 mg by mouth daily. Swallow whole.    [provider]  calcipotriene-betamethasone [facility](TACLONEX) external suspension Apply topically daily. Patient taking differently: Apply 1 application topically daily as needed (on hands).  09/05/19   Clark-Bruning, Victorino DikeJennifer, PA-C  nicotine  (NICODERM CQ - DOSED IN MG/24 HOURS) 14 mg/24hr patch Place 1 patch (14 mg total) onto the skin daily. Patient taking differently: Place 14 mg onto the skin daily as needed (nicotine).  08/21/19   Louk, Waylan BogaAlexandra M, PA-C  ticagrelor (BRILINTA) 90 MG TABS tablet Take 90 mg by mouth daily.     [provider]     Family History  Problem Relation Age of Onset  . Aneurysm Father     Social History   Socioeconomic History  . Marital status: Married    Spouse name: Not on file  . Number of children: 4  . Years of education: 8316  . Highest education level: Not on file  Occupational History  . Occupation: owner   Tobacco Use  . Smoking status: Current Every Day Smoker    Packs/day: 0.25    Types: Cigarettes  . Smokeless tobacco: Never Used  Vaping Use  . Vaping Use: Never used  Substance and Sexual Activity  . Alcohol use: Yes    Alcohol/week: 10.0 standard drinks    Types: 10 Glasses of wine per week    Comment: one to two glasses or wine per night  . Drug use: Not Currently  . Sexual activity: Not on file  Other Topics Concern  . Not on file  Social History Narrative   Right handed   3 story home   Drinks caffeine   Social Determinants of Health   Financial Resource Strain:   . Difficulty of Paying Living Expenses:   Food Insecurity:   . Worried About Programme researcher, broadcasting/film/videounning Out of Food in the Last Year:   . Baristaan Out of Food in the Last Year:   Transportation Needs:   . Freight forwarderLack of Transportation (Medical):   Marland Kitchen. Lack of Transportation (Non-Medical):   Physical Activity:   . Days of Exercise per Week:   . Minutes of Exercise per Session:   Stress:   . Feeling of Stress :   Social Connections:   . Frequency of Communication with Friends and Family:   . Frequency of Social Gatherings with Friends and Family:   . Attends Religious Services:   . Active Member of Clubs or Organizations:   . Attends BankerClub or Organization Meetings:   Marland Kitchen. Marital Status:     Review of Systems: A 12  point ROS discussed and pertinent positives are indicated in the HPI above.  All other systems are negative.  Review of Systems  Constitutional: Negative for activity change, fatigue and fever.  HENT: Negative for tinnitus, trouble swallowing and voice change.   Eyes: Negative for visual disturbance.  Respiratory: Negative for cough and shortness of breath.   Cardiovascular: Negative for chest pain.  Gastrointestinal: Negative for abdominal pain, nausea and vomiting.  Musculoskeletal: Negative for back pain.  Neurological: Negative for dizziness, tremors, seizures, syncope, facial asymmetry, speech difficulty, weakness, light-headedness, numbness and headaches.  Psychiatric/Behavioral: Negative for behavioral problems and confusion.    Vital Signs: There were no vitals  taken for this visit.  Physical Exam Vitals reviewed.  HENT:     Mouth/Throat:     Mouth: Mucous membranes are moist.  Eyes:     Extraocular Movements: Extraocular movements intact.  Cardiovascular:     Rate and Rhythm: Normal rate and regular rhythm.     Heart sounds: Normal heart sounds.  Pulmonary:     Effort: Pulmonary effort is normal.     Breath sounds: Normal breath sounds.  Abdominal:     General: There is no distension.     Palpations: Abdomen is soft.     Tenderness: There is no abdominal tenderness.  Musculoskeletal:        General: No swelling, tenderness, deformity or signs of injury. Normal range of motion.     Right lower leg: No edema.     Left lower leg: No edema.  Skin:    General: Skin is warm.  Neurological:     Mental Status: [name] alert and oriented to person, place, and time.  Psychiatric:        Behavior: Behavior normal.     Imaging: No results found.  Labs:  CBC: Recent Labs    08/10/19 0559 08/10/19 0559 08/11/19 0628 08/15/19 0417 08/19/19 0320 10/02/19 0637  WBC 7.2  --  7.5  --  10.2 5.6  HGB 13.0  --  13.6  --  12.8* 13.3  HCT 38.4*   < > 39.9 36.7* 38.2*  41.5  PLT 296  --  338  --  431* 294   < > = values in this interval not displayed.    COAGS: Recent Labs    07/15/19 1344 08/06/19 1217 08/06/19 1244 10/02/19 0637  INR 1.0 1.0 1.0 1.0  APTT 29 27 28   --     BMP: Recent Labs    08/10/19 0559 08/11/19 0628 08/19/19 0320 10/02/19 0637  NA 139 140 139 139  K 4.2 3.7 3.8 4.2  CL 105 108 109 107  CO2 25 21* 21* 23  GLUCOSE 101* 99 104* 97  BUN 20 15 17 11   CALCIUM 9.2 9.2 9.3 9.4  CREATININE 0.96 0.73 0.93 0.90  GFRNONAA >60 >60 >60 >60  GFRAA >60 >60 >60 >60    LIVER FUNCTION TESTS: Recent Labs    08/06/19 1217  BILITOT 0.9  AST 49*  ALT 46*  ALKPHOS 60  PROT 7.4  ALBUMIN 4.0    TUMOR MARKERS: No results for input(s): AFPTM, CEA, CA199, CHROMGRNA in the last 8760 hours.  Assessment and Plan:  Scheduled for cerebral arteriogram with right internal carotid artery aneurysm embolization in IR Risks and benefits of cerebral angiogram with intervention were discussed with the patient including, but not limited to bleeding, infection, vascular injury, contrast induced renal failure, stroke or even death.  This interventional procedure involves the use of X-rays and because of the nature of the planned procedure, it is possible that we will have prolonged use of X-ray fluoroscopy.  Potential radiation risks to you include (but are not limited to) the following: - A slightly elevated risk for cancer  several years later in life. This risk is typically less than 0.5% percent. This risk is low in comparison to the normal incidence of human cancer, which is 33% for women and 50% for men according to the American [employer] Society. - Radiation induced injury can include skin redness, resembling a rash, tissue breakdown / ulcers and hair loss (which can be temporary or permanent).   The  likelihood of either of these occurring depends on the difficulty of the procedure and whether you are sensitive to radiation due to  previous procedures, disease, or genetic conditions.   IF your procedure requires a prolonged use of radiation, you will be notified and given written instructions for further action.  It is your responsibility to monitor the irradiated area for the 2 weeks following the procedure and to notify your physician if you are concerned that you have suffered a radiation induced injury.    All of the patient's questions were answered, patient is agreeable to proceed.  Consent signed and in chart.  Pt is aware, if intervention is performed, he will be admitted overnight into Neuro ICU.Plan for DC in am [name] agreeable  Thank you for this interesting consult.  I greatly enjoyed meeting JONAVAN VANHORN and look forward to participating in their care.  A copy of this report was sent to the requesting provider on this date.  Electronically Signed: Robet Leu, PA-C 10/02/2019, 8:17 AM   I spent a total of    25 Minutes in face to face in clinical consultation, greater than 50% of which was counseling/coordinating care for R ICA aneurysm embolization

## 2019-10-02 NOTE — Anesthesia Procedure Notes (Signed)
Arterial Line Insertion Start/End6/30/2021 7:45 AM, 10/02/2019 8:00 AM Performed by: Lanell Matar, CRNA, CRNA  Preanesthetic checklist: patient identified, IV checked, site marked, risks and benefits discussed, surgical consent, monitors and equipment checked, pre-op evaluation, timeout performed and anesthesia consent Lidocaine 1% used for infiltration Left, radial was placed Catheter size: 20 G Hand hygiene performed  and maximum sterile barriers used   Attempts: 2 Procedure performed without using ultrasound guided technique. Following insertion, dressing applied and Biopatch. Post procedure assessment: normal  Patient tolerated the procedure well with no immediate complications.

## 2019-10-02 NOTE — Transfer of Care (Signed)
Immediate Anesthesia Transfer of Care Note  Patient: Victor Pena  Procedure(s) Performed: RADIOLOGY WITH ANESTHESIA  EMBOLIZATION (N/A )  Patient Location: PACU  Anesthesia Type:General  Level of Consciousness: awake, alert  and oriented  Airway & Oxygen Therapy: Patient Spontanous Breathing and Patient connected to nasal cannula oxygen  Post-op Assessment: Report given to RN, Post -op Vital signs reviewed and stable and Patient moving all extremities X 4  Post vital signs: Reviewed and stable  Last Vitals:  Vitals Value Taken Time  BP 122/68 10/02/19 1154  Temp    Pulse 73 10/02/19 1159  Resp 12 10/02/19 1159  SpO2 99 % 10/02/19 1159  Vitals shown include unvalidated device data.  Last Pain:  Vitals:   10/02/19 0703  PainSc: 0-No pain      Patients Stated Pain Goal: 6 (10/02/19 0703)  Complications: No complications documented.

## 2019-10-02 NOTE — H&P (Deleted)
  The note originally documented on this encounter has been moved the the encounter in which it belongs.  

## 2019-10-02 NOTE — Anesthesia Postprocedure Evaluation (Signed)
Anesthesia Post Note  Patient: Victor Pena  Procedure(s) Performed: RADIOLOGY WITH ANESTHESIA  EMBOLIZATION (N/A )     Patient location during evaluation: PACU Anesthesia Type: General Level of consciousness: sedated and patient cooperative Pain management: pain level controlled Vital Signs Assessment: post-procedure vital signs reviewed and stable Respiratory status: spontaneous breathing Cardiovascular status: stable Anesthetic complications: no   No complications documented.  Last Vitals:  Vitals:   10/02/19 1430 10/02/19 1445  BP: 123/65   Pulse:  78  Resp: 17 19  Temp:    SpO2:  97%    Last Pain:  Vitals:   10/02/19 1400  TempSrc: Oral  PainSc: 0-No pain                 Lewie Loron

## 2019-10-03 ENCOUNTER — Encounter (HOSPITAL_COMMUNITY): Payer: Self-pay | Admitting: Neuroradiology

## 2019-10-03 ENCOUNTER — Other Ambulatory Visit: Payer: Self-pay | Admitting: Student

## 2019-10-03 DIAGNOSIS — I671 Cerebral aneurysm, nonruptured: Secondary | ICD-10-CM | POA: Diagnosis not present

## 2019-10-03 LAB — CBC WITH DIFFERENTIAL/PLATELET
Abs Immature Granulocytes: 0.03 10*3/uL (ref 0.00–0.07)
Basophils Absolute: 0 10*3/uL (ref 0.0–0.1)
Basophils Relative: 0 %
Eosinophils Absolute: 0 10*3/uL (ref 0.0–0.5)
Eosinophils Relative: 0 %
HCT: 36.1 % — ABNORMAL LOW (ref 39.0–52.0)
Hemoglobin: 12 g/dL — ABNORMAL LOW (ref 13.0–17.0)
Immature Granulocytes: 0 %
Lymphocytes Relative: 11 %
Lymphs Abs: 0.9 10*3/uL (ref 0.7–4.0)
MCH: 34.1 pg — ABNORMAL HIGH (ref 26.0–34.0)
MCHC: 33.2 g/dL (ref 30.0–36.0)
MCV: 102.6 fL — ABNORMAL HIGH (ref 80.0–100.0)
Monocytes Absolute: 1.2 10*3/uL — ABNORMAL HIGH (ref 0.1–1.0)
Monocytes Relative: 14 %
Neutro Abs: 6.2 10*3/uL (ref 1.7–7.7)
Neutrophils Relative %: 75 %
Platelets: 256 10*3/uL (ref 150–400)
RBC: 3.52 MIL/uL — ABNORMAL LOW (ref 4.22–5.81)
RDW: 13.7 % (ref 11.5–15.5)
WBC: 8.3 10*3/uL (ref 4.0–10.5)
nRBC: 0 % (ref 0.0–0.2)

## 2019-10-03 LAB — BASIC METABOLIC PANEL
Anion gap: 8 (ref 5–15)
BUN: 13 mg/dL (ref 8–23)
CO2: 22 mmol/L (ref 22–32)
Calcium: 8.4 mg/dL — ABNORMAL LOW (ref 8.9–10.3)
Chloride: 107 mmol/L (ref 98–111)
Creatinine, Ser: 0.85 mg/dL (ref 0.61–1.24)
GFR calc Af Amer: >60 mL/min (ref >60)
GFR calc non Af Amer: >60 mL/min (ref >60)
Glucose, Bld: 105 mg/dL — ABNORMAL HIGH (ref 70–99)
Potassium: 3.6 mmol/L (ref 3.5–5.1)
Sodium: 137 mmol/L (ref 135–145)

## 2019-10-03 LAB — TRIGLYCERIDES: Triglycerides: 54 mg/dL (ref <150)

## 2019-10-03 LAB — PLATELET INHIBITION P2Y12: Platelet Function  P2Y12: 43 [PRU] — ABNORMAL LOW (ref 182–335)

## 2019-10-03 NOTE — Discharge Summary (Signed)
Patient ID: DREAM NODAL MRN: [id number] DOB/AGE: 08-01-1955 64 y.o.  Admit date: 10/02/2019 Discharge date: 10/03/2019  Supervising Physician: Baldemar Lenis  Patient Status: [facility] - In-pt  Admission Diagnoses: Brain aneurysm  Discharge Diagnoses:  Active Problems:   Brain aneurysm   Discharged Condition: good  Hospital Course: Victor Pena is a 64 year old male with history of R ICA and L MCA bifurcation aneurysms.  [name] known to NIR from prior emergent treatment of his L MCA bifurcation aneurysm 08/06/19 with balloon-assisted coiling after an acute rupture and again on 08/19/19 with additional Atlas stent placement after symptoms of slurred speech on 08/16/19.  He was able to be discharged after the procedure in stable condition.  He was noted to have residual right forearm numbness and mild speech hesitancy. He returned to [facility] Radiology 10/02/19 for treatment of his R ICA aneurysm. His right forearm numbness remains. His dysarthria was significantly improved.  [name] s/p Pipeline shield device placement across the 5 mm right ICA ophthalmic segment wide neck aneurysm. He was admitted overnight for observation. [name] assessed this AM and found to be in stable condition.  No acute events noted overnight. VSS. He has been able to eat and drink with tolerance.  He has ambulated in his room without difficulty.  [name] voiding. His right radial access site is intact without bruising or bleeding.  His P2Y12 this AM is 43.  [name] stable for discharge home today  On Brilinta 90mg  once daily, aspirin 81 mg once daily.   [name] given care instructions.   A P2Y12 lab draw has been ordered for early next week.  He should expect a phone call from our office within the next week.  He should plan for a CTA Head and Neck in 6 months.   He verbalizes understanding of all instructions and discussions as documented above.  [name] encouraged to keep all his scheduled appointments with NIR,  Neurology, Opthalmology, and therapy services.    Discharge Exam: Blood pressure 139/86, pulse 74, temperature 97.6 F (36.4 C), temperature source Oral, resp. rate (!) 21, height 5\' 11"  (1.803 m), weight 188 lb 4.4 oz (85.4 kg), SpO2 94 %. General appearance: alert, cooperative and no distress Resp: clear to auscultation bilaterally Cardio: regular rate and rhythm, S1, S2 normal, no murmur, click, rub or gallop GI: soft, non-tender; bowel sounds normal; no masses,  no organomegaly Skin: Skin color, texture, turgor normal. No rashes or lesions Incision/Wound:Right radial access site intact. No bruising or bleeding.  No tenderness to palpation.   Disposition: Discharge disposition: 01-Home or Self Care       Discharge Instructions    Diet - low sodium heart healthy   Complete by: As directed    Discharge instructions   Complete by: As directed    Increase activity slowly.  Do not lift more than 10 lbs with the right arm for 2 weeks.  Drink plenty of water.   An order has been placed for a P2Y12 assessment early next week (Monday or Tuesday due to holiday). This can be drawn at the [facility].  Expect a phone call from our schedulers in 1 week to check-in.  Please call if issues/concerns arise.   Keep all other scheduled appointments at this time.   Increase activity slowly   Complete by: As directed    Remove dressing in 48 hours   Complete by: As directed    Bandage/bandaid as  needed to keep puncture site clean and dry.  Do not submerge in water for 3 days, but may shower.     Allergies as of 10/03/2019      Reactions   Other    seasonal      Medication List    TAKE these medications   acetaminophen 500 MG tablet Commonly known as: TYLENOL Take 1,000 mg by mouth every 6 (six) hours as needed for mild pain or headache.   aspirin 81 MG EC tablet Take 81 mg by mouth daily. Swallow whole.   Brilinta 90 MG Tabs tablet Generic drug: ticagrelor Take 90 mg by mouth  daily.   calcipotriene-betamethasone external suspension Commonly known as: Taclonex Apply topically daily. What changed:   how much to take  when to take this  reasons to take this   nicotine 14 mg/24hr patch Commonly known as: NICODERM CQ - dosed in mg/24 hours Place 1 patch (14 mg total) onto the skin daily. What changed:   when to take this  reasons to take this       Follow-up Information    de Glori Luis, MD Follow up.   Specialties: Radiology, Interventional Radiology Why: Come to [facility] Lab on Monday or Tuesday of next week for P2Y12.  Expect a phone call in approximately 1 week to check-in.  Contact information: [address] [place] [address] [phone]                Electronically Signed: Hoyt Koch, PA 10/03/2019, 11:42 AM   I have spent Greater Than 30 Minutes discharging [name].

## 2019-10-16 ENCOUNTER — Encounter: Payer: Self-pay | Admitting: Neurology

## 2019-10-16 ENCOUNTER — Other Ambulatory Visit: Payer: Self-pay

## 2019-10-16 ENCOUNTER — Ambulatory Visit (INDEPENDENT_AMBULATORY_CARE_PROVIDER_SITE_OTHER): Payer: Managed Care, Other (non HMO) | Admitting: Neurology

## 2019-10-16 VITALS — BP 151/82 | HR 74 | Ht 71.0 in | Wt 185.0 lb

## 2019-10-16 DIAGNOSIS — I671 Cerebral aneurysm, nonruptured: Secondary | ICD-10-CM

## 2019-10-16 DIAGNOSIS — I609 Nontraumatic subarachnoid hemorrhage, unspecified: Secondary | ICD-10-CM | POA: Diagnosis not present

## 2019-10-16 DIAGNOSIS — I6012 Nontraumatic subarachnoid hemorrhage from left middle cerebral artery: Secondary | ICD-10-CM

## 2019-10-16 MED ORDER — ATORVASTATIN CALCIUM 40 MG PO TABS: 40.0000 mg | ORAL_TABLET | Freq: Every day | ORAL | 1 refills | Status: DC

## 2019-10-16 NOTE — Progress Notes (Signed)
[facility] [address] [place]. [place] [address] [phone]       OFFICE FOLLOW-UP NOTE  Mr. ALAMIN MCCUISTON Date of Birth:  1956/03/26 Medical Record Number:  [id number]   HPI: Mr. Perin is a pleasant 64 year old Caucasian male seen today for initial office follow-up visit following hospital consultation for stroke in May 2021.  History is obtained from the patient, review of electronic medical records and I personally reviewed available imaging films in PACS.  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  He was scheduled to undergo elective treatment of the right-sided aneurysm but presented to [facility] on 08/06/2019 as a code stroke with sudden onset of transient expressive language difficulties lasting 5 minutes with recurrent episode lasting 2 minutes.  He also noticed problems using his right hand with dropping cups and lack of dexterity.  [name] also on inquiry admitted to mild left occipital region headache.  CT scan of the head showed left frontal convexity localized subarachnoid hemorrhage likely from leaking left MCA bifurcation aneurysm.  He underwent balloon assisted coiling of leaking left MCA aneurysm without incident.  Brilinta was discontinued.  He will doing well until he had a transient episode of worsening aphasia on 08/16/2019 and CT scan showed new punctate infarct in the left posterior temporal region likely related to vasospasm versus platelet aggregation at the coil mass interface given the wide mechanism.  He was restarted on Brilinta at that time.  He had follow-up diagnostic cerebral angiogram on 08/19/2019 that showed unimpeded flow in the left MCA  branches and there was a suggestion of minimum platelet aggregation at the coil mass artery interface and patient then underwent elective left MCA stent across the neck of the aneurysm.  He was discharged home and did well.  He came back subsequently on 10/02/2019 for right ICA aneurysm treatment with pipeline stent which was uneventful.  Patient states that his speech has recovered completely.  Right hand dexterity is also good.  He remains on aspirin and Brilinta and is tolerating well with only minor bruising and no bleeding.  He had serial TCD's in the hospital to watch for vasospasms which were uneventful.  EEG x2 was negative for seizures.  LDL cholesterol was elevated 149 mg percent.  IMA globin A1c was 5.2.  Patient has family history of aneurysms and his dad died of subarachnoid hemorrhage.  Patient states his blood pressure is better at home though today it is elevated in office at 151/82.  He knows he needs to quit smoking and he has cut back but is still smoking.  ROS:   14 system review of systems is positive for minor bruising and no other complaints all systems negative  PMH:  Past Medical History:  Diagnosis Date  . Cerebral aneurysm   . Diplopia   . Intracranial aneurysm   . Seasonal allergies   . Wears glasses     Social History:  Social History   Socioeconomic History  . Marital status: Married    Spouse name: Not on file  . Number of children: 4  . Years of education: 26  . Highest education level: Not on file  Occupational History  . Occupation: Network engineer  Tobacco Use  . Smoking status: Current Every Day Smoker    Packs/day: 0.25    Types: Cigarettes  . Smokeless tobacco: Never Used  Vaping Use  . Vaping Use: Never used  Substance and Sexual Activity  . Alcohol use: Yes    Alcohol/week: 10.0 standard drinks    Types: 10 Glasses of wine per week    Comment: one to two glasses or wine per night  . Drug use: Not Currently  . Sexual activity: Not on file  Other  Topics Concern  . Not on file  Social History Narrative   Right handed   3 story home   Drinks caffeine   Social Determinants of Health   Financial Resource Strain:   . Difficulty of Paying Living Expenses:   Food Insecurity:   . Worried About Programme researcher, broadcasting/film/video in the Last Year:   . Barista in the Last Year:   Transportation Needs:   . Freight forwarder (Medical):   Marland Kitchen Lack of Transportation (Non-Medical):   Physical Activity:   . Days of Exercise per Week:   . Minutes of Exercise per Session:   Stress:   . Feeling of Stress :   Social Connections:   . Frequency of Communication with Friends and Family:   . Frequency of Social Gatherings with Friends and Family:   . Attends Religious Services:   . Active Member of Clubs or Organizations:   . Attends Banker Meetings:   Marland Kitchen Marital Status:   Intimate Partner Violence:   . Fear of Current or Ex-Partner:   . Emotionally Abused:   Marland Kitchen Physically Abused:   . Sexually Abused:     Medications:   Current Outpatient Medications on File Prior to Visit  Medication Sig Dispense Refill  . acetaminophen (TYLENOL) 500 MG tablet Take 1,000 mg by mouth every 6 (six) hours as needed for mild pain or headache.    Marland Kitchen aspirin 81 MG EC tablet Take 81 mg by mouth daily. Swallow whole.    . calcipotriene-betamethasone (TACLONEX) external suspension Apply topically daily. (Patient taking differently: Apply 1 application topically daily as needed (on hands). ) 120 g 2  . nicotine (NICODERM CQ - DOSED IN MG/24 HOURS) 14 mg/24hr patch Place 1 patch (14 mg total) onto the skin daily. (Patient taking differently: Place 14 mg onto the skin daily as needed (nicotine). ) 28 patch 0  . ticagrelor (BRILINTA) 90 MG TABS tablet Take 90 mg by mouth daily.      No current facility-administered medications on file prior to visit.    Allergies:   Allergies  Allergen Reactions  . Other     seasonal    Physical Exam General: well  developed, well nourished middle-aged Caucasian male, seated, in no evident distress Head: head normocephalic and atraumatic.  Neck: supple with no carotid or supraclavicular bruits Cardiovascular: regular rate and rhythm, no murmurs Musculoskeletal: no deformity Skin:  no rash/petichiae Vascular:  Normal pulses all extremities Vitals:   10/16/19 1434  BP: (!) 151/82  Pulse: 74   Neurologic Exam Mental Status: Awake and fully alert. Oriented to place and time. Recent and remote memory intact. Attention span, concentration and fund of knowledge appropriate. Mood and affect appropriate.  Cranial Nerves: Fundoscopic exam reveals sharp disc margins. Pupils equal, briskly reactive to light. Extraocular movements full without nystagmus. Visual fields full to confrontation. Hearing intact. Facial sensation intact. Face, tongue, palate moves normally and symmetrically.  Motor: Normal  bulk and tone. Normal strength in all tested extremity muscles. Sensory.: intact to touch ,pinprick .position and vibratory sensation.  Coordination: Rapid alternating movements normal in all extremities. Finger-to-nose and heel-to-shin performed accurately bilaterally. Gait and Station: Arises from chair without difficulty. Stance is normal. Gait demonstrates normal stride length and balance . Able to heel, toe and tandem walk without difficulty.  Reflexes: 1+ and symmetric. Toes downgoing.   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  Initial left MCA and is on treatment complicated by small left MCA branch infarcts but he is made good neurological recovery with practically no deficits.  Vascular risk factors of smoking, hyperlipidemia and borderline  hypertension     PLAN: I had a long discussion with the patient regarding his recent hospitalizations for sentinel leak subarachnoid hemorrhage from left MCA bifurcation aneurysm which has been treated with coiling and stenting and he had small left MCA branch infarct postprocedure from which he is recovering very well without any focal residual deficits.  I recommend he stay on aspirin and Brilinta for 6 months following his and rhythm coiling followed by aspirin alone for life and maintain aggressive risk factor modification with strict control of hypertension with blood pressure goal below 130/90, lipids with LDL cholesterol goal below 70 mg percent and have counseled him to quit  completely.  He will continue follow-up with Dr. Sherlon Handing for 17-month angiograms for surveillance of his coiled right carotid and left middle cerebral aryery bifurcation aneurysms and return for follow-up with me in 6 months as well. Greater than 50% of time during this 30 minute visit was spent on counseling,explanation of diagnosis subarachnoid hemorrhage, cerebral aneurysms and strokes, planning of further management, discussion with patient and family and coordination of care Delia Heady, MD  [facility] [address] [place], [place] [address]  Phone [phone] Fax [phone] Note: This document was prepared with digital dictation and possible smart phrase technology. Any transcriptional errors that result from this process are unintentional

## 2019-10-16 NOTE — Patient Instructions (Signed)
I had a long discussion with the patient regarding his recent hospitalizations for sentinel leak subarachnoid hemorrhage from left MCA bifurcation aneurysm which has been treated with coiling and stenting and he had small left MCA branch infarct postprocedure from which he is recovering very well without any focal residual deficits.  I recommend he stay on aspirin and Brilinta for 6 months following his and rhythm coiling followed by aspirin alone for life and maintain aggressive risk factor modification with strict control of hypertension with blood pressure goal below 130/90, lipids with LDL cholesterol goal below 70 mg percent and have counseled him to quit smoking completely.  He will continue follow-up with Dr. Sherlon Handing for 69-month angiograms for surveillance of his coiled right carotid and left middle cerebral aryery bifurcation aneurysms and return for follow-up with me in 6 months as well.  Stroke Prevention Some medical conditions and behaviors are associated with a higher chance of having a stroke. You can help prevent a stroke by making nutrition, lifestyle, and other changes, including managing any medical conditions you may have. What nutrition changes can be made?   Eat healthy foods. You can do this by: ? Choosing foods high in fiber, such as fresh fruits and vegetables and whole grains. ? Eating at least 5 or more servings of fruits and vegetables a day. Try to fill half of your plate at each meal with fruits and vegetables. ? Choosing lean protein foods, such as lean cuts of meat, poultry without skin, fish, tofu, beans, and nuts. ? Eating low-fat dairy products. ? Avoiding foods that are high in salt (sodium). This can help lower blood pressure. ? Avoiding foods that have saturated fat, trans fat, and cholesterol. This can help prevent high cholesterol. ? Avoiding processed and premade foods.  Follow your health care provider's specific guidelines for losing weight, controlling  high blood pressure (hypertension), lowering high cholesterol, and managing diabetes. These may include: ? Reducing your daily calorie intake. ? Limiting your daily sodium intake to 1,500 milligrams (mg). ? Using only healthy fats for cooking, such as olive oil, canola oil, or sunflower oil. ? Counting your daily carbohydrate intake. What lifestyle changes can be made?  Maintain a healthy weight. Talk to your health care provider about your ideal weight.  Get at least 30 minutes of moderate physical activity at least 5 days a week. Moderate activity includes brisk walking, biking, and swimming.  Do not use any products that contain nicotine or tobacco, such as cigarettes and e-cigarettes. If you need help quitting, ask your health care provider. It may also be helpful to avoid exposure to secondhand smoke.  Limit alcohol intake to no more than 1 drink a day for nonpregnant women and 2 drinks a day for men. One drink equals 12 oz of beer, 5 oz of wine, or 1 oz of hard liquor.  Stop any illegal drug use.  Avoid taking birth control pills. Talk to your health care provider about the risks of taking birth control pills if: ? You are over 29 years old. ? You smoke. ? You get migraines. ? You have ever had a blood clot. What other changes can be made?  Manage your cholesterol levels. ? Eating a healthy diet is important for preventing high cholesterol. If cholesterol cannot be managed through diet alone, you may also need to take medicines. ? Take any prescribed medicines to control your cholesterol as told by your health care provider.  Manage your diabetes. ? Eating a healthy diet  and exercising regularly are important parts of managing your blood sugar. If your blood sugar cannot be managed through diet and exercise, you may need to take medicines. ? Take any prescribed medicines to control your diabetes as told by your health care provider.  Control your hypertension. ? To reduce your  risk of stroke, try to keep your blood pressure below 130/80. ? Eating a healthy diet and exercising regularly are an important part of controlling your blood pressure. If your blood pressure cannot be managed through diet and exercise, you may need to take medicines. ? Take any prescribed medicines to control hypertension as told by your health care provider. ? Ask your health care provider if you should monitor your blood pressure at home. ? Have your blood pressure checked every year, even if your blood pressure is normal. Blood pressure increases with age and some medical conditions.  Get evaluated for sleep disorders (sleep apnea). Talk to your health care provider about getting a sleep evaluation if you snore a lot or have excessive sleepiness.  Take over-the-counter and prescription medicines only as told by your health care provider. Aspirin or blood thinners (antiplatelets or anticoagulants) may be recommended to reduce your risk of forming blood clots that can lead to stroke.  Make sure that any other medical conditions you have, such as atrial fibrillation or atherosclerosis, are managed. What are the warning signs of a stroke? The warning signs of a stroke can be easily remembered as BEFAST.  B is for balance. Signs include: ? Dizziness. ? Loss of balance or coordination. ? Sudden trouble walking.  E is for eyes. Signs include: ? A sudden change in vision. ? Trouble seeing.  F is for face. Signs include: ? Sudden weakness or numbness of the face. ? The face or eyelid drooping to one side.  A is for arms. Signs include: ? Sudden weakness or numbness of the arm, usually on one side of the body.  S is for speech. Signs include: ? Trouble speaking (aphasia). ? Trouble understanding.  T is for time. ? These symptoms may represent a serious problem that is an emergency. Do not wait to see if the symptoms will go away. Get medical help right away. Call your local emergency  services (911 in the [place] Do not drive yourself to the hospital.  Other signs of stroke may include: ? A sudden, severe headache with no known cause. ? Nausea or vomiting. ? Seizure. Where to find more information For more information, visit:  American Stroke Association: www.strokeassociation.org  National Stroke Association: www.stroke.org Summary  You can prevent a stroke by eating healthy, exercising, not smoking, limiting alcohol intake, and managing any medical conditions you may have.  Do not use any products that contain nicotine or tobacco, such as cigarettes and e-cigarettes. If you need help quitting, ask your health care provider. It may also be helpful to avoid exposure to secondhand smoke.  Remember BEFAST for warning signs of stroke. Get help right away if you or a loved one has any of these signs. This information is not intended to replace advice given to you by your health care provider. Make sure you discuss any questions you have with your health care provider. Document Revised: 03/03/2017 Document Reviewed: 04/26/2016 Elsevier Patient Education  2020 [employer].

## 2019-11-04 ENCOUNTER — Telehealth: Payer: Self-pay | Admitting: Physician Assistant

## 2019-11-04 ENCOUNTER — Other Ambulatory Visit: Payer: Self-pay | Admitting: Physician Assistant

## 2019-11-04 ENCOUNTER — Other Ambulatory Visit (HOSPITAL_COMMUNITY): Payer: Self-pay | Admitting: Neuroradiology

## 2019-11-04 LAB — PLATELET INHIBITION P2Y12: Platelet Function  P2Y12: 10 [PRU] — ABNORMAL LOW (ref 182–335)

## 2019-11-04 NOTE — Telephone Encounter (Signed)
Patient on Brilinta 90 mg BID, noted to have p2y12 today of 10 - discussed results with Dr. Tommie Sams who recommends that if patient does not have any abnormal bleeding to remain on current dose x 2 months, then decrease to 60 mg QD until repeat angiogram at 6 months.  Spoke with Mr. Voeltz via phone today, he denies any abnormal bleeding or other concerns. He asks that his diagnostic angiogram be scheduled prior to the end of the year for insurance purposes. He understands above plan and denies any questions. He was encouraged to call [facility] with any questions or concerns prior to his procedure.  Lynnette Caffey, PA-C

## 2019-11-06 ENCOUNTER — Encounter: Payer: Self-pay | Admitting: Physician Assistant

## 2019-11-06 ENCOUNTER — Other Ambulatory Visit: Payer: Self-pay

## 2019-11-06 ENCOUNTER — Ambulatory Visit (INDEPENDENT_AMBULATORY_CARE_PROVIDER_SITE_OTHER): Payer: Managed Care, Other (non HMO) | Admitting: Physician Assistant

## 2019-11-06 DIAGNOSIS — L409 Psoriasis, unspecified: Secondary | ICD-10-CM | POA: Diagnosis not present

## 2019-11-06 MED ORDER — OTEZLA 10 & 20 & 30 MG PO TBPK: ORAL_TABLET | ORAL | 0 refills | Status: DC

## 2019-11-06 NOTE — Progress Notes (Signed)
Otezla starter pack sample given in office. Lot #[id number] EXP-04-2021

## 2019-11-06 NOTE — Progress Notes (Signed)
   Follow up Visit  Subjective  Victor Pena is a 64 y.o. male who presents for the following: Psoriasis (Follow up on hands, feet and a few other spots. tx calcipotriene and batamethasone soution). No real improvement on hands or feet. [name] also getting more rash on his arms and legs. Becoming a quality of life issue as his hands are painful to use and his feet are painful to walk on. [name] no longer able to play golf.   Objective  Well appearing patient in no apparent distress; mood and affect are within normal limits.  A focused examination was performed including upper extremities, including the arms, hands, fingers, and fingernails and lower extremities, including the legs, feet, toes, and toenails. Relevant physical exam findings are noted in the Assessment and Plan.   Objective  Left Forearm - Anterior, Left Hand - Anterior, Left Lower Leg - Anterior, Left Middle Plantar Surface, Left Thigh - Anterior, Right Forearm - Anterior, Right Hand - Anterior, Right Lower Leg - Anterior, Right Middle Plantar Surface, Right Thigh - Anterior: Well-marginated erythematous papules/plaques with silvery scale. Palms are erythematous with scale and some fissures.  Assessment & Plan  Psoriasis (10) Left Hand - Anterior; Right Hand - Anterior; Left Forearm - Anterior; Right Forearm - Anterior; Left Thigh - Anterior; Right Thigh - Anterior; Left Lower Leg - Anterior; Right Lower Leg - Anterior; Left Middle Plantar Surface; Right Middle Plantar Surface  Otezla new start.   Ordered Medications: Apremilast (OTEZLA) 10 & 20 & 30 MG TBPK

## 2019-11-12 ENCOUNTER — Telehealth: Payer: Self-pay

## 2019-11-12 NOTE — Telephone Encounter (Signed)
     Sent senderra via fax  Kristine Garbe support also

## 2019-11-27 ENCOUNTER — Telehealth: Payer: Self-pay | Admitting: *Deleted

## 2019-11-27 NOTE — Telephone Encounter (Signed)
PA approved [place]

## 2020-02-03 ENCOUNTER — Other Ambulatory Visit (HOSPITAL_COMMUNITY): Payer: Self-pay | Admitting: Interventional Radiology

## 2020-02-03 DIAGNOSIS — I609 Nontraumatic subarachnoid hemorrhage, unspecified: Secondary | ICD-10-CM

## 2020-02-03 DIAGNOSIS — I671 Cerebral aneurysm, nonruptured: Secondary | ICD-10-CM

## 2020-02-10 ENCOUNTER — Telehealth: Payer: Self-pay | Admitting: Neurology

## 2020-02-11 NOTE — Telephone Encounter (Signed)
Called and spoke to patients wife and she wanted to know what genetic testing was recommended for her children. Informed patients wife that there is no genetic testing for a ruptured aneurysm. Informed patients wife that she may contact her Marjo Bicker PCP and let them know about the family history of anneryms and speak to them about preventative care/imaging. Patients wife thanked [place] for the information and will do that.

## 2020-03-16 ENCOUNTER — Other Ambulatory Visit: Payer: Self-pay | Admitting: Radiology

## 2020-03-17 ENCOUNTER — Ambulatory Visit (HOSPITAL_COMMUNITY)
Admission: RE | Admit: 2020-03-17 | Discharge: 2020-03-17 | Disposition: A | Discharge status: Home / Self Care | Payer: Managed Care, Other (non HMO) | Source: Ambulatory Visit | Attending: Interventional Radiology | Admitting: Interventional Radiology

## 2020-03-17 ENCOUNTER — Other Ambulatory Visit: Payer: Self-pay

## 2020-03-17 ENCOUNTER — Encounter (HOSPITAL_COMMUNITY): Payer: Self-pay

## 2020-03-17 ENCOUNTER — Other Ambulatory Visit (HOSPITAL_COMMUNITY): Payer: Self-pay | Admitting: Interventional Radiology

## 2020-03-17 DIAGNOSIS — I609 Nontraumatic subarachnoid hemorrhage, unspecified: Secondary | ICD-10-CM

## 2020-03-17 DIAGNOSIS — Z7982 Long term (current) use of aspirin: Secondary | ICD-10-CM | POA: Diagnosis not present

## 2020-03-17 DIAGNOSIS — F17210 Nicotine dependence, cigarettes, uncomplicated: Secondary | ICD-10-CM | POA: Insufficient documentation

## 2020-03-17 DIAGNOSIS — I671 Cerebral aneurysm, nonruptured: Secondary | ICD-10-CM | POA: Diagnosis not present

## 2020-03-17 DIAGNOSIS — Z79899 Other long term (current) drug therapy: Secondary | ICD-10-CM | POA: Insufficient documentation

## 2020-03-17 LAB — CBC
HCT: 45.4 % (ref 39.0–52.0)
Hemoglobin: 15 g/dL (ref 13.0–17.0)
MCH: 33 pg (ref 26.0–34.0)
MCHC: 33 g/dL (ref 30.0–36.0)
MCV: 99.8 fL (ref 80.0–100.0)
Platelets: 321 10*3/uL (ref 150–400)
RBC: 4.55 MIL/uL (ref 4.22–5.81)
RDW: 14.8 % (ref 11.5–15.5)
WBC: 6.7 10*3/uL (ref 4.0–10.5)
nRBC: 0 % (ref 0.0–0.2)

## 2020-03-17 LAB — BASIC METABOLIC PANEL
Anion gap: 16 — ABNORMAL HIGH (ref 5–15)
BUN: 13 mg/dL (ref 8–23)
CO2: 23 mmol/L (ref 22–32)
Calcium: 10 mg/dL (ref 8.9–10.3)
Chloride: 102 mmol/L (ref 98–111)
Creatinine, Ser: 0.91 mg/dL (ref 0.61–1.24)
GFR, Estimated: >60 mL/min (ref >60)
Glucose, Bld: 101 mg/dL — ABNORMAL HIGH (ref 70–99)
Potassium: 4.2 mmol/L (ref 3.5–5.1)
Sodium: 141 mmol/L (ref 135–145)

## 2020-03-17 LAB — PROTIME-INR
INR: 0.9 (ref 0.8–1.2)
Prothrombin Time: 11.8 seconds (ref 11.4–15.2)

## 2020-03-17 LAB — APTT: aPTT: 27 seconds (ref 24–36)

## 2020-03-17 MED ORDER — LIDOCAINE HCL 1 % IJ SOLN
INTRAMUSCULAR | Status: AC
  Filled 2020-03-17: qty 20

## 2020-03-17 MED ORDER — FENTANYL CITRATE (PF) 100 MCG/2ML IJ SOLN
INTRAMUSCULAR | Status: AC | PRN
  Administered 2020-03-17: 25 ug via INTRAVENOUS

## 2020-03-17 MED ORDER — SODIUM CHLORIDE 0.9 % IV SOLN: INTRAVENOUS | Status: DC

## 2020-03-17 MED ORDER — VERAPAMIL HCL 2.5 MG/ML IV SOLN
INTRAVENOUS | Status: AC | PRN
Start: 2020-03-17 — End: 2020-03-17
  Administered 2020-03-17: 2.5 mg via INTRAVENOUS

## 2020-03-17 MED ORDER — MIDAZOLAM HCL 2 MG/2ML IJ SOLN
INTRAMUSCULAR | Status: AC | PRN
  Administered 2020-03-17: 1 mg via INTRAVENOUS

## 2020-03-17 MED ORDER — LIDOCAINE HCL (PF) 1 % IJ SOLN
INTRAMUSCULAR | Status: AC | PRN
  Administered 2020-03-17: 5 mL

## 2020-03-17 MED ORDER — IOHEXOL 300 MG/ML  SOLN
150.0000 mL | Freq: Once | INTRAMUSCULAR | Status: AC | PRN
  Administered 2020-03-17: 75 mL via INTRA_ARTERIAL

## 2020-03-17 MED ORDER — NITROGLYCERIN 1 MG/10 ML FOR IR/CATH LAB
INTRA_ARTERIAL | Status: AC | PRN
  Administered 2020-03-17 (×2): 200 ug via INTRA_ARTERIAL

## 2020-03-17 MED ORDER — IOHEXOL 300 MG/ML  SOLN
50.0000 mL | Freq: Once | INTRAMUSCULAR | Status: AC | PRN
  Administered 2020-03-17: 10 mL via INTRA_ARTERIAL

## 2020-03-17 MED ORDER — HEPARIN SODIUM (PORCINE) 1000 UNIT/ML IJ SOLN
INTRAMUSCULAR | Status: AC | PRN
  Administered 2020-03-17: 2000 [IU] via INTRA_ARTERIAL

## 2020-03-17 MED ORDER — MIDAZOLAM HCL 2 MG/2ML IJ SOLN
INTRAMUSCULAR | Status: AC
  Filled 2020-03-17: qty 2

## 2020-03-17 MED ORDER — VERAPAMIL HCL 2.5 MG/ML IV SOLN
INTRAVENOUS | Status: AC
  Filled 2020-03-17: qty 2

## 2020-03-17 MED ORDER — FENTANYL CITRATE (PF) 100 MCG/2ML IJ SOLN
INTRAMUSCULAR | Status: AC
  Filled 2020-03-17: qty 2

## 2020-03-17 MED ORDER — NITROGLYCERIN 1 MG/10 ML FOR IR/CATH LAB
INTRA_ARTERIAL | Status: AC
  Filled 2020-03-17: qty 10

## 2020-03-17 MED ORDER — HEPARIN SODIUM (PORCINE) 1000 UNIT/ML IJ SOLN
INTRAMUSCULAR | Status: AC
  Filled 2020-03-17: qty 1

## 2020-03-17 NOTE — Discharge Instructions (Signed)
Drink plenty of fluid for 48 hours and keep wrist elevated at heart level for 24 hours  Radial Site Care   This sheet gives you information about how to care for yourself after your procedure. Your health care provider may also give you more specific instructions. If you have problems or questions, contact your health care provider. What can I expect after the procedure? After the procedure, it is common to have:  Bruising and tenderness at the catheter insertion area. Follow these instructions at home: Medicines  Take over-the-counter and prescription medicines only as told by your health care provider. Insertion site care 1. Follow instructions from your health care provider about how to take care of your insertion site. Make sure you: ? Wash your hands with soap and water before you change your bandage (dressing). If soap and water are not available, use hand sanitizer. ? remove your dressing as told by your health care provider. In 24 hours 2. Check your insertion site every day for signs of infection. Check for: ? Redness, swelling, or pain. ? Fluid or blood. ? Pus or a bad smell. ? Warmth. 3. Do not take baths, swim, or use a hot tub until your health care provider approves. 4. You may shower 24-48 hours after the procedure, or as directed by your health care provider. ? Remove the dressing and gently wash the site with plain soap and water. ? Pat the area dry with a clean towel. ? Do not rub the site. That could cause bleeding. 5. Do not apply powder or lotion to the site. Activity   1. For 24 hours after the procedure, or as directed by your health care provider: ? Do not flex or bend the affected arm. ? Do not push or pull heavy objects with the affected arm. ? Do not drive yourself home from the hospital or clinic. You may drive 24 hours after the procedure unless your health care provider tells you not to. ? Do not operate machinery or power tools. 2. Do not lift  anything that is heavier than 10 lb (4.5 kg), or the limit that you are told, until your health care provider says that it is safe. For 4 days 3. Ask your health care provider when it is okay to: ? Return to work or school. ? Resume usual physical activities or sports. ? Resume sexual activity. General instructions  If the catheter site starts to bleed, raise your arm and put firm pressure on the site. If the bleeding does not stop, get help right away. This is a medical emergency.  If you went home on the same day as your procedure, a responsible adult should be with you for the first 24 hours after you arrive home.  Keep all follow-up visits as told by your health care provider. This is important. Contact a health care provider if:  You have a fever.  You have redness, swelling, or yellow drainage around your insertion site. Get help right away if:  You have unusual pain at the radial site.  The catheter insertion area swells very fast.  The insertion area is bleeding, and the bleeding does not stop when you hold steady pressure on the area.  Your arm or hand becomes pale, cool, tingly, or numb. These symptoms may represent a serious problem that is an emergency. Do not wait to see if the symptoms will go away. Get medical help right away. Call your local emergency services (911 in the [place] Do not   drive yourself to the hospital. Summary  After the procedure, it is common to have bruising and tenderness at the site.  Follow instructions from your health care provider about how to take care of your radial site wound. Check the wound every day for signs of infection.  Do not lift anything that is heavier than 10 lb (4.5 kg), or the limit that you are told, until your health care provider says that it is safe. This information is not intended to replace advice given to you by your health care provider. Make sure you discuss any questions you have with your health care  provider. Document Revised: 04/26/2017 Document Reviewed: 04/26/2017 Elsevier Patient Education  2020 Elsevier Inc.  

## 2020-03-17 NOTE — Progress Notes (Signed)
Patient was given discharge instructions. He verbalized understanding. 

## 2020-03-17 NOTE — H&P (Signed)
Chief Complaint: Patient was seen in consultation today for Cerebral arteriogram at the request of Dr Agnes Lawrence   Supervising Physician: Julieanne Cotton  Patient Status: [facility] - Out[facility]  History of Present Illness: Victor Pena is a 64 y.o. male   Known to NIR L MCA aneurysm coil 08/06/19 L MCA aneurysm pipeline stent 08/19/19 R ICA aneurysm embolization 10/02/19 Interventions performed by Dr Judie Bonus  Here today for follow up cerebral arteriogram [name] doing well Denies headaches;N/V Denies visual or speech changes Using all 4s equally Taking ASA 81 and Brilinta daily  Scheduled today for follow up evaluation  Past Medical History:  Diagnosis Date  . Cerebral aneurysm   . Diplopia   . Intracranial aneurysm   . Seasonal allergies   . Wears glasses     Past Surgical History:  Procedure Laterality Date  . BACK SURGERY    . COLONOSCOPY    . IR 3D INDEPENDENT WKST  07/19/2019  . IR 3D INDEPENDENT WKST  07/19/2019  . IR 3D INDEPENDENT WKST  08/06/2019  . IR 3D INDEPENDENT WKST  08/19/2019  . IR 3D INDEPENDENT WKST  10/02/2019  . IR ANGIO EXTERNAL CAROTID SEL EXT CAROTID BILAT MOD SED  07/19/2019  . IR ANGIO INTRA EXTRACRAN SEL INTERNAL CAROTID BILAT MOD SED  07/19/2019  . IR ANGIO INTRA EXTRACRAN SEL INTERNAL CAROTID BILAT MOD SED  10/02/2019  . IR ANGIO VERTEBRAL SEL VERTEBRAL BILAT MOD SED  07/19/2019  . IR ANGIO VERTEBRAL SEL VERTEBRAL UNI R MOD SED  10/02/2019  . IR ANGIOGRAM FOLLOW UP STUDY  08/06/2019  . IR ANGIOGRAM FOLLOW UP STUDY  08/06/2019  . IR ANGIOGRAM FOLLOW UP STUDY  08/06/2019  . IR ANGIOGRAM FOLLOW UP STUDY  08/06/2019  . IR ANGIOGRAM FOLLOW UP STUDY  08/06/2019  . IR ANGIOGRAM FOLLOW UP STUDY  08/06/2019  . IR ANGIOGRAM FOLLOW UP STUDY  10/02/2019  . IR CT HEAD LTD  08/06/2019  . IR CT HEAD LTD  08/19/2019  . IR CT HEAD LTD  10/02/2019  . IR INTRA CRAN STENT  08/19/2019  . IR NEURO EACH ADD'L AFTER BASIC UNI LEFT (MS)  08/06/2019  . IR TRANSCATH/EMBOLIZ   08/06/2019  . IR TRANSCATH/EMBOLIZ  10/02/2019      . IR TRANSCATH/EMBOLIZ  10/02/2019  . IR US GUIDE VASC ACCESS RIGHT  07/19/2019  . IR US GUIDE VASC ACCESS RIGHT  08/06/2019  . IR US GUIDE VASC ACCESS RIGHT  08/19/2019  . IR US GUIDE VASC ACCESS RIGHT  10/02/2019  . RADIOLOGY WITH ANESTHESIA N/A 08/06/2019   Procedure: IR WITH ANESTHESIA;  Surgeon: Radiologist, Medication, MD;  Location: [facility] OR;  Service: Radiology;  Laterality: N/A;  . RADIOLOGY WITH ANESTHESIA N/A 08/19/2019   Procedure: STENT PLACEMENT;  Surgeon: Baldemar Lenis, MD;  Location: [facility] OR;  Service: Radiology;  Laterality: N/A;  . RADIOLOGY WITH ANESTHESIA N/A 10/02/2019   Procedure: RADIOLOGY WITH ANESTHESIA  EMBOLIZATION;  Surgeon: Baldemar Lenis, MD;  Location: [facility] OR;  Service: Radiology;  Laterality: N/A;  . WISDOM TOOTH EXTRACTION      Allergies: Other  Medications: Prior to Admission medications   Medication Sig Start Date End Date Taking? Authorizing Provider  acetaminophen (TYLENOL) 500 MG tablet Take 1,000 mg by mouth every 6 (six) hours as needed for mild pain or headache.   Yes [provider]  aspirin 81 MG EC tablet Take 81 mg by mouth daily. Swallow whole.  Yes [provider]  atorvastatin (LIPITOR) 40 MG tablet Take 1 tablet (40 mg total) by mouth daily. 10/16/19  Yes Micki Riley, MD  ticagrelor (BRILINTA) 90 MG TABS tablet Take 90 mg by mouth daily.    Yes [provider]  Apremilast (OTEZLA) 10 & 20 & 30 MG TBPK Take as directed per package instructions. Patient not taking: Reported on 03/11/2020 11/06/19   Clark-Burning, Victorino Dike, PA-C  calcipotriene-betamethasone [facility]) external suspension Apply topically daily. Patient not taking: Reported on 03/11/2020 09/05/19   Clark-Burning, Victorino Dike, PA-C  nicotine (NICODERM CQ - DOSED IN MG/24 HOURS) 14 mg/24hr patch Place 1 patch (14 mg total) onto the skin daily. Patient not taking: Reported on 03/11/2020  08/21/19   Baird Lyons, PA-C     Family History  Problem Relation Age of Onset  . Aneurysm Father     Social History   Socioeconomic History  . Marital status: Married    Spouse name: Not on file  . Number of children: 4  . Years of education: 86  . Highest education level: Not on file  Occupational History  . Occupation: owner   Tobacco Use  . Smoking status: Current Every Day Smoker    Packs/day: 0.25    Types: Cigarettes  . Smokeless tobacco: Never Used  Vaping Use  . Vaping Use: Never used  Substance and Sexual Activity  . Alcohol use: Yes    Alcohol/week: 10.0 standard drinks    Types: 10 Glasses of wine per week    Comment: one to two glasses or wine per night  . Drug use: Not Currently  . Sexual activity: Not on file  Other Topics Concern  . Not on file  Social History Narrative   Right handed   3 story home   Drinks caffeine   Social Determinants of Health   Financial Resource Strain: Not on file  Food Insecurity: Not on file  Transportation Needs: Not on file  Physical Activity: Not on file  Stress: Not on file  Social Connections: Not on file    Review of Systems: A 12 point ROS discussed and pertinent positives are indicated in the HPI above.  All other systems are negative.  Review of Systems  Constitutional: Negative for activity change, fatigue and fever.  HENT: Negative for tinnitus, trouble swallowing and voice change.   Eyes: Negative for visual disturbance.  Respiratory: Negative for cough and shortness of breath.   Cardiovascular: Negative for chest pain.  Gastrointestinal: Negative for abdominal pain and nausea.  Musculoskeletal: Negative for back pain and gait problem.  Neurological: Negative for dizziness, tremors, seizures, syncope, facial asymmetry, speech difficulty, weakness, light-headedness, numbness and headaches.  Psychiatric/Behavioral: Negative for behavioral problems, confusion and decreased concentration.  All other  systems reviewed and are negative.   Vital Signs: BP (!) 144/88   Pulse 90   Temp 98.3 F (36.8 C) (Oral)   Resp 20   Ht 5\' 11"  (1.803 m)   Wt 180 lb (81.6 kg)   SpO2 99%   BMI 25.10 kg/m   Physical Exam Vitals reviewed.  HENT:     Mouth/Throat:     Mouth: Mucous membranes are moist.  Eyes:     Extraocular Movements: Extraocular movements intact.  Cardiovascular:     Rate and Rhythm: Normal rate and regular rhythm.     Heart sounds: Normal heart sounds.  Pulmonary:     Effort: Pulmonary effort is normal.     Breath sounds: Normal breath  sounds.  Abdominal:     Palpations: Abdomen is soft.     Tenderness: There is no abdominal tenderness.  Musculoskeletal:        General: Normal range of motion.     Cervical back: Normal range of motion.     Right lower leg: No edema.     Left lower leg: No edema.  Skin:    General: Skin is warm.  Neurological:     Mental Status: [name] alert and oriented to person, place, and time.  Psychiatric:        Behavior: Behavior normal.        Judgment: Judgment normal.     Imaging: No results found.  Labs:  CBC: Recent Labs    08/19/19 0320 10/02/19 0637 10/03/19 0500 03/17/20 0715  WBC 10.2 5.6 8.3 6.7  HGB 12.8* 13.3 12.0* 15.0  HCT 38.2* 41.5 36.1* 45.4  PLT 431* 294 256 321    COAGS: Recent Labs    07/15/19 1344 08/06/19 1217 08/06/19 1244 10/02/19 0637  INR 1.0 1.0 1.0 1.0  APTT 29 27 28   --     BMP: Recent Labs    08/11/19 0628 08/19/19 0320 10/02/19 0637 10/03/19 0500  NA 140 139 139 137  K 3.7 3.8 4.2 3.6  CL 108 109 107 107  CO2 21* 21* 23 22  GLUCOSE 99 104* 97 105*  BUN 15 17 11 13   CALCIUM 9.2 9.3 9.4 8.4*  CREATININE 0.73 0.93 0.90 0.85  GFRNONAA >60 >60 >60 >60  GFRAA >60 >60 >60 >60    LIVER FUNCTION TESTS: Recent Labs    08/06/19 1217  BILITOT 0.9  AST 49*  ALT 46*  ALKPHOS 60  PROT 7.4  ALBUMIN 4.0    TUMOR MARKERS: No results for input(s): AFPTM, CEA, CA199, CHROMGRNA  in the last 8760 hours.  Assessment and Plan:  Known to NIR Previous interventions L MCA aneurysm coil and pipeline stent 08/06/19 and 08/19/19 R ICA aneurysm embolization 10/02/19 Scheduled for follow up cerebral arteriogram Risks and benefits of cerebral angiogram with intervention were discussed with the patient including, but not limited to bleeding, infection, vascular injury, contrast induced renal failure, stroke or even death.  This interventional procedure involves the use of X-rays and because of the nature of the planned procedure, it is possible that we will have prolonged use of X-ray fluoroscopy.  Potential radiation risks to you include (but are not limited to) the following: - A slightly elevated risk for cancer  several years later in life. This risk is typically less than 0.5% percent. This risk is low in comparison to the normal incidence of human cancer, which is 33% for women and 50% for men according to the American Cancer Society. - Radiation induced injury can include skin redness, resembling a rash, tissue breakdown / ulcers and hair loss (which can be temporary or permanent).   The likelihood of either of these occurring depends on the difficulty of the procedure and whether you are sensitive to radiation due to previous procedures, disease, or genetic conditions.   IF your procedure requires a prolonged use of radiation, you will be notified and given written instructions for further action.  It is your responsibility to monitor the irradiated area for the 2 weeks following the procedure and to notify your physician if you are concerned that you have suffered a radiation induced injury.    All of the patient's questions were answered, patient is agreeable to proceed.  Consent signed and in chart.  Thank you for this interesting consult.  I greatly enjoyed meeting Herby Abrahamric A Freeberg and look forward to participating in their care.  A copy of this report was sent to the  requesting provider on this date.  Electronically Signed: Robet LeuPamela A Robbie Rideaux, PA-C 03/17/2020, 7:54 AM   I spent a total of    25 Minutes in face to face in clinical consultation, greater than 50% of which was counseling/coordinating care for cerebral arteriogram

## 2020-03-17 NOTE — Procedures (Signed)
S/P 4 vessel cerebral arteriogram RT rad approach. Findings. 1.Lt MCA bifurcation aneurysm  Obliterated with no evidence of recanalization or compaction.   2..Approx 50 % instent stenosis. 3. Approx 2.7 mm residual aneurysm along post wall  of Rt ICA at site of previous flow diverter. S..Aleaya Latona MD

## 2020-03-18 ENCOUNTER — Other Ambulatory Visit (HOSPITAL_COMMUNITY): Payer: Self-pay | Admitting: Interventional Radiology

## 2020-03-18 DIAGNOSIS — I609 Nontraumatic subarachnoid hemorrhage, unspecified: Secondary | ICD-10-CM

## 2020-03-18 DIAGNOSIS — I671 Cerebral aneurysm, nonruptured: Secondary | ICD-10-CM

## 2020-03-30 ENCOUNTER — Encounter: Payer: Self-pay | Admitting: Neurology

## 2020-03-30 ENCOUNTER — Ambulatory Visit (INDEPENDENT_AMBULATORY_CARE_PROVIDER_SITE_OTHER): Payer: Managed Care, Other (non HMO) | Admitting: Neurology

## 2020-03-30 ENCOUNTER — Other Ambulatory Visit: Payer: Self-pay

## 2020-03-30 VITALS — BP 140/80 | HR 72 | Ht 71.0 in | Wt 189.6 lb

## 2020-03-30 DIAGNOSIS — I671 Cerebral aneurysm, nonruptured: Secondary | ICD-10-CM

## 2020-03-30 NOTE — Patient Instructions (Signed)
I had a long discussion with the patient regarding his cerebral aneurysm s/p stent assisted coiling and discussed findings of recent repeat cerebral catheter angiogram which showed 50% IntraStent stenosis of one of the treated aneurysms and a small residual aneurysm on the other side.  I strongly encouraged him to quit smoking completely and encourage use of nicotine patch and seek additional help from his primary physician for the same.  Keep strict control of hypertension blood pressure goal below 130/90.  Continue aspirin and Brilinta and Lipitor for stroke prevention.  Follow-up with Dr. Larene Beach for follow-up angiograms for his aneurysms.  Return for follow-up to see me in 6 months or call earlier if necessary.

## 2020-03-30 NOTE — Progress Notes (Signed)
[facility] [address] [place]. [place] [address] [phone]       OFFICE FOLLOW-UP NOTE  [name] Date of Birth:  01/22/1956 Medical Record Number:  [id number]   HPI: Initial visit 10/16/2019 [name] is a pleasant 64 year old Caucasian male seen today for initial office follow-up visit following hospital consultation for stroke in May 2021.  History is obtained from the patient, review of electronic medical records and I personally reviewed available imaging films in PACS.  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  He was scheduled to undergo elective treatment of the right-sided aneurysm but presented to [facility] on 08/06/2019 as a code stroke with sudden onset of transient expressive language difficulties lasting 5 minutes with recurrent episode lasting 2 minutes.  He also noticed problems using his right hand with dropping cups and lack of dexterity.  [name] also on inquiry admitted to mild left occipital region headache.  CT scan of the head showed left frontal convexity localized subarachnoid hemorrhage likely from leaking left MCA bifurcation aneurysm.  He underwent balloon assisted coiling of leaking left MCA aneurysm without incident.  Brilinta was discontinued.  He will doing well until he had a transient episode of worsening aphasia on 08/16/2019 and CT scan showed new punctate infarct in the left posterior temporal region likely related to vasospasm versus platelet aggregation at the coil mass interface given the wide mechanism.  He was restarted on Brilinta at that time.  He had follow-up diagnostic cerebral angiogram on 08/19/2019 that showed unimpeded  flow in the left MCA branches and there was a suggestion of minimum platelet aggregation at the coil mass artery interface and patient then underwent elective left MCA stent across the neck of the aneurysm.  He was discharged home and did well.  He came back subsequently on 10/02/2019 for right ICA aneurysm treatment with pipeline stent which was uneventful.  Patient states that his speech has recovered completely.  Right hand dexterity is also good.  He remains on aspirin and Brilinta and is tolerating well with only minor bruising and no bleeding.  He had serial TCD's in the hospital to watch for vasospasms which were uneventful.  EEG x2 was negative for seizures.  LDL cholesterol was elevated 149 mg percent.  IMA globin A1c was 5.2.  Patient has family history of aneurysms and his dad died of subarachnoid hemorrhage.  Patient states his blood pressure is better at home though today it is elevated in office at 151/82.  He knows he needs to quit smoking and he has cut back but is still smoking. Update 03/30/2020: He returns for follow-up after last visit 5 months ago.  He states he has no new neurological issues.  He denies any headache, focal extremity weakness numbness gait or balance problems.  He underwent diagnostic cerebral catheter angiogram for aneurysm surveillance by Dr. Corliss Skains on 03/17/2020 which shows a residual 3.2 x 2.1 mm aneurysm arising at the site of the previously treated right ICA ophthalmic aneurysm.  There is also 50% stenosis in the stent assisted aneurysm coiling of the left MCA bifurcation and is in.  Patient is currently on aspirin and Brilinta and seems to be tolerating it well  but did get easy bruising.  He climbed this ladder a few weeks ago and got some minor injury to in his thigh which have led to nasty bruise.  He states her blood pressures well controlled today it is slightly borderline in office at 140/80.  He continues to smoke but knows he needs to quit.  He has no new  complaints today. ROS:   14 system review of systems is positive for minor bruising and no other complaints all systems negative  PMH:  Past Medical History:  Diagnosis Date  . Cerebral aneurysm   . Diplopia   . Intracranial aneurysm   . Seasonal allergies   . Wears glasses     Social History:  Social History   Socioeconomic History  . Marital status: Married    Spouse name: Not on file  . Number of children: 4  . Years of education: 77  . Highest education level: Not on file  Occupational History  . Occupation: owner   Tobacco Use  . Smoking status: Current Every Day Smoker    Packs/day: 0.25    Types: Cigarettes  . Smokeless tobacco: Never Used  Vaping Use  . Vaping Use: Never used  Substance and Sexual Activity  . Alcohol use: Yes    Alcohol/week: 10.0 standard drinks    Types: 10 Glasses of wine per week    Comment: one to two glasses or wine per night  . Drug use: Not Currently  . Sexual activity: Not on file  Other Topics Concern  . Not on file  Social History Narrative   Right handed   3 story home   Drinks caffeine   Social Determinants of Health   Financial Resource Strain: Not on file  Food Insecurity: Not on file  Transportation Needs: Not on file  Physical Activity: Not on file  Stress: Not on file  Social Connections: Not on file  Intimate Partner Violence: Not on file    Medications:   Current Outpatient Medications on File Prior to Visit  Medication Sig Dispense Refill  . acetaminophen (TYLENOL) 500 MG tablet Take 1,000 mg by mouth every 6 (six) hours as needed for mild pain or headache.    Marland Kitchen aspirin 81 MG EC tablet Take 81 mg by mouth daily. Swallow whole.    Marland Kitchen atorvastatin (LIPITOR) 40 MG tablet Take 1 tablet (40 mg total) by mouth daily. 90 tablet 1  . calcipotriene-betamethasone (TACLONEX) external suspension Apply topically daily. 120 g 2  . nicotine (NICODERM CQ - DOSED IN MG/24 HOURS) 14 mg/24hr patch Place 1 patch (14 mg total)  onto the skin daily. 28 patch 0  . ticagrelor (BRILINTA) 90 MG TABS tablet Take 90 mg by mouth daily.      No current facility-administered medications on file prior to visit.    Allergies:   Allergies  Allergen Reactions  . Other     seasonal    Physical Exam General: well developed, well nourished middle-aged Caucasian male, seated, in no evident distress Head: head normocephalic and atraumatic.  Neck: supple with no carotid or supraclavicular bruits Cardiovascular: regular rate and rhythm, no murmurs Musculoskeletal: no deformity Skin:  no rash/petichiae Vascular:  Normal pulses all extremities Vitals:   03/30/20 1346  BP: 140/80  Pulse: 72   Neurologic Exam Mental Status: Awake and fully alert. Oriented to place and time. Recent and remote memory intact. Attention span, concentration and fund of knowledge appropriate. Mood and affect appropriate.  Cranial Nerves: Fundoscopic exam  not done. Pupils equal, briskly reactive to light. Extraocular movements full without nystagmus. Visual fields full to confrontation. Hearing intact. Facial sensation intact. Face, tongue, palate moves normally and symmetrically.  Motor: Normal bulk and tone. Normal strength in all tested extremity muscles. Sensory.: intact to touch ,pinprick .position and vibratory sensation.  Coordination: Rapid alternating movements normal in all extremities. Finger-to-nose and heel-to-shin performed accurately bilaterally. Gait and Station: Arises from chair without difficulty. Stance is normal. Gait demonstrates normal stride length and balance . Able to heel, toe and tandem walk without difficulty.  Reflexes: 1+ and symmetric. Toes downgoing.       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   Initial left MCA aneurysm treatment complicated by small left MCA branch infarcts but he is made good neurological recovery with practically no deficits.  Vascular risk factors of smoking, hyperlipidemia and borderline hypertension.  Diagnostic cerebral catheter angiogram on 12/15 /2021 shows 50% restenosis in the stent assisted coiling of the left MCA aneurysm and 3.2 x 2 point by millimeter residual wide neck aneurysm projecting inferiorly and posteriorly from the previously treated right internal carotid artery aneurysm     PLAN: I had a long discussion with the patient regarding his cerebral aneurysm s/p stent assisted coiling and discussed findings of recent repeat cerebral catheter angiogram which showed 50% IntraStent stenosis of one of the treated aneurysms and a small residual aneurysm on the other side.  I strongly encouraged him to quit smoking completely and encourage use of nicotine patch and seek additional help from his primary physician for the same.  Keep strict control of hypertension blood pressure goal below 130/90.  Continue aspirin and Brilinta and Lipitor for stroke prevention.  Follow-up with Dr. Larene Beach for follow-up angiograms for his aneurysms.  Return for follow-up to see me in 6 months or call earlier if necessary. Greater than 50% of time during this 30 minute visit was spent on counseling,explanation of diagnosis subarachnoid hemorrhage, cerebral aneurysms and strokes, planning of further management, discussion with patient and family and coordination of care Delia Heady, MD  [facility] [address] [place], [place] [address]  Phone [phone] Fax [phone] Note: This document was prepared with digital dictation and possible smart phrase technology. Any transcriptional errors that result from this process are unintentional

## 2020-04-02 ENCOUNTER — Other Ambulatory Visit: Payer: Managed Care, Other (non HMO)

## 2020-04-06 ENCOUNTER — Other Ambulatory Visit: Payer: Self-pay | Admitting: Neurology

## 2020-06-05 ENCOUNTER — Telehealth: Payer: Self-pay | Admitting: Neurology

## 2020-06-05 NOTE — Telephone Encounter (Signed)
Telephone call to pt, Left a detailed message, After reviewing chart I do not see a Order for a CT ordered by Dr.Jaffe.

## 2020-06-05 NOTE — Telephone Encounter (Signed)
Patient's wife states they received a letter from [place] that Dr Everlena Cooper was requesting CT approval for patient. Caller states that patient and herself weren't aware that Dr Everlena Cooper was ordering anything for them. Calling to find out what is being ordered and why or if it's needed. Patient's insurance has changed and has been updated in Epic. Please call patient back as wife (caller) is going out of the country tomorrow for a week. [phone]

## 2020-09-28 ENCOUNTER — Ambulatory Visit: Payer: Medicare Other | Admitting: Neurology

## 2020-09-29 ENCOUNTER — Telehealth (HOSPITAL_COMMUNITY): Payer: Self-pay

## 2020-09-29 NOTE — Telephone Encounter (Signed)
Called to schedule diagnostic angiogram, no answer, left vm. AW  

## 2020-09-30 ENCOUNTER — Other Ambulatory Visit (HOSPITAL_COMMUNITY): Payer: Self-pay | Admitting: Student

## 2020-09-30 ENCOUNTER — Other Ambulatory Visit (HOSPITAL_COMMUNITY): Payer: Self-pay | Admitting: Neuroradiology

## 2020-09-30 ENCOUNTER — Other Ambulatory Visit (HOSPITAL_COMMUNITY): Payer: Self-pay | Admitting: Interventional Radiology

## 2020-09-30 DIAGNOSIS — I671 Cerebral aneurysm, nonruptured: Secondary | ICD-10-CM

## 2020-09-30 DIAGNOSIS — I609 Nontraumatic subarachnoid hemorrhage, unspecified: Secondary | ICD-10-CM

## 2020-09-30 MED ORDER — TICAGRELOR 90 MG PO TABS: 90.0000 mg | ORAL_TABLET | Freq: Every day | ORAL | 3 refills | Status: DC

## 2020-10-19 ENCOUNTER — Other Ambulatory Visit: Payer: Self-pay | Admitting: Physician Assistant

## 2020-10-19 NOTE — H&P (Signed)
Chief Complaint: Patient was seen in consultation today for diagnostic cerebral angiogram with moderate sedation.  Referring Physician(s): Victor Pena  Supervising Physician: Victor Pena  Patient Status: [facility] - Out-pt  History of Present Illness: Victor Pena is a 66 y.o. 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 Victor Pena is well known to [facility] having undergone left MCA aneurysm coiling on 08/06/19, left MCA aneurysm pipeline stent placement 08/19/19 and right ICA aneurysm embolization on 10/02/19 all with Victor Pena. He was last seen in [facility] on 03/17/20 for follow up diagnostic cerebral angiogram which noted:  Angiographically obliterated left middle cerebral artery bifurcation aneurysm without evidence of coil compaction or recanalization.   Evidence of approximately 50% intra stent stenosis at the site of the neck of the treated aneurysm.   3.2 mm x 2.1 mm residual aneurysm arise from the posterior wall of the right internal carotid artery at the level of the ophthalmic artery. This appears to be smaller than on the immediate post treatment angiogram.   Patent pipeline shield flow diverter device.   Approximately 3.8 mm double density in the caval cavernous segment of the right internal carotid artery at the proximal portion of the flow diverter. This is suspicious of a small aneurysm.  He was encouraged to quit tobacco use, continue on ASA 81 mg QD + Brilinta 90 mg BID at that time and return to [facility] for repeat diagnostic angiogram in 6 months, for which he presents today.  Past Medical History:  Diagnosis Date   Cerebral aneurysm    Diplopia    Intracranial aneurysm    Seasonal allergies    Wears glasses     Past Surgical History:  Procedure Laterality Date   BACK SURGERY     COLONOSCOPY     IR 3D  INDEPENDENT WKST  07/19/2019   IR 3D INDEPENDENT WKST  07/19/2019   IR 3D INDEPENDENT WKST  08/06/2019   IR 3D INDEPENDENT WKST  08/19/2019   IR 3D INDEPENDENT WKST  10/02/2019   IR ANGIO EXTERNAL CAROTID SEL EXT CAROTID BILAT MOD SED  07/19/2019   IR ANGIO INTRA EXTRACRAN SEL COM CAROTID INNOMINATE BILAT MOD SED  03/17/2020   IR ANGIO INTRA EXTRACRAN SEL INTERNAL CAROTID BILAT MOD SED  07/19/2019   IR ANGIO INTRA EXTRACRAN SEL INTERNAL CAROTID BILAT MOD SED  10/02/2019   IR ANGIO VERTEBRAL SEL SUBCLAVIAN INNOMINATE UNI L MOD SED  03/17/2020   IR ANGIO VERTEBRAL SEL VERTEBRAL BILAT MOD SED  07/19/2019   IR ANGIO VERTEBRAL SEL VERTEBRAL UNI R MOD SED  10/02/2019   IR ANGIO VERTEBRAL SEL VERTEBRAL UNI R MOD SED  03/17/2020   IR ANGIOGRAM FOLLOW UP STUDY  08/06/2019   IR ANGIOGRAM FOLLOW UP STUDY  08/06/2019   IR ANGIOGRAM FOLLOW UP STUDY  08/06/2019   IR ANGIOGRAM FOLLOW UP STUDY  08/06/2019   IR ANGIOGRAM FOLLOW UP STUDY  08/06/2019   IR ANGIOGRAM FOLLOW UP STUDY  08/06/2019   IR ANGIOGRAM FOLLOW UP STUDY  10/02/2019   IR CT HEAD LTD  08/06/2019   IR CT HEAD LTD  08/19/2019   IR CT HEAD LTD  10/02/2019   IR INTRA CRAN STENT  08/19/2019   IR NEURO EACH ADD'L AFTER BASIC UNI LEFT (MS)  08/06/2019   IR TRANSCATH/EMBOLIZ  08/06/2019   IR TRANSCATH/EMBOLIZ  10/02/2019  IR TRANSCATH/EMBOLIZ  10/02/2019   IR US GUIDE VASC ACCESS RIGHT  07/19/2019   IR US GUIDE VASC ACCESS RIGHT  08/06/2019   IR US GUIDE VASC ACCESS RIGHT  08/19/2019   IR US GUIDE VASC ACCESS RIGHT  10/02/2019   IR US GUIDE VASC ACCESS RIGHT  03/17/2020   RADIOLOGY WITH ANESTHESIA N/A 08/06/2019   Procedure: IR WITH ANESTHESIA;  Surgeon: Radiologist, Medication, MD;  Location: [facility] OR;  Service: Radiology;  Laterality: N/A;   RADIOLOGY WITH ANESTHESIA N/A 08/19/2019   Procedure: STENT PLACEMENT;  Surgeon: Victor Lenis, MD;  Location: [facility] OR;  Service: Radiology;  Laterality: N/A;   RADIOLOGY WITH ANESTHESIA N/A 10/02/2019   Procedure: RADIOLOGY  WITH ANESTHESIA  EMBOLIZATION;  Surgeon: Victor Lenis, MD;  Location: [facility] OR;  Service: Radiology;  Laterality: N/A;   WISDOM TOOTH EXTRACTION      Allergies: Other  Medications: Prior to Admission medications   Medication Sig Start Date End Date Taking? Authorizing Provider  acetaminophen (TYLENOL) 500 MG tablet Take 1,000 mg by mouth every 6 (six) hours as needed (Back pain).   Yes [provider]  aspirin 81 MG EC tablet Take 81 mg by mouth daily. Swallow whole.   Yes [provider]  atorvastatin (LIPITOR) 40 MG tablet TAKE 1 TABLET BY MOUTH EVERY DAY Patient taking differently: Take 40 mg by mouth daily. 04/06/20  Yes Victor Riley, MD  ticagrelor (BRILINTA) 90 MG TABS tablet Take 1 tablet (90 mg total) by mouth daily. 09/30/20  Yes Victor Pena  calcipotriene-betamethasone [facility]) external suspension Apply topically daily. Patient not taking: Reported on 10/14/2020 09/05/19   Victor Pena  nicotine (NICODERM CQ - DOSED IN MG/24 HOURS) 14 mg/24hr patch Place 1 patch (14 mg total) onto the skin daily. Patient not taking: Reported on 10/14/2020 08/21/19   Victor Pena     Family History  Problem Relation Age of Onset   Aneurysm Father     Social History   Socioeconomic History   Marital status: Married    Spouse name: Not on file   Number of children: 4   Years of education: 16   Highest education level: Not on file  Occupational History   Occupation: owner   Tobacco Use   Smoking status: Every Day    Packs/day: 0.25    Types: Cigarettes   Smokeless tobacco: Never  Vaping Use   Vaping Use: Never used  Substance and Sexual Activity   Alcohol use: Yes    Alcohol/week: 10.0 standard drinks    Types: 10 Glasses of wine per week    Comment: one to two glasses or wine per night   Drug use: Not Currently   Sexual activity: Not on file  Other Topics Concern   Not on file  Social History Narrative    Right handed   3 story home   Drinks caffeine   Social Determinants of Health   Financial Resource Strain: Not on file  Food Insecurity: Not on file  Transportation Needs: Not on file  Physical Activity: Not on file  Stress: Not on file  Social Connections: Not on file     Review of Systems: A 12 point ROS discussed and pertinent positives are indicated in the HPI above.  All other systems are negative.  Review of Systems  Constitutional:  Negative for chills and fever.  Eyes:  Negative for visual disturbance.  Respiratory:  Negative for cough and  shortness of breath.   Cardiovascular:  Negative for chest pain.  Gastrointestinal:  Negative for abdominal pain, nausea and vomiting.  Musculoskeletal:  Negative for back pain.  Neurological:  Positive for numbness (right arm - since stroke). Negative for facial asymmetry, speech difficulty and headaches.   Vital Signs: BP (!) 143/91   Pulse 65   Temp 98 F (36.7 C) (Oral)   Resp 16   Ht 5\' 11"  (1.803 m)   Wt 180 lb (81.6 kg)   SpO2 98%   BMI 25.10 kg/m   Physical Exam Vitals reviewed.  Constitutional:      General: [name] not in acute distress. HENT:     Head: Normocephalic.     Mouth/Throat:     Mouth: Mucous membranes are moist.     Pharynx: Oropharynx is clear. No oropharyngeal exudate or posterior oropharyngeal erythema.  Cardiovascular:     Rate and Rhythm: Normal rate and regular rhythm.  Pulmonary:     Effort: Pulmonary effort is normal.     Breath sounds: Normal breath sounds.  Abdominal:     General: There is no distension.     Palpations: Abdomen is soft.     Tenderness: There is no abdominal tenderness.  Skin:    General: Skin is warm and dry.  Neurological:     Mental Status: [name] alert and oriented to person, place, and time.     Cranial Nerves: No cranial nerve deficit.  Psychiatric:        Mood and Affect: Mood normal.        Behavior: Behavior normal.        Thought Content: Thought content  normal.        Judgment: Judgment normal.         Imaging: No results found.  Labs:  CBC: Recent Labs    03/17/20 0715  WBC 6.7  HGB 15.0  HCT 45.4  PLT 321    COAGS: Recent Labs    03/17/20 0715  INR 0.9  APTT 27    BMP: Recent Labs    03/17/20 0715  NA 141  K 4.2  CL 102  CO2 23  GLUCOSE 101*  BUN 13  CALCIUM 10.0  CREATININE 0.91  GFRNONAA >60    LIVER FUNCTION TESTS: No results for input(s): BILITOT, AST, ALT, ALKPHOS, PROT, ALBUMIN in the last 8760 hours.  TUMOR MARKERS: No results for input(s): AFPTM, CEA, CA199, CHROMGRNA in the last 8760 hours.  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  Patient has been NPO since midnight, currently taking ASA 81 mg QD + Brilinta 90 mg BID. Afebrile, WBC 7.3, hgb 15.7, plt 350, INR 0.9, creatinine 0.91.  Risks and benefits of diagnostic cerebral angiogram were discussed with the patient including, but not limited to bleeding, infection, vascular injury, stroke, or contrast induced renal failure.  This interventional procedure involves the use of X-rays and because of the nature of the planned procedure, it is possible that we will have prolonged use of X-ray fluoroscopy. Potential radiation risks to you include (but are not limited to) the following: - A slightly elevated risk for cancer  several years later in life. This  risk is typically less than 0.5% percent. This risk is low in comparison to the normal incidence of human cancer, which is 33% for women and 50% for men according to the [employer] Society. - Radiation induced injury can include skin redness,  resembling a rash, tissue breakdown / ulcers and hair loss (which can be temporary or permanent).  The likelihood of either of these occurring depends on the difficulty of the procedure and whether you are sensitive to radiation due to previous procedures, disease, or genetic conditions.  IF your procedure requires a prolonged use of radiation, you will be notified and given written instructions for further action.  It is your responsibility to monitor the irradiated area for the 2 weeks following the procedure and to notify your physician if you are concerned that you have suffered a radiation induced injury.    All of the patient's questions were answered, patient is agreeable to proceed.  Consent signed and in chart.  Thank you for this interesting consult.  I greatly enjoyed meeting Victor Pena and look forward to participating in their care.  A copy of this report was sent to the requesting provider on this date.  Electronically Signed: Villa Herb, Pena 10/19/2020, 12:55 PM   I spent a total of 25 Minutes in face to face in clinical consultation, greater than 50% of which was counseling/coordinating care for diagnostic cerebral angiogram.

## 2020-10-20 ENCOUNTER — Other Ambulatory Visit (HOSPITAL_COMMUNITY): Payer: Self-pay | Admitting: Neuroradiology

## 2020-10-20 ENCOUNTER — Ambulatory Visit (HOSPITAL_COMMUNITY)
Admission: RE | Admit: 2020-10-20 | Discharge: 2020-10-20 | Disposition: A | Discharge status: Home / Self Care | Payer: Medicare Other | Source: Ambulatory Visit | Attending: Neuroradiology | Admitting: Neuroradiology

## 2020-10-20 DIAGNOSIS — I671 Cerebral aneurysm, nonruptured: Secondary | ICD-10-CM | POA: Diagnosis not present

## 2020-10-20 DIAGNOSIS — I609 Nontraumatic subarachnoid hemorrhage, unspecified: Secondary | ICD-10-CM | POA: Insufficient documentation

## 2020-10-20 DIAGNOSIS — Z7982 Long term (current) use of aspirin: Secondary | ICD-10-CM | POA: Diagnosis not present

## 2020-10-20 DIAGNOSIS — Z8679 Personal history of other diseases of the circulatory system: Secondary | ICD-10-CM | POA: Diagnosis not present

## 2020-10-20 DIAGNOSIS — I6602 Occlusion and stenosis of left middle cerebral artery: Secondary | ICD-10-CM | POA: Insufficient documentation

## 2020-10-20 DIAGNOSIS — Z79899 Other long term (current) drug therapy: Secondary | ICD-10-CM | POA: Insufficient documentation

## 2020-10-20 DIAGNOSIS — Z8249 Family history of ischemic heart disease and other diseases of the circulatory system: Secondary | ICD-10-CM | POA: Insufficient documentation

## 2020-10-20 DIAGNOSIS — F17210 Nicotine dependence, cigarettes, uncomplicated: Secondary | ICD-10-CM | POA: Diagnosis not present

## 2020-10-20 HISTORY — PX: IR US GUIDE VASC ACCESS RIGHT: IMG2390

## 2020-10-20 HISTORY — PX: IR ANGIO INTRA EXTRACRAN SEL INTERNAL CAROTID BILAT MOD SED: IMG5363

## 2020-10-20 HISTORY — PX: IR ANGIO VERTEBRAL SEL VERTEBRAL BILAT MOD SED: IMG5369

## 2020-10-20 LAB — CBC
HCT: 46.7 % (ref 39.0–52.0)
Hemoglobin: 15.7 g/dL (ref 13.0–17.0)
MCH: 33.9 pg (ref 26.0–34.0)
MCHC: 33.6 g/dL (ref 30.0–36.0)
MCV: 100.9 fL — ABNORMAL HIGH (ref 80.0–100.0)
Platelets: 350 10*3/uL (ref 150–400)
RBC: 4.63 MIL/uL (ref 4.22–5.81)
RDW: 13.5 % (ref 11.5–15.5)
WBC: 7.3 10*3/uL (ref 4.0–10.5)
nRBC: 0 % (ref 0.0–0.2)

## 2020-10-20 LAB — BASIC METABOLIC PANEL
Anion gap: 11 (ref 5–15)
BUN: 8 mg/dL (ref 8–23)
CO2: 25 mmol/L (ref 22–32)
Calcium: 9.9 mg/dL (ref 8.9–10.3)
Chloride: 102 mmol/L (ref 98–111)
Creatinine, Ser: 0.89 mg/dL (ref 0.61–1.24)
GFR, Estimated: >60 mL/min (ref >60)
Glucose, Bld: 99 mg/dL (ref 70–99)
Potassium: 3.9 mmol/L (ref 3.5–5.1)
Sodium: 138 mmol/L (ref 135–145)

## 2020-10-20 LAB — PROTIME-INR
INR: 0.9 (ref 0.8–1.2)
Prothrombin Time: 12.5 seconds (ref 11.4–15.2)

## 2020-10-20 MED ORDER — NITROGLYCERIN 1 MG/10 ML FOR IR/CATH LAB
INTRA_ARTERIAL | Status: AC
  Filled 2020-10-20: qty 10

## 2020-10-20 MED ORDER — SODIUM CHLORIDE 0.9 % IV SOLN
INTRAVENOUS | Status: AC | PRN
  Administered 2020-10-20: 10 mL/h via INTRAVENOUS

## 2020-10-20 MED ORDER — IOHEXOL 300 MG/ML  SOLN
100.0000 mL | Freq: Once | INTRAMUSCULAR | Status: AC | PRN
  Administered 2020-10-20: 60 mL via INTRA_ARTERIAL

## 2020-10-20 MED ORDER — LIDOCAINE HCL 1 % IJ SOLN
INTRAMUSCULAR | Status: AC
  Filled 2020-10-20: qty 20

## 2020-10-20 MED ORDER — FENTANYL CITRATE (PF) 100 MCG/2ML IJ SOLN
INTRAMUSCULAR | Status: AC | PRN
  Administered 2020-10-20: 25 ug via INTRAVENOUS

## 2020-10-20 MED ORDER — HEPARIN SODIUM (PORCINE) 1000 UNIT/ML IJ SOLN
INTRAMUSCULAR | Status: AC
  Filled 2020-10-20: qty 1

## 2020-10-20 MED ORDER — MIDAZOLAM HCL 2 MG/2ML IJ SOLN
INTRAMUSCULAR | Status: AC | PRN
  Administered 2020-10-20: 1 mg via INTRAVENOUS

## 2020-10-20 MED ORDER — LIDOCAINE HCL (PF) 1 % IJ SOLN
INTRAMUSCULAR | Status: AC | PRN
  Administered 2020-10-20: 1 mL

## 2020-10-20 MED ORDER — IOHEXOL 240 MG/ML SOLN
50.0000 mL | Freq: Once | INTRAMUSCULAR | Status: AC | PRN
  Administered 2020-10-20: 36 mL via INTRAVENOUS

## 2020-10-20 MED ORDER — MIDAZOLAM HCL 2 MG/2ML IJ SOLN
INTRAMUSCULAR | Status: AC
  Filled 2020-10-20: qty 2

## 2020-10-20 MED ORDER — VERAPAMIL HCL 2.5 MG/ML IV SOLN
INTRAVENOUS | Status: AC
  Filled 2020-10-20: qty 2

## 2020-10-20 MED ORDER — SODIUM CHLORIDE 0.9 % IV SOLN: INTRAVENOUS | Status: DC

## 2020-10-20 MED ORDER — FENTANYL CITRATE (PF) 100 MCG/2ML IJ SOLN
INTRAMUSCULAR | Status: AC
  Filled 2020-10-20: qty 2

## 2020-10-20 MED ORDER — VERAPAMIL HCL 2.5 MG/ML IV SOLN: INTRA_ARTERIAL | Status: AC | PRN

## 2020-10-20 NOTE — Discharge Instructions (Signed)
STOP BRILLINTA

## 2020-10-20 NOTE — Procedures (Addendum)
INTERVENTIONAL NEURORADIOLOGY BRIEF POSTPROCEDURE NOTE   Diagnostic cerebral angiogram   Attending: Dr. Baldemar Lenis   Assistant: None.   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   Access site: Distal right radial artery.   Access closure: Inflatable band.   Anesthesia: Moderate sedation.   Medication used: 1 mg Versed IV; 25 mcg Fentanyl IV.   Complications: None.   Estimated blood loss: Negligible.   Specimen: None.   Findings: Status post stent assisted coiling left MCA bifurcation aneurysm.  There is no evidence of residual/recurrent aneurysm or coil compaction.  The intracranial stent widely open without hemodynamically significant stenosis. Status post placement of a flow diverter in the intracranial right ICA for treatment of a right paraophthalmic aneurysm.  A 3.1 x 1.3 mm residual aneurysm is noted, compared to 3.2 x 2.1 mm on prior.  No evidence of implant stenosis. No new aneurysm identified.  Patient may discontinue brilinta while remaining on ASA 81 mg daily.   The patient tolerated the procedure well without incident or complication andis in stable condition.

## 2020-10-20 NOTE — Progress Notes (Signed)
Spoke to Dr Tommi Rumps Melchor Amour and notified her client's med reconciliation not done; per Dr Tommi Rumps Melchor Amour client to stop brillinta

## 2020-10-23 IMAGING — MR MR HEAD W/O CM
10 of 11 series · 43 of 48 positions shown · non-contrast
Comparison: Prior head CT from 08/06/2019.

CLINICAL DATA: Initial evaluation for worsened speech, status post
coil embolization of MCA aneurysm.

EXAM:
MRI HEAD WITHOUT CONTRAST
TECHNIQUE: Multiplanar, multiecho pulse sequences of the brain and surrounding
structures were obtained without intravenous contrast.

[Series 5: DWI · axial · 3.0mm · 0.88mm/px · z∈[-84,+62]mm · 10 of 100 slices shown (1 of 4)]
[im 1/100]
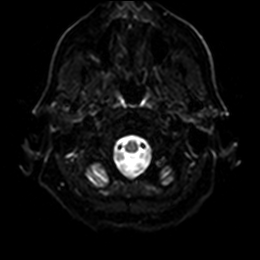
[im 12/100]
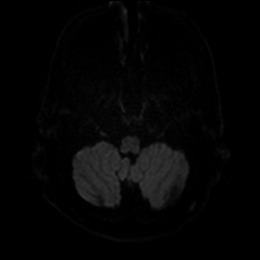
[im 23/100]
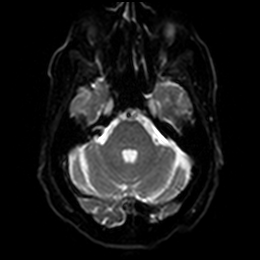
[im 34/100]
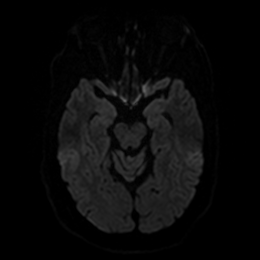
[im 45/100]
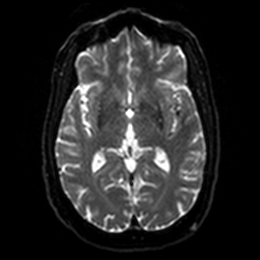
[im 56/100]
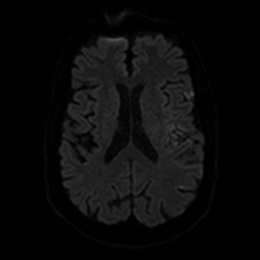
[im 67/100]
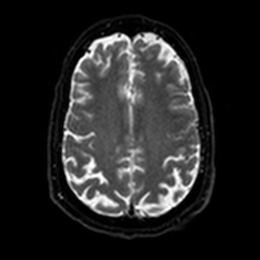
[im 78/100]
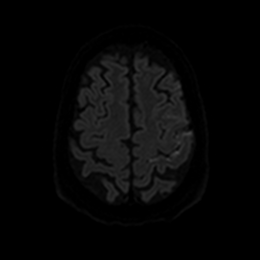
[im 89/100]
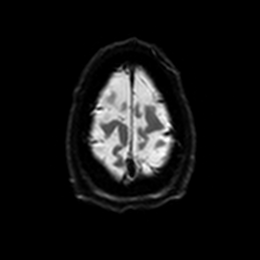
[im 100/100]
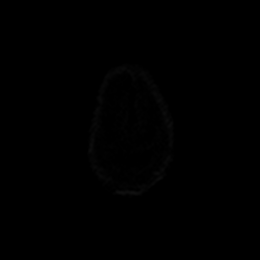

[Series 6: DWI · axial · 3.0mm · 0.88mm/px · z∈[-84,+62]mm · 5 of 50 slices shown (2 of 4)]
[im 1/50]
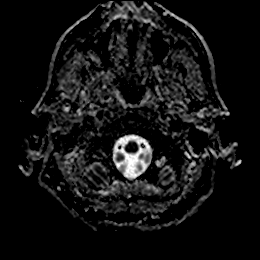
[im 13/50]
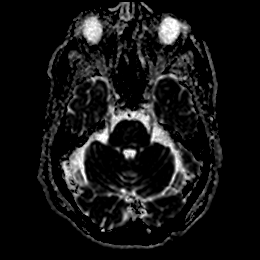
[im 25/50]
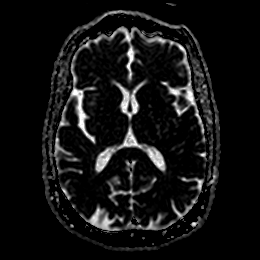
[im 37/50]
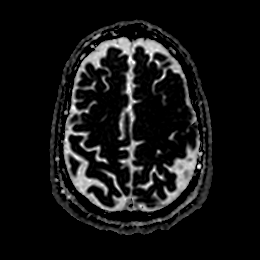
[im 50/50]
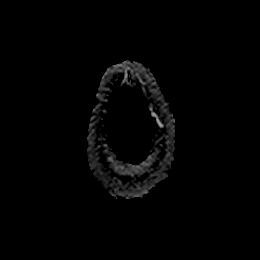

[Series 7: DWI · coronal · 4.0mm · 0.88mm/px · 6 of 68 slices shown (3 of 4)]
[im 1/68]
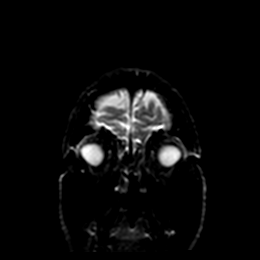
[im 14/68]
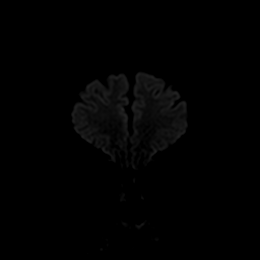
[im 27/68]
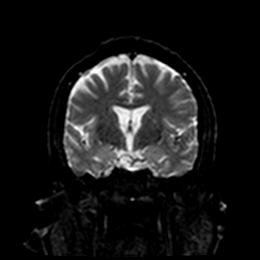
[im 41/68]
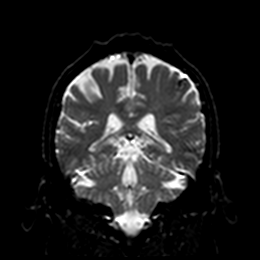
[im 54/68]
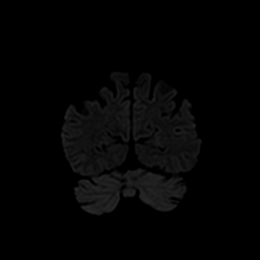
[im 68/68]
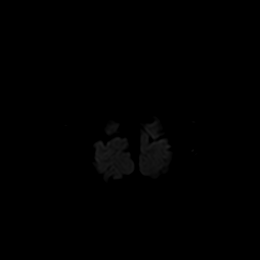

[Series 8: DWI · coronal · 4.0mm · 0.88mm/px · 3 of 34 slices shown (4 of 4)]
[im 1/34]
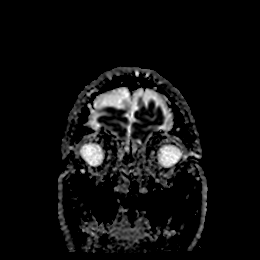
[im 17/34]
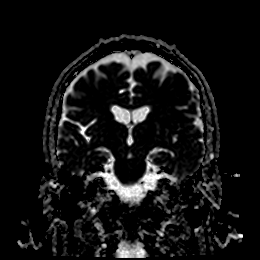
[im 34/34]
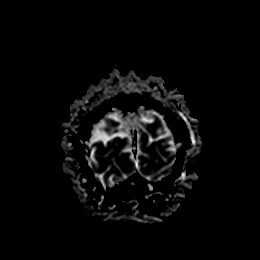

[Series 9: T1 · sagittal · 5.0mm · 0.78mm/px · 2 of 25 slices shown]
[im 1/25]
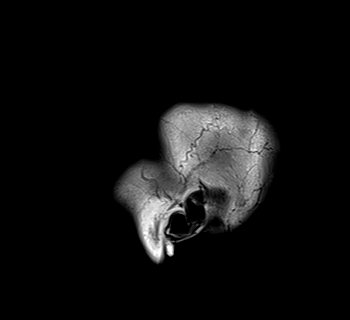
[im 25/25]
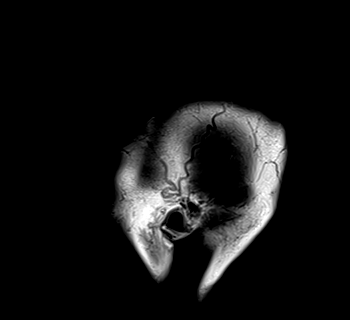

[Series 10: T2 · axial · 5.0mm · 0.72mm/px · z∈[-83,+60]mm · 2 of 25 slices shown (1 of 2)]
[im 1/25]
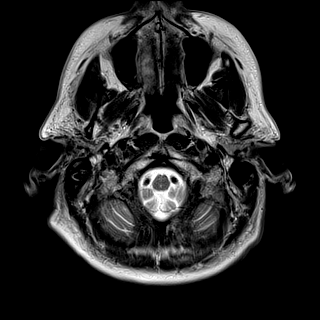
[im 25/25]
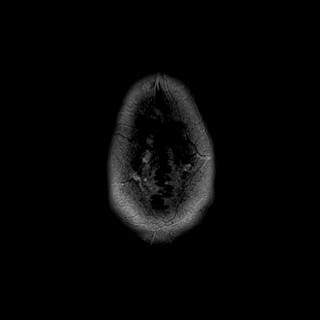

[Series 11: FLAIR · axial · 5.0mm · 0.45mm/px · z∈[-83,+60]mm · 2 of 25 slices shown]
[im 1/25]
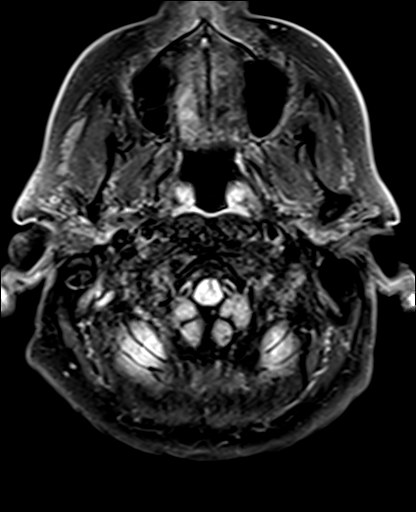
[im 25/25]
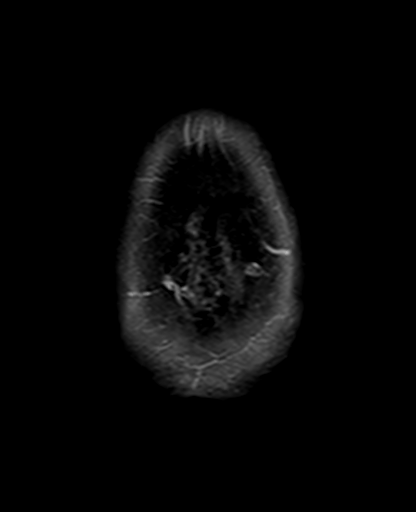

[Series 13: pha_images · axial · 3.0mm · 0.90mm/px · z∈[-100,+76]mm · 5 of 60 slices shown]
[im 1/60]
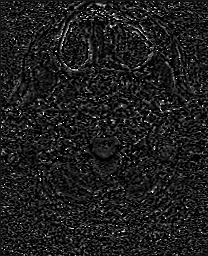
[im 15/60]
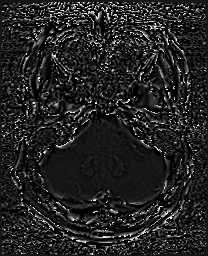
[im 30/60]
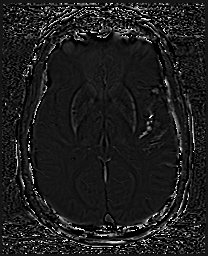
[im 45/60]
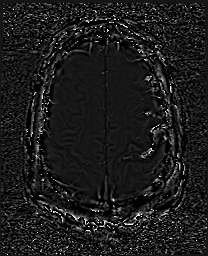
[im 60/60]
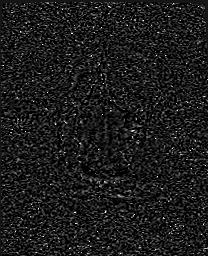

[Series 14: swi_images · axial · 3.0mm · 0.90mm/px · z∈[-100,+76]mm · 5 of 60 slices shown]
[im 1/60]
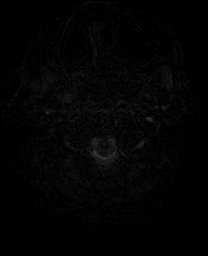
[im 15/60]
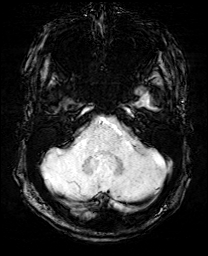
[im 30/60]
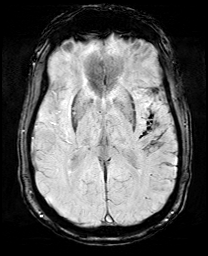
[im 45/60]
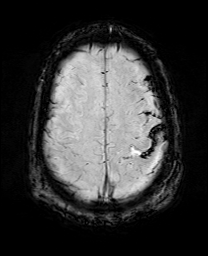
[im 60/60]
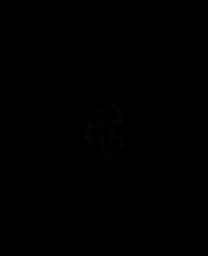

[Series 17: T2 · coronal · 5.0mm · 0.34mm/px · 3 of 31 slices shown (2 of 2)]
[im 1/31]
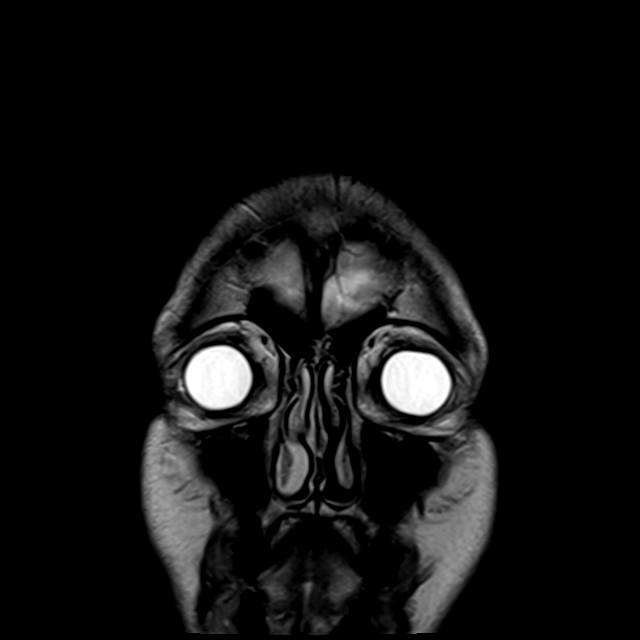
[im 16/31]
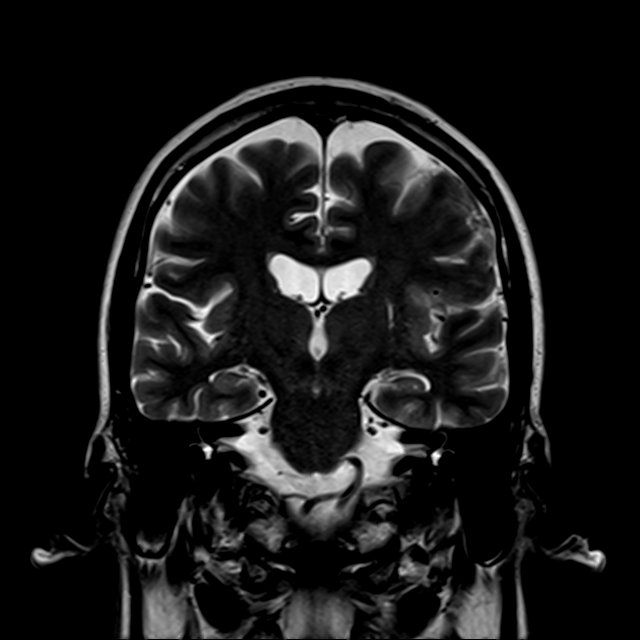
[im 31/31]
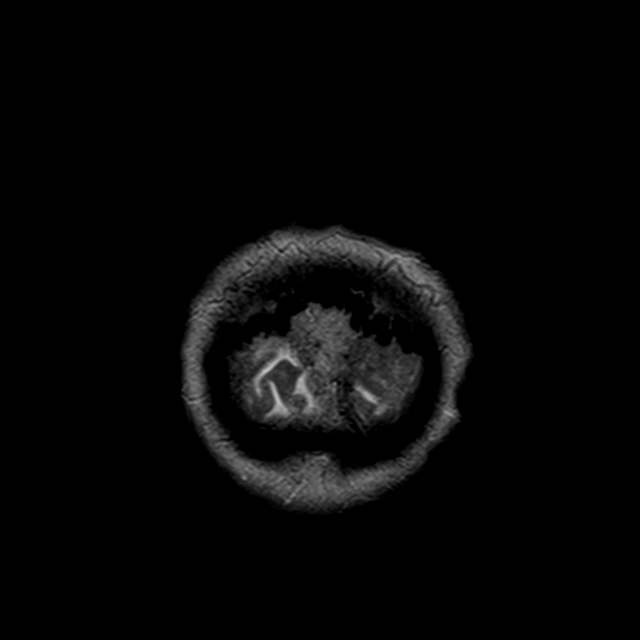

[43 of 48 positions shown; findings below may reference images not displayed]

FINDINGS: Brain: Mild age-related cerebral atrophy. Scattered susceptibility
artifact and T1 hyperintensity seen throughout the left sylvian
fissure and overlying left frontoparietal sulci, consistent with
subarachnoid hemorrhage, similar to previous CT. Scattered
serpiginous diffusion signal abnormality seen throughout this
region, favored in large part to be related to the underlying
subarachnoid blood, although superimposed small volume ischemic
changes could be present as well, difficult to exclude. There are a
few scattered punctate foci of diffusion abnormality involving the
cortical gray matter of the parasagittal left frontal lobe (series
5, image 93) and left parietal lobe (series 5, images 86, 87, likely
reflecting punctate ischemic infarcts. No frank intraparenchymal
hematoma or mass effect.

No other diffusion abnormality to suggest acute or subacute
ischemia. Gray-white matter differentiation otherwise maintained. No
other areas of remote cortical infarction. No other evidence for
acute or chronic intracranial hemorrhage.

No mass lesion or midline shift. No hydrocephalus or extra-axial
collection. Midline structures intact. Pituitary gland suprasellar
region normal.

Vascular: Susceptibility artifact related to coiled left MCA
bifurcation aneurysm. Major intracranial vascular flow voids
maintained elsewhere.

Skull and upper cervical spine: Craniocervical junction within
normal limits. Upper cervical spine normal. Bone marrow signal
intensity within normal limits. No scalp soft tissue abnormality.

Sinuses/Orbits: Globes and orbital soft tissues within normal
limits. Mild scattered mucosal thickening noted within the ethmoidal
air cells. Paranasal sinuses are otherwise largely clear. Trace
right mastoid effusion. Inner ear structures grossly normal.

Other: None.
IMPRESSION: 1. Scattered subarachnoid hemorrhage involving the left sylvian
fissure and overlying left frontal parietal convexity, relatively
stable from prior CT, and consistent with recently ruptured left MCA
bifurcation aneurysm.
2. Few probable punctate ischemic nonhemorrhagic infarcts involving
the left frontal and parietal lobes as above. Additional scattered
serpiginous diffusion abnormality favored to in large part be
related to the underlying subarachnoid hemorrhage.
3. Sequelae of interval coil embolization of left MCA bifurcation
aneurysm.

## 2020-11-04 ENCOUNTER — Inpatient Hospital Stay (HOSPITAL_COMMUNITY): Payer: Medicare Other

## 2020-11-04 ENCOUNTER — Other Ambulatory Visit (HOSPITAL_COMMUNITY): Payer: Medicare Other

## 2020-11-04 ENCOUNTER — Other Ambulatory Visit: Payer: Self-pay

## 2020-11-04 ENCOUNTER — Emergency Department (HOSPITAL_COMMUNITY): Payer: Medicare Other

## 2020-11-04 ENCOUNTER — Encounter: Payer: Self-pay | Admitting: Adult Health

## 2020-11-04 ENCOUNTER — Inpatient Hospital Stay (HOSPITAL_COMMUNITY)
Admission: EM | Admit: 2020-11-04 | Discharge: 2020-11-06 | DRG: 123 | Disposition: A | Discharge status: Home / Self Care | Payer: Medicare Other | Attending: Neurology | Admitting: Neurology

## 2020-11-04 ENCOUNTER — Ambulatory Visit: Payer: Self-pay | Admitting: Adult Health

## 2020-11-04 DIAGNOSIS — H5461 Unqualified visual loss, right eye, normal vision left eye: Secondary | ICD-10-CM | POA: Diagnosis present

## 2020-11-04 DIAGNOSIS — Z20822 Contact with and (suspected) exposure to covid-19: Secondary | ICD-10-CM | POA: Diagnosis not present

## 2020-11-04 DIAGNOSIS — E785 Hyperlipidemia, unspecified: Secondary | ICD-10-CM | POA: Diagnosis present

## 2020-11-04 DIAGNOSIS — S066X0A Traumatic subarachnoid hemorrhage without loss of consciousness, initial encounter: Secondary | ICD-10-CM | POA: Diagnosis not present

## 2020-11-04 DIAGNOSIS — I6389 Other cerebral infarction: Secondary | ICD-10-CM | POA: Diagnosis not present

## 2020-11-04 DIAGNOSIS — S0003XA Contusion of scalp, initial encounter: Secondary | ICD-10-CM | POA: Diagnosis not present

## 2020-11-04 DIAGNOSIS — H3411 Central retinal artery occlusion, right eye: Principal | ICD-10-CM | POA: Diagnosis present

## 2020-11-04 DIAGNOSIS — Z7982 Long term (current) use of aspirin: Secondary | ICD-10-CM | POA: Diagnosis not present

## 2020-11-04 DIAGNOSIS — I609 Nontraumatic subarachnoid hemorrhage, unspecified: Secondary | ICD-10-CM | POA: Diagnosis not present

## 2020-11-04 DIAGNOSIS — H53131 Sudden visual loss, right eye: Secondary | ICD-10-CM

## 2020-11-04 DIAGNOSIS — Z9282 Status post administration of tPA (rtPA) in a different facility within the last 24 hours prior to admission to current facility: Secondary | ICD-10-CM | POA: Diagnosis not present

## 2020-11-04 DIAGNOSIS — I63233 Cerebral infarction due to unspecified occlusion or stenosis of bilateral carotid arteries: Secondary | ICD-10-CM | POA: Diagnosis not present

## 2020-11-04 DIAGNOSIS — G4489 Other headache syndrome: Secondary | ICD-10-CM | POA: Diagnosis not present

## 2020-11-04 DIAGNOSIS — F17210 Nicotine dependence, cigarettes, uncomplicated: Secondary | ICD-10-CM | POA: Diagnosis not present

## 2020-11-04 DIAGNOSIS — I6340 Cerebral infarction due to embolism of unspecified cerebral artery: Secondary | ICD-10-CM | POA: Insufficient documentation

## 2020-11-04 DIAGNOSIS — H3410 Central retinal artery occlusion, unspecified eye: Secondary | ICD-10-CM | POA: Diagnosis present

## 2020-11-04 DIAGNOSIS — Z79899 Other long term (current) drug therapy: Secondary | ICD-10-CM

## 2020-11-04 DIAGNOSIS — R0902 Hypoxemia: Secondary | ICD-10-CM | POA: Diagnosis not present

## 2020-11-04 DIAGNOSIS — Z23 Encounter for immunization: Secondary | ICD-10-CM

## 2020-11-04 DIAGNOSIS — I619 Nontraumatic intracerebral hemorrhage, unspecified: Secondary | ICD-10-CM | POA: Diagnosis not present

## 2020-11-04 LAB — CBC WITH DIFFERENTIAL/PLATELET
Abs Immature Granulocytes: 0.01 10*3/uL (ref 0.00–0.07)
Basophils Absolute: 0 10*3/uL (ref 0.0–0.1)
Basophils Relative: 1 %
Eosinophils Absolute: 0.3 10*3/uL (ref 0.0–0.5)
Eosinophils Relative: 4 %
HCT: 40.8 % (ref 39.0–52.0)
Hemoglobin: 13.9 g/dL (ref 13.0–17.0)
Immature Granulocytes: 0 %
Lymphocytes Relative: 20 %
Lymphs Abs: 1.3 10*3/uL (ref 0.7–4.0)
MCH: 34.4 pg — ABNORMAL HIGH (ref 26.0–34.0)
MCHC: 34.1 g/dL (ref 30.0–36.0)
MCV: 101 fL — ABNORMAL HIGH (ref 80.0–100.0)
Monocytes Absolute: 0.9 10*3/uL (ref 0.1–1.0)
Monocytes Relative: 13 %
Neutro Abs: 4.1 10*3/uL (ref 1.7–7.7)
Neutrophils Relative %: 62 %
Platelets: 258 10*3/uL (ref 150–400)
RBC: 4.04 MIL/uL — ABNORMAL LOW (ref 4.22–5.81)
RDW: 13.2 % (ref 11.5–15.5)
WBC: 6.5 10*3/uL (ref 4.0–10.5)
nRBC: 0 % (ref 0.0–0.2)

## 2020-11-04 LAB — PROTIME-INR
INR: 1 (ref 0.8–1.2)
Prothrombin Time: 13 seconds (ref 11.4–15.2)

## 2020-11-04 LAB — HIV ANTIBODY (ROUTINE TESTING W REFLEX): HIV Screen 4th Generation wRfx: NONREACTIVE

## 2020-11-04 LAB — URINALYSIS, ROUTINE W REFLEX MICROSCOPIC
Bacteria, UA: NONE SEEN
Bilirubin Urine: NEGATIVE
Glucose, UA: NEGATIVE mg/dL
Ketones, ur: NEGATIVE mg/dL
Leukocytes,Ua: NEGATIVE
Nitrite: NEGATIVE
Protein, ur: NEGATIVE mg/dL
Specific Gravity, Urine: 1.025 (ref 1.005–1.030)
pH: 7 (ref 5.0–8.0)

## 2020-11-04 LAB — COMPREHENSIVE METABOLIC PANEL
ALT: 28 U/L (ref 0–44)
AST: 34 U/L (ref 15–41)
Albumin: 3.6 g/dL (ref 3.5–5.0)
Alkaline Phosphatase: 55 U/L (ref 38–126)
Anion gap: 9 (ref 5–15)
BUN: 12 mg/dL (ref 8–23)
CO2: 22 mmol/L (ref 22–32)
Calcium: 9 mg/dL (ref 8.9–10.3)
Chloride: 105 mmol/L (ref 98–111)
Creatinine, Ser: 0.88 mg/dL (ref 0.61–1.24)
GFR, Estimated: >60 mL/min (ref >60)
Glucose, Bld: 89 mg/dL (ref 70–99)
Potassium: 4.3 mmol/L (ref 3.5–5.1)
Sodium: 136 mmol/L (ref 135–145)
Total Bilirubin: 0.8 mg/dL (ref 0.3–1.2)
Total Protein: 6.5 g/dL (ref 6.5–8.1)

## 2020-11-04 LAB — RESP PANEL BY RT-PCR (FLU A&B, COVID) ARPGX2
Influenza A by PCR: NEGATIVE
Influenza B by PCR: NEGATIVE
SARS Coronavirus 2 by RT PCR: NEGATIVE

## 2020-11-04 LAB — LIPID PANEL
Cholesterol: 131 mg/dL (ref 0–200)
HDL: 56 mg/dL (ref >40)
LDL Cholesterol: 68 mg/dL (ref 0–99)
Total CHOL/HDL Ratio: 2.3 RATIO
Triglycerides: 33 mg/dL (ref <150)
VLDL: 7 mg/dL (ref 0–40)

## 2020-11-04 LAB — HEMOGLOBIN A1C
Hgb A1c MFr Bld: 5.5 % (ref 4.8–5.6)
Mean Plasma Glucose: 111.15 mg/dL

## 2020-11-04 LAB — SEDIMENTATION RATE: Sed Rate: 2 mm/hr (ref 0–16)

## 2020-11-04 LAB — MRSA NEXT GEN BY PCR, NASAL: MRSA by PCR Next Gen: NOT DETECTED

## 2020-11-04 LAB — C-REACTIVE PROTEIN: CRP: <0.5 mg/dL (ref <1.0)

## 2020-11-04 LAB — TYPE AND SCREEN
ABO/RH(D): O NEG
Antibody Screen: NEGATIVE

## 2020-11-04 LAB — APTT: aPTT: 27 seconds (ref 24–36)

## 2020-11-04 LAB — FIBRINOGEN: Fibrinogen: 242 mg/dL (ref 210–475)

## 2020-11-04 LAB — CBG MONITORING, ED: Glucose-Capillary: 92 mg/dL (ref 70–99)

## 2020-11-04 MED ORDER — LABETALOL HCL 5 MG/ML IV SOLN: 10.0000 mg | Freq: Once | INTRAVENOUS | Status: AC

## 2020-11-04 MED ORDER — STROKE: EARLY STAGES OF RECOVERY BOOK
Freq: Once | Status: AC
  Filled 2020-11-04: qty 1

## 2020-11-04 MED ORDER — IOHEXOL 350 MG/ML SOLN
60.0000 mL | Freq: Once | INTRAVENOUS | Status: AC | PRN
  Administered 2020-11-04: 60 mL via INTRAVENOUS

## 2020-11-04 MED ORDER — PNEUMOCOCCAL VAC POLYVALENT 25 MCG/0.5ML IJ INJ
0.5000 mL | INJECTION | INTRAMUSCULAR | Status: AC
  Administered 2020-11-06: 0.5 mL via INTRAMUSCULAR
  Filled 2020-11-04: qty 0.5

## 2020-11-04 MED ORDER — LABETALOL HCL 5 MG/ML IV SOLN
INTRAVENOUS | Status: AC
  Administered 2020-11-04: 10 mg via INTRAVENOUS
  Filled 2020-11-04: qty 4

## 2020-11-04 MED ORDER — TRANEXAMIC ACID-NACL 1000-0.7 MG/100ML-% IV SOLN
1000.0000 mg | Freq: Once | INTRAVENOUS | Status: AC
  Administered 2020-11-04: 1000 mg via INTRAVENOUS
  Filled 2020-11-04: qty 100

## 2020-11-04 MED ORDER — ACETAMINOPHEN 160 MG/5ML PO SOLN: 650.0000 mg | ORAL | Status: DC | PRN

## 2020-11-04 MED ORDER — PANTOPRAZOLE SODIUM 40 MG IV SOLR
40.0000 mg | Freq: Every day | INTRAVENOUS | Status: DC
  Administered 2020-11-04: 40 mg via INTRAVENOUS
  Filled 2020-11-04: qty 40

## 2020-11-04 MED ORDER — LABETALOL HCL 5 MG/ML IV SOLN
20.0000 mg | Freq: Once | INTRAVENOUS | Status: DC
  Filled 2020-11-04: qty 4

## 2020-11-04 MED ORDER — ALTEPLASE (STROKE) FULL DOSE INFUSION
0.9000 mg/kg | Freq: Once | INTRAVENOUS | Status: AC
  Administered 2020-11-04: 76.3 mg via INTRAVENOUS

## 2020-11-04 MED ORDER — SENNOSIDES-DOCUSATE SODIUM 8.6-50 MG PO TABS
1.0000 | ORAL_TABLET | Freq: Two times a day (BID) | ORAL | Status: DC
  Administered 2020-11-04: 1 via ORAL
  Filled 2020-11-04 (×2): qty 1

## 2020-11-04 MED ORDER — SODIUM CHLORIDE 0.9 % IV SOLN
Freq: Once | INTRAVENOUS | Status: DC
Start: 2020-11-04 — End: 2020-11-06

## 2020-11-04 MED ORDER — SODIUM CHLORIDE 0.9 % IV SOLN
50.0000 mL | Freq: Once | INTRAVENOUS | Status: AC
  Administered 2020-11-04: 50 mL via INTRAVENOUS

## 2020-11-04 MED ORDER — NICOTINE 14 MG/24HR TD PT24
14.0000 mg | MEDICATED_PATCH | Freq: Every day | TRANSDERMAL | Status: DC
  Administered 2020-11-04 – 2020-11-06 (×3): 14 mg via TRANSDERMAL
  Filled 2020-11-04 (×3): qty 1

## 2020-11-04 MED ORDER — ATORVASTATIN CALCIUM 40 MG PO TABS
40.0000 mg | ORAL_TABLET | Freq: Every day | ORAL | Status: DC
  Administered 2020-11-04 – 2020-11-05 (×2): 40 mg via ORAL
  Filled 2020-11-04 (×2): qty 1

## 2020-11-04 MED ORDER — ACETAMINOPHEN 325 MG PO TABS
650.0000 mg | ORAL_TABLET | ORAL | Status: DC | PRN
  Administered 2020-11-06: 650 mg via ORAL
  Filled 2020-11-04: qty 2

## 2020-11-04 MED ORDER — CHLORHEXIDINE GLUCONATE CLOTH 2 % EX PADS
6.0000 | MEDICATED_PAD | Freq: Every day | CUTANEOUS | Status: DC
  Administered 2020-11-04 – 2020-11-06 (×3): 6 via TOPICAL

## 2020-11-04 MED ORDER — ACETAMINOPHEN 650 MG RE SUPP: 650.0000 mg | RECTAL | Status: DC | PRN

## 2020-11-04 MED ORDER — IOHEXOL 350 MG/ML SOLN
100.0000 mL | Freq: Once | INTRAVENOUS | Status: AC | PRN
  Administered 2020-11-04: 100 mL via INTRAVENOUS

## 2020-11-04 MED ORDER — NICARDIPINE HCL IN NACL 20-0.86 MG/200ML-% IV SOLN: 0.0000 mg/h | INTRAVENOUS | Status: DC

## 2020-11-04 NOTE — H&P (Signed)
NEUROLOGY CONSULTATION NOTE   Date of service: November 04, 2020 Patient Name: Victor Pena MRN:  [id number] DOB:  [birth year] Reason for consult: "Stroke code" Requesting Provider: Gloris Manchesterixon, Ryan, MD _ _ _   _ __   _ __ _ _  __ __   _ __   __ _  History of Present Illness  Victor Pena is a 65 y.o. 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 Persistent filling of the aneurysm which appear decreased in size when compared to prior angiogram. 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 He presents with sudden onset painless R eye vision loss.  Was in the shower at 0700 when he had sudden onset painless R eye vision loss. No curtain falling, no redness in the eye. No trauma, no fall. He reports a rim of peripheral vision in the temporal aspect of his R eye. He was asked to come to the ED by his Neuro radiologist Dr. Tommie Samse Macedo Rodrigues who reached out to me and a stroke code was activated in the ED by me upon my immediate evaluation.  STAT CTH was obtained and in the scanner, he reported that some of his vision is coming back in the R temporal visual field. He still had very significant vision loss. tPA was offered.  mRS: 0 tPA: yes, offered. Discussed risks and  benefits including 15-20% chance of improvement in vision and about 3% chance of ICH. Patient agreed to tPA. Thrombectomy: No LVO, not offered. NIHSS components Score: Comment  1a Level of Conscious 0[x]  1[]  2[]  3[]      1b LOC Questions 0[]  1[]  2[]       1c LOC Commands 0[x]  1[]  2[]       2 Best Gaze 0[x]  1[]  2[]       3 Visual 0[]  1[]  2[x]  3[]    Improved in the CT scanner to a 1 prior to giving tPA.  4 Facial Palsy 0[]  1[]  2[]  3[]      5a Motor Arm - left 0[]  1[]  2[]  3[]  4[]  UN[]    5b Motor Arm - Right 0[]  1[]  2[]  3[]  4[]  UN[]    6a Motor Leg - Left 0[]  1[]  2[]  3[]  4[]  UN[]    6b Motor Leg - Right 0[]  1[]  2[]  3[]  4[]  UN[]    7 Limb Ataxia 0[]  1[]  2[]  3[]  UN[]     8 Sensory 0[]  1[]  2[]  UN[]      9 Best Language 0[]  1[]  2[]  3[]      10 Dysarthria 0[]  1[]  2[]  UN[]      11 Extinct. and Inattention 0[]  1[]  2[]       TOTAL:          ROS   Constitutional Denies weight loss, fever and chills.   HEENT Endorses changes in vision. No hearing loss.  Respiratory Denies SOB and cough.   CV  Denies palpitations and CP   GI Denies abdominal pain, nausea, vomiting and diarrhea.   GU Denies dysuria and urinary frequency.   MSK Denies myalgia and joint pain.   Skin Denies rash and pruritus.   Neurological Denies headache and syncope.   Psychiatric Denies recent changes in mood. Denies anxiety and depression.    Past History   Past Medical History:  Diagnosis Date   Cerebral aneurysm    Diplopia    Intracranial aneurysm    Seasonal allergies    Wears glasses    Past Surgical History:  Procedure Laterality Date   BACK SURGERY     COLONOSCOPY     IR 3D INDEPENDENT WKST  07/19/2019   IR 3D INDEPENDENT WKST  07/19/2019   IR 3D INDEPENDENT WKST  08/06/2019   IR 3D INDEPENDENT WKST  08/19/2019   IR 3D INDEPENDENT WKST  10/02/2019   IR ANGIO EXTERNAL CAROTID SEL EXT CAROTID BILAT MOD SED  07/19/2019   IR ANGIO INTRA EXTRACRAN SEL COM CAROTID INNOMINATE BILAT MOD SED  03/17/2020   IR ANGIO INTRA EXTRACRAN  SEL INTERNAL CAROTID BILAT MOD SED  07/19/2019   IR ANGIO INTRA EXTRACRAN SEL INTERNAL CAROTID BILAT MOD SED  10/02/2019   IR ANGIO INTRA EXTRACRAN SEL INTERNAL CAROTID BILAT MOD SED  10/20/2020   IR ANGIO VERTEBRAL SEL SUBCLAVIAN INNOMINATE UNI L MOD SED  03/17/2020   IR ANGIO VERTEBRAL SEL VERTEBRAL BILAT MOD SED  07/19/2019   IR ANGIO VERTEBRAL SEL VERTEBRAL BILAT MOD SED  10/20/2020   IR ANGIO VERTEBRAL SEL VERTEBRAL UNI R MOD SED  10/02/2019   IR ANGIO VERTEBRAL SEL VERTEBRAL UNI R MOD SED  03/17/2020   IR ANGIOGRAM FOLLOW UP STUDY  08/06/2019   IR ANGIOGRAM FOLLOW UP STUDY  08/06/2019   IR ANGIOGRAM FOLLOW UP STUDY  08/06/2019   IR ANGIOGRAM FOLLOW UP STUDY  08/06/2019   IR ANGIOGRAM FOLLOW UP STUDY  08/06/2019   IR ANGIOGRAM FOLLOW UP STUDY  08/06/2019   IR ANGIOGRAM FOLLOW UP STUDY  10/02/2019   IR CT HEAD LTD  08/06/2019   IR CT HEAD LTD  08/19/2019   IR CT HEAD LTD  10/02/2019   IR INTRA CRAN STENT  08/19/2019   IR NEURO EACH ADD'L AFTER BASIC UNI LEFT (MS)  08/06/2019   IR TRANSCATH/EMBOLIZ  08/06/2019   IR TRANSCATH/EMBOLIZ  10/02/2019       IR TRANSCATH/EMBOLIZ  10/02/2019   IR US GUIDE VASC ACCESS RIGHT  07/19/2019   IR US GUIDE VASC ACCESS RIGHT  08/06/2019   IR US GUIDE VASC ACCESS RIGHT  08/19/2019   IR US GUIDE VASC ACCESS RIGHT  10/02/2019   IR US GUIDE VASC ACCESS RIGHT  03/17/2020   IR US GUIDE VASC ACCESS RIGHT  10/20/2020   RADIOLOGY WITH ANESTHESIA N/A 08/06/2019   Procedure: IR WITH ANESTHESIA;  Surgeon: Radiologist, Medication, MD;  Location: [facility] OR;  Service: Radiology;  Laterality: N/A;   RADIOLOGY WITH ANESTHESIA N/A 08/19/2019   Procedure: STENT PLACEMENT;  Surgeon: Baldemar Lenis, MD;  Location: [facility] OR;  Service: Radiology;  Laterality: N/A;   RADIOLOGY WITH ANESTHESIA N/A 10/02/2019   Procedure: RADIOLOGY WITH ANESTHESIA  EMBOLIZATION;  Surgeon: Baldemar Lenis, MD;  Location: [facility] OR;  Service: Radiology;  Laterality: N/A;   WISDOM TOOTH EXTRACTION     Family  History  Problem Relation Age of Onset   Aneurysm Father    Social History   Socioeconomic History  Marital status: Married    Spouse name: Not on file   Number of children: 4   Years of education: 84   Highest education level: Not on file  Occupational History   Occupation: owner   Tobacco Use   Smoking status: Every Day    Packs/day: 0.25    Types: Cigarettes   Smokeless tobacco: Never  Vaping Use   Vaping Use: Never used  Substance and Sexual Activity   Alcohol use: Yes    Alcohol/week: 10.0 standard drinks    Types: 10 Glasses of wine per week    Comment: one to two glasses or wine per night   Drug use: Not Currently   Sexual activity: Not on file  Other Topics Concern   Not on file  Social History Narrative   Right handed   3 story home   Drinks caffeine   Social Determinants of Health   Financial Resource Strain: Not on file  Food Insecurity: Not on file  Transportation Needs: Not on file  Physical Activity: Not on file  Stress: Not on file  Social Connections: Not on file   Allergies  Allergen Reactions   Other Other (See Comments)    seasonal    Medications  (Not in a hospital admission)    Vitals   Vitals:   11/04/20 0843 11/04/20 0915  BP:  137/82  Pulse: 66 68  Resp: (!) 22 19  Temp: 97.7 F (36.5 C)   TempSrc: Oral   SpO2: 96%   Weight: 84.8 kg      Body mass index is 26.07 kg/m.  Physical Exam   General: Laying comfortably in bed; in no acute distress.  HENT: Normal oropharynx and mucosa. Normal external appearance of ears and nose.  Neck: Supple, no pain or tenderness  CV: No JVD. No peripheral edema.  Pulmonary: Symmetric Chest rise. Normal respiratory effort.  Abdomen: Soft to touch, non-tender.  Ext: No cyanosis, edema, or deformity  Skin: No rash. Normal palpation of skin.   Musculoskeletal: Normal digits and nails by inspection. No clubbing.   Neurologic Examination  Mental status/Cognition: Alert, oriented to  self, place, month and year, good attention.  Speech/language: Fluent, comprehension intact, object naming intact, repetition intact.  Cranial nerves:   CN II Pupils equal and reactive to light, R eye vision loss, some vision intact in the R temporal visual field.   CN III,IV,VI EOM intact, no gaze preference or deviation, no nystagmus    CN V normal sensation in V1, V2, and V3 segments bilaterally    CN VII no asymmetry, no nasolabial fold flattening    CN VIII normal hearing to speech    CN IX & X normal palatal elevation, no uvular deviation    CN XI 5/5 head turn and 5/5 shoulder shrug bilaterally    CN XII midline tongue protrusion    Motor:  Muscle bulk: normal, tone normal, pronator drift none tremor none Mvmt Root Nerve  Muscle Right Left Comments  SA C5/6 Ax Deltoid 5 5   EF C5/6 Mc Biceps 5 5   EE C6/7/8 Rad Triceps 5 5   WF C6/7 Med FCR     WE C7/8 PIN ECU     F Ab C8/T1 U ADM/FDI 5 5   HF L1/2/3 Fem Illopsoas 5 5   KE L2/3/4 Fem Quad 5 5   DF L4/5 D Peron Tib Ant 5 5   PF S1/2 Tibial Grc/Sol 5 5    Reflexes:  Right Left Comments  Pectoralis      Biceps (C5/6) 2 2   Brachioradialis (C5/6) 2 2    Triceps (C6/7) 2 2    Patellar (L3/4) 2 2    Achilles (S1)      Hoffman      Plantar     Jaw jerk    Sensation:  Light touch intact   Pin prick    Temperature    Vibration   Proprioception    Coordination/Complex Motor:  - Finger to Nose intact - Heel to shin intact - Rapid alternating movement are normal - Gait: Deferred.  Labs   CBC: No results for input(s): WBC, NEUTROABS, HGB, HCT, MCV, PLT in the last 168 hours.  Basic Metabolic Panel:  Lab Results  Component Value Date   NA 138 10/20/2020   K 3.9 10/20/2020   CO2 25 10/20/2020   GLUCOSE 99 10/20/2020   BUN 8 10/20/2020   CREATININE 0.89 10/20/2020   CALCIUM 9.9 10/20/2020   GFRNONAA >60 10/20/2020   GFRAA >60 10/03/2019   Lipid Panel:  Lab Results  Component Value Date   LDLCALC 149 (H)  08/06/2019   HgbA1c:  Lab Results  Component Value Date   HGBA1C 5.2 08/07/2019   Urine Drug Screen: No results found for: LABOPIA, COCAINSCRNUR, LABBENZ, AMPHETMU, THCU, LABBARB  Alcohol Level No results found for: ETH  CT Head without contrast: Personally reviewed and CTH was negative for a large hypodensity concerning for a large territory infarct or hyperdensity concerning for an ICH. ASPECTS of 10.   CT angio Head and Neck with contrast: Personally reviewed and no LVO on my read. Radiology read is pending.  MRI Brain: Pending.  Impression   Victor Pena is a 65 y.o. male with PMH significant as above who presents with sudden onset painless R eye vision loss. He does have a prior history of aneurysmal SAH with the L MCA aneurysm secured last year. tPA was given and he will be admitted for diagnosis of potential  R eye CRAO.  Impression: R eye Central retinal artery occlusion. Recommendations  Plan: - Frequent NeuroChecks for post tPA care per stroke unit protocol: - Initial CTH demonstrated no acute hemorrhage or mass - MRI Brain - pending. - CTA - no LVO - TTE - pending. - Lipid Panel: LDL - pending.  - Statin: if LDL > 70. - HbA1c: pending. - Antithrombotic: Resume ASA 81 mg daily if 24 h CTH does not show acute hemorrhage. - Discussed with Neuro IR and recommended resuming Brilinta when safe after tPA. - DVT prophylaxis: SCDs. Pharmacologic prophylaxis if 24 h CTH does not demonstrate acute hemorrhage - Smoking cessation: counseled on the importance of quitting smoking. - Systolic Blood Pressure goal: < 180 mm Hg - Telemetry monitoring for arrhythmia: 72 hours - Swallow screen - ordered - PT/OT/SLP consults  ______________________________________________________________________   This patient is critically ill and at significant risk of neurological worsening, death and care requires constant monitoring of vital signs, hemodynamics,respiratory and cardiac  monitoring, neurological assessment, discussion with family, other specialists and medical decision making of high complexity. I spent 90 minutes of neurocritical care time  in the care of  this patient. This was time spent independent of any time provided by nurse practitioner or PA.  Erick Blinks [facility] Pager Number [phone] 11/04/2020  10:02 AM   Signed,  Erick Blinks [facility] Pager Number [phone] _ _ _   _ __   _ __ _  _  __ __   _ __   __ _  

## 2020-11-04 NOTE — Code Documentation (Signed)
Stroke Response Nurse Documentation Code Documentation  Victor Pena is a 65 y.o. male arriving to [facility] ED via [facility] on 11/04/2020 with past medical hx of cerebral aneurysm. Code stroke was activated by Dr. Derry Lory Patient from home where he was LKW at 0700 while taking a showed and now complaining of sudden vision loss of the right eye.  Stroke team at the bedside on patient arrival. Labs drawn and patient cleared for CT by EDP. Patient to CT with team. NIHSS 2, see documentation for details and code stroke times. Patient with right hemianopia on exam. The following imaging was completed: CT, CTA head and neck. Patient is a candidate for tPA and was started in CT.   Care/Plan: Post-tPA assessments per protocol: q15 x 2 hours. Full NIHSS to be completed at 1105. Re-evaluate patient to see if eligible for low intensity monitoring.   Bedside handoff with ED RN VIctoria.    Lucila Maine  Stroke Response RN

## 2020-11-04 NOTE — ED Notes (Signed)
Help get patient undressed on the monitor did ekg shown to er provider patient is resting with call bell in reach 

## 2020-11-04 NOTE — Consult Note (Signed)
Ophthalmology Initial Consult Note  Victor Pena, Victor Pena, 65 y.o. male Date of Service:  11/04/20  Requesting physician: Donnetta Simpers, MD  Information Obtained from: Patient Chief Complaint:  Sudden painless loss of vision, right eye  HPI/Discussion:  Victor Pena is a 65 y.o. male who reports sudden loss of vision in the right eye this morning. [name] unable to perceive light. Denies flashes or floaters. No pain. The left eye sees normally.  Past Ocular Hx:  Glasses; Possible motility issue with left eye Ocular Meds:  None Family ocular history: None relevant  Past Medical History:  Diagnosis Date   Cerebral aneurysm    Diplopia    Intracranial aneurysm    Seasonal allergies    Wears glasses    Past Surgical History:  Procedure Laterality Date   BACK SURGERY     COLONOSCOPY     IR 3D INDEPENDENT WKST  07/19/2019   IR 3D INDEPENDENT WKST  07/19/2019   IR 3D INDEPENDENT WKST  08/06/2019   IR 3D INDEPENDENT WKST  08/19/2019   IR 3D INDEPENDENT WKST  10/02/2019   IR ANGIO EXTERNAL CAROTID SEL EXT CAROTID BILAT MOD SED  07/19/2019   IR ANGIO INTRA EXTRACRAN SEL COM CAROTID INNOMINATE BILAT MOD SED  03/17/2020   IR ANGIO INTRA EXTRACRAN SEL INTERNAL CAROTID BILAT MOD SED  07/19/2019   IR ANGIO INTRA EXTRACRAN SEL INTERNAL CAROTID BILAT MOD SED  10/02/2019   IR ANGIO INTRA EXTRACRAN SEL INTERNAL CAROTID BILAT MOD SED  10/20/2020   IR ANGIO VERTEBRAL SEL SUBCLAVIAN INNOMINATE UNI L MOD SED  03/17/2020   IR ANGIO VERTEBRAL SEL VERTEBRAL BILAT MOD SED  07/19/2019   IR ANGIO VERTEBRAL SEL VERTEBRAL BILAT MOD SED  10/20/2020   IR ANGIO VERTEBRAL SEL VERTEBRAL UNI R MOD SED  10/02/2019   IR ANGIO VERTEBRAL SEL VERTEBRAL UNI R MOD SED  03/17/2020   IR ANGIOGRAM FOLLOW UP STUDY  08/06/2019   IR ANGIOGRAM FOLLOW UP STUDY  08/06/2019   IR ANGIOGRAM FOLLOW UP STUDY  08/06/2019   IR ANGIOGRAM FOLLOW UP STUDY  08/06/2019   IR ANGIOGRAM FOLLOW UP STUDY  08/06/2019   IR ANGIOGRAM FOLLOW UP STUDY  08/06/2019   IR  ANGIOGRAM FOLLOW UP STUDY  10/02/2019   IR CT HEAD LTD  08/06/2019   IR CT HEAD LTD  08/19/2019   IR CT HEAD LTD  10/02/2019   IR INTRA CRAN STENT  08/19/2019   IR NEURO EACH ADD'L AFTER BASIC UNI LEFT (MS)  08/06/2019   IR TRANSCATH/EMBOLIZ  08/06/2019   IR TRANSCATH/EMBOLIZ  10/02/2019       IR TRANSCATH/EMBOLIZ  10/02/2019   IR US GUIDE VASC ACCESS RIGHT  07/19/2019   IR US GUIDE VASC ACCESS RIGHT  08/06/2019   IR US GUIDE VASC ACCESS RIGHT  08/19/2019   IR US GUIDE VASC ACCESS RIGHT  10/02/2019   IR US GUIDE VASC ACCESS RIGHT  03/17/2020   IR US GUIDE VASC ACCESS RIGHT  10/20/2020   RADIOLOGY WITH ANESTHESIA N/A 08/06/2019   Procedure: IR WITH ANESTHESIA;  Surgeon: Radiologist, Medication, MD;  Location: [facility] OR;  Service: Radiology;  Laterality: N/A;   RADIOLOGY WITH ANESTHESIA N/A 08/19/2019   Procedure: STENT PLACEMENT;  Surgeon: Pedro Earls, MD;  Location: [place];  Service: Radiology;  Laterality: N/A;   RADIOLOGY WITH ANESTHESIA N/A 10/02/2019   Procedure: RADIOLOGY WITH ANESTHESIA  EMBOLIZATION;  Surgeon: Pedro Earls, MD;  Location: [place];  Service:  Radiology;  Laterality: N/A;   WISDOM TOOTH EXTRACTION      Prior to Admission Meds: Medications Prior to Admission  Medication Sig Dispense Refill Last Dose   acetaminophen (TYLENOL) 500 MG tablet Take 1,000 mg by mouth every 6 (six) hours as needed (Back pain).   11/04/2020   aspirin 81 MG EC tablet Take 81 mg by mouth daily. Swallow whole.   11/03/2020   atorvastatin (LIPITOR) 40 MG tablet TAKE 1 TABLET BY MOUTH EVERY DAY (Patient taking differently: Take 40 mg by mouth daily.) 90 tablet 1 11/03/2020   calcipotriene-betamethasone (TACLONEX) external suspension Apply topically daily. (Patient not taking: Reported on 10/14/2020) 120 g 2    nicotine (NICODERM CQ - DOSED IN MG/24 HOURS) 14 mg/24hr patch Place 1 patch (14 mg total) onto the skin daily. (Patient not taking: Reported on 10/14/2020) 28 patch 0     Inpatient  Meds: @IPMEDS @  Allergies  Allergen Reactions   Other Other (See Comments)    seasonal   Social History   Tobacco Use   Smoking status: Every Day    Packs/day: 0.25    Types: Cigarettes   Smokeless tobacco: Never  Substance Use Topics   Alcohol use: Yes    Alcohol/week: 10.0 standard drinks    Types: 10 Glasses of wine per week    Comment: one to two glasses or wine per night   Family History  Problem Relation Age of Onset   Aneurysm Father     ROS: Other than ROS in the HPI, all other systems were negative.  Exam: Temp: 98.2 F (36.8 C) Pulse Rate: 61 BP: 131/82 Resp: 13 SpO2: 97 %  Visual Acuity:  Victor Pena  cc  ph  near   OD  +     NLP   OS  +     20/40 cc      OD OS  Confr Vis Fields Totally depressed Full  EOM (Primary) Full w mild XT Full  Lids/Lashes WNL WNL  Conjunctiva - Bulbar White White  Conjunctiva - Palpebral               WNL WNL  Adnexa  WNL WNL  Pupils  Round Round  Reaction, Direct None Brisk                 Consensual Brisk None                 RAPD Yes - 4+ No  Cornea  Clear Clear  Anterior Chamber Deep Deep  Lens:  NSC NSC  IOP 12 11  Fundus - Dilated? Yes Yes  Optic Disc - C:D Ratio 0.3 0.3                     Appearance  Normal Normal                     NF Layer    Post Seg:  Retina                    Vessels Arterial ghost vessels; Venous attenuation with darkened appearance and some box-carring; No intravascular plaques seen Normal                  Vitreous  Clear Clear                  Macula Cherry red fovea Normal  Periphery Attached Attached       Neuro:  Oriented to person, place, and time:  Yes Psychiatric:  Mood and Affect Appropriate:  Yes  A/P:  65 y.o. 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 - Suspect occlusion of artery proximal to globe; No plaques seen in retinal vessels - It seems unlikely given his age and history but would still consider checking inflammatory markers (ESR and CRP) to rule out the  possibility of temporal arteritis - Unfortunately the loss of vision is likely permanent - He tried ocular massage this morning and was also given TPA - Management at this point should be focused on preventing further embolic events - [name] at risk for neovascularization in the right eye and should be followed regularly by an ophthalmologist  Please have him follow-up with [facility] 6 weeks after discharge  Midge Aver, MD 11/04/2020, 12:39 PM

## 2020-11-04 NOTE — ED Notes (Signed)
Pt transported to 4N by RN

## 2020-11-04 NOTE — ED Provider Notes (Signed)
[facility] EMERGENCY DEPARTMENT Provider Note   CSN: [id number] Arrival date & time: 11/04/20  9562  An emergency department physician performed an initial assessment on this suspected stroke patient at 26.  History Chief Complaint  Patient presents with   Loss of Vision   Code Stroke    Victor Pena is a 65 y.o. male.  HPI Patient is a 65 year old male with history of brain aneurysms.  He has undergone procedures in the past.  He states that he has stents placed in his cerebral vessels.  His last procedure was over 1 year ago.  His last follow-up appointment with neurosurgery was 2 weeks ago.  He woke up feeling normal.  1.5 hours prior to arrival, he had a painless loss of vision in his right eye.  He describes a loss of vision as progressing from blurriness to blackness over approximately 15 minutes.  He has not had any visual changes in his left eye.  He has not had any other neurologic symptoms.  Patient has no history of stroke or CAD.  He does take daily aspirin and statin.  He was previously on Brilinta which was discontinued 2 weeks ago.     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   Brain aneurysm    Difficulty with speech    Subarachnoid hemorrhage (HCC) 08/06/2019   Bilateral impacted cerumen 10/09/2015   Conductive hearing loss in left ear 10/09/2015   Smokes 1 pack of cigarettes per day 10/09/2015    Past Surgical History:  Procedure Laterality Date   BACK SURGERY     COLONOSCOPY     IR 3D INDEPENDENT WKST  07/19/2019   IR 3D INDEPENDENT WKST  07/19/2019   IR 3D INDEPENDENT WKST  08/06/2019   IR 3D INDEPENDENT WKST  08/19/2019   IR 3D INDEPENDENT WKST  10/02/2019   IR ANGIO EXTERNAL CAROTID SEL EXT CAROTID BILAT MOD SED  07/19/2019    IR ANGIO INTRA EXTRACRAN SEL COM CAROTID INNOMINATE BILAT MOD SED  03/17/2020   IR ANGIO INTRA EXTRACRAN SEL INTERNAL CAROTID BILAT MOD SED  07/19/2019   IR ANGIO INTRA EXTRACRAN SEL INTERNAL CAROTID BILAT MOD SED  10/02/2019   IR ANGIO INTRA EXTRACRAN SEL INTERNAL CAROTID BILAT MOD SED  10/20/2020   IR ANGIO VERTEBRAL SEL SUBCLAVIAN INNOMINATE UNI L MOD SED  03/17/2020   IR ANGIO VERTEBRAL SEL VERTEBRAL BILAT MOD SED  07/19/2019   IR ANGIO VERTEBRAL SEL VERTEBRAL BILAT MOD SED  10/20/2020   IR ANGIO VERTEBRAL SEL VERTEBRAL UNI R MOD SED  10/02/2019   IR ANGIO VERTEBRAL SEL VERTEBRAL UNI R MOD SED  03/17/2020   IR ANGIOGRAM FOLLOW UP STUDY  08/06/2019   IR ANGIOGRAM FOLLOW UP STUDY  08/06/2019   IR ANGIOGRAM FOLLOW UP STUDY  08/06/2019   IR ANGIOGRAM FOLLOW UP STUDY  08/06/2019   IR ANGIOGRAM FOLLOW UP STUDY  08/06/2019   IR ANGIOGRAM FOLLOW UP STUDY  08/06/2019   IR ANGIOGRAM FOLLOW UP STUDY  10/02/2019   IR CT HEAD LTD  08/06/2019   IR CT HEAD LTD  08/19/2019   IR CT HEAD LTD  10/02/2019   IR INTRA CRAN STENT  08/19/2019   IR NEURO EACH ADD'L  AFTER BASIC UNI LEFT (MS)  08/06/2019   IR TRANSCATH/EMBOLIZ  08/06/2019   IR TRANSCATH/EMBOLIZ  10/02/2019       IR TRANSCATH/EMBOLIZ  10/02/2019   IR US GUIDE VASC ACCESS RIGHT  07/19/2019   IR US GUIDE VASC ACCESS RIGHT  08/06/2019   IR US GUIDE VASC ACCESS RIGHT  08/19/2019   IR US GUIDE VASC ACCESS RIGHT  10/02/2019   IR US GUIDE VASC ACCESS RIGHT  03/17/2020   IR US GUIDE VASC ACCESS RIGHT  10/20/2020   RADIOLOGY WITH ANESTHESIA N/A 08/06/2019   Procedure: IR WITH ANESTHESIA;  Surgeon: Radiologist, Medication, MD;  Location: [facility] OR;  Service: Radiology;  Laterality: N/A;   RADIOLOGY WITH ANESTHESIA N/A 08/19/2019   Procedure: STENT PLACEMENT;  Surgeon: Baldemar Lenis, MD;  Location: [facility] OR;  Service: Radiology;  Laterality: N/A;   RADIOLOGY WITH ANESTHESIA N/A 10/02/2019   Procedure: RADIOLOGY WITH ANESTHESIA  EMBOLIZATION;  Surgeon: Baldemar Lenis, MD;  Location: [facility] OR;  Service: Radiology;  Laterality: N/A;   WISDOM TOOTH EXTRACTION         Family History  Problem Relation Age of Onset   Aneurysm Father     Social History   Tobacco Use   Smoking status: Every Day    Packs/day: 0.25    Types: Cigarettes   Smokeless tobacco: Never  Vaping Use   Vaping Use: Never used  Substance Use Topics   Alcohol use: Yes    Alcohol/week: 10.0 standard drinks    Types: 10 Glasses of wine per week    Comment: one to two glasses or wine per night   Drug use: Not Currently    Home Medications Prior to Admission medications   Medication Sig Start Date End Date Taking? Authorizing Provider  acetaminophen (TYLENOL) 500 MG tablet Take 1,000 mg by mouth every 6 (six) hours as needed (Back pain).   Yes [provider]  aspirin 81 MG EC tablet Take 81 mg by mouth daily. Swallow whole.   Yes [provider]  atorvastatin (LIPITOR) 40 MG tablet TAKE 1 TABLET BY MOUTH EVERY DAY Patient taking differently: Take 40 mg by mouth daily. 04/06/20  Yes Micki Riley, MD  calcipotriene-betamethasone [facility]) external suspension Apply topically daily. Patient not taking: Reported on 10/14/2020 09/05/19   Clark-Burning, Victorino Dike, PA-C  nicotine (NICODERM CQ - DOSED IN MG/24 HOURS) 14 mg/24hr patch Place 1 patch (14 mg total) onto the skin daily. Patient not taking: Reported on 10/14/2020 08/21/19   Baird Lyons, PA-C    Allergies    Other  Review of Systems   Review of Systems  Constitutional:  Negative for activity change, appetite change, chills, fatigue and fever.  HENT:  Negative for congestion, ear pain, sore throat, trouble swallowing and voice change.   Eyes:  Positive for visual disturbance. Negative for photophobia, pain, discharge and redness.  Respiratory:  Negative for cough and shortness of breath.   Cardiovascular:  Negative for chest pain and palpitations.  Gastrointestinal:  Negative for abdominal pain,  nausea and vomiting.  Genitourinary:  Negative for dysuria and hematuria.  Musculoskeletal:  Negative for arthralgias, back pain, gait problem, neck pain and neck stiffness.  Skin:  Negative for color change and rash.  Neurological:  Negative for dizziness, seizures, syncope, speech difficulty, weakness, light-headedness and numbness.  Psychiatric/Behavioral:  Negative for confusion and decreased concentration.   All other systems reviewed and are negative.  Physical Exam Updated Vital Signs BP 138/80  Pulse 73   Temp 98.3 F (36.8 C) (Oral)   Resp (!) 22   Wt 83 kg   SpO2 93%   BMI 25.52 kg/m   Physical Exam Vitals and nursing note reviewed.  Constitutional:      General: [name] not in acute distress.    Appearance: Normal appearance. [name] well-developed. [name] not ill-appearing, toxic-appearing or diaphoretic.  HENT:     Head: Normocephalic and atraumatic.     Right Ear: External ear normal.     Left Ear: External ear normal.     Nose: Nose normal.     Mouth/Throat:     Mouth: Mucous membranes are moist.     Pharynx: Oropharynx is clear.  Eyes:     Extraocular Movements: Extraocular movements intact.     Conjunctiva/sclera: Conjunctivae normal.     Comments: Total loss of vision in right eye.  Cardiovascular:     Rate and Rhythm: Normal rate and regular rhythm.     Heart sounds: No murmur heard. Pulmonary:     Effort: Pulmonary effort is normal. No respiratory distress.     Breath sounds: Normal breath sounds.  Abdominal:     Palpations: Abdomen is soft.     Tenderness: There is no abdominal tenderness. There is no guarding.  Musculoskeletal:        General: Normal range of motion.     Cervical back: Neck supple. No rigidity or tenderness.  Skin:    General: Skin is warm and dry.     Coloration: Skin is not jaundiced or pale.  Neurological:     Mental Status: [name] alert and oriented to person, place, and time.     Cranial Nerves: No dysarthria or facial  asymmetry.     Sensory: Sensation is intact. No sensory deficit.     Motor: Motor function is intact. No weakness, abnormal muscle tone or pronator drift.     Coordination: Coordination is intact. Finger-Nose-Finger Test normal.  Psychiatric:        Mood and Affect: Mood normal.        Behavior: Behavior normal.        Thought Content: Thought content normal.        Judgment: Judgment normal.    ED Results / Procedures / Treatments   Labs (all labs ordered are listed, but only abnormal results are displayed) Labs Reviewed  URINALYSIS, ROUTINE W REFLEX MICROSCOPIC - Abnormal; Notable for the following components:      Result Value   Color, Urine STRAW (*)    Hgb urine dipstick SMALL (*)    All other components within normal limits  CBC WITH DIFFERENTIAL/PLATELET - Abnormal; Notable for the following components:   RBC 4.04 (*)    MCV 101.0 (*)    MCH 34.4 (*)    All other components within normal limits  RESP PANEL BY RT-PCR (FLU A&B, COVID) ARPGX2  MRSA NEXT GEN BY PCR, NASAL  PROTIME-INR  APTT  COMPREHENSIVE METABOLIC PANEL  SEDIMENTATION RATE  C-REACTIVE PROTEIN  FIBRINOGEN  HIV ANTIBODY (ROUTINE TESTING W REFLEX)  LIPID PANEL  HEMOGLOBIN A1C  CBC  DIFFERENTIAL  CBG MONITORING, ED  TYPE AND SCREEN  PREPARE CRYOPRECIPITATE    EKG EKG Interpretation  Date/Time:  Wednesday November 04 2020 08:37:39 EDT Ventricular Rate:  75 PR Interval:  165 QRS Duration: 101 QT Interval:  410 QTC Calculation: 458 R Axis:   29 Text Interpretation: Sinus rhythm Atrial premature complex Confirmed by Durwin Nora,  Deno Sida [phone]) on 11/04/2020 9:23:05 AM  Radiology CT HEAD WO CONTRAST ( )  Result Date: 11/04/2020 CLINICAL DATA:  Follow-up intracranial hemorrhage. EXAM: CT HEAD WITHOUT CONTRAST TECHNIQUE: Contiguous axial images were obtained from the base of the skull through the vertex without intravenous contrast. COMPARISON:  Earlier head CT same date. FINDINGS: Brain: Again demonstrated are  areas of subarachnoid hemorrhage in both occipital lobes. There is no evidence of some progressive small rounded parenchymal hematomas. No significant mass effect. No other sites of hemorrhage are identified. I do not see any obvious occipital infarctions. Vascular: Stable left-sided MCA stent. Skull: No skull fracture or bone lesions. Sinuses/Orbits: The paranasal sinuses and mastoid air cells are clear. Other: No scalp lesions or scalp hematoma. IMPRESSION: 1. Stable areas of subarachnoid hemorrhage in both occipital lobes. New or more evident small rounded parenchymal hematomas. 2. Stable left-sided MCA stent. Electronically Signed   By: Rudie Meyer M.D.   On: 11/04/2020 16:41   CT HEAD WO CONTRAST ( )  Result Date: 11/04/2020 CLINICAL DATA:  New or worsening headache. Previous administration of tPA. EXAM: CT HEAD WITHOUT CONTRAST TECHNIQUE: Contiguous axial images were obtained from the base of the skull through the vertex without intravenous contrast. COMPARISON:  CT studies earlier same day. FINDINGS: Brain: Brain parenchyma continues to have a normal appearance. However, there is some dependent subarachnoid hemorrhage within the sulci of both occipital regions. Source of this bleeding is not apparent. No evidence of an intraparenchymal component. The distribution is not typical of aneurysmal hemorrhage. No hydrocephalus. No extra-axial collection. Stent assisted coiling of a left MCA region aneurysm as seen previously. Vascular: No other vascular finding. Skull: Negative Sinuses/Orbits: Clear/normal Other: None IMPRESSION: Some subarachnoid hemorrhage now evident in the sulci of both occipital regions. No evidence of intraparenchymal hemorrhage. See above for full discussion. Case discussed with Dr. Derry Lory at 1030 hours. Electronically Signed   By: Paulina Fusi M.D.   On: 11/04/2020 10:33   CT VENOGRAM HEAD  Result Date: 11/04/2020 CLINICAL DATA:  Rule out venous sinus thrombosis. Headache with  intracranial hemorrhage. Previous administration of tPA EXAM: CT VENOGRAM HEAD TECHNIQUE: Venographic phase images of the brain were obtained following the administration of intravenous contrast. Multiplanar reformats and maximum intensity projections were generated. CONTRAST:  OMNIPAQUE IOHEXOL 350 MG/ML SOLN COMPARISON:  Multiple CT head examinations 11/04/2020 FINDINGS: Superior sagittal sinus enhances normally and is widely patent. Internal cerebral veins and straight sinus widely patent. The transverse sinus and sigmoid sinus is patent bilaterally. There are small lead filling defects in the lateral transverse sinus bilaterally which are symmetric and likely due to arachnoid granulation. If this were thrombus it would be small and not likely to be the cause of the patient's symptoms. Ventricle size remains normal. Occipital subarachnoid hemorrhage again noted. Rounded parenchymal hemorrhage in the occipital lobes bilaterally right greater than left similar to the recent CT from today. No new area of hemorrhage. Aneurysm coiling and stenting left MCA. Flow diverting stent right cavernous carotid. IMPRESSION: Small filling defects in the lateral transverse sinus bilaterally, likely arachnoid granulation rather than small areas of thrombus. Otherwise normal enhancement of the dural venous sinuses. Subarachnoid and parenchymal hemorrhage in the occipital lobe bilaterally similar to the CT earlier today. Electronically Signed   By: Marlan Palau M.D.   On: 11/04/2020 17:44   CT HEAD CODE STROKE WO CONTRAST  Result Date: 11/04/2020 CLINICAL DATA:  Code stroke.  Neuro deficit, acute, stroke suspected EXAM: CT HEAD WITHOUT CONTRAST TECHNIQUE: Contiguous  axial images were obtained from the base of the skull through the vertex without intravenous contrast. COMPARISON:  08/16/2019 FINDINGS: Brain: There is no acute intracranial hemorrhage, mass effect, or edema. Gray-white differentiation remains preserved.  Ventricles and sulci are within normal limits in size and configuration. No extra-axial fluid collection. Vascular: Stents are present along the supraclinoid right ICA and left M1-M2 MCA. Post coiling of left MCA bifurcation aneurysm. Associated streak artifact. Skull: Unremarkable. Sinuses/Orbits: No acute abnormality. Other: Mastoid air cells are clear. ASPECTS (Alberta Stroke Program Early CT Score) - Ganglionic level infarction (caudate, lentiform nuclei, internal capsule, insula, M1-M3 cortex): 7 - Supraganglionic infarction (M4-M6 cortex): 3 Total score (0-10 with 10 being normal): 10 IMPRESSION: There is no acute intracranial hemorrhage or evidence of acute infarction. ASPECT score is 10. These results were provided by telephone at the time of interpretation on 11/04/2020 at 9:05 am to provider Dr. Derry LoryKhaliqdina, who verbally acknowledged these results. Electronically Signed   By: Guadlupe SpanishPraneil  Patel M.D.   On: 11/04/2020 09:12   CT ANGIO HEAD CODE STROKE  Result Date: 11/04/2020 CLINICAL DATA:  Stroke/TIA, assess intracranial and extracranial arteries EXAM: CT ANGIOGRAPHY HEAD AND NECK TECHNIQUE: Multidetector CT imaging of the head and neck was performed using the standard protocol during bolus administration of intravenous contrast. Multiplanar CT image reconstructions and MIPs were obtained to evaluate the vascular anatomy. Carotid stenosis measurements (when applicable) are obtained utilizing NASCET criteria, using the distal internal carotid diameter as the denominator. CONTRAST:  60mL OMNIPAQUE IOHEXOL 350 MG/ML SOLN COMPARISON:  08/16/2019 CTA head; correlation made with recent catheter angiogram FINDINGS: CTA NECK Aortic arch: Mild mixed plaque along the arch and patent great vessel origins. Right carotid system: Patent. Mild calcified plaque at the ICA origin without stenosis. Left carotid system: Patent. Minor plaque at the ICA origin without stenosis. Vertebral arteries: Patent and codominant.  No  stenosis. Skeleton: No acute osseous abnormality. Other neck: Unremarkable. Upper chest: Included upper lungs are clear. Review of the MIP images confirms the above findings CTA HEAD Anterior circulation: Intracranial internal carotid arteries are patent. Minor calcified plaque is present. There is also mild focal noncalcified plaque along the floor of the cavernous left ICA. A patent stent spans the distal cavernous right ICA to the distal supraclinoid portion. Residual filling of the paraophthalmic aneurysm by catheter angiogram is not evident. Right middle cerebral and both anterior cerebral arteries are patent. Stent markers are seen at the left M1 MCA origin and proximal M2 MCA posterior division. There is streak artifact from coiled MCA bifurcation aneurysm. Stent appears patent within this limitation. No apparent recurrent aneurysm. Posterior circulation: Intracranial vertebral arteries patent. Basilar artery is patent. Major cerebellar artery origins are patent. Right posterior communicating artery is present. Posterior cerebral arteries are patent. Venous sinuses: Patent as allowed by contrast bolus timing. Review of the MIP images confirms the above findings IMPRESSION: No large vessel occlusion. Intracranial right ICA flow diverting stent is patent. 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 Within this limitation, patent left M1-M2 MCA stent and no apparent recurrent aneurysm. Electronically Signed   By: Guadlupe SpanishPraneil  Patel M.D.   On: 11/04/2020 09:33   CT ANGIO NECK CODE STROKE  Result Date: 11/04/2020 CLINICAL DATA:  Stroke/TIA, assess intracranial and extracranial arteries EXAM: CT ANGIOGRAPHY HEAD AND NECK TECHNIQUE: Multidetector CT imaging of the head and neck was performed using the standard protocol during bolus administration of intravenous  contrast. Multiplanar CT image reconstructions  and MIPs were obtained to evaluate the vascular anatomy. Carotid stenosis measurements (when applicable) are obtained utilizing NASCET criteria, using the distal internal carotid diameter as the denominator. CONTRAST:  60mL OMNIPAQUE IOHEXOL 350 MG/ML SOLN COMPARISON:  08/16/2019 CTA head; correlation made with recent catheter angiogram FINDINGS: CTA NECK Aortic arch: Mild mixed plaque along the arch and patent great vessel origins. Right carotid system: Patent. Mild calcified plaque at the ICA origin without stenosis. Left carotid system: Patent. Minor plaque at the ICA origin without stenosis. Vertebral arteries: Patent and codominant.  No stenosis. Skeleton: No acute osseous abnormality. Other neck: Unremarkable. Upper chest: Included upper lungs are clear. Review of the MIP images confirms the above findings CTA HEAD Anterior circulation: Intracranial internal carotid arteries are patent. Minor calcified plaque is present. There is also mild focal noncalcified plaque along the floor of the cavernous left ICA. A patent stent spans the distal cavernous right ICA to the distal supraclinoid portion. Residual filling of the paraophthalmic aneurysm by catheter angiogram is not evident. Right middle cerebral and both anterior cerebral arteries are patent. Stent markers are seen at the left M1 MCA origin and proximal M2 MCA posterior division. There is streak artifact from coiled MCA bifurcation aneurysm. Stent appears patent within this limitation. No apparent recurrent aneurysm. Posterior circulation: Intracranial vertebral arteries patent. Basilar artery is patent. Major cerebellar artery origins are patent. Right posterior communicating artery is present. Posterior cerebral arteries are patent. Venous sinuses: Patent as allowed by contrast bolus timing. Review of the MIP images confirms the above findings IMPRESSION: No large vessel occlusion. Intracranial right ICA flow diverting stent is patent. Residual right  paraophthalmic aneurysm on recent catheter angiogram is not evident by CTA. 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 Within this limitation, patent left M1-M2 MCA stent and no apparent recurrent aneurysm. Electronically Signed   By: Guadlupe Spanish M.D.   On: 11/04/2020 09:33    Procedures Procedures   Medications Ordered in ED Medications  atorvastatin (LIPITOR) tablet 40 mg (40 mg Oral Given 11/04/20 1444)  nicotine (NICODERM CQ - dosed in mg/24 hours) patch 14 mg (14 mg Transdermal Patch Applied 11/04/20 1444)  0.9 %  sodium chloride infusion (has no administration in time range)  acetaminophen (TYLENOL) tablet 650 mg (has no administration in time range)    Or  acetaminophen (TYLENOL) 160 MG/5ML solution 650 mg (has no administration in time range)    Or  acetaminophen (TYLENOL) suppository 650 mg (has no administration in time range)  senna-docusate (Senokot-S) tablet 1 tablet (1 tablet Oral Given 11/04/20 1444)  pantoprazole (PROTONIX) injection 40 mg (has no administration in time range)  labetalol (NORMODYNE) injection 20 mg (0 mg Intravenous Hold 11/04/20 1628)    And  nicardipine (CARDENE)  in 0.86% saline IV infusion (0.1 mg/ml) (0 mg/hr Intravenous Hold 11/04/20 1629)  Chlorhexidine Gluconate Cloth 2 % PADS 6 each (6 each Topical Given 11/04/20 1147)  pneumococcal 23 valent vaccine (PNEUMOVAX-23) injection 0.5 mL (has no administration in time range)  alteplase (ACTIVASE) 1 mg/mL infusion SOLN 76.3 mg (0 mg Intravenous Stopped 11/04/20 1007)    Followed by  0.9 %  sodium chloride infusion (0 mLs Intravenous Stopped 11/04/20 1014)  iohexol (OMNIPAQUE) 350 MG/ML injection 60 mL (60 mLs Intravenous Contrast Given 11/04/20 0907)  labetalol (NORMODYNE) injection 10 mg (10 mg Intravenous Given 11/04/20 1038)  tranexamic acid (CYKLOKAPRON) IVPB 1,000 mg (0 mg Intravenous Stopped 11/04/20 1057)   stroke:  mapping our early stages of recovery book ( Does not apply Given 11/04/20  1446)  iohexol (OMNIPAQUE) 350 MG/ML injection 100 mL (100 mLs Intravenous Contrast Given 11/04/20 1717)    ED Course  I have reviewed the triage vital signs and the nursing notes.  Pertinent labs & imaging results that were available during my care of the patient were reviewed by me and considered in my medical decision making (see chart for details).    MDM Rules/Calculators/A&P                           Patient is a 65 year old male with history of brain aneurysms who presents for painless loss of vision in his right eye this morning.  Onset of symptoms was 1.5 hours prior to arrival.  Upon arrival, patient is alert and calm.  He reports no resolution of his vision loss.  He also reports no preceding symptoms.  On exam, he has no other focal neurologic deficits.  Presentation is consistent with CRAO.  Given the timeline, code stroke was called.  Neurology team present at bedside immediately.  I did reach out to ophthalmology as well.  Ophthalmology stated that they would come see the patient soon as possible.  They stated that in the meantime, patient could try ocular massage.  CT scan showed no LVO and patent stents.  Following CT scan, patient was instructed on technique of ocular massage.  Patient was admitted to neurology service.  Neurology team initiated tPA.  While still in the ED, patient did complain of a headache.  Repeat CT the scan was obtained and did show some subarachnoid hemorrhage.  tPA was stopped.  tPA was reversed using TXA.  Patient transported to neurology service for further management.  Final Clinical Impression(s) / ED Diagnoses Final diagnoses:  Sudden visual loss of right eye    Rx / DC Orders ED Discharge Orders     None        Gloris Manchester, MD 11/04/20 1827

## 2020-11-04 NOTE — ED Notes (Signed)
This RN called [facility] to activate code stroke per Sal, MD Neuro

## 2020-11-04 NOTE — Progress Notes (Signed)
Patient belongings on admission: Underwear Pants, Belt Socks Shoes [employer] Cell phone wallet

## 2020-11-04 NOTE — Code Documentation (Signed)
MD Khaliqdina at the bedside assessing and explaining to patient plan of care.   BP < 140, Q30 x 6 hours, q1 hours x16 starting at 1115.   Patient to be admitted to the ICU.

## 2020-11-04 NOTE — ED Triage Notes (Signed)
Pt comes from home via EMS. Pt reports that he woke up normal this morning then at 0700 then sudden onset of right eye blindness and light headed. Pt reports hx of brain aneurysm. PCP dc'd brilnita 2 weeks ago. Head pain 1/10. Pt a/o x4. 18g IV established in LAC. BP 141/85 HR 70 RR 16 O2 96% RA CBG 106

## 2020-11-04 NOTE — Plan of Care (Signed)
  Problem: Education: Goal: Knowledge of disease or condition will improve Outcome: Progressing Goal: Knowledge of secondary prevention will improve Outcome: Progressing   Problem: Self-Care: Goal: Ability to participate in self-care as condition permits will improve Outcome: Progressing Goal: Verbalization of feelings and concerns over difficulty with self-care will improve Outcome: Progressing   Problem: Nutrition: Goal: Risk of aspiration will decrease Outcome: Progressing Goal: Dietary intake will improve Outcome: Progressing   Problem: Self-Care: Goal: Ability to communicate needs accurately will improve Outcome: Completed/Met

## 2020-11-04 NOTE — Code Documentation (Signed)
1000 - At 1000 assessment, wife reported that patient was complaining of headache. Reported that he did have a headache this morning prior to getting medication 5/10.   Evaluated the patient and no change in neuro exam. MD called and made aware of the headache worsening to a 7/10 at this time. Patient reports posterior headache that is dull.  MD requested repeat CT Head. tPA flush paused and patient taken to CT by this RN.   1015 - MD made aware CT was completed and patient was returned to the room.

## 2020-11-04 NOTE — Progress Notes (Signed)
PT Cancellation Note  Patient Details Name: Victor Pena MRN: [id number] DOB: 1955/07/15   Cancelled Treatment:    Reason Eval/Treat Not Completed: Medical issues which prohibited therapy. Per RN, pt received tPA around 9:00 am this date and then had hemorrhagic conversion. Will plan to hold this date and follow-up as appropriate tomorrow.   [name], PT, DPT Acute Rehabilitation Services  Pager: [phone] Office: [phone]    [name] 11/04/2020, 4:34 PM

## 2020-11-04 NOTE — Progress Notes (Signed)
PHARMACIST CODE STROKE RESPONSE  Notified to mix tPA at 0901 by Dr. Erick Blinks Delivered tPA to RN at [phone]  tPA dose = 7.6 mg bolus over 1 minute followed by 68.7 mg for a total dose of 76.3 mg over 1 hour  Issues/delays encountered (if applicable): delay in code stroke page out but neurology already aware and seeing patient before  Sherron Monday, PharmD, BCCCP Clinical Pharmacist  Phone: [phone] 11/04/2020 9:17 AM  Please check AMION for all [facility] Pharmacy phone numbers After 10:00 PM, call Main Pharmacy [phone]

## 2020-11-04 NOTE — Progress Notes (Addendum)
Notified by team that patient reports worsening headache. Had a 5/10 headache this morning that he did not endorse to me earlier. Headache is now a 7/10. tPA paused and a STAT CTH was obtained which demonstrated small volume SAH in sulci of both occipital lobes.  tPA reversal with TXA and Cryoprecipitate. Patient has no change in exam. No improvement in vision in R eye.  Patient and wife at bedside were update on plan of care.  Erick Blinks [facility] Pager Number [phone]

## 2020-11-05 ENCOUNTER — Inpatient Hospital Stay (HOSPITAL_COMMUNITY): Payer: Medicare Other

## 2020-11-05 DIAGNOSIS — I6389 Other cerebral infarction: Secondary | ICD-10-CM

## 2020-11-05 DIAGNOSIS — I6340 Cerebral infarction due to embolism of unspecified cerebral artery: Secondary | ICD-10-CM | POA: Insufficient documentation

## 2020-11-05 LAB — PREPARE CRYOPRECIPITATE
Unit division: 0
Unit division: 0

## 2020-11-05 LAB — BPAM CRYOPRECIPITATE
Blood Product Expiration Date: 202208031820
Blood Product Expiration Date: 202208031820
ISSUE DATE / TIME: 202208031313
ISSUE DATE / TIME: 202208031401
Unit Type and Rh: 5100
Unit Type and Rh: 5100

## 2020-11-05 LAB — ECHOCARDIOGRAM COMPLETE
Area-P 1/2: 3.12 cm2
S' Lateral: 3.3 cm
Weight: 2927.71 oz

## 2020-11-05 MED ORDER — LORAZEPAM 2 MG/ML IJ SOLN
1.0000 mg | INTRAMUSCULAR | Status: DC | PRN
  Administered 2020-11-05: 1 mg via INTRAVENOUS
  Filled 2020-11-05: qty 1

## 2020-11-05 MED ORDER — PANTOPRAZOLE SODIUM 40 MG PO TBEC
40.0000 mg | DELAYED_RELEASE_TABLET | Freq: Every day | ORAL | Status: DC
  Administered 2020-11-05: 40 mg via ORAL
  Filled 2020-11-05: qty 1

## 2020-11-05 NOTE — Progress Notes (Signed)
  Echocardiogram 2D Echocardiogram has been performed.  Delcie Roch 11/05/2020, 5:43 PM

## 2020-11-05 NOTE — Evaluation (Signed)
Occupational Therapy Evaluation and Discharge Patient Details Name: Victor Pena MRN: [id number] DOB: 02/22/1956 Today's Date: 11/05/2020    History of Present Illness The pt is a 65 yo male presenting 8/3 with acute onset R eye blindness and feeling light headed. tPA was administered on 8/3, pt subsequently reporting increase in headache and imaging revealed SAH in bilateral occipital lobes. 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   Clinical Impression   This 65 yo male admitted with above presents to acute OT with PLOF of being totally independent with all basic ADLs, IADLs, working full time, and driving. Currently he is at a Mod I for basic ADLs with S needed IADLs that have any safety concerns (ie: cooking), driving (to be cleared by MD) all due to new right eye vision loss. OPOT has been recommended, no further OT needs, we will sign off.    Follow Up Recommendations  Outpatient OT          Precautions / Restrictions Precautions Precautions: Fall Precaution Comments: low fall Restrictions Weight Bearing Restrictions: No      Mobility Bed Mobility              General bed mobility comments: Up in recliner            ADL either performed or assessed with clinical judgement   ADL Overall ADL's : Modified independent                                       General ADL Comments: Due to new full vision loss in right eye. He will have to think through some activities more, be aware of height differences (ie: steps with depth perception), be aware of when he is barbecuing where he is standing in relation to grill, when in a new environment he will need to scan the room/area to get a "lay of the land".     Vision Baseline Vision/History:  Wears glasses Wears Glasses: At all times Patient Visual Report: Other (comment) (can't see out of right eye at all.) Vision Assessment?: Yes Eye Alignment:  (right eye is slightly off of midline.) Ocular Range of Motion: Within Functional Limits Alignment/Gaze Preference: Within Defined Limits Tracking/Visual Pursuits: Able to track stimulus in all quads without difficulty Visual Fields:  (does not appear to have a visual field cut in left eye with gross testing) Depth Perception:  (for reaching out and touching my finger with his in his right visual field he always goes to the outside of my finger. Spot on when reaching and touching my finger in his left visual field.) Additional Comments: I gave him some suggestions as far as reading if he is struggling this (ie: using finger to scan while he reads, using a piece of paper to read line by line)            Pertinent Vitals/Pain Pain Assessment: No/denies pain     Hand Dominance Right   Extremity/Trunk Assessment Upper Extremity Assessment Upper Extremity Assessment: Overall WFL for tasks assessed     Communication Communication Communication: No difficulties   Cognition Arousal/Alertness: Awake/alert Behavior During Therapy: WFL for tasks assessed/performed Overall Cognitive Status: Within Functional Limits for tasks assessed  General Comments: Made him aware that he should not be driving at this time and that an MD would have to give him clearance to drive as appropriate based off of vision deficit. He did report that he he only lives a short distance from work and probably could get there safely driving--made him aware he should not be doing this.   General Comments  VSS            Home Living Family/patient expects to be discharged to:: Private residence Living Arrangements: Spouse/significant other Available Help at Discharge: Family;Available 24 hours/day Type of Home:  House Home Access: Stairs to enter [employer] of Steps: 6 Entrance Stairs-Rails: Right Home Layout: Multi-level;Able to live on main level with bedroom/bathroom Alternate Level Stairs-Number of Steps: 2 flights of stairs to "man cave" where pt spends most of his time Alternate Level Stairs-Rails: Right;Left Bathroom Shower/Tub: Producer, television/film/video: Standard     Home Equipment: Crutches   Additional Comments: pt has ability to live on main level, but primarily uses bathroom on 3rd floor man cave, unsure about crtuches.      Prior Functioning/Environment Level of Independence: Independent        Comments: independent, working full time as Technical sales engineer, wife and pt report they "get all the exercise we need" from walking up and down the stairs at their house        OT Problem List: Impaired vision/perception         OT Goals(Current goals can be found in the care plan section) Acute Rehab OT Goals Patient Stated Goal: to be able to drive again                           AM-PAC OT "6 Clicks" Daily Activity     Outcome Measure Help from another person eating meals?: None Help from another person taking care of personal grooming?: None Help from another person toileting, which includes using toliet, bedpan, or urinal?: None Help from another person bathing (including washing, rinsing, drying)?: None Help from another person to put on and taking off regular upper body clothing?: None Help from another person to put on and taking off regular lower body clothing?: None 6 Click Score: 24   End of Session    Activity Tolerance: Patient tolerated treatment well Patient left: in chair;with call bell/phone within reach  OT Visit Diagnosis: Other (comment) (no vision one eye)                Time: 4742-5956 OT Time Calculation (min): 32 min Charges:  OT General Charges $OT Visit: 1 Visit OT Evaluation $OT Eval Moderate Complexity: 1 Mod OT  Treatments $Self Care/Home Management : 8-22 mins  Ignacia Palma, OTR/L Acute Rehab Services Pager [phone] Office [phone]    Evette Georges 11/05/2020, 2:00 PM

## 2020-11-05 NOTE — Care Management (Signed)
Notified by OT that she is recommending OP follow up at discharge.  Referral made to [facility] for follow up, and information placed on AVS.   Quintella Baton, RN, BSN  Trauma/Neuro ICU Case Manager [phone]

## 2020-11-05 NOTE — Evaluation (Signed)
Physical Therapy Evaluation and Discharge Patient Details Name: Victor Pena MRN: [id number] DOB: [birth year] Today's Date: 11/05/2020   History of Present Illness  The pt is a 65 yo male presenting 8/3 with acute onset R eye blindness and feeling light headed. tPA was administered on 8/3, pt subsequently reporting increase in headache and imaging revealed SAH in bilateral occipital lobes. 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   Clinical Impression  Pt in bed upon arrival of PT, agreeable to evaluation at this time. Prior to admission the pt was completely independent, working as an Technical sales engineer. The pt now presents with minor limitations in functional mobility due to above dx, and is safe to return home with family supervision. The pt completed all bed mobility and transfers without evidence of instability or needing assist, and completed both hallway ambulation and stair navigation with supervision for safety and use of single rail on stairs. The pt was educated in importance of visual scanning techniques, and was able to demo good stability with challenges such as head nods/turns with all mobility. No further acute PT needs identified at this time, thank you for the consult.     Follow Up Recommendations No PT follow up;Supervision - Intermittent    Equipment Recommendations  None recommended by PT    Recommendations for Other Services       Precautions / Restrictions Precautions Precautions: Fall Precaution Comments: low fall Restrictions Weight Bearing Restrictions: No      Mobility  Bed Mobility Overal bed mobility: Independent                  Transfers Overall transfer level: Modified independent Equipment used: None             General  transfer comment: slightly increased time, line management only. pt with no evidence of instability  Ambulation/Gait Ambulation/Gait assistance: Supervision Gait Distance (Feet): 125 Feet Assistive device: None Gait Pattern/deviations: WFL(Within Functional Limits) Gait velocity: WFL Gait velocity interpretation: 1.31 - 2.62 ft/sec, indicative of limited community ambulator General Gait Details: pt reporting gait feels to be at his baseline. no LOB or instability. the pt was able to remain steady with added challenge of head turns and avoiding objects.  Stairs Stairs: Yes Stairs assistance: Supervision Stair Management: One rail Left;Alternating pattern;Forwards Number of Stairs: 12 General stair comments: pt completed a flight of stairs with alternating pattern and use of single rail, no assist needed or evidence of instabiltiy  Wheelchair Mobility    Modified Rankin (Stroke Patients Only) Modified Rankin (Stroke Patients Only) Pre-Morbid Rankin Score: No symptoms Modified Rankin: Slight disability     Balance Overall balance assessment: Mild deficits observed, not formally tested                                           Pertinent Vitals/Pain Pain Assessment: No/denies pain    Home Living Family/patient expects to be discharged to:: Private residence Living Arrangements: Spouse/significant other Available Help at Discharge: Family;Available 24 hours/day Type of Home: House Home Access: Stairs to enter Entrance Stairs-Rails: Right Entrance Stairs-Number of Steps: 6 Home Layout: Multi-level;Able to live on main level with bedroom/bathroom Home Equipment: Crutches Additional Comments: pt has ability to live on  main level, but primarily uses bathroom on 3rd floor man cave, unsure about crtuches.    Prior Function Level of Independence: Independent         Comments: independent, working full time as Technical sales engineer, wife and pt report they "get all the  exercise we need" from walking up and down the stairs at their house     Hand Dominance   Dominant Hand: Right    Extremity/Trunk Assessment   Upper Extremity Assessment Upper Extremity Assessment: Defer to OT evaluation;Overall [facility] for tasks assessed    Lower Extremity Assessment Lower Extremity Assessment: Overall WFL for tasks assessed    Cervical / Trunk Assessment Cervical / Trunk Assessment: Normal  Communication   Communication: No difficulties  Cognition Arousal/Alertness: Awake/alert Behavior During Therapy: WFL for tasks assessed/performed Overall Cognitive Status: Within Functional Limits for tasks assessed                                 General Comments: grossly functional, pt answering all questions aaccurately. pt with one mistake, stating "I can tell a difference between tomorrow and today" when asked about is vision, pt later corrected to " I can tell a difference between yesterday and today" when cued by wife      General Comments General comments (skin integrity, edema, etc.): VSS    Exercises     Assessment/Plan    PT Assessment Patent does not need any further PT services         PT Goals (Current goals can be found in the Care Plan section)  Acute Rehab PT Goals Patient Stated Goal: return to work PT Goal Formulation: With patient Time For Goal Achievement: 11/19/20 Potential to Achieve Goals: Good     AM-PAC PT "6 Clicks" Mobility  Outcome Measure Help needed turning from your back to your side while in a flat bed without using bedrails?: None Help needed moving from lying on your back to sitting on the side of a flat bed without using bedrails?: None Help needed moving to and from a bed to a chair (including a wheelchair)?: None Help needed standing up from a chair using your arms (e.g., wheelchair or bedside chair)?: None Help needed to walk in hospital room?: A Little Help needed climbing 3-5 steps with a railing? : A  Little 6 Click Score: 22    End of Session Equipment Utilized During Treatment: Gait belt Activity Tolerance: Patient tolerated treatment well Patient left: in chair;with family/visitor present Nurse Communication: Mobility status PT Visit Diagnosis: Other abnormalities of gait and mobility (R26.89)    Time: 0947-0962 PT Time Calculation (min) (ACUTE ONLY): 23 min   Charges:   PT Evaluation $PT Eval Low Complexity: 1 Low PT Treatments $Gait Training: 8-22 mins        Lazarus Gowda, PT, DPT   Acute Rehabilitation Department Pager #: [phone]  Gaetana Michaelis 11/05/2020, 1:47 PM

## 2020-11-05 NOTE — Progress Notes (Addendum)
STROKE TEAM PROGRESS NOTE    Interval History   No acute events overnight. He received IVTPA yesterday and after reporting a headache thus IVTPA was stopped and reversed using cryoprecipitate and CTH showed small posterior fossa SAH. He reports no headache this morning.  Asked about when his vision loss began yesterday- he states that he lost vision to the right eye while in the shower yesterday morning.   He was educated about his CRAO and post tPA small SAH. Informed about the need for an MRI Brain today. Was also instructed not to drive in the imminent future and the need for occupational therapy to adjust to his new baseline with right eye vision loss.   On examination he has no light perception or movement to the right eye.   Pertinent Lab Work and Imaging    11/04/20 CT Head WO IV Contrast There is no acute intracranial hemorrhage or evidence of acute infarction. ASPECT score is 10  11/04/20 CT Angio Head and Neck W WO IV Contrast  11/04/20 CT Head ( Ordered due to headache post IVTPA)  Some subarachnoid hemorrhage now evident in the sulci of both occipital regions. No evidence of intraparenchymal hemorrhage.  11/05/20 MRI Brain WO IV Contrast Ordered results pending   11/05/20 Echocardiogram Complete  Ordered, results pending   Physical Examination   Constitutional: Calm, appropriate for condition  Cardiovascular: Normal RR Respiratory: No increased WOB   Mental status: AAOx4, following commands  Speech: Fluent with repetition and naming intact  Cranial nerves: EOMI, No vision to the right eye, VFF left eye, Face symmetric, Tongue midline  Motor: Normal bulk and tone. No drift. Strength 5/5 throughout  Sensory: Sensation intact to light touch  Coordination: FNF + HTS  Gait: Deferred   Assessment and Plan   Victor Pena is a 65 y.o. 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   NEURO #R CRAO  Patient presented with the symptoms described above. At this time, his stroke work up is ongoing. 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 After IVTPA was started, it was stopped due to headache and repeat CTH revealed SAH to both occipital lobes. Stroke labs with LDL 68 and hemoglobin A1c 5.5 Are waiting on MRI Brain, Echocardiogram to complete stroke work up.  May need outpatient prolonged cardiac monitoring for paroxysmal A. fib after discharge.  Patient educated about CRAO and instructed not to drive in the immediate future until he adjusts to his vision loss and works with OT. For secondary stroke prevention will start Aspirin 81 mg 24 hours post IVTPA.  -Will not initiate Aspirin today for stroke prevention secondary to subarachnoid hemorrhage and wait for 2 to 3 days - Continue Atorvastatin 40 mg for stroke prevention  - Continue to follow up in stroke clinic at discharge   #History of SAH  #History of Right Cavernous Carotid and Left MCA bifurcation aneurysms  Patient follows up in stroke clinic for aneurysm/prior SAH. He underwent diagnostic cerebral angiogram on 07/19/2019 that showed a 4.4 x 4.2 mm wide necked left MCA bifurcation embolism and a right ICA terminal aneurysm measuring 5.6 x 3 mm with superimposed pseudolobation. Was scheduled to undergo elective treatment of the right sided aneurysm however unfortunately presented as a code stroke with expressive aphasia prior to this being done. At that  time, CTH showed  left frontal convexity localized subarachnoid hemorrhage likely from leaking left MCA bifurcation aneurysm. He underwent balloon assisted coiling of leaking left MCA aneurysm without incident. Later on 10/02/2019 underwent right ICA aneurysm treatment with pipeline stent which was uneventful. For full details regarding this aneurysmal/SAH history please view  office visit note on 10/16/19 by Dr. Pearlean Brownie.   CARDS He does not have a history of hypertension and blood pressures this admission have trended normotensive with occasional elevations to the 140 range. Blood pressure goal post IVTPA is < 180. - SBP goal < 180   #Hyperlipidemia From a stroke prevention stand point, the LDL goal is < 70. His LDL is at goal at 68. Home Atorvastatin 40 mg has been continued this admission which has been effectively controlling his LDL.   RESP Oxygenating well on RA. No acute respiratory issues at this time  RENAL No electrolyte or Creatinine abnormalities, will trend   GI #Stroke Dysphagia Screening  Passed bedside swallow evaluation, on heart healthy diet   ENDO #Stroke Diabetes Screening  Hemoglobin A1C this admission noted to be 5.5, at goal < 7 from a stroke standpoint. He does not have a history of diabetes.  HEME Hemoglobin hematocrit and platelet count stable. SCDs for DVT Prophylaxis.   ID Afebrile. No infectious processes at this time.   Hospital day # 1  Stark Jock, NP  [facility] Nurse Practitioner Patient seen and discussed with attending physician Dr. Priscille Heidelberg MD NOTE : I have personally obtained history,examined this patient, reviewed notes, independently viewed imaging studies, participated in medical decision making and plan of care.ROS completed by me personally and pertinent positives fully documented  I have made any additions or clarifications directly to the above note. Agree with note above.  Patient presented with sudden onset of painless right eye vision loss felt to be central retinal artery occlusion.  He was even evaluated by ophthalmologist in the hospital to confirm the diagnosis.  After due discussion of risk-benefit IV tPA was administered but shortly thereafter he complained of moderate posterior headache hence tPA was stopped and CT scan showed small occipital subarachnoid hemorrhage and tPA was  reversed with cryoprecipitate infusion.  Patient is remained stable overnight and has no headaches symptoms this morning except with persistent right eye vision loss.  MRI scan of the brain obtained this morning shows small bilateral occipital lobe parenchymal hematomas with localized subarachnoid hemorrhage posteriorly.  No ischemic infarct is noted.  Recommend close neurological monitoring and strict blood pressure control with systolic below 140 for the first 24 hours.  Mobilize out of bed.  Therapy consults.  Check echocardiogram.  He may need outpatient prolonged cardiac monitoring for paroxysmal A. fib after discharge.  We will hold aspirin for 2 -3 days given his post tPA hemorrhage and then start it later.  Patient advised not to drive till cleared by ophthalmologist upon outpatient follow-up visit Long discussion with patient and his wife and answered questions.This patient is critically ill and at significant risk of neurological worsening, death and care requires constant monitoring of vital signs, hemodynamics,respiratory and cardiac monitoring, extensive review of multiple databases, frequent neurological assessment, discussion with family, other specialists and medical decision making of high complexity.I have made any additions or clarifications directly to the above note.This critical care time does not reflect procedure time, or teaching time or supervisory time of PA/NP/Med Resident etc but could involve care discussion time.  I spent 30 minutes of neurocritical care  time  in the care of  this patient.     Delia Heady, MD Medical Director [facility] Pager: [phone] 11/05/2020 1:39 PM  To contact Stroke Continuity provider, please refer to [email or web address]. After hours, contact General Neurology

## 2020-11-06 ENCOUNTER — Other Ambulatory Visit: Payer: Self-pay | Admitting: Medical

## 2020-11-06 DIAGNOSIS — H3411 Central retinal artery occlusion, right eye: Secondary | ICD-10-CM

## 2020-11-06 NOTE — Progress Notes (Signed)
OT NOTE  Pt and wife asking questions regarding how to access drivers rehab. The RN asking OT to come give information. OT provided handouts for [facility] drivers [facility] and educated to follow doctors recommendations for return to driving. Wife expressed concern with safety for driving. All questions answered and education at this time. OT order complete in prior session so OT note to explain time in chart and need to give further handouts.     Victor Pena, OTR/L  Acute Rehabilitation Services Pager: [phone] Office: [phone] .

## 2020-11-06 NOTE — Discharge Instructions (Signed)
No driving until cleared by ophthalmologist upon outpatient follow-up visit. Restart aspirin 81 mg daily on Monday August 8th.  Outpatient occupational therapy will be arranged. An outpatient cardiac monitor will be arranged. May return to work as tolerated.

## 2020-11-06 NOTE — TOC Transition Note (Signed)
Transition of Care [facility]) - CM/SW Discharge Note   Patient Details  Name: Victor Pena MRN: [id number] Date of Birth: [birth year]  Transition of Care [facility]) CM/SW Contact:  Glennon Mac, RN Phone Number: 11/06/2020, 11:40 AM   Clinical Narrative:  The pt is a 65 yo male presenting 8/3 with acute onset R eye blindness and feeling light headed. tPA was administered on 8/3, pt subsequently reporting increase in headache and imaging revealed SAH in bilateral occipital lobes. 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  PTA, pt independent and living at home with spouse, who can provide assistance at dc.  PT recommending no OP follow up; OT recommending OP follow up.  Referral has been made to [facility] for follow up.      Final next level of care: OP Rehab Barriers to Discharge: Barriers Resolved            Discharge Plan and Services   Discharge Planning Services: CM Consult                                 Social Determinants of Health (SDOH) Interventions     Readmission Risk Interventions Readmission Risk Prevention Plan 11/06/2020  Post Dischage Appt Complete  Medication Screening Complete  Transportation Screening Complete  Some recent data might be hidden   Quintella Baton, RN, BSN  Trauma/Neuro ICU Case Manager [phone]

## 2020-11-06 NOTE — Discharge Summary (Addendum)
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   S/p IV TPA administartion Subarachnoid hemorrhage in the sulci of both occipital regions following IV tPA administration No vision in right eye   Past Medical History:  Diagnosis Date   Cerebral aneurysm    Diplopia    Intracranial aneurysm    Seasonal allergies    Wears glasses    Past Surgical History:  Procedure Laterality Date   BACK SURGERY     COLONOSCOPY     IR 3D INDEPENDENT WKST  07/19/2019   IR 3D INDEPENDENT WKST  07/19/2019   IR 3D INDEPENDENT WKST  08/06/2019   IR 3D INDEPENDENT WKST  08/19/2019   IR 3D INDEPENDENT WKST  10/02/2019   IR ANGIO EXTERNAL CAROTID SEL EXT CAROTID BILAT MOD SED  07/19/2019   IR ANGIO INTRA EXTRACRAN SEL COM CAROTID INNOMINATE BILAT MOD SED  03/17/2020   IR ANGIO INTRA EXTRACRAN SEL INTERNAL CAROTID BILAT MOD SED  07/19/2019   IR ANGIO INTRA EXTRACRAN SEL INTERNAL CAROTID BILAT MOD SED  10/02/2019   IR ANGIO INTRA EXTRACRAN SEL INTERNAL CAROTID BILAT MOD SED  10/20/2020   IR ANGIO VERTEBRAL SEL SUBCLAVIAN INNOMINATE UNI L MOD SED  03/17/2020   IR ANGIO VERTEBRAL SEL VERTEBRAL BILAT MOD SED  07/19/2019   IR ANGIO VERTEBRAL SEL VERTEBRAL BILAT MOD SED  10/20/2020   IR ANGIO VERTEBRAL SEL VERTEBRAL UNI R MOD SED  10/02/2019   IR ANGIO VERTEBRAL SEL VERTEBRAL UNI R MOD SED  03/17/2020   IR ANGIOGRAM FOLLOW UP STUDY  08/06/2019   IR ANGIOGRAM FOLLOW UP STUDY  08/06/2019   IR ANGIOGRAM FOLLOW UP STUDY  08/06/2019   IR ANGIOGRAM FOLLOW UP STUDY  08/06/2019   IR ANGIOGRAM FOLLOW UP STUDY  08/06/2019   IR ANGIOGRAM FOLLOW UP STUDY  08/06/2019   IR ANGIOGRAM  FOLLOW UP STUDY  10/02/2019   IR CT HEAD LTD  08/06/2019   IR CT HEAD LTD  08/19/2019   IR CT HEAD LTD  10/02/2019   IR INTRA CRAN STENT  08/19/2019   IR NEURO EACH ADD'L AFTER BASIC UNI LEFT (MS)  08/06/2019   IR TRANSCATH/EMBOLIZ  08/06/2019   IR TRANSCATH/EMBOLIZ  10/02/2019       IR TRANSCATH/EMBOLIZ  10/02/2019   IR US GUIDE VASC ACCESS RIGHT  07/19/2019   IR US GUIDE VASC ACCESS RIGHT  08/06/2019   IR US GUIDE VASC ACCESS RIGHT  08/19/2019   IR US GUIDE VASC ACCESS RIGHT  10/02/2019   IR US GUIDE VASC ACCESS RIGHT  03/17/2020   IR US GUIDE VASC ACCESS RIGHT  10/20/2020   RADIOLOGY WITH ANESTHESIA N/A 08/06/2019   Procedure: IR WITH ANESTHESIA;  Surgeon: Radiologist, Medication, MD;  Location: [place];  Service: Radiology;  Laterality: N/A;   RADIOLOGY WITH ANESTHESIA N/A 08/19/2019   Procedure: STENT PLACEMENT;  Surgeon: Pedro Earls, MD;  Location: [place];  Service: Radiology;  Laterality: N/A;   RADIOLOGY WITH ANESTHESIA N/A 10/02/2019   Procedure: RADIOLOGY WITH ANESTHESIA  EMBOLIZATION;  Surgeon: Pedro Earls, MD;  Location: [place];  Service: Radiology;  Laterality: N/A;   WISDOM TOOTH EXTRACTION      Family History Family History  Problem Relation Age of Onset   Aneurysm Father     Social History  reports that he has been smoking cigarettes. He has been smoking an average of .25 packs per day. He has never used smokeless tobacco. He reports current alcohol use of about 10.0 standard drinks of alcohol per week. He reports previous drug use.  Allergies as of 11/06/2020       Reactions   Other Other (See Comments)   seasonal        Medication List     STOP taking these medications    aspirin 81 MG EC tablet   nicotine 14 mg/24hr patch Commonly known as: NICODERM CQ - dosed in mg/24 hours       TAKE these medications    acetaminophen 500 MG tablet Commonly known as: TYLENOL Take 1,000 mg by mouth every 6 (six) hours as needed (Back pain).        ASK your doctor about these medications    atorvastatin 40 MG tablet Commonly known as: LIPITOR TAKE 1 TABLET BY MOUTH EVERY DAY   calcipotriene-betamethasone external suspension Commonly known as: Taclonex Apply topically daily.        HOME MEDICATIONS PRIOR TO ADMISSION Medications Prior to Admission  Medication Sig Dispense Refill   acetaminophen (TYLENOL) 500 MG tablet Take 1,000 mg by mouth every 6 (six) hours as needed (Back pain).     aspirin 81 MG EC tablet Take 81 mg by mouth daily. Swallow whole.     atorvastatin (LIPITOR) 40 MG tablet TAKE 1 TABLET BY MOUTH EVERY DAY (Patient taking differently: Take 40 mg by mouth daily.) 90 tablet 1   calcipotriene-betamethasone (TACLONEX) external suspension Apply topically daily. (Patient not taking: Reported on 10/14/2020) 120 g 2   nicotine (NICODERM CQ - DOSED IN MG/24 HOURS) 14 mg/24hr patch Place 1 patch (14 mg total) onto the skin daily. (Patient not taking: Reported on 10/14/2020) 28 patch 0     HOSPITAL MEDICATIONS  atorvastatin  40 mg Oral Daily   Chlorhexidine Gluconate Cloth  6 each Topical Daily   nicotine  14 mg Transdermal Daily   pantoprazole  40 mg Oral QHS   senna-docusate  1 tablet Oral BID    LABORATORY STUDIES CBC    Component Value Date/Time   WBC 6.5 11/04/2020 0914   RBC 4.04 (L) 11/04/2020 0914   HGB 13.9 11/04/2020 0914   HCT 40.8 11/04/2020 0914   HCT 36.7 (L) 08/15/2019 0417   PLT 258 11/04/2020 0914   MCV 101.0 (H) 11/04/2020 0914   MCH 34.4 (H) 11/04/2020 0914   MCHC 34.1 11/04/2020 0914   RDW 13.2 11/04/2020 0914   LYMPHSABS 1.3 11/04/2020 0914   MONOABS 0.9 11/04/2020 0914   EOSABS 0.3 11/04/2020 0914   BASOSABS 0.0 11/04/2020 0914   CMP    Component Value Date/Time   NA 136 11/04/2020 0914   K 4.3 11/04/2020 0914   CL 105 11/04/2020 0914   CO2 22 11/04/2020 0914   GLUCOSE 89 11/04/2020 0914   BUN 12 11/04/2020 0914   CREATININE 0.88 11/04/2020 0914   CALCIUM 9.0  11/04/2020 0914   PROT 6.5 11/04/2020 0914   ALBUMIN 3.6 11/04/2020 0914   AST 34 11/04/2020 0914   ALT 28 11/04/2020 0914   ALKPHOS 55 11/04/2020 0914   BILITOT 0.8 11/04/2020 0914   GFRNONAA >60 11/04/2020 5361  GFRAA >60 10/03/2019 0500   COAGS Lab Results  Component Value Date   INR 1.0 11/04/2020   INR 0.9 10/20/2020   INR 0.9 03/17/2020   Lipid Panel    Component Value Date/Time   CHOL 131 11/04/2020 1209   TRIG 33 11/04/2020 1209   HDL 56 11/04/2020 1209   CHOLHDL 2.3 11/04/2020 1209   VLDL 7 11/04/2020 1209   LDLCALC 68 11/04/2020 1209   HgbA1C  Lab Results  Component Value Date   HGBA1C 5.5 11/04/2020   Urinalysis    Component Value Date/Time   COLORURINE STRAW (A) 11/04/2020 1152   APPEARANCEUR CLEAR 11/04/2020 1152   LABSPEC 1.025 11/04/2020 1152   PHURINE 7.0 11/04/2020 1152   GLUCOSEU NEGATIVE 11/04/2020 1152   HGBUR SMALL (A) 11/04/2020 1152   BILIRUBINUR NEGATIVE 11/04/2020 1152   [place] 11/04/2020 1152   PROTEINUR NEGATIVE 11/04/2020 1152   NITRITE NEGATIVE 11/04/2020 1152   LEUKOCYTESUR NEGATIVE 11/04/2020 1152   Urine Drug Screen No results found for: LABOPIA, COCAINSCRNUR, LABBENZ, AMPHETMU, THCU, LABBARB  Alcohol Level No results found for: [place] DIAGNOSTIC STUDIES  CT HEAD WO CONTRAST (5MM) 11/04/2020 IMPRESSION:  Unchanged multifocal intraparenchymal hemorrhage in the occipital lobes with small amount of associated subarachnoid blood. No new site of hemorrhage.   CT HEAD WO CONTRAST (5MM) 11/04/2020 IMPRESSION:  1. Stable areas of subarachnoid hemorrhage in both occipital lobes. New or more evident small rounded parenchymal hematomas.  2. Stable left-sided MCA stent.   CT HEAD WO CONTRAST (5MM) 11/04/2020 IMPRESSION:  Some subarachnoid hemorrhage now evident in the sulci of both occipital regions. No evidence of intraparenchymal hemorrhage. S  MR BRAIN WO CONTRAST 11/05/2020 IMPRESSION:  Small foci of  acute parenchymal hemorrhage in the occipital lobes and posterior sulcal subarachnoid hemorrhage bilaterally as seen on recent CT imaging. No acute infarction. Chronic/nonemergent findings detailed above.   ECHOCARDIOGRAM COMPLETE 11/05/2020 IMPRESSIONS   1. Left ventricular ejection fraction, by estimation, is 55 to 60%. The left ventricle has normal function. The left ventricle has no regional wall motion abnormalities. Left ventricular diastolic parameters are consistent with Grade I diastolic dysfunction (impaired relaxation).   2. Right ventricular systolic function is normal. The right ventricular size is normal. There is normal pulmonary artery systolic pressure. The estimated right ventricular systolic pressure is 04.1 mmHg.   3. The mitral valve is normal in structure. Trivial mitral valve regurgitation. No evidence of mitral stenosis.   4. The aortic valve is tricuspid. Aortic valve regurgitation is not visualized. No aortic stenosis is present.   5. The inferior vena cava is normal in size with greater than 50% respiratory variability, suggesting right atrial pressure of 3 mmHg.   CT VENOGRAM HEAD 11/04/2020 IMPRESSION:  Small filling defects in the lateral transverse sinus bilaterally, likely arachnoid granulation rather than small areas of thrombus. Otherwise normal enhancement of the dural venous sinuses. Subarachnoid and parenchymal hemorrhage in the occipital lobe bilaterally similar to the CT earlier today.   CT HEAD CODE STROKE WO CONTRAST 11/04/2020 IMPRESSION:  There is no acute intracranial hemorrhage or evidence of acute infarction. ASPECT score is 10.   CT ANGIO HEAD CODE STROKE CT ANGIO NECK CODE STROKE 11/04/2020 IMPRESSION:  No large vessel occlusion. Intracranial right ICA flow diverting stent is patent. 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 Within this  limitation, patent left M1-M2 MCA stent and no apparent recurrent aneurysm.  HISTORY OF PRESENT ILLNESS (From H&P by Donnetta Simpers, MD  performed 11/04/20) Victor Pena is a 65 y.o. 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 Persistent filling of the aneurysm which appear decreased in size when compared to prior angiogram. 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 He presents with sudden onset painless R eye vision loss. Was in the shower at 0700 when he had sudden onset painless R eye vision loss. No curtain falling, no redness in the eye. No trauma, no fall. He reports a rim of peripheral vision in the temporal aspect of his R eye. He was asked to come to the ED by his Neuro radiologist Dr. Karenann Cai who reached out to me and a stroke code was activated in the ED by me upon my immediate evaluation. STAT CTH was obtained and in the scanner, he reported that some of his vision is coming back in the R temporal visual field. He still had very significant vision loss. tPA was offered. mRS: 0 tPA: yes, offered. Discussed risks and benefits including 15-20% chance of improvement in vision and about 3% chance of ICH. Patient agreed to tPA. Thrombectomy:  No LVO, not offered.   HOSPITAL COURSE Victor Pena is a 65 y.o. 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   NEURO #R CRAO Patient presented with the symptoms described above. At this time, his stroke work up is ongoing. 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 After IVTPA was started, it was stopped due to headache and repeat CTH revealed SAH to both occipital lobes. Stroke labs with LDL 68 and hemoglobin A1c 5.5 Are waiting on MRI Brain, Echocardiogram to complete stroke work up.  May need outpatient prolonged cardiac monitoring for paroxysmal A. fib after discharge.  Patient educated about CRAO and instructed not to drive in the immediate future until he adjusts to his vision loss and works with OT. For secondary stroke prevention will start Aspirin 81 mg 24 hours post IVTPA. -Will not initiate Aspirin today for stroke prevention secondary to subarachnoid hemorrhage and wait for 2 to 3 days - Continue Atorvastatin 40 mg for stroke prevention - Continue to follow up in stroke clinic at discharge Patient advised not to drive till cleared by ophthalmologist upon outpatient follow-up visit   Ophthalmology Consult - Warden Fillers, MD 11/04/2020 - Suspect occlusion of artery proximal to globe; No plaques seen in retinal vessels - It seems unlikely given his age and history but would still consider checking inflammatory markers - ESR (2) and CRP (<0.5) to rule out the possibility of temporal arteritis - Unfortunately the loss of vision is likely permanent -  He tried ocular massage this morning and was also given TPA - Management at this point should be focused on preventing further embolic events - [name] at risk for neovascularization in the right eye and should be followed regularly by an  ophthalmologist Please have him follow-up with [facility] 6 weeks after discharge   #History of [place] #History of Right Cavernous Carotid and Left MCA bifurcation aneurysms Patient follows up in stroke clinic for aneurysm/prior SAH. He underwent diagnostic cerebral angiogram on 07/19/2019 that showed a 4.4 x 4.2 mm wide necked left MCA bifurcation embolism and a right ICA terminal aneurysm measuring 5.6 x 3 mm with superimposed pseudolobation. Was scheduled to undergo elective treatment of the right sided aneurysm however unfortunately presented as a code stroke with expressive aphasia prior to this being done. At that time, [facility] showed  left frontal convexity localized subarachnoid hemorrhage likely from leaking left MCA bifurcation aneurysm. He underwent balloon assisted coiling of leaking left MCA aneurysm without incident. Later on 10/02/2019 underwent right ICA aneurysm treatment with pipeline stent which was uneventful.  For full details regarding this aneurysmal/SAH history please view office visit note on 10/16/19 by Dr. Leonie Man.    SAH following tPA CT 11/04/2020  - Some subarachnoid hemorrhage now evident in the sulci of both occipital regions. SBP goal < 140 mm Hg for the first 24 hours then < 160 mm Hg.  No aspirin for now Therapy Evaluations - No PT f/u ; Outpt OT   CARDS He does not have a history of hypertension and blood pressures this admission have trended normotensive with occasional elevations to the 140 range. May need outpatient prolonged cardiac monitoring for paroxysmal A. fib after discharge.   #Hyperlipidemia From a stroke prevention stand point, the LDL goal is < 70. His LDL is at goal at 68. Home Atorvastatin 40 mg has been continued this admission which has been effectively controlling his LDL.   RESP Oxygenating well on RA. No acute respiratory issues at this time   RENAL No electrolyte or Creatinine abnormalities, will trend   GI #Stroke Dysphagia Screening Passed  bedside swallow evaluation, on heart healthy diet   ENDO #Stroke Diabetes Screening Hemoglobin A1C this admission noted to be 5.5, at goal < 7 from a stroke standpoint. He does not have a history of diabetes.   HEME Hemoglobin hematocrit and platelet count stable. SCDs for DVT Prophylaxis.   ID Afebrile. No infectious processes at this time.   DISCHARGE EXAM Vitals:   11/06/20 0700 11/06/20 0800 11/06/20 0900 11/06/20 1000  BP: 129/83 124/77 (!) 121/97 127/81  Pulse: 71 74 68 (!) 58  Resp: 12 20 16 18   Temp:  98.2 F (36.8 C)    TempSrc:  Oral    SpO2: 93% 95% 95% 90%  Weight:       Constitutional: Calm, appropriate for condition Cardiovascular: Normal RR Respiratory: No increased WOB   Mental status: AAOx4, following commands Speech: Fluent with repetition and naming intact Cranial nerves: EOMI, No vision to the right eye, VFF left eye, Face symmetric, Tongue midline Motor: Normal bulk and tone. No drift. Strength 5/5 throughout Sensory: Sensation intact to light touch Coordination: FNF + HTS Gait: Deferred  DISCHARGE INSTRUCTIONS GIVEN TO THE PATIENT  No driving until cleared by ophthalmologist upon outpatient follow-up visit. Restart aspirin 81 mg daily on Monday August 8th.  Outpatient occupational therapy will be arranged. An outpatient cardiac monitor will be arranged. May return to work as tolerated.  DISCHARGE DIET  Diet Order             Diet regular Room service appropriate? Yes with Assist; Fluid consistency: Thin  Diet effective now                  liquids  DISCHARGE PLAN Disposition:  Discharged to home aspirin 81 mg daily for secondary stroke prevention to be restarted on Monday August 8th due to [facility]. Ongoing risk factor control by Primary Care Physician at time of discharge Follow-up Kristen Loader, FNP in 2 weeks. Follow-up in [facility] in 8 weeks, office to schedule an appointment.  Follow-up  Ophthalmology - Warden Fillers, MD in 6 weeks.   40 minutes were spent preparing discharge.  Mikey Bussing PA-C [facility] Pager [phone] 11/06/2020, 11:04 AM   I have personally obtained history,examined this patient, reviewed notes, independently viewed imaging studies, participated in medical decision making and plan of care.ROS completed by me personally and pertinent positives fully documented  I have made any additions or clarifications directly to the above note. Agree with note above.  Patient advised not tod rive till cleared by ophthalmology on f/u visit  Antony Contras, [place] Pager: [phone] 11/06/2020 11:37 AM

## 2020-11-06 NOTE — Progress Notes (Signed)
SLP Cancellation Note  Patient Details Name: Victor Pena MRN: [id number] DOB: [birth year]   Cancelled treatment:       Reason Eval/Treat Not Completed: SLP screened, no needs identified, will sign off   Jaskirat Schwieger, Riley Nearing 11/06/2020, 11:59 AM

## 2020-11-06 NOTE — Progress Notes (Signed)
   Asked by neurology team to arrange a 30 day monitor to evaluate etiology of CRAO. Patient is new to [facility] therefore will place order for Dr. Duke Salvia to read (DOD 11/06/20) and arrange a follow-up visit in a couple months to review results.   Beatriz Stallion, PA-C 11/06/20; 10:59 AM

## 2020-11-11 ENCOUNTER — Encounter: Payer: Self-pay | Admitting: *Deleted

## 2020-11-11 NOTE — Progress Notes (Signed)
Patient ID: Victor Pena, male   DOB: [birth year], 65 y.o.   MRN: [id number] Patient enrolled for [employer] to ship a 30 day cardiac event monitor to his address on file. Letter with instructions mailed to patient.

## 2020-11-12 DIAGNOSIS — H3411 Central retinal artery occlusion, right eye: Secondary | ICD-10-CM | POA: Diagnosis not present

## 2020-11-12 DIAGNOSIS — Z09 Encounter for follow-up examination after completed treatment for conditions other than malignant neoplasm: Secondary | ICD-10-CM | POA: Diagnosis not present

## 2020-11-12 DIAGNOSIS — I609 Nontraumatic subarachnoid hemorrhage, unspecified: Secondary | ICD-10-CM | POA: Diagnosis not present

## 2020-11-19 DIAGNOSIS — H3561 Retinal hemorrhage, right eye: Secondary | ICD-10-CM | POA: Diagnosis not present

## 2020-11-24 ENCOUNTER — Other Ambulatory Visit: Payer: Self-pay

## 2020-11-24 ENCOUNTER — Ambulatory Visit: Payer: Medicare Other | Admitting: Occupational Therapy

## 2020-11-24 DIAGNOSIS — R41842 Visuospatial deficit: Secondary | ICD-10-CM

## 2020-11-24 NOTE — Patient Instructions (Addendum)
Local Driver Evaluation Programs: ° °Comprehensive Evaluation: includes clinical and in vehicle behind the wheel testing by OCCUPATIONAL THERAPIST. Programs have varying levels of adaptive controls available for trial.  ° °[facility] °[address] °[place], [place]  [address] °[phone] or [phone] °http://www.driver-rehab.com °Evaluator:  Cyndee Crompton, OT/CDRS/CDI/SCDCM/Low Vision Certification ° °[facility] °[address] °[place], [place] [address] °[phone] °https://www.[facility].org/home/services/rehabilitation.aspx °Evaluators:  Shannon Sheek, OT and Jill Tucker, OT ° °[facility] [place] [facility] (ONLY SERVES VETERANS!!) °[facility] °[address] °[place], [place]  [address] °[phone] °http://www.[place].va.gov/services/Physical_Medicine_Rehabilitation_Services.asp °Evaluators:  Tobie Andrews, KT; Heidi Harris, KT;  Gary Whitaker, KT (KT=kiniesotherapist) ° ° °Clinical evaluations only:  Includes clinical testing, refers to other programs or local certified driving instructor for behind the wheel testing. ° °[facility] (outpatient Rehab) °[facility] °[address] °[place], [place] [address] °[phone] for scheduling °http://www.[facility].edu/Outpatient-Rehabilitation/Neurorehabilitation-Therapy.htm °Evaluators:  Kelly Lambeth, OT; Kate Phillips, OT ° °Other area clinical evaluators available upon request including [facility], [facility] and [facility]. ° ° °    Resource List °What is a Driver Evaluation: °Your Road Ahead - A Guide to Comprehensive Driving Evaluations °http://www.thehartford.com/resources/mature-market-excellence/publications-on-aging ° °Association for [employer] - Disability and Driving Fact Sheets °http://www.aded.net/?page=510 ° °Driving after a Brain  Injury: °[employer] °http://www.biausa.org/tbims-abstracts/if-there-is-an-effective-way-to-determine-if-someone-is-ready-to-drive-after-tbi?A=SearchResult&SearchID=9495675&ObjectID=2758842&ObjectType=35 ° °Driving with Adaptive Equipment: °[facility] Process °http://www.driver-rehab.com/adaptive-equipment ° °[employer] °http://www.nmeda.com/ ° ° ° ° ° ° °  °

## 2020-11-24 NOTE — Therapy (Signed)
[facility] [address] [place], [place], [address] Phone: [phone]   Fax:  [phone]  Patient Details  Name: Victor Pena MRN: [id number] Date of Birth: [birth year] Referring Provider:  Micki Riley, MD  Encounter Date: 11/24/2020 Pt arrived accompanied by his daughter. Pt states his primary goal is to return to driving. 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 Pt reports visual fatigue at times . Therapist recommends more frequent rest breaks.  Pt was provided with information regarding local driver rehab programs. OT evaluation was deferred at this site.   OT Education - 11/24/20 1140     Education Details Information regarding community Driving Rehab programs- handout issued, recommendation that pt sees opthamologist.    Person(s) Educated Patient;Child(ren)    Methods Explanation;Handout    Comprehension Verbalized understanding              Chinenye Katzenberger 11/24/2020, 11:41 AM  [facility] [address] [place], [place], [address] Phone: [phone]   Fax:  [phone]

## 2020-11-30 DIAGNOSIS — H2513 Age-related nuclear cataract, bilateral: Secondary | ICD-10-CM | POA: Diagnosis not present

## 2020-11-30 DIAGNOSIS — H3411 Central retinal artery occlusion, right eye: Secondary | ICD-10-CM | POA: Diagnosis not present

## 2020-12-10 NOTE — Progress Notes (Signed)
[facility] Note  12/14/2020     CHIEF COMPLAINT Patient presents for Retina Evaluation   HISTORY OF PRESENT ILLNESS: Victor Pena is a 65 y.o. male who presents to the clinic today for:   HPI     Retina Evaluation   In right eye.  Duration of 1 month.  Context:  distance vision, mid-range vision and near vision.  Response to treatment was no improvement.  I, the attending physician,  performed the HPI with the patient and updated documentation appropriately.        Comments   Retina eval per Dr. Katy Fitch for CRAO OD- About 1 1/2 months ago pt states he had a "blood clot" in OD.  Lost complete vision.  Hx aneurysms on each side of his brain.  Had a total of 3 surgeries for this.  Patient states RXT is new, since this episode has happened.  States he has a h/o OS drifting but not OD until this.      Last edited by Bernarda Caffey, MD on 12/14/2020  9:03 AM.    Pt states about a month ago, he lost vision in his right eye while in the shower, he states he initially thought he had shampoo in his eye, pt states about a year ago, he had surgery for aneurysm on both sides of his brain, pt states he went to the ED when he lost vision, and they gave him TPA to dissolve the clot in his eye, which caused bleeding in his brain, pt is taking an 79m aspirin  Referring physician: GWarden Fillers MD [address] [place] [id number] HISTORICAL INFORMATION:   Selected notes from the MEDICAL RECORD NUMBER Referred by Dr. CShirleen Schirmerfor eval of CRAO OD LEE: 08.29.22 Ocular Hx- PMH-    CURRENT MEDICATIONS: No current outpatient medications on file. (Ophthalmic Drugs)   No current facility-administered medications for this visit. (Ophthalmic Drugs)   Current Outpatient Medications (Other)  Medication Sig   acetaminophen (TYLENOL) 500 MG tablet Take 1,000 mg by mouth every 6 (six) hours as needed (Back pain).   aspirin EC 81 MG tablet Take 81  mg by mouth daily. Swallow whole.   atorvastatin (LIPITOR) 40 MG tablet TAKE 1 TABLET BY MOUTH EVERY DAY (Patient taking differently: Take 40 mg by mouth daily.)   calcipotriene-betamethasone (TACLONEX) external suspension Apply topically daily. (Patient not taking: No sig reported)   No current facility-administered medications for this visit. (Other)    REVIEW OF SYSTEMS: ROS   Positive for: Neurological, HENT, Eyes Negative for: Constitutional, Gastrointestinal, Skin, Genitourinary, Musculoskeletal, Endocrine, Cardiovascular, Respiratory, Psychiatric, Allergic/Imm, Heme/Lymph Last edited by HLeonie Douglas COA on 12/14/2020  8:53 AM.     ALLERGIES Allergies  Allergen Reactions   Other Other (See Comments)    seasonal    PAST MEDICAL HISTORY Past Medical History:  Diagnosis Date   Cerebral aneurysm    Diplopia    Intracranial aneurysm    Seasonal allergies    Wears glasses    Past Surgical History:  Procedure Laterality Date   BACK SURGERY     COLONOSCOPY     IR 3D INDEPENDENT WKST  07/19/2019   IR 3D INDEPENDENT WKST  07/19/2019   IR 3D INDEPENDENT WKST  08/06/2019   IR 3D INDEPENDENT WKST  08/19/2019   IR 3D INDEPENDENT WKST  10/02/2019   IR ANGIO EXTERNAL CAROTID SEL EXT CAROTID BILAT MOD SED  07/19/2019  IR ANGIO INTRA EXTRACRAN SEL COM CAROTID INNOMINATE BILAT MOD SED  03/17/2020   IR ANGIO INTRA EXTRACRAN SEL INTERNAL CAROTID BILAT MOD SED  07/19/2019   IR ANGIO INTRA EXTRACRAN SEL INTERNAL CAROTID BILAT MOD SED  10/02/2019   IR ANGIO INTRA EXTRACRAN SEL INTERNAL CAROTID BILAT MOD SED  10/20/2020   IR ANGIO VERTEBRAL SEL SUBCLAVIAN INNOMINATE UNI L MOD SED  03/17/2020   IR ANGIO VERTEBRAL SEL VERTEBRAL BILAT MOD SED  07/19/2019   IR ANGIO VERTEBRAL SEL VERTEBRAL BILAT MOD SED  10/20/2020   IR ANGIO VERTEBRAL SEL VERTEBRAL UNI R MOD SED  10/02/2019   IR ANGIO VERTEBRAL SEL VERTEBRAL UNI R MOD SED  03/17/2020   IR ANGIOGRAM FOLLOW UP STUDY  08/06/2019   IR ANGIOGRAM FOLLOW UP  STUDY  08/06/2019   IR ANGIOGRAM FOLLOW UP STUDY  08/06/2019   IR ANGIOGRAM FOLLOW UP STUDY  08/06/2019   IR ANGIOGRAM FOLLOW UP STUDY  08/06/2019   IR ANGIOGRAM FOLLOW UP STUDY  08/06/2019   IR ANGIOGRAM FOLLOW UP STUDY  10/02/2019   IR CT HEAD LTD  08/06/2019   IR CT HEAD LTD  08/19/2019   IR CT HEAD LTD  10/02/2019   IR INTRA CRAN STENT  08/19/2019   IR NEURO EACH ADD'L AFTER BASIC UNI LEFT (MS)  08/06/2019   IR TRANSCATH/EMBOLIZ  08/06/2019   IR TRANSCATH/EMBOLIZ  10/02/2019       IR TRANSCATH/EMBOLIZ  10/02/2019   IR US GUIDE VASC ACCESS RIGHT  07/19/2019   IR US GUIDE VASC ACCESS RIGHT  08/06/2019   IR US GUIDE VASC ACCESS RIGHT  08/19/2019   IR US GUIDE VASC ACCESS RIGHT  10/02/2019   IR US GUIDE VASC ACCESS RIGHT  03/17/2020   IR US GUIDE VASC ACCESS RIGHT  10/20/2020   RADIOLOGY WITH ANESTHESIA N/A 08/06/2019   Procedure: IR WITH ANESTHESIA;  Surgeon: Radiologist, Medication, MD;  Location: [place];  Service: Radiology;  Laterality: N/A;   RADIOLOGY WITH ANESTHESIA N/A 08/19/2019   Procedure: STENT PLACEMENT;  Surgeon: Pedro Earls, MD;  Location: [facility];  Service: Radiology;  Laterality: N/A;   RADIOLOGY WITH ANESTHESIA N/A 10/02/2019   Procedure: RADIOLOGY WITH ANESTHESIA  EMBOLIZATION;  Surgeon: Pedro Earls, MD;  Location: [place];  Service: Radiology;  Laterality: N/A;   WISDOM TOOTH EXTRACTION      FAMILY HISTORY Family History  Problem Relation Age of Onset   Aneurysm Father     SOCIAL HISTORY Social History   Tobacco Use   Smoking status: Every Day    Packs/day: 0.25    Types: Cigarettes   Smokeless tobacco: Never  Vaping Use   Vaping Use: Never used  Substance Use Topics   Alcohol use: Yes    Alcohol/week: 10.0 standard drinks    Types: 10 Glasses of wine per week    Comment: one to two glasses or wine per night   Drug use: Not Currently         OPHTHALMIC EXAM:  Base Eye Exam     Visual Acuity (Snellen - Linear)       Right Left   Dist  cc NLP 20/25   Dist ph cc  NI    Correction: Glasses         Tonometry (Tonopen, 9:03 AM)       Right Left   Pressure 14 15         Pupils       Dark  Light Shape React APD   Right 3 5 Round Brisk +3   Left 3 2 Round Brisk None         Visual Fields (Counting fingers)       Left Right    Full    Restrictions  Total superior temporal, inferior temporal, superior nasal, inferior nasal deficiencies         Extraocular Movement       Right Left    RXT     -- -- --  --  --  -- -- --   -- -- --  --  --  -- -- --           Neuro/Psych     Oriented x3: Yes   Mood/Affect: Normal         Dilation     Both eyes: 1.0% Mydriacyl, 2.5% Phenylephrine @ 9:03 AM           Slit Lamp and Fundus Exam     External Exam       Right Left   External Normal Normal         Slit Lamp Exam       Right Left   Lids/Lashes Dermatochalasis - upper lid Dermatochalasis - upper lid   Conjunctiva/Sclera White and quiet White and quiet   Cornea arcus, trace PEE arcus, 1+PEE   Anterior Chamber Deep and quiet Deep and quiet   Iris Round and dilated, No NVI Round and dilated, No NVI   Lens 2+ Nuclear sclerosis, 2+ Cortical cataract 2+ Nuclear sclerosis, 2+ Cortical cataract   Vitreous Vitreous syneresis Vitreous syneresis         Fundus Exam       Right Left   Disc 2+ mild Pallor, Sharp rim Pink and Sharp   C/D Ratio 0.5 0.3   Macula Flat, Blunted foveal reflex, large blot hemes centrally and inferior mac, scattered IRH, ?mild residual retinal whitening Flat, Good foveal reflex, RPE mottling, No heme or edema   Vessels attenuated, Copper wiring, +boxcaring of ST arteriole; remote CRAO, no emboli visible mild attenuation, mild Copper wiring, mild tortuousity   Periphery Attached, pigmented CR scarring temporal periphery   , No heme  Attached              Refraction     Wearing Rx       Sphere Cylinder Axis Add   Right -0.50 +1.00 160 +2.50   Left  -0.75 +2.25 023 +2.50         Manifest Refraction       Sphere Cylinder Axis Dist VA   Right       Left -0.75 +1.75 023 NI            IMAGING AND PROCEDURES  Imaging and Procedures for 12/14/2020  OCT, Retina - OU - Both Eyes       Right Eye Quality was good. Central Foveal Thickness: 217. Progression has no prior data. Findings include abnormal foveal contour, no IRF, no SRF, intraretinal hyper-reflective material, inner retinal atrophy, outer retinal atrophy.   Left Eye Quality was good. Central Foveal Thickness: 300. Progression has no prior data. Findings include normal foveal contour, no IRF, no SRF, vitreomacular adhesion .   Notes *Images captured and stored on drive  Diagnosis / Impression:  OD: CRAO w/ inner retinal hyperreflectivity diffuse inner retinal atrophy and focal ORA OS: NFP; no IRF/SRF   Clinical management:  See below  Abbreviations: NFP - Normal foveal  profile. CME - cystoid macular edema. PED - pigment epithelial detachment. IRF - intraretinal fluid. SRF - subretinal fluid. EZ - ellipsoid zone. ERM - epiretinal membrane. ORA - outer retinal atrophy. ORT - outer retinal tubulation. SRHM - subretinal hyper-reflective material. IRHM - intraretinal hyper-reflective material      Fluorescein Angiography Optos (Transit OD)       Right Eye Progression has no prior data. Early phase findings include delayed filling, vascular perfusion defect, blockage (Delayed filling and venous return greatest temporal periphery, central blockage corresponding to central blot hemes). Mid/Late phase findings include blockage, vascular perfusion defect (Low fluorescein signal, irregular luminal signal within ST arteriole).   Left Eye Progression has no prior data. Early phase findings include normal observations. Mid/Late phase findings include normal observations.   Notes **Images stored on drive**  Impression: OD: CRAO with central blot hemes and irregular  narrowing of ST arteriole OS: normal study            ASSESSMENT/PLAN:    ICD-10-CM   1. CRAO (central retinal artery occlusion), right  H34.11     2. Retinal edema  H35.81 OCT, Retina - OU - Both Eyes    3. Essential hypertension  I10     4. Hypertensive retinopathy of both eyes  H35.033 Fluorescein Angiography Optos (Transit OD)    5. Combined forms of age-related cataract of both eyes  H25.813       1,2. CRAO OD  - acute onset painless vision loss on 8.3.22 - presented to ED where IV TPA was administered - developed subarachnoid heme in bilateral occipital lobes - VA NLP OD - exam shows resolving retinal whitening, no cherry red spot, but large central blot hemes; thready arterioles - OCT shows inner hyperreflectivity and diffuse inner retinal atrophy and focal ORA - discussed findings, guarded prognosis, and possible development of neovascularization and glaucoma ~3 mos post CRAO onset - no retinal or ophthalmic interventions indicated or recommended - f/u 2 months -- DFE/OCT, possible injection  3,4. Hypertensive retinopathy OU - discussed importance of tight BP control - monitor  5. Mixed Cataract OU - The symptoms of cataract, surgical options, and treatments and risks were discussed with patient. - discussed diagnosis and progression - not yet visually significant - monitor for now  Ophthalmic Meds Ordered this visit:  No orders of the defined types were placed in this encounter.     Return in about 2 months (around 02/13/2021) for f/u CRAO OD, DFE, OCT.  There are no Patient Instructions on file for this visit.   Explained the diagnoses, plan, and follow up with the patient and they expressed understanding.  Patient expressed understanding of the importance of proper follow up care.   This document serves as a record of services personally performed by Gardiner Sleeper, MD, PhD. It was created on their behalf by San Jetty. Owens Shark, OA an ophthalmic  technician. The creation of this record is the provider's dictation and/or activities during the visit.    Electronically signed by: San Jetty. Marguerita Merles 09.12.2022 10:44 PM   Gardiner Sleeper, M.D., Ph.D. Diseases & Surgery of the Retina and Vitreous [facility]  I have reviewed the above documentation for accuracy and completeness, and I agree with the above. Gardiner Sleeper, M.D., Ph.D. 12/15/20 10:44 PM  Abbreviations: M myopia (nearsighted); A astigmatism; H hyperopia (farsighted); P presbyopia; Mrx spectacle prescription;  CTL contact lenses; OD right eye; OS left eye; OU both eyes  XT exotropia; ET esotropia; PEK punctate epithelial keratitis; PEE punctate epithelial erosions; DES dry eye syndrome; MGD meibomian gland dysfunction; ATs artificial tears; PFAT's preservative free artificial tears; [place]bell nuclear sclerotic cataract; PSC posterior subcapsular cataract; ERM epi-retinal membrane; PVD posterior vitreous detachment; RD retinal detachment; DM diabetes mellitus; DR diabetic retinopathy; NPDR non-proliferative diabetic retinopathy; PDR proliferative diabetic retinopathy; CSME clinically significant macular edema; DME diabetic macular edema; dbh dot blot hemorrhages; CWS cotton wool spot; POAG primary open angle glaucoma; C/D cup-to-disc ratio; HVF humphrey visual field; GVF goldmann visual field; OCT optical coherence tomography; IOP intraocular pressure; BRVO Branch retinal vein occlusion; CRVO central retinal vein occlusion; CRAO central retinal artery occlusion; BRAO branch retinal artery occlusion; RT retinal tear; SB scleral buckle; PPV pars plana vitrectomy; VH Vitreous hemorrhage; PRP panretinal laser photocoagulation; IVK intravitreal kenalog; VMT vitreomacular traction; MH Macular hole;  NVD neovascularization of the disc; NVE neovascularization elsewhere; AREDS age related eye disease study; ARMD age related macular degeneration; POAG primary open angle glaucoma;  EBMD epithelial/anterior basement membrane dystrophy; ACIOL anterior chamber intraocular lens; IOL intraocular lens; PCIOL posterior chamber intraocular lens; Phaco/IOL phacoemulsification with intraocular lens placement; [place] photorefractive keratectomy; LASIK laser assisted in situ keratomileusis; HTN hypertension; DM diabetes mellitus; COPD chronic obstructive pulmonary disease

## 2020-12-14 ENCOUNTER — Ambulatory Visit (INDEPENDENT_AMBULATORY_CARE_PROVIDER_SITE_OTHER): Payer: Medicare Other | Admitting: Ophthalmology

## 2020-12-14 ENCOUNTER — Encounter (INDEPENDENT_AMBULATORY_CARE_PROVIDER_SITE_OTHER): Payer: Self-pay | Admitting: Ophthalmology

## 2020-12-14 ENCOUNTER — Other Ambulatory Visit: Payer: Self-pay

## 2020-12-14 DIAGNOSIS — H25813 Combined forms of age-related cataract, bilateral: Secondary | ICD-10-CM | POA: Diagnosis not present

## 2020-12-14 DIAGNOSIS — H3411 Central retinal artery occlusion, right eye: Secondary | ICD-10-CM

## 2020-12-14 DIAGNOSIS — I10 Essential (primary) hypertension: Secondary | ICD-10-CM

## 2020-12-14 DIAGNOSIS — H35033 Hypertensive retinopathy, bilateral: Secondary | ICD-10-CM

## 2020-12-14 DIAGNOSIS — H3581 Retinal edema: Secondary | ICD-10-CM

## 2020-12-28 ENCOUNTER — Ambulatory Visit (HOSPITAL_BASED_OUTPATIENT_CLINIC_OR_DEPARTMENT_OTHER): Payer: Medicare Other | Admitting: Cardiovascular Disease

## 2021-01-07 ENCOUNTER — Other Ambulatory Visit: Payer: Self-pay

## 2021-01-07 ENCOUNTER — Ambulatory Visit: Payer: Medicare Other | Admitting: Neurology

## 2021-01-07 ENCOUNTER — Encounter: Payer: Self-pay | Admitting: Neurology

## 2021-01-07 VITALS — BP 157/84 | HR 77 | Ht 71.0 in | Wt 194.0 lb

## 2021-01-07 DIAGNOSIS — H3411 Central retinal artery occlusion, right eye: Secondary | ICD-10-CM | POA: Diagnosis not present

## 2021-01-07 DIAGNOSIS — Z9889 Other specified postprocedural states: Secondary | ICD-10-CM | POA: Diagnosis not present

## 2021-01-07 DIAGNOSIS — Z8679 Personal history of other diseases of the circulatory system: Secondary | ICD-10-CM

## 2021-01-07 NOTE — Progress Notes (Signed)
[facility] [address] [place]. [place] [address] [phone]       OFFICE FOLLOW-UP NOTE  Victor Pena Date of Birth:  September 20, 1955 Medical Record Number:  [id number]   HPI: Initial visit 10/16/2019 [name] is a pleasant 65 year old Caucasian male seen today for initial office follow-up visit following hospital consultation for stroke in May 2021.  History is obtained from the patient, review of electronic medical records and I personally reviewed available imaging films in PACS.  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  He was scheduled to undergo elective treatment of the right-sided aneurysm but presented to [facility] on 08/06/2019 as a code stroke with sudden onset of transient expressive language difficulties lasting 5 minutes with recurrent episode lasting 2 minutes.  He also noticed problems using his right hand with dropping cups and lack of dexterity.  [name] also on inquiry admitted to mild left occipital region headache.  CT scan of the head showed left frontal convexity localized subarachnoid hemorrhage likely from leaking left MCA bifurcation aneurysm.  He underwent balloon assisted coiling of leaking left MCA aneurysm without incident.  Brilinta was discontinued.  He will doing well until he had a transient episode of worsening aphasia on 08/16/2019 and CT scan showed new punctate infarct in the left posterior temporal region likely related to vasospasm versus platelet aggregation at the coil mass interface given the wide mechanism.  He was restarted on Brilinta at that time.  He had follow-up diagnostic cerebral angiogram on 08/19/2019 that showed unimpeded  flow in the left MCA branches and there was a suggestion of minimum platelet aggregation at the coil mass artery interface and patient then underwent elective left MCA stent across the neck of the aneurysm.  He was discharged home and did well.  He came back subsequently on 10/02/2019 for right ICA aneurysm treatment with pipeline stent which was uneventful.  Patient states that his speech has recovered completely.  Right hand dexterity is also good.  He remains on aspirin and Brilinta and is tolerating well with only minor bruising and no bleeding.  He had serial TCD's in the hospital to watch for vasospasms which were uneventful.  EEG x2 was negative for seizures.  LDL cholesterol was elevated 149 mg percent.  IMA globin A1c was 5.2.  Patient has family history of aneurysms and his dad died of subarachnoid hemorrhage.  Patient states his blood pressure is better at home though today it is elevated in office at 151/82.  He knows he needs to quit smoking and he has cut back but is still smoking. Update 03/30/2020: He returns for follow-up after last visit 5 months ago.  He states he has no new neurological issues.  He denies any headache, focal extremity weakness numbness gait or balance problems.  He underwent diagnostic cerebral catheter angiogram for aneurysm surveillance by Dr. Corliss Skains on 03/17/2020 which shows a residual 3.2 x 2.1 mm aneurysm arising at the site of the previously treated right ICA ophthalmic aneurysm.  There is also 50% stenosis in the stent assisted aneurysm coiling of the left MCA bifurcation and is in.  Patient is currently on aspirin and Brilinta and seems to be tolerating it well  but did get easy bruising.  He climbed this ladder a few weeks ago and got some minor injury to in his thigh which have led to nasty bruise.  He states her blood pressures well controlled today it is slightly borderline in office at 140/80.  He continues to smoke but knows he needs to quit.  He has no new  complaints today. Update 01/07/2021 : He returns for follow-up after last visit 10 months ago.  Patient and unfortunately had sudden onset of painless vision loss in the right eye on 11/04/2020.  Presented to [facility] and was given IV tPA but without improvement.  Postprocedure CT scan showed small areas of subarachnoid hemorrhages over both occipital lobes.  Follow-up imaging showed stable appearance of this.  2D echo showed normal ejection fraction without cardiac source of embolism.  CT angiogram of the brain and neck showed no large vessel stenosis or occlusion.  Patent right ICA flow diverting stent with small residual right paraophthalmic artery aneurysm seen on recent catheter angiogram not seen on CTA.  Coil left MCA bifurcation aneurysm with associated artifact.  CT venogram was unremarkable.  LDL cholesterol was at goal at 68 mg percent and hemoglobin A1c was 5.5.  Patient was started on aspirin alone due to subarachnoid hemorrhage and states his vision is unfortunately not improved at all.  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  He still able to see well with the left eye and is able to drive carefully short distances.  [name] able to play golf and is back to work.  His blood pressure normally is well controlled at home though it is slightly elevated in office today at 157/84.  Is tolerating Lipitor well without any side effects.  Tolerating aspirin without bleeding or bruising.  No new complaints today. ROS:   14 system review of systems is positive for vision loss, minor bruising and no other complaints all systems negative  PMH:  Past Medical History:  Diagnosis Date   Cerebral aneurysm    Diplopia    Intracranial aneurysm    Seasonal allergies    Stroke [name])    Wears glasses     Social History:  Social History   Socioeconomic History   Marital status: Married    Spouse name: Not on file   Number of children: 4   Years of education: 16   Highest education level: Not  on file  Occupational History   Occupation: owner   Tobacco Use   Smoking status: Former    Packs/day: 0.25    Types: Cigarettes   Smokeless tobacco: Never  Vaping Use   Vaping Use: Never used  Substance and Sexual Activity   Alcohol use: Yes    Alcohol/week: 10.0 standard drinks    Types: 10 Glasses of wine per week    Comment: one to two glasses or wine per night   Drug use: Not Currently   Sexual activity: Not on file  Other Topics Concern   Not on file  Social History Narrative   Lives at home with wife and 2 daughters   Right handed   Drinks 4-5 cups caffeine daily   Social Determinants of Health   Financial Resource Strain: Not on file  Food Insecurity: Not on file  Transportation Needs: Not on file  Physical Activity: Not on file  Stress: Not on file  Social Connections: Not on file  Intimate Partner Violence: Not on file    Medications:  Current Outpatient Medications on File Prior to Visit  Medication Sig Dispense Refill   acetaminophen (TYLENOL) 500 MG tablet Take 1,000 mg by mouth every 6 (six) hours as needed (Back pain).     aspirin EC 81 MG tablet Take 81 mg by mouth daily. Swallow whole.     atorvastatin (LIPITOR) 40 MG tablet TAKE 1 TABLET BY MOUTH EVERY DAY 90 tablet 1   No current facility-administered medications on file prior to visit.    Allergies:   Allergies  Allergen Reactions   Other Other (See Comments)    seasonal    Physical Exam General: well developed, well nourished middle-aged Caucasian male, seated, in no evident distress Head: head normocephalic and atraumatic.  Neck: supple with no carotid or supraclavicular bruits Cardiovascular: regular rate and rhythm, no murmurs Musculoskeletal: no deformity Skin:  no rash/petichiae Vascular:  Normal pulses all extremities Vitals:   01/07/21 1443  BP: (!) 157/84  Pulse: 77   Neurologic Exam Mental Status: Awake and fully alert. Oriented to place and time. Recent and remote  memory intact. Attention span, concentration and fund of knowledge appropriate. Mood and affect appropriate.  Cranial Nerves: Fundoscopic exam shows optic pallor on the right disc..  Right pupil is 3 mm with afferent pupillary defect.  There is no light perception in the right eye.  Left pupil reacts normally with good vision in the left eye.Marland Kitchen Extraocular movements full without nystagmus. Visual fields full to confrontation. Hearing intact. Facial sensation intact. Face, tongue, palate moves normally and symmetrically.  Motor: Normal bulk and tone. Normal strength in all tested extremity muscles. Sensory.: intact to touch ,pinprick .position and vibratory sensation.  Coordination: Rapid alternating movements normal in all extremities. Finger-to-nose and heel-to-shin performed accurately bilaterally. Gait and Station: Arises from chair without difficulty. Stance is normal. Gait demonstrates normal stride length and balance . Able to heel, toe and tandem walk without difficulty.  Reflexes: 1+ and symmetric. Toes downgoing.       ASSESSMENT: 65 year old Caucasian male with sentinel leak from left MCA bifurcation aneurysm in May 2021 s/p endovascular coiling followed by left MCA stent and later on interval treatment of right ICA aneurysm with pipeline embolization device in June 2021.  Initial left MCA aneurysm treatment complicated by small left MCA branch infarcts but he is made good neurological recovery with practically no deficits.  Vascular risk factors of smoking, hyperlipidemia and borderline hypertension.  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  Negative neurovascular work-up.    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  Continue aspirin for stroke prevention and maintain aggressive risk factor modification with strict control of hypertension and blood pressure goal below 140/90, lipids with LDL cholesterol goal below 70 mg percent and diabetes with hemoglobin A1c goal below 6.5%.  He was also encouraged to quit smoking.  I also recommend that he wear the 30-day outpatient cardiac monitoring recorder that he has just received for paroxysmal A. fib.  Continue follow-up for his cerebral aneurysm s/p stent assisted coiling with interventional radiology.  Return for follow-up to see me in 6 months or call earlier if necessary. Greater than 50% of time during this 30 minute visit was spent on counseling,explanation of diagnosis of retinal artery occlusion, subarachnoid hemorrhage, cerebral aneurysms and strokes, planning of further management, discussion with patient and family and coordination of care Delia Heady, MD  [facility] [address] [place], [place] [address]  Phone [phone] Fax [phone] Note: This document was prepared with digital dictation and possible smart phrase technology. Any transcriptional errors that result from this process are unintentional

## 2021-01-07 NOTE — Patient Instructions (Addendum)
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  Continue aspirin for stroke prevention and maintain aggressive risk factor modification with strict control of hypertension and blood pressure goal below 140/90, lipids with LDL cholesterol goal below 70 mg percent and diabetes with hemoglobin A1c goal below 6.5%.  He was also encouraged to quit smoking.  I also recommend that he wear the 30-day outpatient cardiac monitoring recorder that he has just received for paroxysmal A. fib.  Continue follow-up for his cerebral aneurysm s/p stent assisted coiling with interventional radiology.  Return for follow-up to see me in 6 months or call earlier if necessary. Stroke Prevention Some medical conditions and behaviors are associated with a higher chance of having a stroke. You can help prevent a stroke by making nutrition, lifestyle, and other changes, including managing any medical conditions you may have. What nutrition changes can be made?  Eat healthy foods. You can do this by: Choosing foods high in fiber, such as fresh fruits and vegetables and whole grains. Eating at least 5 or more servings of fruits and vegetables a day. Try to fill half of your plate at each meal with fruits and vegetables. Choosing lean protein foods, such as lean cuts of meat, poultry without skin, fish, tofu, beans, and nuts. Eating low-fat dairy products. Avoiding foods that are high in salt (sodium). This can help lower blood pressure. Avoiding foods that have saturated fat, trans fat, and cholesterol. This can help prevent high cholesterol. Avoiding processed and premade foods. Follow your health care provider's specific guidelines for losing weight, controlling high blood pressure (hypertension), lowering high cholesterol, and managing diabetes. These may include: Reducing your daily calorie  intake. Limiting your daily sodium intake to 1,500 milligrams (mg). Using only healthy fats for cooking, such as olive oil, canola oil, or sunflower oil. Counting your daily carbohydrate intake. What lifestyle changes can be made? Maintain a healthy weight. Talk to your health care provider about your ideal weight. Get at least 30 minutes of moderate physical activity at least 5 days a week. Moderate activity includes brisk walking, biking, and swimming. Do not use any products that contain nicotine or tobacco, such as cigarettes and e-cigarettes. If you need help quitting, ask your health care provider. It may also be helpful to avoid exposure to secondhand smoke. Limit alcohol intake to no more than 1 drink a day for nonpregnant women and 2 drinks a day for men. One drink equals 12 oz of beer, 5 oz of wine, or 1 oz of hard liquor. Stop any illegal drug use. Avoid taking birth control pills. Talk to your health care provider about the risks of taking birth control pills if: You are over 66 years old. You smoke. You get migraines. You have ever had a blood clot. What other changes can be made? Manage your cholesterol levels. Eating a healthy diet is important for preventing high cholesterol. If cholesterol cannot be managed through diet alone, you may also need to take medicines. Take any prescribed medicines to control your cholesterol as told by your health care provider. Manage your diabetes. Eating a healthy diet and exercising regularly are important parts of managing your blood sugar. If your blood sugar cannot be managed through diet and exercise, you may need to take medicines. Take any prescribed medicines to control your diabetes as told by your health care provider. Control  your hypertension. To reduce your risk of stroke, try to keep your blood pressure below 130/80. Eating a healthy diet and exercising regularly are an important part of controlling your blood pressure. If your  blood pressure cannot be managed through diet and exercise, you may need to take medicines. Take any prescribed medicines to control hypertension as told by your health care provider. Ask your health care provider if you should monitor your blood pressure at home. Have your blood pressure checked every year, even if your blood pressure is normal. Blood pressure increases with age and some medical conditions. Get evaluated for sleep disorders (sleep apnea). Talk to your health care provider about getting a sleep evaluation if you snore a lot or have excessive sleepiness. Take over-the-counter and prescription medicines only as told by your health care provider. Aspirin or blood thinners (antiplatelets or anticoagulants) may be recommended to reduce your risk of forming blood clots that can lead to stroke. Make sure that any other medical conditions you have, such as atrial fibrillation or atherosclerosis, are managed. What are the warning signs of a stroke? The warning signs of a stroke can be easily remembered as BEFAST. B is for balance. Signs include: Dizziness. Loss of balance or coordination. Sudden trouble walking. E is for eyes. Signs include: A sudden change in vision. Trouble seeing. F is for face. Signs include: Sudden weakness or numbness of the face. The face or eyelid drooping to one side. A is for arms. Signs include: Sudden weakness or numbness of the arm, usually on one side of the body. S is for speech. Signs include: Trouble speaking (aphasia). Trouble understanding. T is for time. These symptoms may represent a serious problem that is an emergency. Do not wait to see if the symptoms will go away. Get medical help right away. Call your local emergency services (911 in the [place] Do not drive yourself to the hospital. Other signs of stroke may include: A sudden, severe headache with no known cause. Nausea or vomiting. Seizure. Where to find more information For more  information, visit: American Stroke Association: www.strokeassociation.org National Stroke Association: www.stroke.org Summary You can prevent a stroke by eating healthy, exercising, not smoking, limiting alcohol intake, and managing any medical conditions you may have. Do not use any products that contain nicotine or tobacco, such as cigarettes and e-cigarettes. If you need help quitting, ask your health care provider. It may also be helpful to avoid exposure to secondhand smoke. Remember BEFAST for warning signs of stroke. Get help right away if you or a loved one has any of these signs. This information is not intended to replace advice given to you by your health care provider. Make sure you discuss any questions you have with your health care provider. Document Revised: 03/03/2017 Document Reviewed: 04/26/2016 Elsevier Patient Education  2021 [employer].

## 2021-02-03 ENCOUNTER — Inpatient Hospital Stay: Payer: Medicare Other | Admitting: Neurology

## 2021-02-04 ENCOUNTER — Telehealth: Payer: Self-pay | Admitting: *Deleted

## 2021-02-04 NOTE — Telephone Encounter (Signed)
Patient was enrolled 11/11/20 for [employer] to ship a 30 day cardiac event monitor to his home. A letter was mailed to patient which stated [employer] would contact him to verify a shipping address.  The phone number for [employer] was also listed on that letter. On 02/04/21 [employer] website no longer had patient listed.  [name], [employer] representative, was contacted to inquire why enrollment was cancelled.   [employer] had attempted to contact the patient to confirm a shipping address.  The patient did not answer calls. If monitor still desired , another enrollment would be necessary.

## 2021-02-09 ENCOUNTER — Encounter: Payer: Self-pay | Admitting: *Deleted

## 2021-02-09 ENCOUNTER — Other Ambulatory Visit: Payer: Self-pay

## 2021-02-09 ENCOUNTER — Ambulatory Visit (HOSPITAL_BASED_OUTPATIENT_CLINIC_OR_DEPARTMENT_OTHER): Payer: Medicare Other | Admitting: Cardiovascular Disease

## 2021-02-09 ENCOUNTER — Encounter (HOSPITAL_BASED_OUTPATIENT_CLINIC_OR_DEPARTMENT_OTHER): Payer: Self-pay | Admitting: Cardiovascular Disease

## 2021-02-09 DIAGNOSIS — H3411 Central retinal artery occlusion, right eye: Secondary | ICD-10-CM

## 2021-02-09 DIAGNOSIS — F17210 Nicotine dependence, cigarettes, uncomplicated: Secondary | ICD-10-CM

## 2021-02-09 DIAGNOSIS — I10 Essential (primary) hypertension: Secondary | ICD-10-CM

## 2021-02-09 HISTORY — DX: Essential (primary) hypertension: I10

## 2021-02-09 NOTE — Patient Instructions (Signed)
Medication Instructions:  Your physician recommends that you continue on your current medications as directed. Please refer to the Current Medication list given to you today.   *If you need a refill on your cardiac medications before your next appointment, please call your pharmacy*  Lab Work: NONE   Testing/Procedures: Your physician has recommended that you wear an event monitor. Event monitors are medical devices that record the heart's electrical activity. Doctors most often [place] these monitors to diagnose arrhythmias. Arrhythmias are problems with the speed or rhythm of the heartbeat. The monitor is a small, portable device. You can wear one while you do your normal daily activities. This is usually used to diagnose what is causing palpitations/syncope (passing out).  Follow-Up: At [facility], you and your health needs are our priority.  As part of our continuing mission to provide you with exceptional heart care, we have created designated Provider Care Teams.  These Care Teams include your primary Cardiologist (physician) and Advanced Practice Providers (APPs -  Physician Assistants and Nurse Practitioners) who all work together to provide you with the care you need, when you need it.  We recommend signing up for the patient portal called "MyChart".  Sign up information is provided on this After Visit Summary.  MyChart is used to connect with patients for Virtual Visits (Telemedicine).  Patients are able to view lab/test results, encounter notes, upcoming appointments, etc.  Non-urgent messages can be sent to your provider as well.   To learn more about what you can do with MyChart, go to [email or web address].    Your next appointment:   6 month(s)  The format for your next appointment:   In Person  Provider:   Chilton Si, MD   Your physician recommends that you schedule a follow-up appointment in: PHARM D IN 2 MONTHS   Other Instructions MONITOR YOUR BLOOD PRESSURE  DAILY  BRING YOUR READINGS AND MACHINE TO YOUR FOLLOW UP APPOINTMENT   [employer] Cardiac Event Monitor Instructions Your physician has requested you wear your cardiac event monitor for __30__ days, (1-30). [employer] may call or text to confirm a shipping address. The monitor will be sent to a land address via UPS. [employer] will not ship a monitor to a PO BOX. It typically takes 3-5 days to receive your monitor after it has been enrolled. [employer] will assist with USPS tracking if your package is delayed. The telephone number for [employer] is [phone]. Once you have received your monitor, please review the enclosed instructions. Instruction tutorials can also be viewed under help and settings on the enclosed cell phone. Your monitor has already been registered assigning a specific monitor serial # to you.  Applying the monitor Remove cell phone from case and turn it on. The cell phone works as IT consultant and needs to be within [employer] of you at all times. The cell phone will need to be charged on a daily basis. We recommend you plug the cell phone into the enclosed charger at your bedside table every night.  Monitor batteries: You will receive two monitor batteries labelled #1 and #2. These are your recorders. Plug battery #2 onto the second connection on the enclosed charger. Keep one battery on the charger at all times. This will keep the monitor battery deactivated. It will also keep it fully charged for when you need to switch your monitor batteries. A small light will be blinking on the battery emblem when it is charging. The light on the battery emblem will remain  on when the battery is fully charged.  Open package of a Monitor strip. Insert battery #1 into black hood on strip and gently squeeze monitor battery onto connection as indicated in instruction booklet. Set aside while preparing skin.  Choose location for your strip, vertical or horizontal, as indicated in  the instruction booklet. Shave to remove all hair from location. There cannot be any lotions, oils, powders, or colognes on skin where monitor is to be applied. Wipe skin clean with enclosed Saline wipe. Dry skin completely.  Peel paper labeled #1 off the back of the Monitor strip exposing the adhesive. Place the monitor on the chest in the vertical or horizontal position shown in the instruction booklet. One arrow on the monitor strip must be pointing upward. Carefully remove paper labeled #2, attaching remainder of strip to your skin. Try not to create any folds or wrinkles in the strip as you apply it.  Firmly press and release the circle in the center of the monitor battery. You will hear a small beep. This is turning the monitor battery on. The heart emblem on the monitor battery will light up every 5 seconds if the monitor battery in turned on and connected to the patient securely. Do not push and hold the circle down as this turns the monitor battery off. The cell phone will locate the monitor battery. A screen will appear on the cell phone checking the connection of your monitor strip. This may read poor connection initially but change to good connection within the next minute. Once your monitor accepts the connection you will hear a series of 3 beeps followed by a climbing crescendo of beeps. A screen will appear on the cell phone showing the two monitor strip placement options. Touch the picture that demonstrates where you applied the monitor strip.  Your monitor strip and battery are waterproof. You are able to shower, bathe, or swim with the monitor on. They just ask you do not submerge deeper than 3 feet underwater. We recommend removing the monitor if you are swimming in a lake, river, or ocean.  Your monitor battery will need to be switched to a fully charged monitor battery approximately once a week. The cell phone will alert you of an action which needs to be made.  On the  cell phone, tap for details to reveal connection status, monitor battery status, and cell phone battery status. The green dots indicates your monitor is in good status. A red dot indicates there is something that needs your attention.  To record a symptom, click the circle on the monitor battery. In 30-60 seconds a list of symptoms will appear on the cell phone. Select your symptom and tap save. Your monitor will record a sustained or significant arrhythmia regardless of you clicking the button. Some patients do not feel the heart rhythm irregularities. [employer] will notify us of any serious or critical events.  Refer to instruction booklet for instructions on switching batteries, changing strips, the Do not disturb or Pause features, or any additional questions.  Call [employer] at [phone], to confirm your monitor is transmitting and record your baseline. They will answer any questions you may have regarding the monitor instructions at that time.  Returning the monitor to [employer] Place all equipment back into blue box. Peel off strip of paper to expose adhesive and close box securely. There is a prepaid UPS shipping label on this box. Drop in a UPS drop box, or at a UPS facility like [employer]. You  may also contact [employer] to arrange UPS to pick up monitor package at your home.

## 2021-02-09 NOTE — Progress Notes (Signed)
Cardiology Office Note:    Date:  02/09/2021   ID:  Victor Pena, DOB [birth year], MRN [id number]  PCP:  Victor Loader, FNP   [facility] Providers Cardiologist:  None     Referring MD: Victor Loader, FNP   No chief complaint on file.   History of Present Illness:    Victor Pena is a 65 y.o. male with a hx of stroke, intracranial aneurysm, and tobacco abuse. 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 He received TPA and had a SAH. He was stable and did not require intervention. Echo at admission revealed LVEF 55-60% with grade 1 diastolic dysfunction. 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 A 30-day monitor was ordered 11/06/2020 by neurology to evaluate for etiology of CRAO. The monitor was not completed, and a new enrollment in [employer] will be needed.   Today he is feeling good overall, and he has returned to work. Occasionally he feels fatigued which he attributes to old age. He reports complete loss of vision in his right eye. [name] in the process of replacing his blood pressure cuff, so he has not monitored his blood pressure lately. His blood pressure was in the [id number] systolic when he last saw his neurologist. For activity he plays golf occasionally and completes yard work, but he does not formally exercise. For his diet he tries to eat reasonably well. [name] a former smoker, and quit 11/04/2020. However, he had a few relapses. Currently he is using nicotine gum to assist with quitting. He denies any palpitations, chest pain, or shortness of breath. No lightheadedness, headaches, syncope, orthopnea, PND, or lower extremity edema.   Past Medical History:  Diagnosis Date   Cerebral aneurysm    Diplopia    Essential hypertension 02/09/2021   Intracranial aneurysm    Seasonal allergies    Stroke [facility])    Wears glasses     Past Surgical History:  Procedure Laterality Date   BACK SURGERY      COLONOSCOPY     IR 3D INDEPENDENT [facility]ST  07/19/2019   IR 3D INDEPENDENT [facility]ST  07/19/2019   IR 3D INDEPENDENT [facility]ST  08/06/2019   IR 3D INDEPENDENT [facility]ST  08/19/2019   IR 3D INDEPENDENT [facility]ST  10/02/2019   IR ANGIO EXTERNAL CAROTID SEL EXT CAROTID BILAT MOD SED  07/19/2019   IR ANGIO INTRA EXTRACRAN SEL COM CAROTID INNOMINATE BILAT MOD SED  03/17/2020   IR ANGIO INTRA EXTRACRAN SEL INTERNAL CAROTID BILAT MOD SED  07/19/2019   IR ANGIO INTRA EXTRACRAN SEL INTERNAL CAROTID BILAT MOD SED  10/02/2019   IR ANGIO INTRA EXTRACRAN SEL INTERNAL CAROTID BILAT MOD SED  10/20/2020   IR ANGIO VERTEBRAL SEL SUBCLAVIAN INNOMINATE UNI L MOD SED  03/17/2020   IR ANGIO VERTEBRAL SEL VERTEBRAL BILAT MOD SED  07/19/2019   IR ANGIO VERTEBRAL SEL VERTEBRAL BILAT MOD SED  10/20/2020   IR ANGIO VERTEBRAL SEL VERTEBRAL UNI R MOD SED  10/02/2019   IR ANGIO VERTEBRAL SEL VERTEBRAL UNI R MOD SED  03/17/2020   IR ANGIOGRAM FOLLOW UP STUDY  08/06/2019   IR ANGIOGRAM FOLLOW UP STUDY  08/06/2019   IR ANGIOGRAM FOLLOW UP STUDY  08/06/2019   IR ANGIOGRAM FOLLOW UP STUDY  08/06/2019   IR ANGIOGRAM FOLLOW UP STUDY  08/06/2019   IR ANGIOGRAM FOLLOW UP STUDY  08/06/2019   IR ANGIOGRAM FOLLOW UP STUDY  10/02/2019  IR CT HEAD LTD  08/06/2019   IR CT HEAD LTD  08/19/2019   IR CT HEAD LTD  10/02/2019   IR INTRA CRAN STENT  08/19/2019   IR NEURO EACH ADD'L AFTER BASIC UNI LEFT (MS)  08/06/2019   IR TRANSCATH/EMBOLIZ  08/06/2019   IR TRANSCATH/EMBOLIZ  10/02/2019       IR TRANSCATH/EMBOLIZ  10/02/2019   IR US GUIDE VASC ACCESS RIGHT  07/19/2019   IR US GUIDE VASC ACCESS RIGHT  08/06/2019   IR US GUIDE VASC ACCESS RIGHT  08/19/2019   IR US GUIDE VASC ACCESS RIGHT  10/02/2019   IR US GUIDE VASC ACCESS RIGHT  03/17/2020   IR US GUIDE VASC ACCESS RIGHT  10/20/2020   RADIOLOGY WITH ANESTHESIA N/A 08/06/2019   Procedure: IR WITH ANESTHESIA;  Surgeon: Radiologist, Medication, MD;  Location: [facility] OR;  Service: Radiology;  Laterality: N/A;   RADIOLOGY WITH ANESTHESIA N/A  08/19/2019   Procedure: STENT PLACEMENT;  Surgeon: Victor Lenis, MD;  Location: [facility];  Service: Radiology;  Laterality: N/A;   RADIOLOGY WITH ANESTHESIA N/A 10/02/2019   Procedure: RADIOLOGY WITH ANESTHESIA  EMBOLIZATION;  Surgeon: Victor Lenis, MD;  Location: [facility] OR;  Service: Radiology;  Laterality: N/A;   WISDOM TOOTH EXTRACTION      Current Medications: Current Meds  Medication Sig   acetaminophen (TYLENOL) 500 MG tablet Take 1,000 mg by mouth every 6 (six) hours as needed (Back pain).   aspirin EC 81 MG tablet Take 81 mg by mouth daily. Swallow whole.   atorvastatin (LIPITOR) 40 MG tablet TAKE 1 TABLET BY MOUTH EVERY DAY     Allergies:   Other   Social History   Socioeconomic History   Marital status: Married    Spouse name: Not on file   Number of children: 4   Years of education: 16   Highest education level: Not on file  Occupational History   Occupation: owner   Tobacco Use   Smoking status: Former    Packs/day: 0.25    Types: Cigarettes   Smokeless tobacco: Never  Vaping Use   Vaping Use: Never used  Substance and Sexual Activity   Alcohol use: Yes    Alcohol/week: 10.0 Pena drinks    Types: 10 Glasses of wine per week    Comment: one to two glasses or wine per night   Drug use: Not Currently   Sexual activity: Not on file  Other Topics Concern   Not on file  Social History Narrative   Lives at home with wife and 2 daughters   Right handed   Drinks 4-5 cups caffeine daily   Social Determinants of Health   Financial Resource Strain: Low Risk    Difficulty of Paying Living Expenses: Not hard at all  Food Insecurity: No Food Insecurity   Worried About Programme researcher, broadcasting/film/video in the Last Year: Never true   Barista in the Last Year: Never true  Transportation Needs: Not on file  Physical Activity: Inactive   Days of Exercise per Week: 0 days   Minutes of Exercise per Session: 0 min  Stress: Not on file  Social  Connections: Not on file     Family History: The patient's family history includes Aneurysm in his father; Hypertension in his father.  ROS:   Please see the history of present illness.    (+) Loss of vision, right eye (+) Fatigue All other systems reviewed and are  negative.  EKGs/Labs/Other Studies Reviewed:    The following studies were reviewed today:  Echo 11/05/2020:  1. Left ventricular ejection fraction, by estimation, is 55 to 60%. The  left ventricle has normal function. The left ventricle has no regional  wall motion abnormalities. Left ventricular diastolic parameters are  consistent with Grade I diastolic  dysfunction (impaired relaxation).   2. Right ventricular systolic function is normal. The right ventricular  size is normal. There is normal pulmonary artery systolic pressure. The  estimated right ventricular systolic pressure is [id number] mmHg.   3. The mitral valve is normal in structure. Trivial mitral valve  regurgitation. No evidence of mitral stenosis.   4. The aortic valve is tricuspid. Aortic valve regurgitation is not  visualized. No aortic stenosis is present.   5. The inferior vena cava is normal in size with greater than 50%  respiratory variability, suggesting right atrial pressure of 3 mmHg.  CT Venogram Head 11/04/2020: FINDINGS: Superior sagittal sinus enhances normally and is widely patent. Internal cerebral veins and straight sinus widely patent. The transverse sinus and sigmoid sinus is patent bilaterally. There are small lead filling defects in the lateral transverse sinus bilaterally which are symmetric and likely due to arachnoid granulation. If this were thrombus it would be small and not likely to be the cause of the patient's symptoms.   Ventricle size remains normal. Occipital subarachnoid hemorrhage again noted. Rounded parenchymal hemorrhage in the occipital lobes bilaterally right greater than left similar to the recent CT from today.  No new area of hemorrhage.   Aneurysm coiling and stenting left MCA. Flow diverting stent right cavernous carotid.   IMPRESSION: Small filling defects in the lateral transverse sinus bilaterally, likely arachnoid granulation rather than small areas of thrombus. Otherwise normal enhancement of the dural venous sinuses.   Subarachnoid and parenchymal hemorrhage in the occipital lobe bilaterally similar to the CT earlier today.  CTA Head and Neck 11/04/2020: FINDINGS: CTA NECK   Aortic arch: Mild mixed plaque along the arch and patent great vessel origins.   Right carotid system: Patent. Mild calcified plaque at the ICA origin without stenosis.   Left carotid system: Patent. Minor plaque at the ICA origin without stenosis.   Vertebral arteries: Patent and codominant.  No stenosis.   Skeleton: No acute osseous abnormality.   Other neck: Unremarkable.   Upper chest: Included upper lungs are clear.   Review of the MIP images confirms the above findings   CTA HEAD   Anterior circulation: Intracranial internal carotid arteries are patent. Minor calcified plaque is present. There is also mild focal noncalcified plaque along the floor of the cavernous left ICA. A patent stent spans the distal cavernous right ICA to the distal supraclinoid portion. Residual filling of the paraophthalmic aneurysm by catheter angiogram is not evident. Right middle cerebral and both anterior cerebral arteries are patent. Stent markers are seen at the left M1 MCA origin and proximal M2 MCA posterior division. There is streak artifact from coiled MCA bifurcation aneurysm. Stent appears patent within this limitation. No apparent recurrent aneurysm.   Posterior circulation: Intracranial vertebral arteries patent. Basilar artery is patent. Major cerebellar artery origins are patent. Right posterior communicating artery is present. Posterior cerebral arteries are patent.   Venous sinuses: Patent as  allowed by contrast bolus timing.   Review of the MIP images confirms the above findings   IMPRESSION: No large vessel occlusion.   Intracranial right ICA flow diverting stent is patent. 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 Within this limitation, patent left M1-M2 MCA stent and no apparent recurrent aneurysm.  EKG:    02/09/2021: Sinus rhythm. Rate 77 bpm.  Recent Labs: 11/04/2020: ALT 28; BUN 12; Creatinine, Ser 0.88; Hemoglobin 13.9; Platelets 258; Potassium 4.3; Sodium 136   Recent Lipid Panel    Component Value Date/Time   CHOL 131 11/04/2020 1209   TRIG 33 11/04/2020 1209   HDL 56 11/04/2020 1209   CHOLHDL 2.3 11/04/2020 1209   VLDL 7 11/04/2020 1209   LDLCALC 68 11/04/2020 1209     Physical Exam:    Wt Readings from Last 3 Encounters:  02/09/21 194 lb 3.2 oz (88.1 kg)  01/07/21 194 lb (88 kg)  11/04/20 182 lb 15.7 oz (83 kg)     VS:  BP (!) 148/90 (BP Location: Right Arm, Patient Position: Sitting, Cuff Size: Normal)   Pulse 77   Ht 5\' 10"  (1.778 m)   Wt 194 lb 3.2 oz (88.1 kg)   BMI 27.86 kg/m  , BMI Body mass index is 27.86 kg/m. GENERAL:  Well appearing HEENT: Pupils equal round and reactive, fundi not visualized, oral mucosa unremarkable NECK:  No jugular venous distention, waveform within normal limits, carotid upstroke brisk and symmetric, no bruits, no thyromegaly LUNGS:  Clear to auscultation bilaterally HEART:  RRR.  PMI not displaced or sustained,S1 and S2 within normal limits, no S3, no S4, no clicks, no rubs, no murmurs ABD:  Flat, positive bowel sounds normal in frequency in pitch, no bruits, no rebound, no guarding, no midline pulsatile mass, no hepatomegaly, no splenomegaly EXT:  2 plus pulses throughout, no edema, no cyanosis no clubbing SKIN:  No rashes no nodules NEURO:  Cranial nerves II through XII grossly intact, motor  grossly intact throughout PSYCH:  Cognitively intact, oriented to person place and time   ASSESSMENT:    1. CRAO (central retinal artery occlusion), right   2. Essential hypertension   3. Smokes 1 pack of cigarettes per day    PLAN:    CRAO (central retinal artery occlusion), right Mr. Kaufer needs a 30-day monitor to rule out atrial fibrillation.  For now, he will continue aspirin and atorvastatin.  Lipids are well-controlled.  Essential hypertension Blood pressure is poorly controlled today.  It was also elevated when he saw Dr. Leonie Man.  In the hospital it was noted that his blood pressure is mostly controlled but was elevated at times.  We will have him track his blood pressures at home.  His goal should be less than 130/90.  If his blood pressure remains elevated when he follows up with our pharmacist he will need to start medication.  [name] going to focus on diet and exercise prior to that appointment.  Smokes 1 pack of cigarettes per day [name] currently abstaining from smoking.  He has been using both patches and gum.  We instructed him to chew the gum for a few chews and then park it between his cheek and mandible to be most effective.   Disposition: FU with PharmD in 2 months. FU with Zaide Kardell C. Oval Linsey, MD, [facility] in 6 months.  Medication Adjustments/Labs and Tests Ordered: Current medicines are reviewed at length with the patient today.  Concerns regarding medicines are outlined above.   Orders Placed This Encounter  Procedures   AMB Referral to [facility]   EKG  12-Lead     No orders of the defined types were placed in this encounter.   Patient Instructions  Medication Instructions:  Your physician recommends that you continue on your current medications as directed. Please refer to the Current Medication list given to you today.   *If you need a refill on your cardiac medications before your next appointment, please call your  pharmacy*  Lab Work: NONE   Testing/Procedures: Your physician has recommended that you wear an event monitor. Event monitors are medical devices that record the heart's electrical activity. Doctors most often [place] these monitors to diagnose arrhythmias. Arrhythmias are problems with the speed or rhythm of the heartbeat. The monitor is a small, portable device. You can wear one while you do your normal daily activities. This is usually used to diagnose what is causing palpitations/syncope (passing out).  Follow-Up: At [facility], you and your health needs are our priority.  As part of our continuing mission to provide you with exceptional heart care, we have created designated Provider Care Teams.  These Care Teams include your primary Cardiologist (physician) and Advanced Practice Providers (APPs -  Physician Assistants and Nurse Practitioners) who all work together to provide you with the care you need, when you need it.  We recommend signing up for the patient portal called "MyChart".  Sign up information is provided on this After Visit Summary.  MyChart is used to connect with patients for Virtual Visits (Telemedicine).  Patients are able to view lab/test results, encounter notes, upcoming appointments, etc.  Non-urgent messages can be sent to your provider as well.   To learn more about what you can do with MyChart, go to [email or web address].    Your next appointment:   6 month(s)  The format for your next appointment:   In Person  Provider:   Chilton Si, MD   Your physician recommends that you schedule a follow-up appointment in: PHARM D IN 2 MONTHS   Other Instructions MONITOR YOUR BLOOD PRESSURE DAILY  BRING YOUR READINGS AND MACHINE TO YOUR FOLLOW UP APPOINTMENT   [employer] Cardiac Event Monitor Instructions Your physician has requested you wear your cardiac event monitor for __30__ days, (1-30). [employer] may call or text to confirm a shipping address. The  monitor will be sent to a land address via UPS. [employer] will not ship a monitor to a PO BOX. It typically takes 3-5 days to receive your monitor after it has been enrolled. [employer] will assist with USPS tracking if your package is delayed. The telephone number for [employer] is [phone]. Once you have received your monitor, please review the enclosed instructions. Instruction tutorials can also be viewed under help and settings on the enclosed cell phone. Your monitor has already been registered assigning a specific monitor serial # to you.  Applying the monitor Remove cell phone from case and turn it on. The cell phone works as IT consultant and needs to be within [employer] of you at all times. The cell phone will need to be charged on a daily basis. We recommend you plug the cell phone into the enclosed charger at your bedside table every night.  Monitor batteries: You will receive two monitor batteries labelled #1 and #2. These are your recorders. Plug battery #2 onto the second connection on the enclosed charger. Keep one battery on the charger at all times. This will keep the monitor battery deactivated. It will also keep it fully charged for when you need to switch your monitor batteries.  A small light will be blinking on the battery emblem when it is charging. The light on the battery emblem will remain on when the battery is fully charged.  Open package of a Monitor strip. Insert battery #1 into black hood on strip and gently squeeze monitor battery onto connection as indicated in instruction booklet. Set aside while preparing skin.  Choose location for your strip, vertical or horizontal, as indicated in the instruction booklet. Shave to remove all hair from location. There cannot be any lotions, oils, powders, or colognes on skin where monitor is to be applied. Wipe skin clean with enclosed Saline wipe. Dry skin completely.  Peel paper labeled #1 off the back of the  Monitor strip exposing the adhesive. Place the monitor on the chest in the vertical or horizontal position shown in the instruction booklet. One arrow on the monitor strip must be pointing upward. Carefully remove paper labeled #2, attaching remainder of strip to your skin. Try not to create any folds or wrinkles in the strip as you apply it.  Firmly press and release the circle in the center of the monitor battery. You will hear a small beep. This is turning the monitor battery on. The heart emblem on the monitor battery will light up every 5 seconds if the monitor battery in turned on and connected to the patient securely. Do not push and hold the circle down as this turns the monitor battery off. The cell phone will locate the monitor battery. A screen will appear on the cell phone checking the connection of your monitor strip. This may read poor connection initially but change to good connection within the next minute. Once your monitor accepts the connection you will hear a series of 3 beeps followed by a climbing crescendo of beeps. A screen will appear on the cell phone showing the two monitor strip placement options. Touch the picture that demonstrates where you applied the monitor strip.  Your monitor strip and battery are waterproof. You are able to shower, bathe, or swim with the monitor on. They just ask you do not submerge deeper than 3 feet underwater. We recommend removing the monitor if you are swimming in a lake, river, or ocean.  Your monitor battery will need to be switched to a fully charged monitor battery approximately once a week. The cell phone will alert you of an action which needs to be made.  On the cell phone, tap for details to reveal connection status, monitor battery status, and cell phone battery status. The green dots indicates your monitor is in good status. A red dot indicates there is something that needs your attention.  To record a symptom, click the  circle on the monitor battery. In 30-60 seconds a list of symptoms will appear on the cell phone. Select your symptom and tap save. Your monitor will record a sustained or significant arrhythmia regardless of you clicking the button. Some patients do not feel the heart rhythm irregularities. [employer] will notify us of any serious or critical events.  Refer to instruction booklet for instructions on switching batteries, changing strips, the Do not disturb or Pause features, or any additional questions.  Call [employer] at [phone], to confirm your monitor is transmitting and record your baseline. They will answer any questions you may have regarding the monitor instructions at that time.  Returning the monitor to [place] all equipment back into blue box. Peel off strip of paper to expose adhesive and close box securely. There is  a prepaid UPS shipping label on this box. Drop in a UPS drop box, or at a UPS facility like [employer]. You may also contact [employer] to arrange UPS to pick up monitor package at your home.     I,[facility] Pena,acting as a Education administrator for Skeet Latch, MD.,have documented all relevant documentation on the behalf of Skeet Latch, MD,as directed by  Skeet Latch, MD while in the presence of Skeet Latch, MD.  I, [place] Oval Linsey, MD have reviewed all documentation for this visit.  The documentation of the exam, diagnosis, procedures, and orders on 02/09/2021 are all accurate and complete.   Signed, Skeet Latch, MD  02/09/2021 1:53 PM    [facility]

## 2021-02-09 NOTE — Assessment & Plan Note (Signed)
[facility]ena needs a 30-day monitor to rule out atrial fibrillation.  For now, he will continue aspirin and atorvastatin.  Lipids are well-controlled.

## 2021-02-09 NOTE — Progress Notes (Signed)
Patient ID: [facility] Pena, male   DOB: Oct 17, 1955, 65 y.o.   MRN: [id number] Patient enrolled for [employer] to ship a 30 day cardiac event monitor to his address on file.  Letter with instructions mailed to patient.

## 2021-02-09 NOTE — Assessment & Plan Note (Signed)
Blood pressure is poorly controlled today.  It was also elevated when he saw Dr. Pearlean Brownie.  In the hospital it was noted that his blood pressure is mostly controlled but was elevated at times.  We will have him track his blood pressures at home.  His goal should be less than 130/90.  If his blood pressure remains elevated when he follows up with our pharmacist he will need to start medication.  [name] going to focus on diet and exercise prior to that appointment.

## 2021-02-09 NOTE — Assessment & Plan Note (Signed)
[name] currently abstaining from smoking.  He has been using both patches and gum.  We instructed him to chew the gum for a few chews and then park it between his cheek and mandible to be most effective.

## 2021-02-10 NOTE — Progress Notes (Shared)
[facility] - Clinic Note  02/15/2021     CHIEF COMPLAINT Patient presents for No chief complaint on file.   HISTORY OF PRESENT ILLNESS: Victor Pena is a 65 y.o. male who presents to the clinic today for:    Pt states about a month ago, he lost vision in his right eye while in the shower, he states he initially thought he had shampoo in his eye, pt states about a year ago, he had surgery for aneurysm on both sides of his brain, pt states he went to the ED when he lost vision, and they gave him TPA to dissolve the clot in his eye, which caused bleeding in his brain, pt is taking an 81mg  aspirin  Referring physician: , FNP [address] [place],  [place]  HISTORICAL INFORMATION:   Selected notes from the MEDICAL RECORD NUMBER Referred by Dr. [id number] for eval of CRAO OD LEE: 08.29.22 Ocular Hx- PMH-    CURRENT MEDICATIONS: No current outpatient medications on file. (Ophthalmic Drugs)   No current facility-administered medications for this visit. (Ophthalmic Drugs)   Current Outpatient Medications (Other)  Medication Sig   acetaminophen (TYLENOL) 500 MG tablet Take 1,000 mg by mouth every 6 (six) hours as needed (Back pain).   aspirin EC 81 MG tablet Take 81 mg by mouth daily. Swallow whole.   atorvastatin (LIPITOR) 40 MG tablet TAKE 1 TABLET BY MOUTH EVERY DAY   No current facility-administered medications for this visit. (Other)    REVIEW OF SYSTEMS:   ALLERGIES Allergies  Allergen Reactions   Other Other (See Comments)    seasonal    PAST MEDICAL HISTORY Past Medical History:  Diagnosis Date   Cerebral aneurysm    Diplopia    Essential hypertension 02/09/2021   Intracranial aneurysm    Seasonal allergies    Stroke [facility])    Wears glasses    Past Surgical History:  Procedure Laterality Date   BACK SURGERY     COLONOSCOPY     IR 3D INDEPENDENT [facility]KST  07/19/2019   IR 3D INDEPENDENT [facility]KST  07/19/2019   IR 3D  INDEPENDENT [facility]KST  08/06/2019   IR 3D INDEPENDENT [facility]KST  08/19/2019   IR 3D INDEPENDENT [facility]KST  10/02/2019   IR ANGIO EXTERNAL CAROTID SEL EXT CAROTID BILAT MOD SED  07/19/2019   IR ANGIO INTRA EXTRACRAN SEL COM CAROTID INNOMINATE BILAT MOD SED  03/17/2020   IR ANGIO INTRA EXTRACRAN SEL INTERNAL CAROTID BILAT MOD SED  07/19/2019   IR ANGIO INTRA EXTRACRAN SEL INTERNAL CAROTID BILAT MOD SED  10/02/2019   IR ANGIO INTRA EXTRACRAN SEL INTERNAL CAROTID BILAT MOD SED  10/20/2020   IR ANGIO VERTEBRAL SEL SUBCLAVIAN INNOMINATE UNI L MOD SED  03/17/2020   IR ANGIO VERTEBRAL SEL VERTEBRAL BILAT MOD SED  07/19/2019   IR ANGIO VERTEBRAL SEL VERTEBRAL BILAT MOD SED  10/20/2020   IR ANGIO VERTEBRAL SEL VERTEBRAL UNI R MOD SED  10/02/2019   IR ANGIO VERTEBRAL SEL VERTEBRAL UNI R MOD SED  03/17/2020   IR ANGIOGRAM FOLLOW UP STUDY  08/06/2019   IR ANGIOGRAM FOLLOW UP STUDY  08/06/2019   IR ANGIOGRAM FOLLOW UP STUDY  08/06/2019   IR ANGIOGRAM FOLLOW UP STUDY  08/06/2019   IR ANGIOGRAM FOLLOW UP STUDY  08/06/2019   IR ANGIOGRAM FOLLOW UP STUDY  08/06/2019   IR ANGIOGRAM FOLLOW UP STUDY  10/02/2019   IR CT HEAD LTD  08/06/2019  IR CT HEAD LTD  08/19/2019   IR CT HEAD LTD  10/02/2019   IR INTRA CRAN STENT  08/19/2019   IR NEURO EACH ADD'L AFTER BASIC UNI LEFT (MS)  08/06/2019   IR TRANSCATH/EMBOLIZ  08/06/2019   IR TRANSCATH/EMBOLIZ  10/02/2019       IR TRANSCATH/EMBOLIZ  10/02/2019   IR US GUIDE VASC ACCESS RIGHT  07/19/2019   IR US GUIDE VASC ACCESS RIGHT  08/06/2019   IR US GUIDE VASC ACCESS RIGHT  08/19/2019   IR US GUIDE VASC ACCESS RIGHT  10/02/2019   IR US GUIDE VASC ACCESS RIGHT  03/17/2020   IR US GUIDE VASC ACCESS RIGHT  10/20/2020   RADIOLOGY WITH ANESTHESIA N/A 08/06/2019   Procedure: IR WITH ANESTHESIA;  Surgeon: Radiologist, Medication, MD;  Location: [facility] OR;  Service: Radiology;  Laterality: N/A;   RADIOLOGY WITH ANESTHESIA N/A 08/19/2019   Procedure: STENT PLACEMENT;  Surgeon: Baldemar Lenis, MD;  Location: [facility] OR;   Service: Radiology;  Laterality: N/A;   RADIOLOGY WITH ANESTHESIA N/A 10/02/2019   Procedure: RADIOLOGY WITH ANESTHESIA  EMBOLIZATION;  Surgeon: Baldemar Lenis, MD;  Location: [facility] OR;  Service: Radiology;  Laterality: N/A;   WISDOM TOOTH EXTRACTION      FAMILY HISTORY Family History  Problem Relation Age of Onset   Hypertension Father    Aneurysm Father        brain    SOCIAL HISTORY Social History   Tobacco Use   Smoking status: Former    Packs/day: 0.25    Types: Cigarettes   Smokeless tobacco: Never  Vaping Use   Vaping Use: Never used  Substance Use Topics   Alcohol use: Yes    Alcohol/week: 10.0 standard drinks    Types: 10 Glasses of wine per week    Comment: one to two glasses or wine per night   Drug use: Not Currently         OPHTHALMIC EXAM:  Not recorded     IMAGING AND PROCEDURES  Imaging and Procedures for 02/15/2021          ASSESSMENT/PLAN:    ICD-10-CM   1. CRAO (central retinal artery occlusion), right  H34.11     2. Retinal edema  H35.81     3. Essential hypertension  I10     4. Hypertensive retinopathy of both eyes  H35.033     5. Combined forms of age-related cataract of both eyes  H25.813        1,2. CRAO OD  - acute onset painless vision loss on 8.3.22 - presented to ED where IV TPA was administered - developed subarachnoid heme in bilateral occipital lobes - [facility] NLP OD - exam shows resolving retinal whitening, no cherry red spot, but large central blot hemes; thready arterioles - OCT shows inner hyperreflectivity and diffuse inner retinal atrophy and focal ORA - discussed findings, guarded prognosis, and possible development of neovascularization and glaucoma ~3 mos post CRAO onset - no retinal or ophthalmic interventions indicated or recommended - f/u 2 months -- DFE/OCT, possible injection  3,4. Hypertensive retinopathy OU - discussed importance of tight BP control - monitor  5. Mixed Cataract OU -  The symptoms of cataract, surgical options, and treatments and risks were discussed with patient. - discussed diagnosis and progression - not yet visually significant - monitor for now  Ophthalmic Meds Ordered this visit:  No orders of the defined types were placed in this encounter.     No  follow-ups on file.  There are no Patient Instructions on file for this visit.   Explained the diagnoses, plan, and follow up with the patient and they expressed understanding.  Patient expressed understanding of the importance of proper follow up care.   This document serves as a record of services personally performed by Karie Chimera, MD, PhD. It was created on their behalf by Annalee Genta, COMT. The creation of this record is the provider's dictation and/or activities during the visit.  Electronically signed by: Annalee Genta, COMT 02/10/21 9:33 AM     Karie Chimera, M.D., Ph.D. Diseases & Surgery of the Retina and Vitreous [facility]    Abbreviations: M myopia (nearsighted); A astigmatism; H hyperopia (farsighted); P presbyopia; Mrx spectacle prescription;  CTL contact lenses; OD right eye; OS left eye; OU both eyes  XT exotropia; ET esotropia; PEK punctate epithelial keratitis; PEE punctate epithelial erosions; DES dry eye syndrome; MGD meibomian gland dysfunction; ATs artificial tears; PFAT's preservative free artificial tears; NSC nuclear sclerotic cataract; PSC posterior subcapsular cataract; ERM epi-retinal membrane; PVD posterior vitreous detachment; RD retinal detachment; DM diabetes mellitus; DR diabetic retinopathy; NPDR non-proliferative diabetic retinopathy; PDR proliferative diabetic retinopathy; CSME clinically significant macular edema; DME diabetic macular edema; dbh dot blot hemorrhages; CWS cotton wool spot; POAG primary open angle glaucoma; C/D cup-to-disc ratio; HVF humphrey visual field; GVF goldmann visual field; OCT optical coherence tomography;  IOP intraocular pressure; BRVO Branch retinal vein occlusion; CRVO central retinal vein occlusion; CRAO central retinal artery occlusion; BRAO branch retinal artery occlusion; RT retinal tear; SB scleral buckle; PPV pars plana vitrectomy; VH Vitreous hemorrhage; PRP panretinal laser photocoagulation; IVK intravitreal kenalog; VMT vitreomacular traction; MH Macular hole;  NVD neovascularization of the disc; NVE neovascularization elsewhere; AREDS age related eye disease study; ARMD age related macular degeneration; POAG primary open angle glaucoma; EBMD epithelial/anterior basement membrane dystrophy; ACIOL anterior chamber intraocular lens; IOL intraocular lens; PCIOL posterior chamber intraocular lens; Phaco/IOL phacoemulsification with intraocular lens placement; PRK photorefractive keratectomy; LASIK laser assisted in situ keratomileusis; HTN hypertension; DM diabetes mellitus; COPD chronic obstructive pulmonary disease

## 2021-02-11 ENCOUNTER — Other Ambulatory Visit: Payer: Self-pay

## 2021-02-11 NOTE — Patient Outreach (Signed)
[facility]) Care Management  02/11/2021  Victor Pena 11/16/1955 [id number]   Telephone outreach to patient to obtain mRS was successfully completed. MRS= 2   [name] [facility] Care Management Assistant

## 2021-02-15 ENCOUNTER — Encounter (INDEPENDENT_AMBULATORY_CARE_PROVIDER_SITE_OTHER): Payer: Self-pay

## 2021-02-15 ENCOUNTER — Encounter (INDEPENDENT_AMBULATORY_CARE_PROVIDER_SITE_OTHER): Payer: Medicare Other | Admitting: Ophthalmology

## 2021-02-15 DIAGNOSIS — H25813 Combined forms of age-related cataract, bilateral: Secondary | ICD-10-CM

## 2021-02-15 DIAGNOSIS — H3581 Retinal edema: Secondary | ICD-10-CM

## 2021-02-15 DIAGNOSIS — H3411 Central retinal artery occlusion, right eye: Secondary | ICD-10-CM

## 2021-02-15 DIAGNOSIS — I10 Essential (primary) hypertension: Secondary | ICD-10-CM

## 2021-02-15 DIAGNOSIS — H35033 Hypertensive retinopathy, bilateral: Secondary | ICD-10-CM

## 2021-03-01 DIAGNOSIS — H2513 Age-related nuclear cataract, bilateral: Secondary | ICD-10-CM | POA: Diagnosis not present

## 2021-03-01 DIAGNOSIS — H3411 Central retinal artery occlusion, right eye: Secondary | ICD-10-CM | POA: Diagnosis not present

## 2021-04-13 ENCOUNTER — Ambulatory Visit: Payer: Medicare Other | Admitting: Pharmacist Clinician (PhC)/ Clinical Pharmacy Specialist

## 2021-04-13 ENCOUNTER — Other Ambulatory Visit: Payer: Self-pay

## 2021-04-13 ENCOUNTER — Ambulatory Visit: Payer: Medicare Other

## 2021-04-13 VITALS — BP 142/80 | HR 83 | Resp 16 | Ht 71.0 in | Wt 199.6 lb

## 2021-04-13 DIAGNOSIS — I10 Essential (primary) hypertension: Secondary | ICD-10-CM

## 2021-04-13 MED ORDER — ATORVASTATIN CALCIUM 40 MG PO TABS: 40.0000 mg | ORAL_TABLET | Freq: Every day | ORAL | 3 refills | Status: DC

## 2021-04-13 MED ORDER — VALSARTAN 80 MG PO TABS: 80.0000 mg | ORAL_TABLET | Freq: Every day | ORAL | 3 refills | Status: DC

## 2021-04-13 NOTE — Assessment & Plan Note (Signed)
Patient with essential hypertension, not at BP goal since last visit.  Had been hoping to bring down with lifestyle modifications, but admitted difficult during the holiday season.  Will start him on valsartan 80 mg today and check metabolic panel in 2 weeks.  Reviewed lifestyle modification options.  He has mostly quit smoking, using nicotine gum, but admits to occasional lapses.  He understands the importance of quitting completely and continues to work toward that goal.  He will also work on dietary modifications and increasing exercise.  Encouraged him that we could consider stopping medication in the future should he be able to make some of these changes.  Will see him back in 2 months for follow up.

## 2021-04-13 NOTE — Progress Notes (Signed)
04/13/2021 [name] Apr 12, 1955 [id number]   HPI:  Victor Pena is a 66 y.o. male patient of Dr Duke Salvia, with a PMH below who presents today for hypertension clinic evaluation.  He was seen by Dr. Duke Salvia in November, at which time his pressure was 148/90.  It was noted that hospital BP readings in August were mostly normal. He was asked to track readings at home for 2 months and then follow up in the office.  Today he reports that he purchased a home BP cuff about 2-3 weeks ago, but it has been giving him mostly error codes and he is planning to return it.   He has been feeling well overall and has no complaints today.  Notes that he is considering retirement and looking forward to having more time.    Past Medical History: stroke Central retinal artery occlusion 8/22, received TPA and developed SAH, did not require intervention  Intracranial aneurysm MCA stent 5/21, right ICA embolization 6/21  Tobacco abuse Trying to quit - using nicotine gum     Blood Pressure Goal:  130/80  Current Medications: none  Family Hx: father died from brain aneurysm at 78; mother died from cancer; 1 sister healthy; 4 kids, oldest 81, has small aneurysm;   Social Hx: nicotine gum, occasional relapses; 1-2 glasses wine; 1-2 coffee in morning, only rare soda   Diet: mix of home and eating out, only rarely fast food; mix of meats, no fried; most veggies fresh;   Exercise: golf couple times per month  Home BP readings: no home cuff - has to return CVS cuf  Intolerances: nkda  Labs: 8/22 - Na 136, K 4.3, Glu 89, BUN 12, SCr 0.88, GFR > 60   Wt Readings from Last 3 Encounters:  04/13/21 199 lb 9.6 oz (90.5 kg)  02/09/21 194 lb 3.2 oz (88.1 kg)  01/07/21 194 lb (88 kg)   BP Readings from Last 3 Encounters:  04/13/21 (!) 142/80  02/09/21 (!) 148/90  01/07/21 (!) 157/84   Pulse Readings from Last 3 Encounters:  04/13/21 83  02/09/21 77  01/07/21 77    Current Outpatient Medications   Medication Sig Dispense Refill   acetaminophen (TYLENOL) 500 MG tablet Take 1,000 mg by mouth every 6 (six) hours as needed (Back pain).     aspirin EC 81 MG tablet Take 81 mg by mouth daily. Swallow whole.     valsartan (DIOVAN) 80 MG tablet Take 1 tablet (80 mg total) by mouth daily. 90 tablet 3   atorvastatin (LIPITOR) 40 MG tablet Take 1 tablet (40 mg total) by mouth daily. 90 tablet 3   No current facility-administered medications for this visit.    Allergies  Allergen Reactions   Other Other (See Comments)    seasonal    Past Medical History:  Diagnosis Date   Cerebral aneurysm    Diplopia    Essential hypertension 02/09/2021   Intracranial aneurysm    Seasonal allergies    Stroke [facility])    Wears glasses     Blood pressure (!) 142/80, pulse 83, resp. rate 16, height 5\' 11"  (1.803 m), weight 199 lb 9.6 oz (90.5 kg), SpO2 97 %.  Essential hypertension Patient with essential hypertension, not at BP goal since last visit.  Had been hoping to bring down with lifestyle modifications, but admitted difficult during the holiday season.  Will start him on valsartan 80 mg today and check metabolic panel in 2 weeks.  Reviewed  lifestyle modification options.  He has mostly quit smoking, using nicotine gum, but admits to occasional lapses.  He understands the importance of quitting completely and continues to work toward that goal.  He will also work on dietary modifications and increasing exercise.  Encouraged him that we could consider stopping medication in the future should he be able to make some of these changes.  Will see him back in 2 months for follow up.   Phillips Hay PharmD CPP [facility] [address] [place], [place] [address] [phone]

## 2021-04-13 NOTE — Patient Instructions (Signed)
Return for a a follow up appointment in March 7 at 8:30 am  Go to the lab in about 2 weeks to check kidney function  Check your blood pressure at home 3-4 times each week and keep record of the readings.  Look for an [employer] home blood pressure cuff  Take your BP meds as follows:  Start valsartan 80 mg once daily  Bring all of your meds, your BP cuff and your record of home blood pressures to your next appointment.  Exercise as youre able, try to walk approximately 30 minutes per day.  Keep salt intake to a minimum, especially watch canned and prepared boxed foods.  Eat more fresh fruits and vegetables and fewer canned items.  Avoid eating in fast food restaurants.    HOW TO TAKE YOUR BLOOD PRESSURE: Rest 5 minutes before taking your blood pressure.  Dont smoke or drink caffeinated beverages for at least 30 minutes before. Take your blood pressure before (not after) you eat. Sit comfortably with your back supported and both feet on the floor (dont cross your legs). Elevate your arm to heart level on a table or a desk. Use the proper sized cuff. It should fit smoothly and snugly around your bare upper arm. There should be enough room to slip a fingertip under the cuff. The bottom edge of the cuff should be 1 inch above the crease of the elbow. Ideally, take 3 measurements at one sitting and record the average.

## 2021-05-07 DIAGNOSIS — H3411 Central retinal artery occlusion, right eye: Secondary | ICD-10-CM | POA: Diagnosis not present

## 2021-05-07 DIAGNOSIS — Z0000 Encounter for general adult medical examination without abnormal findings: Secondary | ICD-10-CM | POA: Diagnosis not present

## 2021-05-07 DIAGNOSIS — H35033 Hypertensive retinopathy, bilateral: Secondary | ICD-10-CM | POA: Diagnosis not present

## 2021-05-07 DIAGNOSIS — Z125 Encounter for screening for malignant neoplasm of prostate: Secondary | ICD-10-CM | POA: Diagnosis not present

## 2021-05-07 DIAGNOSIS — E782 Mixed hyperlipidemia: Secondary | ICD-10-CM | POA: Diagnosis not present

## 2021-05-17 ENCOUNTER — Encounter: Payer: Self-pay | Admitting: Neurology

## 2021-06-08 ENCOUNTER — Other Ambulatory Visit: Payer: Self-pay

## 2021-06-08 ENCOUNTER — Ambulatory Visit (INDEPENDENT_AMBULATORY_CARE_PROVIDER_SITE_OTHER): Payer: Medicare Other | Admitting: Pharmacist

## 2021-06-08 VITALS — BP 144/90 | HR 71 | Resp 15 | Ht 71.0 in | Wt 199.2 lb

## 2021-06-08 DIAGNOSIS — I10 Essential (primary) hypertension: Secondary | ICD-10-CM

## 2021-06-08 DIAGNOSIS — H35039 Hypertensive retinopathy, unspecified eye: Secondary | ICD-10-CM | POA: Insufficient documentation

## 2021-06-08 DIAGNOSIS — F17200 Nicotine dependence, unspecified, uncomplicated: Secondary | ICD-10-CM | POA: Insufficient documentation

## 2021-06-08 DIAGNOSIS — I6310 Cerebral infarction due to embolism of unspecified precerebral artery: Secondary | ICD-10-CM | POA: Diagnosis not present

## 2021-06-08 DIAGNOSIS — E782 Mixed hyperlipidemia: Secondary | ICD-10-CM | POA: Insufficient documentation

## 2021-06-08 LAB — BASIC METABOLIC PANEL
BUN/Creatinine Ratio: 11 (ref 10–24)
BUN: 11 mg/dL (ref 8–27)
CO2: 26 mmol/L (ref 20–29)
Calcium: 9.7 mg/dL (ref 8.6–10.2)
Chloride: 103 mmol/L (ref 96–106)
Creatinine, Ser: 0.99 mg/dL (ref 0.76–1.27)
Glucose: 82 mg/dL (ref 70–99)
Potassium: 5.1 mmol/L (ref 3.5–5.2)
Sodium: 141 mmol/L (ref 134–144)
eGFR: 84 mL/min/{1.73_m2} (ref >59)

## 2021-06-08 MED ORDER — NICOTINE POLACRILEX 4 MG MT GUM: CHEWING_GUM | OROMUCOSAL | 0 refills | Status: DC

## 2021-06-08 NOTE — Progress Notes (Signed)
Patient ID: Victor Pena                 DOB: 16-Sep-1955                      MRN: [id number] ? ? ? ? ?HPI: ?Victor Pena is a 66 y.o. male referred by Dr. Duke Salvia to HTN clinic. PMH is significant for HTN, stroke, tobacco use, subarachnoid hemorrhage, retinal artery occlusion, and HLD.  Valsartan was started at last visit with PharmD. ? ?Patient presents today in good spirits. Believs his BP is elevated at medical appointments due to anxiety.  Is not checking BP at home because his CVS cuff was defective.  Daughter purchased him a new home cuff which is supposed to arrive today. ? 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  Is trying to sell his company so he can retire. ? ?Golfs a few times each month which is most of his exercise.  Continues to smoke about 2-3 cigarettes a day. ? ?Tries to eat well. Does not add salt to foods. Reports no pain. Is sleeping well. ? ?Has had no adverse effects to valsartan. ? ?Current HTN meds:  ?Valsartan 80mg  daily ? ?BP goal: 130/90 ? ? ?Wt Readings from Last 3 Encounters:  ?04/13/21 199 lb 9.6 oz (90.5 kg)  ?02/09/21 194 lb 3.2 oz (88.1 kg)  ?01/07/21 194 lb (88 kg)  ? ?BP Readings from Last 3 Encounters:  ?04/13/21 (!) 142/80  ?02/09/21 (!) 148/90  ?01/07/21 (!) 157/84  ? ?Pulse Readings from Last 3 Encounters:  ?04/13/21 83  ?02/09/21 77  ?01/07/21 77  ? ? ?Renal function: ?CrCl cannot be calculated (Patient's most recent lab result is older than the maximum 21 days allowed.). ? ?Past Medical History:  ?Diagnosis Date  ? Cerebral aneurysm   ? Diplopia   ? Essential hypertension 02/09/2021  ? Intracranial aneurysm   ? Seasonal allergies   ? Stroke [facility])   ? Wears glasses   ? ? ?Current Outpatient Medications on File Prior to Visit  ?Medication Sig Dispense Refill  ? acetaminophen (TYLENOL) 500 MG tablet Take 1,000 mg by mouth every 6 (six) hours as needed (Back pain).    ? aspirin EC 81 MG tablet Take 81 mg by mouth daily.  Swallow whole.    ? atorvastatin (LIPITOR) 40 MG tablet Take 1 tablet (40 mg total) by mouth daily. 90 tablet 3  ? valsartan (DIOVAN) 80 MG tablet Take 1 tablet (80 mg total) by mouth daily. 90 tablet 3  ? ?No current facility-administered medications on file prior to visit.  ? ? ?Allergies  ?Allergen Reactions  ? Other Other (See Comments)  ?  seasonal  ? ? ? ?Assessment/Plan: ? ?1. Hypertension -  Patient BP in room 144/90 which is above goal of <130/90. Patient believes he has white coat HTN, but can not verify since he has not been checking BP at home.  Advised that when cuff arrives today to begin checking at home and log results. ? ?Needed to update BMP last month after starting valsartan but he has not completed yet. If results come back normal, will consider increasing valsartan and/or adding HCTZ to regimen.  Patient voiced understanding. ? ?Continue valsartan 80mg  daily ?Check BMP ?Refill nicotine gum ?Recheck in 1-2 months ? ?[facility], PharmD, BCACP, CDCES, CPP ?[address] ?[place], [place], [place] ?Phone: [phone], Fax: [phone]  ?

## 2021-06-08 NOTE — Patient Instructions (Addendum)
It was nice meeting you today ? ?We would like your blood pressure to be less than 130/90 ? ?Continue taking your valsartan 80mg  daily ? ?I sent in a refill for your nicotine gum.  Continue to chew and park one piece as needed ? ?We will update your lab work today and then consider increasing medications or adding on new ones ? ?Please start checking your blood pressure at home with your new cuff and let us know what your home readings are ? ?Please call with any questions ? ?Karren Cobble, PharmD, BCACP, [place], CPP ?[address], [address] ?[place], [place], [address] ?Phone: [phone], Fax: [phone]  ? ?

## 2021-07-08 ENCOUNTER — Ambulatory Visit: Payer: Medicare Other | Admitting: Neurology

## 2021-07-12 NOTE — Progress Notes (Signed)
Event Monitor was never applied. Order will be cancelled

## 2021-07-16 DIAGNOSIS — H4720 Unspecified optic atrophy: Secondary | ICD-10-CM | POA: Diagnosis not present

## 2021-07-20 ENCOUNTER — Telehealth: Payer: Self-pay

## 2021-07-20 ENCOUNTER — Ambulatory Visit: Payer: Medicare Other

## 2021-07-20 NOTE — Progress Notes (Deleted)
Patient ID: Victor Pena                 DOB: September 07, 1955                      MRN: [id number]     HPI: Victor Pena is a 66 y.o. male referred by Dr. Junie Bame to HTN clinic. PMH is significant for HTN, stroke, tobacco use, subarachnoid hemorrhage, retinal artery occlusion, and HLD.  Valsartan was started at last visit with PharmD  Current HTN meds: Valsartan 80mg  daily Previously tried:  BP goal:   Family History:   Social History:   Diet:   Exercise:   Home BP readings:   Wt Readings from Last 3 Encounters:  06/08/21 199 lb 3.2 oz (90.4 kg)  04/13/21 199 lb 9.6 oz (90.5 kg)  02/09/21 194 lb 3.2 oz (88.1 kg)   BP Readings from Last 3 Encounters:  06/08/21 (!) 144/90  04/13/21 (!) 142/80  02/09/21 (!) 148/90   Pulse Readings from Last 3 Encounters:  06/08/21 71  04/13/21 83  02/09/21 77    Renal function: CrCl cannot be calculated (Patient's most recent lab result is older than the maximum 21 days allowed.).  Past Medical History:  Diagnosis Date   Cerebral aneurysm    Diplopia    Essential hypertension 02/09/2021   Intracranial aneurysm    Seasonal allergies    Stroke [facility])    Wears glasses     Current Outpatient Medications on File Prior to Visit  Medication Sig Dispense Refill   acetaminophen (TYLENOL) 500 MG tablet Take 1,000 mg by mouth every 6 (six) hours as needed (Back pain).     aspirin EC 81 MG tablet Take 81 mg by mouth daily. Swallow whole.     atorvastatin (LIPITOR) 40 MG tablet Take 1 tablet (40 mg total) by mouth daily. 90 tablet 3   nicotine polacrilex (NICORETTE) 4 MG gum Chew and park one piece of gum as needed for smoking cessation 100 tablet 0   valsartan (DIOVAN) 80 MG tablet Take 1 tablet (80 mg total) by mouth daily. 90 tablet 3   No current facility-administered medications on file prior to visit.    Allergies  Allergen Reactions   Other Other (See Comments)    seasonal     Assessment/Plan:  1. Hypertension -

## 2021-07-20 NOTE — Telephone Encounter (Signed)
Lmom for missed pharmd appt  ?

## 2021-09-10 ENCOUNTER — Ambulatory Visit (HOSPITAL_BASED_OUTPATIENT_CLINIC_OR_DEPARTMENT_OTHER): Payer: Medicare Other | Admitting: Cardiovascular Disease

## 2021-09-10 VITALS — BP 133/87 | HR 70 | Ht 71.0 in | Wt 193.6 lb

## 2021-09-10 DIAGNOSIS — E7800 Pure hypercholesterolemia, unspecified: Secondary | ICD-10-CM | POA: Diagnosis not present

## 2021-09-10 DIAGNOSIS — F17210 Nicotine dependence, cigarettes, uncomplicated: Secondary | ICD-10-CM | POA: Diagnosis not present

## 2021-09-10 DIAGNOSIS — I10 Essential (primary) hypertension: Secondary | ICD-10-CM | POA: Diagnosis not present

## 2021-09-10 DIAGNOSIS — I6310 Cerebral infarction due to embolism of unspecified precerebral artery: Secondary | ICD-10-CM | POA: Diagnosis not present

## 2021-09-10 DIAGNOSIS — E782 Mixed hyperlipidemia: Secondary | ICD-10-CM

## 2021-09-10 NOTE — Progress Notes (Signed)
Cardiology Office Note:    Date:  10/06/2021   ID:  Victor Pena, DOB 09-09-1955, MRN [id number]  PCP:  Kristen Loader, FNP   [facility] Providers Cardiologist:  None     Referring MD: Kristen Loader, FNP   No chief complaint on file.   History of Present Illness:    Victor Pena is a 66 y.o. male with a hx of stroke, intracranial aneurysm, and tobacco abuse. 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 He received TPA and had a SAH. He was stable and did not require intervention. Echo at admission revealed LVEF 55-60% with grade 1 diastolic dysfunction. 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 A 30-day monitor was ordered 11/06/2020 by neurology to evaluate for etiology of CRAO. The monitor was not completed, and a new enrollment in [employer] will be needed.   At his last appointment he was feeling well.  His blood pressures were above goal.  He followed up with our pharmacist and was started on valsartan.  He was again elevated when he saw the pharmacist 06/2021 but he thought it was whitecoat hypertension.  Lately his blood pressure at home has been around 123/70s.  Very rarely in the 130s.  He hasn't ben getting much exercise but is active.  He has no exertional CP or SOB.  His diet has ben pretty good. He would like to quit smoking.   He had bad nightmares on Wellbutrin.  Past Medical History:  Diagnosis Date   Cerebral aneurysm    Diplopia    Essential hypertension 02/09/2021   Intracranial aneurysm    Seasonal allergies    Stroke [facility])    Wears glasses     Past Surgical History:  Procedure Laterality Date   BACK SURGERY     COLONOSCOPY     IR 3D INDEPENDENT WKST  07/19/2019   IR 3D INDEPENDENT WKST  07/19/2019   IR 3D INDEPENDENT WKST  08/06/2019   IR 3D INDEPENDENT WKST  08/19/2019   IR 3D INDEPENDENT WKST  10/02/2019   IR ANGIO EXTERNAL CAROTID SEL EXT CAROTID BILAT MOD SED  07/19/2019    IR ANGIO INTRA EXTRACRAN SEL COM CAROTID INNOMINATE BILAT MOD SED  03/17/2020   IR ANGIO INTRA EXTRACRAN SEL INTERNAL CAROTID BILAT MOD SED  07/19/2019   IR ANGIO INTRA EXTRACRAN SEL INTERNAL CAROTID BILAT MOD SED  10/02/2019   IR ANGIO INTRA EXTRACRAN SEL INTERNAL CAROTID BILAT MOD SED  10/20/2020   IR ANGIO VERTEBRAL SEL SUBCLAVIAN INNOMINATE UNI L MOD SED  03/17/2020   IR ANGIO VERTEBRAL SEL VERTEBRAL BILAT MOD SED  07/19/2019   IR ANGIO VERTEBRAL SEL VERTEBRAL BILAT MOD SED  10/20/2020   IR ANGIO VERTEBRAL SEL VERTEBRAL UNI R MOD SED  10/02/2019   IR ANGIO VERTEBRAL SEL VERTEBRAL UNI R MOD SED  03/17/2020   IR ANGIOGRAM FOLLOW UP STUDY  08/06/2019   IR ANGIOGRAM FOLLOW UP STUDY  08/06/2019   IR ANGIOGRAM FOLLOW UP STUDY  08/06/2019   IR ANGIOGRAM FOLLOW UP STUDY  08/06/2019   IR ANGIOGRAM FOLLOW UP STUDY  08/06/2019   IR ANGIOGRAM FOLLOW UP STUDY  08/06/2019   IR ANGIOGRAM FOLLOW UP STUDY  10/02/2019   IR CT HEAD LTD  08/06/2019   IR CT HEAD LTD  08/19/2019   IR CT HEAD LTD  10/02/2019   IR INTRA CRAN STENT  08/19/2019   IR NEURO  EACH ADD'L AFTER BASIC UNI LEFT (MS)  08/06/2019   IR TRANSCATH/EMBOLIZ  08/06/2019   IR TRANSCATH/EMBOLIZ  10/02/2019       IR TRANSCATH/EMBOLIZ  10/02/2019   IR US GUIDE VASC ACCESS RIGHT  07/19/2019   IR US GUIDE VASC ACCESS RIGHT  08/06/2019   IR US GUIDE VASC ACCESS RIGHT  08/19/2019   IR US GUIDE VASC ACCESS RIGHT  10/02/2019   IR US GUIDE VASC ACCESS RIGHT  03/17/2020   IR US GUIDE VASC ACCESS RIGHT  10/20/2020   RADIOLOGY WITH ANESTHESIA N/A 08/06/2019   Procedure: IR WITH ANESTHESIA;  Surgeon: Radiologist, Medication, MD;  Location: [place];  Service: Radiology;  Laterality: N/A;   RADIOLOGY WITH ANESTHESIA N/A 08/19/2019   Procedure: STENT PLACEMENT;  Surgeon: Pedro Earls, MD;  Location: [place];  Service: Radiology;  Laterality: N/A;   RADIOLOGY WITH ANESTHESIA N/A 10/02/2019   Procedure: RADIOLOGY WITH ANESTHESIA  EMBOLIZATION;  Surgeon: Pedro Earls, MD;  Location: [place];  Service: Radiology;  Laterality: N/A;   WISDOM TOOTH EXTRACTION      Current Medications: Current Meds  Medication Sig   acetaminophen (TYLENOL) 500 MG tablet Take 1,000 mg by mouth every 6 (six) hours as needed (Back pain).   aspirin EC 81 MG tablet Take 81 mg by mouth daily. Swallow whole.   atorvastatin (LIPITOR) 40 MG tablet Take 1 tablet (40 mg total) by mouth daily.   nicotine polacrilex (NICORETTE) 4 MG gum Chew and park one piece of gum as needed for smoking cessation   valsartan (DIOVAN) 80 MG tablet Take 1 tablet (80 mg total) by mouth daily.     Allergies:   Other   Social History   Socioeconomic History   Marital status: Married    Spouse name: Not on file   Number of children: 4   Years of education: 16   Highest education level: Not on file  Occupational History   Occupation: owner   Tobacco Use   Smoking status: Some Days    Packs/day: 0.25    Types: Cigarettes   Smokeless tobacco: Never  Vaping Use   Vaping Use: Never used  Substance and Sexual Activity   Alcohol use: Yes    Alcohol/week: 10.0 standard drinks of alcohol    Types: 10 Glasses of wine per week    Comment: one to two glasses or wine per night   Drug use: Not Currently   Sexual activity: Not on file  Other Topics Concern   Not on file  Social History Narrative   Lives at home with wife and 2 daughters   Right handed   Drinks 4-5 cups caffeine daily   Social Determinants of Health   Financial Resource Strain: Low Risk  (02/09/2021)   Overall Financial Resource Strain (CARDIA)    Difficulty of Paying Living Expenses: Not hard at all  Food Insecurity: No Food Insecurity (02/09/2021)   Hunger Vital Sign    Worried About Running Out of Food in the Last Year: Never true    Ran Out of Food in the Last Year: Never true  Transportation Needs: Not on file  Physical Activity: Inactive (02/09/2021)   Exercise Vital Sign    Days of Exercise per Week: 0 days     Minutes of Exercise per Session: 0 min  Stress: Not on file  Social Connections: Not on file     Family History: The patient's family history includes Aneurysm in his father;  Hypertension in his father.  ROS:   Please see the history of present illness.    (+) Loss of vision, right eye (+) Fatigue All other systems reviewed and are negative.  EKGs/Labs/Other Studies Reviewed:    The following studies were reviewed today:  Echo 11/05/2020:  1. Left ventricular ejection fraction, by estimation, is 55 to 60%. The  left ventricle has normal function. The left ventricle has no regional  wall motion abnormalities. Left ventricular diastolic parameters are  consistent with Grade I diastolic  dysfunction (impaired relaxation).   2. Right ventricular systolic function is normal. The right ventricular  size is normal. There is normal pulmonary artery systolic pressure. The  estimated right ventricular systolic pressure is 28.4 mmHg.   3. The mitral valve is normal in structure. Trivial mitral valve  regurgitation. No evidence of mitral stenosis.   4. The aortic valve is tricuspid. Aortic valve regurgitation is not  visualized. No aortic stenosis is present.   5. The inferior vena cava is normal in size with greater than 50%  respiratory variability, suggesting right atrial pressure of 3 mmHg.  CT Venogram Head 11/04/2020: FINDINGS: Superior sagittal sinus enhances normally and is widely patent. Internal cerebral veins and straight sinus widely patent. The transverse sinus and sigmoid sinus is patent bilaterally. There are small lead filling defects in the lateral transverse sinus bilaterally which are symmetric and likely due to arachnoid granulation. If this were thrombus it would be small and not likely to be the cause of the patient's symptoms.   Ventricle size remains normal. Occipital subarachnoid hemorrhage again noted. Rounded parenchymal hemorrhage in the occipital  lobes bilaterally right greater than left similar to the recent CT from today. No new area of hemorrhage.   Aneurysm coiling and stenting left MCA. Flow diverting stent right cavernous carotid.   IMPRESSION: Small filling defects in the lateral transverse sinus bilaterally, likely arachnoid granulation rather than small areas of thrombus. Otherwise normal enhancement of the dural venous sinuses.   Subarachnoid and parenchymal hemorrhage in the occipital lobe bilaterally similar to the CT earlier today.  CTA Head and Neck 11/04/2020: FINDINGS: CTA NECK   Aortic arch: Mild mixed plaque along the arch and patent great vessel origins.   Right carotid system: Patent. Mild calcified plaque at the ICA origin without stenosis.   Left carotid system: Patent. Minor plaque at the ICA origin without stenosis.   Vertebral arteries: Patent and codominant.  No stenosis.   Skeleton: No acute osseous abnormality.   Other neck: Unremarkable.   Upper chest: Included upper lungs are clear.   Review of the MIP images confirms the above findings   CTA HEAD   Anterior circulation: Intracranial internal carotid arteries are patent. Minor calcified plaque is present. There is also mild focal noncalcified plaque along the floor of the cavernous left ICA. A patent stent spans the distal cavernous right ICA to the distal supraclinoid portion. Residual filling of the paraophthalmic aneurysm by catheter angiogram is not evident. Right middle cerebral and both anterior cerebral arteries are patent. Stent markers are seen at the left M1 MCA origin and proximal M2 MCA posterior division. There is streak artifact from coiled MCA bifurcation aneurysm. Stent appears patent within this limitation. No apparent recurrent aneurysm.   Posterior circulation: Intracranial vertebral arteries patent. Basilar artery is patent. Major cerebellar artery origins are patent. Right posterior communicating artery is  present. Posterior cerebral arteries are patent.   Venous sinuses: Patent as allowed by contrast bolus timing.  Review of the MIP images confirms the above findings   IMPRESSION: No large vessel occlusion.   Intracranial right ICA flow diverting stent is patent. 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 Within this limitation, patent left M1-M2 MCA stent and no apparent recurrent aneurysm.  EKG:    02/09/2021: Sinus rhythm. Rate 77 bpm.  Recent Labs: 11/04/2020: ALT 28; Hemoglobin 13.9; Platelets 258 06/08/2021: BUN 11; Creatinine, Ser 0.99; Potassium 5.1; Sodium 141   Recent Lipid Panel    Component Value Date/Time   CHOL 131 11/04/2020 1209   TRIG 33 11/04/2020 1209   HDL 56 11/04/2020 1209   CHOLHDL 2.3 11/04/2020 1209   VLDL 7 11/04/2020 1209   LDLCALC 68 11/04/2020 1209    Physical Exam:    Wt Readings from Last 3 Encounters:  09/10/21 193 lb 9.6 oz (87.8 kg)  06/08/21 199 lb 3.2 oz (90.4 kg)  04/13/21 199 lb 9.6 oz (90.5 kg)     VS:  BP 133/87 (BP Location: Left Arm, Patient Position: Sitting, Cuff Size: Normal)   Pulse 70   Ht 5\' 11"  (1.803 m)   Wt 193 lb 9.6 oz (87.8 kg)   SpO2 98%   BMI 27.00 kg/m  , BMI Body mass index is 27 kg/m. GENERAL:  Well appearing HEENT: Pupils equal round and reactive, fundi not visualized, oral mucosa unremarkable NECK:  No jugular venous distention, waveform within normal limits, carotid upstroke brisk and symmetric, no bruits, no thyromegaly LUNGS:  Clear to auscultation bilaterally HEART:  RRR.  PMI not displaced or sustained,S1 and S2 within normal limits, no S3, no S4, no clicks, no rubs, no murmurs ABD:  Flat, positive bowel sounds normal in frequency in pitch, no bruits, no rebound, no guarding, no midline pulsatile mass, no hepatomegaly, no splenomegaly EXT:  2 plus pulses throughout, no edema, no cyanosis  no clubbing SKIN:  No rashes no nodules NEURO:  Cranial nerves II through XII grossly intact, motor grossly intact throughout PSYCH:  Cognitively intact, oriented to person place and time  ASSESSMENT:    1. Essential hypertension   2. Elevated cholesterol   3. 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 Smokes 1 pack of cigarettes per day   5. Mixed hyperlipidemia     PLAN:    Essential hypertension BP better controlled at home.  Continue valsartan.  Keep working on smoking cessation.  Increase exercise to at least 150 minutes weekly.   Smokes 1 pack of cigarettes per day Continue working on smoking cessation.  Consider patches/gum.  Did not tolerate Wellbutrin.  Mixed hyperlipidemia Continue atorvastatin.  He will come for fasting lipids/CMP.    Disposition: FU with PharmD in 2 months. FU with Braycen Burandt C. Duke Salvia, MD, [facility] in 6 months.  Medication Adjustments/Labs and Tests Ordered: Current medicines are reviewed at length with the patient today.  Concerns regarding medicines are outlined above.   Orders Placed This Encounter  Procedures   Comprehensive metabolic panel   Lipid panel     No orders of the defined types were placed in this encounter.    Patient Instructions  Medication Instructions:  Your physician recommends that you continue on your current medications as directed. Please refer to the Current Medication list given to you today.   *If you need a refill on your cardiac medications before your next appointment, please call your pharmacy*  Lab Work: FASTING LP/CMET SOON   If you have labs (blood work) drawn today and your tests are completely normal, you will receive your results only by: [employer] (if you have MyChart) OR A paper copy in the mail If you have any lab test that is abnormal or we need to change your treatment, we will call you to review the results.  Testing/Procedures: NONE  Follow-Up: At [facility], you and your health needs are our priority.  As part of our continuing mission to provide you with exceptional heart care, we have created designated Provider Care Teams.  These Care Teams include your primary Cardiologist (physician) and Advanced Practice Providers (APPs -  Physician Assistants and Nurse Practitioners) who all work together to provide you with the care you need, when you need it.  We recommend signing up for the patient portal called "MyChart".  Sign up information is provided on this After Visit Summary.  MyChart is used to connect with patients for Virtual Visits (Telemedicine).  Patients are able to view lab/test results, encounter notes, upcoming appointments, etc.  Non-urgent messages can be sent to your provider as well.   To learn more about what you can do with MyChart, go to [email or web address].    Your next appointment:   12 month(s)  The format for your next appointment:   In Person  Provider:   Skeet Latch, MD   Important Information About Sugar          Signed, Skeet Latch, MD  10/06/2021 8:57 AM    [place]

## 2021-09-10 NOTE — Patient Instructions (Signed)
Medication Instructions:  Your physician recommends that you continue on your current medications as directed. Please refer to the Current Medication list given to you today.   *If you need a refill on your cardiac medications before your next appointment, please call your pharmacy*  Lab Work: FASTING LP/CMET SOON   If you have labs (blood work) drawn today and your tests are completely normal, you will receive your results only by: MyChart Message (if you have MyChart) OR A paper copy in the mail If you have any lab test that is abnormal or we need to change your treatment, we will call you to review the results.  Testing/Procedures: NONE  Follow-Up: At [facility], you and your health needs are our priority.  As part of our continuing mission to provide you with exceptional heart care, we have created designated Provider Care Teams.  These Care Teams include your primary Cardiologist (physician) and Advanced Practice Providers (APPs -  Physician Assistants and Nurse Practitioners) who all work together to provide you with the care you need, when you need it.  We recommend signing up for the patient portal called "MyChart".  Sign up information is provided on this After Visit Summary.  MyChart is used to connect with patients for Virtual Visits (Telemedicine).  Patients are able to view lab/test results, encounter notes, upcoming appointments, etc.  Non-urgent messages can be sent to your provider as well.   To learn more about what you can do with MyChart, go to [email or web address].    Your next appointment:   12 month(s)  The format for your next appointment:   In Person  Provider:   Chilton Si, MD   Important Information About Sugar

## 2021-09-28 ENCOUNTER — Ambulatory Visit: Payer: Medicare Other | Admitting: Pharmacist

## 2021-09-28 VITALS — BP 131/78 | HR 75

## 2021-09-28 DIAGNOSIS — I10 Essential (primary) hypertension: Secondary | ICD-10-CM | POA: Diagnosis not present

## 2021-09-28 NOTE — Progress Notes (Signed)
Patient ID: Victor Pena                 DOB: 1955/10/05                      MRN: [id number]     HPI: Victor Pena is a 66 y.o. male referred by Dr. Duke Salvia to HTN clinic. PMH is significant for HTN, stroke, tobacco use, subarachnoid hemorrhage, retinal artery occlusion, and HLD.  Valsartan was started at last visit with PharmD.  At last visit, BMP showed normal lab results and patient was awaiting for his BP monitor to come in the mail.  Was continued on valsartan 80mg  daily and expressed interested in starting nicotine gum for smoking cessation.  Patient presents today for follow up.  Has been checking BP at home and reports systolic readings in 110s-120s and diastolic readings in the 70s.  Reports no adverse effects to valsartan.  Unfortunately has continued to smoke. Believes he smokes about 6-7 cigarettes a day. 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    Current HTN meds:  Valsartan 80mg  daily  BP goal: 130/80   Wt Readings from Last 3 Encounters:  09/10/21 193 lb 9.6 oz (87.8 kg)  06/08/21 199 lb 3.2 oz (90.4 kg)  04/13/21 199 lb 9.6 oz (90.5 kg)   BP Readings from Last 3 Encounters:  09/10/21 133/87  06/08/21 (!) 144/90  04/13/21 (!) 142/80   Pulse Readings from Last 3 Encounters:  09/10/21 70  06/08/21 71  04/13/21 83    Renal function: CrCl cannot be calculated (Patient's most recent lab result is older than the maximum 21 days allowed.).  Past Medical History:  Diagnosis Date   Cerebral aneurysm    Diplopia    Essential hypertension 02/09/2021   Intracranial aneurysm    Seasonal allergies    Stroke [facility])    Wears glasses     Current Outpatient Medications on File Prior to Visit  Medication Sig Dispense Refill   acetaminophen (TYLENOL) 500 MG tablet Take 1,000 mg by mouth every 6 (six) hours as needed (Back pain).     aspirin EC 81 MG tablet Take 81 mg by mouth daily. Swallow whole.     atorvastatin (LIPITOR) 40 MG tablet Take 1  tablet (40 mg total) by mouth daily. 90 tablet 3   nicotine polacrilex (NICORETTE) 4 MG gum Chew and park one piece of gum as needed for smoking cessation 100 tablet 0   valsartan (DIOVAN) 80 MG tablet Take 1 tablet (80 mg total) by mouth daily. 90 tablet 3   No current facility-administered medications on file prior to visit.    Allergies  Allergen Reactions   Other Other (See Comments)    seasonal     Assessment/Plan:  1. Hypertension -  Patient BP in room 131/78 which is much improved and only slightly above goal of <130/90. Reported home readings are at goal.   Advised for patient to continue to check BP at home and if readings trend up to let clinic know and valsartan can be increased.  Patient voiced understanding. Has follow up labs ordered from Dr Duke Salvia.  Continue valsartan 80mg  daily Refill nicotine gum Recheck as needed  Laural Golden, PharmD, BCACP, CDCES, CPP [address] [place], [place], [address] Phone: [phone], Fax: [phone]

## 2021-10-06 ENCOUNTER — Encounter (HOSPITAL_BASED_OUTPATIENT_CLINIC_OR_DEPARTMENT_OTHER): Payer: Self-pay | Admitting: Cardiovascular Disease

## 2021-10-06 NOTE — Assessment & Plan Note (Signed)
Continue working on smoking cessation.  Consider patches/gum.  Did not tolerate Wellbutrin.

## 2021-10-06 NOTE — Assessment & Plan Note (Signed)
Continue atorvastatin.  He will come for fasting lipids/CMP.

## 2021-10-06 NOTE — Assessment & Plan Note (Deleted)
BP better controlled at home.  Continue valsartan.  Keep working on smoking cessation.  Increase exercise to at least 150 minutes weekly.  

## 2021-10-06 NOTE — Assessment & Plan Note (Signed)
BP better controlled at home.  Continue valsartan.  Keep working on smoking cessation.  Increase exercise to at least 150 minutes weekly.

## 2021-11-10 ENCOUNTER — Encounter: Payer: Self-pay | Admitting: Neurology

## 2021-11-10 ENCOUNTER — Ambulatory Visit: Payer: Medicare Other | Admitting: Neurology

## 2021-11-10 VITALS — BP 127/78 | HR 77 | Ht 71.0 in | Wt 188.6 lb

## 2021-11-10 DIAGNOSIS — Z8673 Personal history of transient ischemic attack (TIA), and cerebral infarction without residual deficits: Secondary | ICD-10-CM

## 2021-11-10 DIAGNOSIS — I671 Cerebral aneurysm, nonruptured: Secondary | ICD-10-CM | POA: Diagnosis not present

## 2021-11-10 DIAGNOSIS — H3411 Central retinal artery occlusion, right eye: Secondary | ICD-10-CM

## 2021-11-10 NOTE — Progress Notes (Signed)
[facility] [address] [place]. [place] [address] [phone]       OFFICE FOLLOW-UP NOTE  [name] Date of Birth:  September 20, 1955 Medical Record Number:  [id number]   HPI: Initial visit 10/16/2019 [name] is a pleasant 66 year old Caucasian male seen today for initial office follow-up visit following hospital consultation for stroke in May 2021.  History is obtained from the patient, review of electronic medical records and I personally reviewed available imaging films in PACS.  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  He was scheduled to undergo elective treatment of the right-sided aneurysm but presented to [facility] on 08/06/2019 as a code stroke with sudden onset of transient expressive language difficulties lasting 5 minutes with recurrent episode lasting 2 minutes.  He also noticed problems using his right hand with dropping cups and lack of dexterity.  [name] also on inquiry admitted to mild left occipital region headache.  CT scan of the head showed left frontal convexity localized subarachnoid hemorrhage likely from leaking left MCA bifurcation aneurysm.  He underwent balloon assisted coiling of leaking left MCA aneurysm without incident.  Brilinta was discontinued.  He will doing well until he had a transient episode of worsening aphasia on 08/16/2019 and CT scan showed new punctate infarct in the left posterior temporal region likely related to vasospasm versus platelet aggregation at the coil mass interface given the wide mechanism.  He was restarted on Brilinta at that time.  He had follow-up diagnostic cerebral angiogram on 08/19/2019 that showed unimpeded  flow in the left MCA branches and there was a suggestion of minimum platelet aggregation at the coil mass artery interface and patient then underwent elective left MCA stent across the neck of the aneurysm.  He was discharged home and did well.  He came back subsequently on 10/02/2019 for right ICA aneurysm treatment with pipeline stent which was uneventful.  Patient states that his speech has recovered completely.  Right hand dexterity is also good.  He remains on aspirin and Brilinta and is tolerating well with only minor bruising and no bleeding.  He had serial TCD's in the hospital to watch for vasospasms which were uneventful.  EEG x2 was negative for seizures.  LDL cholesterol was elevated 149 mg percent.  IMA globin A1c was 5.2.  Patient has family history of aneurysms and his dad died of subarachnoid hemorrhage.  Patient states his blood pressure is better at home though today it is elevated in office at 151/82.  He knows he needs to quit smoking and he has cut back but is still smoking. Update 03/30/2020: He returns for follow-up after last visit 5 months ago.  He states he has no new neurological issues.  He denies any headache, focal extremity weakness numbness gait or balance problems.  He underwent diagnostic cerebral catheter angiogram for aneurysm surveillance by Dr. Corliss Skains on 03/17/2020 which shows a residual 3.2 x 2.1 mm aneurysm arising at the site of the previously treated right ICA ophthalmic aneurysm.  There is also 50% stenosis in the stent assisted aneurysm coiling of the left MCA bifurcation and is in.  Patient is currently on aspirin and Brilinta and seems to be tolerating it well  but did get easy bruising.  He climbed this ladder a few weeks ago and got some minor injury to in his thigh which have led to nasty bruise.  He states her blood pressures well controlled today it is slightly borderline in office at 140/80.  He continues to smoke but knows he needs to quit.  He has no new  complaints today. Update 01/07/2021 : He returns for follow-up after last visit 10 months ago.  Patient and unfortunately had sudden onset of painless vision loss in the right eye on 11/04/2020.  Presented to [facility] and was given IV tPA but without improvement.  Postprocedure CT scan showed small areas of subarachnoid hemorrhages over both occipital lobes.  Follow-up imaging showed stable appearance of this.  2D echo showed normal ejection fraction without cardiac source of embolism.  CT angiogram of the brain and neck showed no large vessel stenosis or occlusion.  Patent right ICA flow diverting stent with small residual right paraophthalmic artery aneurysm seen on recent catheter angiogram not seen on CTA.  Coil left MCA bifurcation aneurysm with associated artifact.  CT venogram was unremarkable.  LDL cholesterol was at goal at 68 mg percent and hemoglobin A1c was 5.5.  Patient was started on aspirin alone due to subarachnoid hemorrhage and states his vision is unfortunately not improved at all.  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  He still able to see well with the left eye and is able to drive carefully short distances.  [name] able to play golf and is back to work.  His blood pressure normally is well controlled at home though it is slightly elevated in office today at 157 ?84.  Is tolerating Lipitor well without any side effects.  Tolerating aspirin without bleeding or bruising.  No new complaints today. 11/10/2021 " he returns for follow-up after last visit 10 months ago.  He states he is doing well without any recurrent TIA or stroke symptoms.  He remains on aspirin 81 mg daily which is tolerating well without bleeding or bruising.  Remains completely blind in the right eye with no light perception.  [name] tolerating Lipitor 40 mg well without muscle aches and pains he has not had any recent follow-up lipid profile checked (primary care physician checked.  His blood pressure is  well-controlled today it is 127/78.  He has not had any follow-up CT angiogram with cerebral angiogram in more than   a year.  He has no new complaints today ROS:   14 system review of systems is positive for vision loss, minor bruising and no other complaints all systems negative  PMH:  Past Medical History:  Diagnosis Date   Cerebral aneurysm    Diplopia    Essential hypertension 02/09/2021   Intracranial aneurysm    Seasonal allergies    Stroke [facility])    Wears glasses     Social History:  Social History   Socioeconomic History   Marital status: Married    Spouse name: Not on file   Number of children: 4   Years of education: 16   Highest education level: Not on file  Occupational History   Occupation: owner   Tobacco Use   Smoking status: Some Days    Packs/day: 0.25    Types: Cigarettes   Smokeless tobacco: Never  Vaping Use   Vaping Use: Never used  Substance and Sexual Activity   Alcohol use: Yes    Alcohol/week: 10.0 standard drinks of  alcohol    Types: 10 Glasses of wine per week    Comment: one to two glasses or wine per night   Drug use: Not Currently   Sexual activity: Not on file  Other Topics Concern   Not on file  Social History Narrative   Lives at home with wife and 2 daughters   Right handed   Drinks 4-5 cups caffeine daily   Social Determinants of Health   Financial Resource Strain: Low Risk  (02/09/2021)   Overall Financial Resource Strain (CARDIA)    Difficulty of Paying Living Expenses: Not hard at all  Food Insecurity: No Food Insecurity (02/09/2021)   Hunger Vital Sign    Worried About Running Out of Food in the Last Year: Never true    Ran Out of Food in the Last Year: Never true  Transportation Needs: Not on file  Physical Activity: Inactive (02/09/2021)   Exercise Vital Sign    Days of Exercise per Week: 0 days    Minutes of Exercise per Session: 0 min  Stress: Not on file  Social Connections: Not on file  Intimate Partner  Violence: Not on file    Medications:   Current Outpatient Medications on File Prior to Visit  Medication Sig Dispense Refill   acetaminophen (TYLENOL) 500 MG tablet Take 1,000 mg by mouth every 6 (six) hours as needed (Back pain).     aspirin EC 81 MG tablet Take 81 mg by mouth daily. Swallow whole.     atorvastatin (LIPITOR) 40 MG tablet Take 1 tablet (40 mg total) by mouth daily. 90 tablet 3   valsartan (DIOVAN) 80 MG tablet Take 1 tablet (80 mg total) by mouth daily. 90 tablet 3   No current facility-administered medications on file prior to visit.    Allergies:   Allergies  Allergen Reactions   Other Other (See Comments)    seasonal    Physical Exam General: well developed, well nourished middle-aged Caucasian male, seated, in no evident distress Head: head normocephalic and atraumatic.  Neck: supple with no carotid or supraclavicular bruits Cardiovascular: regular rate and rhythm, no murmurs Musculoskeletal: no deformity Skin:  no rash/petichiae Vascular:  Normal pulses all extremities Vitals:   11/10/21 1318  BP: 127/78  Pulse: 77   Neurologic Exam Mental Status: Awake and fully alert. Oriented to place and time. Recent and remote memory intact. Attention span, concentration and fund of knowledge appropriate. Mood and affect appropriate.  Cranial Nerves: Fundoscopic exam shows optic pallor on the right disc..  Right pupil is 3 mm like reaction or perception.  Left pupil reacts normally with good vision in the left eye.Marland Kitchen Extraocular movements full without nystagmus. Visual fields full to confrontation. Hearing intact. Facial sensation intact. Face, tongue, palate moves normally and symmetrically.  Motor: Normal bulk and tone. Normal strength in all tested extremity muscles. Sensory.: intact to touch ,pinprick .position and vibratory sensation.  Coordination: Rapid alternating movements normal in all extremities. Finger-to-nose and heel-to-shin performed accurately  bilaterally. Gait and Station: Arises from chair without difficulty. Stance is normal. Gait demonstrates normal stride length and balance . Able to heel, toe and tandem walk without difficulty.  Reflexes: 1+ and symmetric. Toes downgoing.       ASSESSMENT: 66 year old Caucasian male with sentinel leak from left MCA bifurcation aneurysm in May 2021 s/p endovascular coiling followed by left MCA stent and later on interval treatment of right ICA aneurysm with pipeline embolization device in June 2021.  Initial left MCA aneurysm  treatment complicated by small left MCA branch infarcts but he is made good neurological recovery with practically no deficits.  Vascular risk factors of smoking, hyperlipidemia and borderline hypertension.  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  Negative neurovascular work-up.    PLAN: I had a long discussion with the patient regarding his history of brain aneurysm which have been treated endovascularly Recommend follow-up surveillance  CT angiogram to look for right paraophthalmic artery aneurysm growth.  Since her stroke prevention and maintain aggressive risk factor modification with control of hypertension. LDL cholesterol Goal below 70 mg check lipid profile and hemoglobin A1c.  Return for follow-up in the future in 1 year or call earlier if necessary. Greater than 50% of time during this 32 minute visit was spent on counseling,explanation of diagnosis of retinal artery occlusion, subarachnoid hemorrhage, cerebral aneurysms and strokes, planning of further management, discussion with patient and family and  coordination of care Delia Heady, MD  [facility] [address] [place], [place] [address]  Phone [phone] Fax [phone] Note: This document was prepared with digital dictation and possible smart phrase technology. Any transcriptional errors that result from this process are unintentional

## 2021-11-10 NOTE — Patient Instructions (Addendum)
I had a long discussion with the patient regarding his history of brain aneurysm which have been treated endovascularly Recommend follow-up surveillance  CT angiogram to look for right paraophthalmic artery aneurysm growth.  Since her stroke prevention and maintain aggressive risk factor modification with control of hypertension. LDL cholesterol goal below 70 mg check lipid profile and hemoglobin A1c.  Return for follow-up in the future in 1 year or call earlier if necessary.

## 2021-11-17 ENCOUNTER — Telehealth: Payer: Self-pay | Admitting: Neurology

## 2021-11-17 NOTE — Telephone Encounter (Signed)
[employer] medicare Berkley Harvey: [id number] (exp. 11/17/21 to 9/14/2) order sent to [facility], they will reach out to the patient to schedule.

## 2021-11-30 ENCOUNTER — Ambulatory Visit
Admission: RE | Admit: 2021-11-30 | Discharge: 2021-11-30 | Disposition: A | Discharge status: Home / Self Care | Payer: Medicare Other | Source: Ambulatory Visit | Attending: Neurology | Admitting: Neurology

## 2021-11-30 DIAGNOSIS — I6523 Occlusion and stenosis of bilateral carotid arteries: Secondary | ICD-10-CM | POA: Diagnosis not present

## 2021-11-30 DIAGNOSIS — I671 Cerebral aneurysm, nonruptured: Secondary | ICD-10-CM | POA: Diagnosis not present

## 2021-11-30 MED ORDER — IOPAMIDOL (ISOVUE-370) INJECTION 76%
75.0000 mL | Freq: Once | INTRAVENOUS | Status: AC | PRN
Start: 2021-11-30 — End: 2021-11-30
  Administered 2021-11-30: 75 mL via INTRAVENOUS

## 2021-12-01 ENCOUNTER — Other Ambulatory Visit (HOSPITAL_COMMUNITY): Payer: Self-pay | Admitting: Neuroradiology

## 2021-12-01 DIAGNOSIS — I671 Cerebral aneurysm, nonruptured: Secondary | ICD-10-CM

## 2021-12-15 ENCOUNTER — Telehealth: Payer: Self-pay | Admitting: Neurology

## 2021-12-15 ENCOUNTER — Ambulatory Visit (HOSPITAL_COMMUNITY)
Admission: RE | Admit: 2021-12-15 | Discharge: 2021-12-15 | Disposition: A | Discharge status: Home / Self Care | Payer: Medicare Other | Source: Ambulatory Visit | Attending: Neuroradiology | Admitting: Neuroradiology

## 2021-12-15 DIAGNOSIS — I671 Cerebral aneurysm, nonruptured: Secondary | ICD-10-CM

## 2021-12-15 NOTE — Telephone Encounter (Signed)
[name] from [facility] called stating that the pt had an MRI at the end of Aug and is still waiting for someone to call him with the results. [name] would like to know if provider would like for them to go ahead and read him the results. Please advise. [phone]

## 2021-12-15 NOTE — Progress Notes (Signed)
Referring Physician(s): de Macedo Rodrigues,Elliot Simoneaux  Chief Complaint: The patient is seen in follow up today s/p endovascular embolization of a left MCA bifurcation aneurysm and a right ICA aneurysm.  History of present illness:  Victor Pena is a 66 y.o. male with a past medical history significant for tobacco use, ruptured left MCA bifurcation aneurysm status post balloon assisted coiling followed by stent placement and unruptured right carotid ophthalmic segment aneurysm treated with a flow diverter.  He presented to ED on 11/04/2020 with sudden onset of painless right vision loss thought to be secondary to central retinal artery occlusion. He received tPA at the time. However, it needed to be reverted due to tPA related SAH. Unfortunately, he has lost the vision in his right eye. He has otherwise done well. He comes today to discuss follow-up of his treated aneurysms. I explained to him that a residual right ICA aneurysm was present on his last angiography and that, although no residual aneurysm was seen on recent CT angiogram (11/30/2021), it is hard to identify small residual aneurysms on CTA adjacent to metal, in this case, the flow diverter, but it is unlikely that the aneurysm has significantly grown. We discussed the options of repeating the angiogram to verify if the aneurysm has completely occluded or continue follow-up with MR angiogram. Pros and cons of each method were discussed.   I observed some left periorbital erythema and swelling. Victor Pena said it started several days ago after a trip to the beach. He believes it is from rubbing beach sand with secondary allergy. He said there is some crusting when he wakes up in the morning. He denies fever and said it has improved after taking zyrtec. I informed him I'd be concerned about periorbital cellulitis, but he informed me he has an appointment scheduled with his ophthalmologist.   Past Medical History:  Diagnosis Date    Cerebral aneurysm    Diplopia    Essential hypertension 02/09/2021   Intracranial aneurysm    Seasonal allergies    Stroke [facility])    Wears glasses     Past Surgical History:  Procedure Laterality Date   BACK SURGERY     COLONOSCOPY     IR 3D INDEPENDENT WKST  07/19/2019   IR 3D INDEPENDENT WKST  07/19/2019   IR 3D INDEPENDENT WKST  08/06/2019   IR 3D INDEPENDENT WKST  08/19/2019   IR 3D INDEPENDENT WKST  10/02/2019   IR ANGIO EXTERNAL CAROTID SEL EXT CAROTID BILAT MOD SED  07/19/2019   IR ANGIO INTRA EXTRACRAN SEL COM CAROTID INNOMINATE BILAT MOD SED  03/17/2020   IR ANGIO INTRA EXTRACRAN SEL INTERNAL CAROTID BILAT MOD SED  07/19/2019   IR ANGIO INTRA EXTRACRAN SEL INTERNAL CAROTID BILAT MOD SED  10/02/2019   IR ANGIO INTRA EXTRACRAN SEL INTERNAL CAROTID BILAT MOD SED  10/20/2020   IR ANGIO VERTEBRAL SEL SUBCLAVIAN INNOMINATE UNI L MOD SED  03/17/2020   IR ANGIO VERTEBRAL SEL VERTEBRAL BILAT MOD SED  07/19/2019   IR ANGIO VERTEBRAL SEL VERTEBRAL BILAT MOD SED  10/20/2020   IR ANGIO VERTEBRAL SEL VERTEBRAL UNI R MOD SED  10/02/2019   IR ANGIO VERTEBRAL SEL VERTEBRAL UNI R MOD SED  03/17/2020   IR ANGIOGRAM FOLLOW UP STUDY  08/06/2019   IR ANGIOGRAM FOLLOW UP STUDY  08/06/2019   IR ANGIOGRAM FOLLOW UP STUDY  08/06/2019   IR ANGIOGRAM FOLLOW UP STUDY  08/06/2019   IR ANGIOGRAM FOLLOW UP STUDY  08/06/2019  IR ANGIOGRAM FOLLOW UP STUDY  08/06/2019   IR ANGIOGRAM FOLLOW UP STUDY  10/02/2019   IR CT HEAD LTD  08/06/2019   IR CT HEAD LTD  08/19/2019   IR CT HEAD LTD  10/02/2019   IR INTRA CRAN STENT  08/19/2019   IR NEURO EACH ADD'L AFTER BASIC UNI LEFT (MS)  08/06/2019   IR TRANSCATH/EMBOLIZ  08/06/2019   IR TRANSCATH/EMBOLIZ  10/02/2019       IR TRANSCATH/EMBOLIZ  10/02/2019   IR US GUIDE VASC ACCESS RIGHT  07/19/2019   IR US GUIDE VASC ACCESS RIGHT  08/06/2019   IR US GUIDE VASC ACCESS RIGHT  08/19/2019   IR US GUIDE VASC ACCESS RIGHT  10/02/2019   IR US GUIDE VASC ACCESS RIGHT  03/17/2020   IR US GUIDE VASC ACCESS  RIGHT  10/20/2020   RADIOLOGY WITH ANESTHESIA N/A 08/06/2019   Procedure: IR WITH ANESTHESIA;  Surgeon: Radiologist, Medication, MD;  Location: [facility] OR;  Service: Radiology;  Laterality: N/A;   RADIOLOGY WITH ANESTHESIA N/A 08/19/2019   Procedure: STENT PLACEMENT;  Surgeon: Baldemar Lenis, MD;  Location: [facility] OR;  Service: Radiology;  Laterality: N/A;   RADIOLOGY WITH ANESTHESIA N/A 10/02/2019   Procedure: RADIOLOGY WITH ANESTHESIA  EMBOLIZATION;  Surgeon: Baldemar Lenis, MD;  Location: [facility] OR;  Service: Radiology;  Laterality: N/A;   WISDOM TOOTH EXTRACTION      Allergies: Other  Medications: Prior to Admission medications   Medication Sig Start Date End Date Taking? Authorizing Provider  acetaminophen (TYLENOL) 500 MG tablet Take 1,000 mg by mouth every 6 (six) hours as needed (Back pain).    [provider]  aspirin EC 81 MG tablet Take 81 mg by mouth daily. Swallow whole.    [provider]  atorvastatin (LIPITOR) 40 MG tablet Take 1 tablet (40 mg total) by mouth daily. 04/13/21   Chilton Si, MD  valsartan (DIOVAN) 80 MG tablet Take 1 tablet (80 mg total) by mouth daily. 04/13/21   Chilton Si, MD     Family History  Problem Relation Age of Onset   Hypertension Father    Aneurysm Father        brain    Social History   Socioeconomic History   Marital status: Married    Spouse name: Not on file   Number of children: 4   Years of education: 16   Highest education level: Not on file  Occupational History   Occupation: owner   Tobacco Use   Smoking status: Some Days    Packs/day: 0.25    Types: Cigarettes   Smokeless tobacco: Never  Vaping Use   Vaping Use: Never used  Substance and Sexual Activity   Alcohol use: Yes    Alcohol/week: 10.0 standard drinks of alcohol    Types: 10 Glasses of wine per week    Comment: one to two glasses or wine per night   Drug use: Not Currently   Sexual activity: Not on file  Other  Topics Concern   Not on file  Social History Narrative   Lives at home with wife and 2 daughters   Right handed   Drinks 4-5 cups caffeine daily   Social Determinants of Health   Financial Resource Strain: Low Risk  (02/09/2021)   Overall Financial Resource Strain (CARDIA)    Difficulty of Paying Living Expenses: Not hard at all  Food Insecurity: No Food Insecurity (02/09/2021)   Hunger Vital Sign    Worried  About Running Out of Food in the Last Year: Never true    Ran Out of Food in the Last Year: Never true  Transportation Needs: Not on file  Physical Activity: Inactive (02/09/2021)   Exercise Vital Sign    Days of Exercise per Week: 0 days    Minutes of Exercise per Session: 0 min  Stress: Not on file  Social Connections: Not on file     Vital Signs: There were no vitals taken for this visit.  Physical Exam Constitutional:      Appearance: Normal appearance.  Eyes:      Comments: Redness, swelling.  Neurological:     Mental Status: [name] alert and oriented to person, place, and time. Mental status is at baseline.     Cranial Nerves: Cranial nerve deficit present.     Sensory: No sensory deficit.     Motor: No weakness.     Gait: Gait normal.     Imaging: I reviewed the CTA of the head performed on 11/30/2021. No evidence of residual aneurysm at the treated site. No new aneurysm identified. No unexpected findings.  Labs:  CBC: No results for input(s): "WBC", "HGB", "HCT", "PLT" in the last 8760 hours.  COAGS: No results for input(s): "INR", "APTT" in the last 8760 hours.  BMP: Recent Labs    06/08/21 0859  NA 141  K 5.1  CL 103  CO2 26  GLUCOSE 82  BUN 11  CALCIUM 9.7  CREATININE 0.99    LIVER FUNCTION TESTS: No results for input(s): "BILITOT", "AST", "ALT", "ALKPHOS", "PROT", "ALBUMIN" in the last 8760 hours.  Assessment:  Victor Pena has been doing well despite unfortunate right CRAO with vision loss. We discussed options for continued  follow-up of his treated aneurysms. He would prefer to avoid additional conventional angiograms. Given the small residual size of the right ICA aneurysm and complete left MCA bifurcation aneurysm occlusion on last catheter angiogram, this is a reasonable option. We will obtain a baseline MRA of the brain now and if no significant findings are seen, a follow-up will be obtained in one year.   Signed: Pedro Earls, MD 12/15/2021, 12:57 PM    I spent a total of  25 Minutes in face to face in clinical consultation, greater than 50% of which was counseling/coordinating care for treated brain aneurysms.

## 2021-12-16 DIAGNOSIS — H01135 Eczematous dermatitis of left lower eyelid: Secondary | ICD-10-CM | POA: Diagnosis not present

## 2021-12-16 DIAGNOSIS — H01134 Eczematous dermatitis of left upper eyelid: Secondary | ICD-10-CM | POA: Diagnosis not present

## 2021-12-16 DIAGNOSIS — H10022 Other mucopurulent conjunctivitis, left eye: Secondary | ICD-10-CM | POA: Diagnosis not present

## 2021-12-16 NOTE — Telephone Encounter (Signed)
It was a CTA done at [facility] 8/29, it was listed for [facility] to read. Per Dr. Marjory Lies I called [facility] and asked them to assign someone to read the scan. They put it in as stat and results will show in his chart when done.

## 2021-12-16 NOTE — Telephone Encounter (Signed)
I don't see that any MRI has been done. Was it done in our system? If not we may need the results sent over from the imaging center

## 2021-12-21 ENCOUNTER — Other Ambulatory Visit (HOSPITAL_COMMUNITY): Payer: Self-pay | Admitting: Neuroradiology

## 2021-12-21 DIAGNOSIS — I671 Cerebral aneurysm, nonruptured: Secondary | ICD-10-CM

## 2021-12-25 NOTE — Progress Notes (Signed)
Kindly inform the patient that CT angiogram study of the brain shows that both his previously treated brain aneurysms are stable without any new or worrisome finding

## 2022-01-13 ENCOUNTER — Ambulatory Visit (HOSPITAL_COMMUNITY)
Admission: RE | Admit: 2022-01-13 | Discharge: 2022-01-13 | Disposition: A | Discharge status: Home / Self Care | Payer: Medicare Other | Source: Ambulatory Visit | Attending: Neuroradiology | Admitting: Neuroradiology

## 2022-01-13 DIAGNOSIS — I671 Cerebral aneurysm, nonruptured: Secondary | ICD-10-CM | POA: Diagnosis not present

## 2022-01-17 ENCOUNTER — Telehealth (HOSPITAL_COMMUNITY): Payer: Self-pay

## 2022-01-17 NOTE — Telephone Encounter (Signed)
Called pt regarding recent imaging, no answer, left vm. AW  

## 2022-01-17 NOTE — Telephone Encounter (Signed)
Pt agreed to f/u in 1 year with an MRA. AW  

## 2022-04-04 HISTORY — PX: KNEE SURGERY: SHX244

## 2022-04-12 ENCOUNTER — Other Ambulatory Visit: Payer: Self-pay | Admitting: Cardiovascular Disease

## 2022-04-12 NOTE — Telephone Encounter (Signed)
Rx(s) sent to pharmacy electronically.  

## 2022-04-14 ENCOUNTER — Other Ambulatory Visit: Payer: Self-pay | Admitting: Cardiovascular Disease

## 2022-04-20 DIAGNOSIS — H3411 Central retinal artery occlusion, right eye: Secondary | ICD-10-CM | POA: Diagnosis not present

## 2022-04-20 DIAGNOSIS — H01135 Eczematous dermatitis of left lower eyelid: Secondary | ICD-10-CM | POA: Diagnosis not present

## 2022-04-20 DIAGNOSIS — H2513 Age-related nuclear cataract, bilateral: Secondary | ICD-10-CM | POA: Diagnosis not present

## 2022-04-20 DIAGNOSIS — H01134 Eczematous dermatitis of left upper eyelid: Secondary | ICD-10-CM | POA: Diagnosis not present

## 2022-05-25 DIAGNOSIS — H5789 Other specified disorders of eye and adnexa: Secondary | ICD-10-CM | POA: Diagnosis not present

## 2022-10-10 DIAGNOSIS — I5189 Other ill-defined heart diseases: Secondary | ICD-10-CM | POA: Diagnosis not present

## 2022-10-10 DIAGNOSIS — Z0000 Encounter for general adult medical examination without abnormal findings: Secondary | ICD-10-CM | POA: Diagnosis not present

## 2022-10-10 DIAGNOSIS — Z125 Encounter for screening for malignant neoplasm of prostate: Secondary | ICD-10-CM | POA: Diagnosis not present

## 2022-10-10 DIAGNOSIS — E782 Mixed hyperlipidemia: Secondary | ICD-10-CM | POA: Diagnosis not present

## 2022-10-10 DIAGNOSIS — I10 Essential (primary) hypertension: Secondary | ICD-10-CM | POA: Diagnosis not present

## 2022-10-25 ENCOUNTER — Ambulatory Visit (HOSPITAL_BASED_OUTPATIENT_CLINIC_OR_DEPARTMENT_OTHER): Payer: Medicare Other | Admitting: Family

## 2022-10-25 ENCOUNTER — Telehealth (HOSPITAL_BASED_OUTPATIENT_CLINIC_OR_DEPARTMENT_OTHER): Payer: Self-pay

## 2022-10-25 ENCOUNTER — Encounter (HOSPITAL_BASED_OUTPATIENT_CLINIC_OR_DEPARTMENT_OTHER): Payer: Self-pay | Admitting: Family

## 2022-10-25 VITALS — BP 138/86 | HR 80 | Ht 71.0 in | Wt 196.6 lb

## 2022-10-25 DIAGNOSIS — Z720 Tobacco use: Secondary | ICD-10-CM | POA: Diagnosis not present

## 2022-10-25 DIAGNOSIS — I10 Essential (primary) hypertension: Secondary | ICD-10-CM

## 2022-10-25 DIAGNOSIS — E782 Mixed hyperlipidemia: Secondary | ICD-10-CM

## 2022-10-25 MED ORDER — ATORVASTATIN CALCIUM 40 MG PO TABS: 40.0000 mg | ORAL_TABLET | Freq: Every day | ORAL | 3 refills | Status: AC

## 2022-10-25 MED ORDER — VALSARTAN 80 MG PO TABS: 80.0000 mg | ORAL_TABLET | Freq: Every day | ORAL | 3 refills | Status: DC

## 2022-10-25 NOTE — Progress Notes (Signed)
Cardiology Office Note:  .   Date:  11/06/2022  ID:  Victor Pena, DOB 09-May-1955, MRN [id number] PCP: Soundra Pilon, FNP  [facility] Providers Cardiologist:  None    History of Present Illness: .   Victor Pena is a 67 y.o. male with hx of CVA, intracranial aneurysm, tobacco use, HTN, HLD.   08/2019 left MCA aneurysm stent placed. 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 Received TPA and had SAH. Did not require intervention. Echo LVEF 55-60%, gr1dd.   Presents today for follow up. Enjoys playing golf in his spare time. Presently fighting a head cold likely exacerbated by seasonal allergies which he has been treating with OTC Dayquill. Monitoring BP at home with readings 128-133.   ROS: Please see the history of present illness.    All other systems reviewed and are negative.   Studies Reviewed: Marland Kitchen   EKG Interpretation Date/Time:  Tuesday October 25 2022 14:57:50 EDT Ventricular Rate:  80 PR Interval:  156 QRS Duration:  84 QT Interval:  392 QTC Calculation: 452 R Axis:   -5  Text Interpretation: Normal sinus rhythm Normal ECG Confirmed by Gillian Shields ([id number]) on 10/25/2022 3:26:47 PM    Cardiac Studies & Procedures       ECHOCARDIOGRAM  ECHOCARDIOGRAM COMPLETE 11/05/2020  Narrative ECHOCARDIOGRAM REPORT    Patient Name:   Victor Pena Date of Exam: 11/05/2020 Medical Rec #:  [id number]      Height:       71.0 in Accession #:    [id number]     Weight:       183.0 lb Date of Birth:  [birth year]      BSA:          2.030 m Patient Age:    64 years       BP:           139/85 mmHg Patient Gender: M              HR:           62 bpm. Exam Location:  Inpatient  Procedure: 2D Echo  Indications:    stroke  History:        Patient has prior history of Echocardiogram examinations, most recent 08/07/2019. Risk Factors:Current Smoker.  Sonographer:    Delcie Roch Referring Phys: [id number] [facility]  KHALIQDINA  IMPRESSIONS   1. Left ventricular ejection fraction, by estimation, is 55 to 60%. The left ventricle has normal function. The left ventricle has no regional wall motion abnormalities. Left ventricular diastolic parameters are consistent with Grade I diastolic dysfunction (impaired relaxation). 2. Right ventricular systolic function is normal. The right ventricular size is normal. There is normal pulmonary artery systolic pressure. The estimated right ventricular systolic pressure is 28.4 mmHg. 3. The mitral valve is normal in structure. Trivial mitral valve regurgitation. No evidence of mitral stenosis. 4. The aortic valve is tricuspid. Aortic valve regurgitation is not visualized. No aortic stenosis is present. 5. The inferior vena cava is normal in size with greater than 50% respiratory variability, suggesting right atrial pressure of 3 mmHg.  FINDINGS Left Ventricle: Left ventricular ejection fraction, by estimation, is 55 to 60%. The left ventricle has normal function. The left ventricle has no regional wall motion abnormalities. The left ventricular internal cavity size was normal in size. There is no left ventricular hypertrophy. Left ventricular diastolic parameters are consistent with Grade I diastolic dysfunction (impaired relaxation).  Right Ventricle:  The right ventricular size is normal. No increase in right ventricular wall thickness. Right ventricular systolic function is normal. There is normal pulmonary artery systolic pressure. The tricuspid regurgitant velocity is 2.52 m/s, and with an assumed right atrial pressure of 3 mmHg, the estimated right ventricular systolic pressure is 28.4 mmHg.  Left Atrium: Left atrial size was normal in size.  Right Atrium: Right atrial size was normal in size.  Pericardium: There is no evidence of pericardial effusion.  Mitral Valve: The mitral valve is normal in structure. Trivial mitral valve regurgitation. No evidence of mitral  valve stenosis.  Tricuspid Valve: The tricuspid valve is normal in structure. Tricuspid valve regurgitation is trivial.  Aortic Valve: The aortic valve is tricuspid. Aortic valve regurgitation is not visualized. No aortic stenosis is present.  Pulmonic Valve: The pulmonic valve was normal in structure. Pulmonic valve regurgitation is not visualized.  Aorta: The aortic root is normal in size and structure.  Venous: The inferior vena cava is normal in size with greater than 50% respiratory variability, suggesting right atrial pressure of 3 mmHg.  IAS/Shunts: No atrial level shunt detected by color flow Doppler.   LEFT VENTRICLE PLAX 2D LVIDd:         4.60 cm  Diastology LVIDs:         3.30 cm  LV e' medial:    9.14 cm/s LV PW:         1.00 cm  LV E/e' medial:  6.4 LV IVS:        0.90 cm  LV e' lateral:   11.60 cm/s LVOT diam:     2.10 cm  LV E/e' lateral: 5.0 LV SV:         66 LV SV Index:   32 LVOT Area:     3.46 cm   RIGHT VENTRICLE             IVC RV S prime:     14.10 cm/s  IVC diam: 1.60 cm TAPSE (M-mode): 2.2 cm  LEFT ATRIUM             Index       RIGHT ATRIUM           Index LA diam:        3.20 cm 1.58 cm/m  RA Area:     15.10 cm LA Vol (A2C):   55.2 ml 27.19 ml/m RA Volume:   38.50 ml  18.96 ml/m LA Vol (A4C):   35.8 ml 17.63 ml/m LA Biplane Vol: 44.4 ml 21.87 ml/m AORTIC VALVE LVOT Vmax:   84.40 cm/s LVOT Vmean:  55.300 cm/s LVOT VTI:    0.190 m  AORTA Ao Root diam: 3.30 cm Ao Asc diam:  3.30 cm  MITRAL VALVE               TRICUSPID VALVE MV Area (PHT): 3.12 cm    TR Peak grad:   25.4 mmHg MV Decel Time: 243 msec    TR Vmax:        252.00 cm/s MV E velocity: 58.30 cm/s MV A velocity: 64.70 cm/s  SHUNTS MV E/A ratio:  0.90        Systemic VTI:  0.19 m Systemic Diam: 2.10 cm  Marca Ancona MD Electronically signed by Marca Ancona MD Signature Date/Time: 11/05/2020/6:21:10 PM    Final             Risk Assessment/Calculations:  Physical Exam:   VS:  BP 138/86   Pulse 80   Ht 5\' 11"  (1.803 m)   Wt 196 lb 9.6 oz (89.2 kg)   BMI 27.42 kg/m    Wt Readings from Last 3 Encounters:  10/25/22 196 lb 9.6 oz (89.2 kg)  11/10/21 188 lb 9.6 oz (85.5 kg)  09/10/21 193 lb 9.6 oz (87.8 kg)    GEN: Well nourished, well developed in no acute distress NECK: No JVD; No carotid bruits CARDIAC: RRR, no murmurs, rubs, gallops RESPIRATORY:  Clear to auscultation without rales, wheezing or rhonchi  ABDOMEN: Soft, non-tender, non-distended EXTREMITIES:  No edema; No deformity   ASSESSMENT AND PLAN: .    HTN -  BP at goal <130/80 by home readings. Continue current regimen. Discussed to monitor BP at home at least 2 hours after medications and sitting for 5-10 minutes.   HLD, LDL goal <70 - Has been out of Atorvastatin. Will resume. FLP/LFT in 2 months.   Tobacco use - Smoking cessation encouraged. Recommend utilization of [id number]. Offered health coaching which he will consider.        Dispo: follow up 1 year  Signed, Alver Sorrow, NP

## 2022-10-25 NOTE — Telephone Encounter (Addendum)
Left message for patient to call back    ----- Message from [facility] sent at 10/25/2022  2:35 PM EDT ----- Cholesterol levels are too high. LDL needs to be <70 and it was 137.  Please increase atorvastatin to 80mg .  Repeat lipids/CMP in 2-3 months.

## 2022-10-25 NOTE — Patient Instructions (Addendum)
Medication Instructions:   RESUME Atorvastatin 40mg  daily   *If you need a refill on your cardiac medications before your next appointment, please call your pharmacy*   Lab Work: Your physician recommends that you return for lab work in 2 months: fasting lipid panel, liver enzymes  Please return for Lab work. You may come to the...   [facility] (3rd floor) [address], [place], [place] [address]  Open: 8am-Noon and 1pm-4:30pm  Please ring the doorbell on the small table when you exit the elevator and the Lab Tech will come get you  [facility] at [facility] [address], [place], [place] [address] Open: 8am-1pm, then 2pm-4:30pm   [facility]- Please see attached locations sheet stapled to your lab work with address and hours.    If you have labs (blood work) drawn today and your tests are completely normal, you will receive your results only by: MyChart Message (if you have MyChart) OR A paper copy in the mail If you have any lab test that is abnormal or we need to change your treatment, we will call you to review the results.   Testing/Procedures: Your EKG today looked great!   Follow-Up: At [facility], you and your health needs are our priority.  As part of our continuing mission to provide you with exceptional heart care, we have created designated Provider Care Teams.  These Care Teams include your primary Cardiologist (physician) and Advanced Practice Providers (APPs -  Physician Assistants and Nurse Practitioners) who all work together to provide you with the care you need, when you need it.  We recommend signing up for the patient portal called "MyChart".  Sign up information is provided on this After Visit Summary.  MyChart is used to connect with patients for Virtual Visits (Telemedicine).  Patients are able to view lab/test results, encounter notes, upcoming appointments, etc.  Non-urgent messages can be sent  to your provider as well.   To learn more about what you can do with MyChart, go to [email or web address].    Your next appointment:   1 year(s)  Provider:   Chilton Si, MD    Other Instructions  If you would  like to participate in health coaching with Amy, simply call and let us know.   If you would like referral to PREP exercise program, simply let us know.

## 2022-11-01 DIAGNOSIS — F17210 Nicotine dependence, cigarettes, uncomplicated: Secondary | ICD-10-CM | POA: Diagnosis not present

## 2022-11-09 ENCOUNTER — Encounter: Payer: Self-pay | Admitting: Neurology

## 2022-11-09 ENCOUNTER — Ambulatory Visit: Payer: Medicare Other | Admitting: Neurology

## 2022-11-09 VITALS — BP 137/79 | HR 67 | Ht 71.0 in | Wt 193.0 lb

## 2022-11-09 DIAGNOSIS — H3411 Central retinal artery occlusion, right eye: Secondary | ICD-10-CM | POA: Diagnosis not present

## 2022-11-09 DIAGNOSIS — Z8679 Personal history of other diseases of the circulatory system: Secondary | ICD-10-CM

## 2022-11-09 DIAGNOSIS — Z8673 Personal history of transient ischemic attack (TIA), and cerebral infarction without residual deficits: Secondary | ICD-10-CM | POA: Diagnosis not present

## 2022-11-09 NOTE — Progress Notes (Signed)
[facility] [address] [place]. [place] [address] [phone]       OFFICE FOLLOW-UP NOTE  [name] Date of Birth:  [birth year] Medical Record Number:  [id number]   HPI: Initial visit 10/16/2019 Victor Pena is a pleasant 67 year old Caucasian male seen today for initial office follow-up visit following hospital consultation for stroke in May 2021.  History is obtained from the patient, review of electronic medical records and I personally reviewed available imaging films in PACS.  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  He was scheduled to undergo elective treatment of the right-sided aneurysm but presented to [facility] on 08/06/2019 as a code stroke with sudden onset of transient expressive language difficulties lasting 5 minutes with recurrent episode lasting 2 minutes.  He also noticed problems using his right hand with dropping cups and lack of dexterity.  [name] also on inquiry admitted to mild left occipital region headache.  CT scan of the head showed left frontal convexity localized subarachnoid hemorrhage likely from leaking left MCA bifurcation aneurysm.  He underwent balloon assisted coiling of leaking left MCA aneurysm without incident.  Brilinta was discontinued.  He will doing well until he had a transient episode of worsening aphasia on 08/16/2019 and CT scan showed new punctate infarct in the left posterior temporal region likely related to vasospasm versus platelet aggregation at the coil mass interface given the wide mechanism.  He was restarted on Brilinta at that time.  He had follow-up diagnostic cerebral angiogram on 08/19/2019 that showed unimpeded  flow in the left MCA branches and there was a suggestion of minimum platelet aggregation at the coil mass artery interface and patient then underwent elective left MCA stent across the neck of the aneurysm.  He was discharged home and did well.  He came back subsequently on 10/02/2019 for right ICA aneurysm treatment with pipeline stent which was uneventful.  Patient states that his speech has recovered completely.  Right hand dexterity is also good.  He remains on aspirin and Brilinta and is tolerating well with only minor bruising and no bleeding.  He had serial TCD's in the hospital to watch for vasospasms which were uneventful.  EEG x2 was negative for seizures.  LDL cholesterol was elevated 149 mg percent.  IMA globin A1c was 5.2.  Patient has family history of aneurysms and his dad died of subarachnoid hemorrhage.  Patient states his blood pressure is better at home though today it is elevated in office at 151/82.  He knows he needs to quit smoking and he has cut back but is still smoking. Update 03/30/2020: He returns for follow-up after last visit 5 months ago.  He states he has no new neurological issues.  He denies any headache, focal extremity weakness numbness gait or balance problems.  He underwent diagnostic cerebral catheter angiogram for aneurysm surveillance by Victor Pena on 03/17/2020 which shows a residual 3.2 x 2.1 mm aneurysm arising at the site of the previously treated right ICA ophthalmic aneurysm.  There is also 50% stenosis in the stent assisted aneurysm coiling of the left MCA bifurcation and is in.  Patient is currently on aspirin and Brilinta and seems to be tolerating it well  but did get easy bruising.  He climbed this ladder a few weeks ago and got some minor injury to in his thigh which have led to nasty bruise.  He states her blood pressures well controlled today it is slightly borderline in office at 140/80.  He continues to smoke but knows he needs to quit.  He has no new  complaints today. Update 01/07/2021 : He returns for follow-up after last visit 10 months ago.  Patient and unfortunately had sudden onset of painless vision loss in the right eye on 11/04/2020.  Presented to [facility] and was given IV tPA but without improvement.  Postprocedure CT scan showed small areas of subarachnoid hemorrhages over both occipital lobes.  Follow-up imaging showed stable appearance of this.  2D echo showed normal ejection fraction without cardiac source of embolism.  CT angiogram of the brain and neck showed no large vessel stenosis or occlusion.  Patent right ICA flow diverting stent with small residual right paraophthalmic artery aneurysm seen on recent catheter angiogram not seen on CTA.  Coil left MCA bifurcation aneurysm with associated artifact.  CT venogram was unremarkable.  LDL cholesterol was at goal at 68 mg percent and hemoglobin A1c was 5.5.  Patient was started on aspirin alone due to subarachnoid hemorrhage and states his vision is unfortunately not improved at all.  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  He still able to see well with the left eye and is able to drive carefully short distances.  [name] able to play golf and is back to work.  His blood pressure normally is well controlled at home though it is slightly elevated in office today at 157 ?84.  Is tolerating Lipitor well without any side effects.  Tolerating aspirin without bleeding or bruising.  No new complaints today. 11/10/2021 " he returns for follow-up after last visit 10 months ago.  He states he is doing well without any recurrent TIA or stroke symptoms.  He remains on aspirin 81 mg daily which is tolerating well without bleeding or bruising.  Remains completely blind in the right eye with no light perception.  [name] tolerating Lipitor 40 mg well without muscle aches and pains he has not had any recent follow-up lipid profile checked (primary care physician checked.  His blood pressure is  well-controlled today it is 127/78.  He has not had any follow-up CT angiogram with cerebral angiogram in more than   a year.  He has no new complaints today 11/09/2022 : He returns for follow-up after last visit a year ago.  He continues to do well.  He has not no recurrent stroke or TIA symptoms.  He remains on aspirin which is tolerating well without bruising or bleeding.  He states his blood pressure is under good control today it is 132/79.  Patient states he ran out of Lipitor about 3 months ago and was not taking it and had recent lipid profile on 10/11/2022 which showed LDL-cholesterol to be 137 mg percent.  Since then he has restarted taking Lipitor and is tolerating it well without side effects.  He had follow-up CT angiogram done on 11/30/2021 which showed Patent right intracranial ICA flow diverting stent without residual filling of the treated paraophthalmic aneurysm. Patent stent mediated coil embolization of the left MCA bifurcation aneurysm without definite residual or recurrent aneurysm.Marland Kitchen  He has no new complaints today.  He continues to have blindness in the right eye his retinal artery occlusion episode 2  years ago and has made unfortunately no recovery ROS:   14 system review of systems is positive for vision loss, minor bruising and no other complaints all systems negative  PMH:  Past Medical History:  Diagnosis Date   Cerebral aneurysm    Diplopia    Essential hypertension 02/09/2021   Intracranial aneurysm    Seasonal allergies    Stroke [facility])    Wears glasses     Social History:  Social History   Socioeconomic History   Marital status: Married    Spouse name: Not on file   Number of children: 4   Years of education: 16   Highest education level: Not on file  Occupational History   Occupation: owner   Tobacco Use   Smoking status: Some Days    Current packs/day: 0.25    Types: Cigarettes   Smokeless tobacco: Never  Vaping Use   Vaping status: Never Used  Substance  and Sexual Activity   Alcohol use: Yes    Alcohol/week: 10.0 standard drinks of alcohol    Types: 10 Glasses of wine per week    Comment: one to two glasses or wine per night   Drug use: Not Currently   Sexual activity: Not on file  Other Topics Concern   Not on file  Social History Narrative   Lives at home with wife and 2 daughters   Right handed   Drinks 4-5 cups caffeine daily   Social Determinants of Health   Financial Resource Strain: Low Risk  (02/09/2021)   Overall Financial Resource Strain (CARDIA)    Difficulty of Paying Living Expenses: Not hard at all  Food Insecurity: No Food Insecurity (02/09/2021)   Hunger Vital Sign    Worried About Running Out of Food in the Last Year: Never true    Ran Out of Food in the Last Year: Never true  Transportation Needs: Not on file  Physical Activity: Inactive (02/09/2021)   Exercise Vital Sign    Days of Exercise per Week: 0 days    Minutes of Exercise per Session: 0 min  Stress: Not on file  Social Connections: Not on file  Intimate Partner Violence: Not on file    Medications:   Current Outpatient Medications on File Prior to Visit  Medication Sig Dispense Refill   acetaminophen (TYLENOL) 500 MG tablet Take 1,000 mg by mouth every 6 (six) hours as needed (Back pain).     aspirin EC 81 MG tablet Take 81 mg by mouth daily. Swallow whole.     atorvastatin (LIPITOR) 40 MG tablet Take 1 tablet (40 mg total) by mouth daily. 90 tablet 3   valsartan (DIOVAN) 80 MG tablet Take 1 tablet (80 mg total) by mouth daily. 90 tablet 3   No current facility-administered medications on file prior to visit.    Allergies:   Allergies  Allergen Reactions   Other Other (See Comments)    seasonal    Physical Exam General: well developed, well nourished middle-aged Caucasian male, seated, in no evident distress Head: head normocephalic and atraumatic.  Neck: supple with no carotid or supraclavicular bruits Cardiovascular: regular rate and  rhythm, no murmurs Musculoskeletal: no deformity Skin:  no rash/petichiae Vascular:  Normal pulses all extremities Vitals:   11/09/22 1251  BP: 137/79  Pulse: 67   Neurologic Exam Mental Status: Awake and fully alert. Oriented to place and time. Recent and remote memory intact. Attention span, concentration and fund of knowledge appropriate. Mood and affect appropriate.  Cranial Nerves: Fundoscopic exam not done..  Right pupil is 3 mm like reaction or perception.  Left pupil reacts normally with good vision in the left eye.Marland Kitchen Extraocular movements full without nystagmus but mild exotropia right eye.blind right eye.Marland Kitchen   Hearing intact. Facial sensation intact. Face, tongue, palate moves normally and symmetrically.  Motor: Normal bulk and tone. Normal strength in all tested extremity muscles. Sensory.: intact to touch ,pinprick .position and vibratory sensation.  Coordination: Rapid alternating movements normal in all extremities. Finger-to-nose and heel-to-shin performed accurately bilaterally. Gait and Station: Arises from chair without difficulty. Stance is normal. Gait demonstrates normal stride length and balance . Able to heel, toe and tandem walk without difficulty.  Reflexes: 1+ and symmetric. Toes downgoing.       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  Initial left MCA aneurysm treatment complicated by small left MCA branch infarcts but he is made good neurological recovery with practically no deficits.  Vascular risk factors of smoking, hyperlipidemia and borderline hypertension.  Diagnostic cerebral catheter angiogram on 12/15 /2021 shows 50% restenosis in the stent assisted coiling of the left MCA aneurysm and 3.2 x 2 point by millimeter residual wide neck aneurysm  projecting inferiorly and posteriorly from the previously treated right internal carotid artery aneurysm. Follow CT angio  11/30/21 shows patent right intracranial ICA flow diverting stent without residual filling of the treated paraophthalmic aneurysm. Patent stent mediated coil embolization of the left MCA bifurcation aneurysm without definite residual or recurrent aneurysm. 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  Negative neurovascular work-up.    PLAN: I had a long discussion with the patient regarding his history of brain aneurysm which have been treated endovascularly Recommend follow-up surveillance  CT angiogram to look for right paraophthalmic artery aneurysm growth.  Since her stroke prevention and maintain aggressive risk factor modification with control of hypertension. LDL cholesterol Goal below 70 mg check lipid profile and hemoglobin A1c.  Return for follow-up in the future in 1 year or call earlier if necessary. Greater than 50% of time during this 35  minute visit was spent on counseling,explanation of diagnosis of retinal artery occlusion, subarachnoid hemorrhage, cerebral aneurysms and strokes, planning of further management, discussion with patient and family and coordination of care Delia Heady, MD  [facility] [address] [place], [place] [address]  Phone [phone] Fax [phone] Note: This document was prepared with digital dictation and possible smart phrase technology. Any transcriptional errors that result from this process are unintentional

## 2022-11-09 NOTE — Patient Instructions (Signed)
I had a long discussion with the patient regarding his history of brain aneurysm which have been treated endovascularly  and results of follow-up surveillance  CT angiogram show stable appearance.Continue aspirin for r stroke prevention and maintain aggressive risk factor modification with control of hypertension. LDL cholesterol Goal below 70 mg    Return for follow-up in the future in 1 year or call earlier if necessary.

## 2023-01-05 DIAGNOSIS — H5789 Other specified disorders of eye and adnexa: Secondary | ICD-10-CM | POA: Diagnosis not present

## 2023-01-18 ENCOUNTER — Other Ambulatory Visit (HOSPITAL_COMMUNITY): Payer: Self-pay | Admitting: Neuroradiology

## 2023-01-18 DIAGNOSIS — I671 Cerebral aneurysm, nonruptured: Secondary | ICD-10-CM

## 2023-01-18 DIAGNOSIS — H0102B Squamous blepharitis left eye, upper and lower eyelids: Secondary | ICD-10-CM | POA: Diagnosis not present

## 2023-01-18 DIAGNOSIS — H0102A Squamous blepharitis right eye, upper and lower eyelids: Secondary | ICD-10-CM | POA: Diagnosis not present

## 2023-01-18 DIAGNOSIS — H1045 Other chronic allergic conjunctivitis: Secondary | ICD-10-CM | POA: Diagnosis not present

## 2023-01-30 ENCOUNTER — Telehealth (HOSPITAL_BASED_OUTPATIENT_CLINIC_OR_DEPARTMENT_OTHER): Payer: Self-pay | Admitting: Cardiovascular Disease

## 2023-01-30 ENCOUNTER — Ambulatory Visit (HOSPITAL_COMMUNITY)
Admission: RE | Admit: 2023-01-30 | Discharge: 2023-01-30 | Disposition: A | Discharge status: Home / Self Care | Payer: Medicare Other | Source: Ambulatory Visit | Attending: Neuroradiology | Admitting: Neuroradiology

## 2023-01-30 DIAGNOSIS — I671 Cerebral aneurysm, nonruptured: Secondary | ICD-10-CM | POA: Insufficient documentation

## 2023-01-30 MED ORDER — VALSARTAN 80 MG PO TABS: 80.0000 mg | ORAL_TABLET | Freq: Every day | ORAL | 3 refills | Status: AC

## 2023-01-30 NOTE — Telephone Encounter (Signed)
*  STAT* If patient is at the pharmacy, call can be transferred to refill team.   1. Which medications need to be refilled? (please list name of each medication and dose if known) valsartan (DIOVAN) 80 MG tablet    2. Would you like to learn more about the convenience, safety, & potential cost savings by using the [facility]? N/A    3. Are you open to using the [facility] (Type [facility]. N/A   4. Which pharmacy/location (including street and city if local pharmacy) is medication to be sent to?  [id number] - [place], [place] - [address]     5. Do they need a 30 day or 90 day supply? 90 day  Patient is out of medication

## 2023-01-30 NOTE — Telephone Encounter (Signed)
Rx sent 

## 2023-01-31 DIAGNOSIS — K080 Exfoliation of teeth due to systemic causes: Secondary | ICD-10-CM | POA: Diagnosis not present

## 2023-02-08 DIAGNOSIS — K080 Exfoliation of teeth due to systemic causes: Secondary | ICD-10-CM | POA: Diagnosis not present

## 2023-02-14 DIAGNOSIS — K080 Exfoliation of teeth due to systemic causes: Secondary | ICD-10-CM | POA: Diagnosis not present

## 2023-02-15 ENCOUNTER — Telehealth (HOSPITAL_COMMUNITY): Payer: Self-pay

## 2023-02-15 NOTE — Telephone Encounter (Signed)
Pt agreed to f/u in 3 years with an mra head wo. AB

## 2023-03-08 DIAGNOSIS — M25561 Pain in right knee: Secondary | ICD-10-CM | POA: Diagnosis not present

## 2023-03-14 DIAGNOSIS — M25561 Pain in right knee: Secondary | ICD-10-CM | POA: Diagnosis not present

## 2023-03-20 DIAGNOSIS — M25561 Pain in right knee: Secondary | ICD-10-CM | POA: Diagnosis not present

## 2023-04-15 DIAGNOSIS — M25561 Pain in right knee: Secondary | ICD-10-CM | POA: Diagnosis not present

## 2023-04-19 DIAGNOSIS — M25561 Pain in right knee: Secondary | ICD-10-CM | POA: Diagnosis not present

## 2023-05-01 DIAGNOSIS — H0102A Squamous blepharitis right eye, upper and lower eyelids: Secondary | ICD-10-CM | POA: Diagnosis not present

## 2023-05-01 DIAGNOSIS — H2513 Age-related nuclear cataract, bilateral: Secondary | ICD-10-CM | POA: Diagnosis not present

## 2023-05-01 DIAGNOSIS — H3411 Central retinal artery occlusion, right eye: Secondary | ICD-10-CM | POA: Diagnosis not present

## 2023-05-01 DIAGNOSIS — H1045 Other chronic allergic conjunctivitis: Secondary | ICD-10-CM | POA: Diagnosis not present

## 2023-05-03 DIAGNOSIS — S83261D Peripheral tear of lateral meniscus, current injury, right knee, subsequent encounter: Secondary | ICD-10-CM | POA: Diagnosis not present

## 2023-05-03 DIAGNOSIS — S83271D Complex tear of lateral meniscus, current injury, right knee, subsequent encounter: Secondary | ICD-10-CM | POA: Diagnosis not present

## 2023-05-08 DIAGNOSIS — M25561 Pain in right knee: Secondary | ICD-10-CM | POA: Diagnosis not present

## 2023-05-18 DIAGNOSIS — M2341 Loose body in knee, right knee: Secondary | ICD-10-CM | POA: Diagnosis not present

## 2023-05-18 DIAGNOSIS — M94261 Chondromalacia, right knee: Secondary | ICD-10-CM | POA: Diagnosis not present

## 2023-05-18 DIAGNOSIS — G8918 Other acute postprocedural pain: Secondary | ICD-10-CM | POA: Diagnosis not present

## 2023-05-18 DIAGNOSIS — S83281A Other tear of lateral meniscus, current injury, right knee, initial encounter: Secondary | ICD-10-CM | POA: Diagnosis not present

## 2023-05-18 DIAGNOSIS — Y999 Unspecified external cause status: Secondary | ICD-10-CM | POA: Diagnosis not present

## 2023-05-18 DIAGNOSIS — S83241A Other tear of medial meniscus, current injury, right knee, initial encounter: Secondary | ICD-10-CM | POA: Diagnosis not present

## 2023-05-18 DIAGNOSIS — X58XXXA Exposure to other specified factors, initial encounter: Secondary | ICD-10-CM | POA: Diagnosis not present

## 2023-06-20 DIAGNOSIS — M25561 Pain in right knee: Secondary | ICD-10-CM | POA: Diagnosis not present

## 2023-09-20 DIAGNOSIS — K080 Exfoliation of teeth due to systemic causes: Secondary | ICD-10-CM | POA: Diagnosis not present

## 2023-09-30 ENCOUNTER — Encounter (HOSPITAL_COMMUNITY): Payer: Self-pay | Admitting: Interventional Radiology

## 2023-10-17 DIAGNOSIS — H35033 Hypertensive retinopathy, bilateral: Secondary | ICD-10-CM | POA: Diagnosis not present

## 2023-10-17 DIAGNOSIS — L989 Disorder of the skin and subcutaneous tissue, unspecified: Secondary | ICD-10-CM | POA: Diagnosis not present

## 2023-10-17 DIAGNOSIS — E782 Mixed hyperlipidemia: Secondary | ICD-10-CM | POA: Diagnosis not present

## 2023-10-17 DIAGNOSIS — I5189 Other ill-defined heart diseases: Secondary | ICD-10-CM | POA: Diagnosis not present

## 2023-10-17 DIAGNOSIS — Z0000 Encounter for general adult medical examination without abnormal findings: Secondary | ICD-10-CM | POA: Diagnosis not present

## 2023-10-17 DIAGNOSIS — I10 Essential (primary) hypertension: Secondary | ICD-10-CM | POA: Diagnosis not present

## 2023-10-17 DIAGNOSIS — Z125 Encounter for screening for malignant neoplasm of prostate: Secondary | ICD-10-CM | POA: Diagnosis not present

## 2023-11-09 ENCOUNTER — Encounter: Payer: Self-pay | Admitting: Neurology

## 2023-11-09 ENCOUNTER — Ambulatory Visit: Payer: Medicare Other | Admitting: Neurology

## 2023-11-09 VITALS — BP 137/85 | HR 77 | Ht 70.0 in | Wt 202.0 lb

## 2023-11-09 DIAGNOSIS — Z9889 Other specified postprocedural states: Secondary | ICD-10-CM | POA: Diagnosis not present

## 2023-11-09 DIAGNOSIS — Z8673 Personal history of transient ischemic attack (TIA), and cerebral infarction without residual deficits: Secondary | ICD-10-CM | POA: Diagnosis not present

## 2023-11-09 NOTE — Progress Notes (Signed)
 [facility] [address] [place]. [place] [address] [phone]       OFFICE FOLLOW-UP NOTE  [place] Date of Birth:  1955-09-23 Medical Record Number:  [id number]   HPI: Initial visit 10/16/2019 [place] is a pleasant 68 year old Caucasian male seen today for initial office follow-up visit following hospital consultation for stroke in May 2021.  History is obtained from the patient, review of electronic medical records and I personally reviewed available imaging films in PACS.  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  He was scheduled to undergo elective treatment of the right-sided aneurysm but presented to [facility] on 08/06/2019 as a code stroke with sudden onset of transient expressive language difficulties lasting 5 minutes with recurrent episode lasting 2 minutes.  He also noticed problems using his right hand with dropping cups and lack of dexterity.  [name] also on inquiry admitted to mild left occipital region headache.  CT scan of the head showed left frontal convexity localized subarachnoid hemorrhage likely from leaking left MCA bifurcation aneurysm.  He underwent balloon assisted coiling of leaking left MCA aneurysm without incident.  Brilinta  was discontinued.  He will doing well until he had a transient episode of worsening aphasia on 08/16/2019 and CT scan showed new punctate infarct in the left posterior temporal region likely related to vasospasm versus platelet aggregation at the coil mass interface given the wide mechanism.  He was restarted on Brilinta  at that time.  He had follow-up diagnostic cerebral angiogram on 08/19/2019 that showed unimpeded  flow in the left MCA branches and there was a suggestion of minimum platelet aggregation at the coil mass artery interface and patient then underwent elective left MCA stent across the neck of the aneurysm.  He was discharged home and did well.  He came back subsequently on 10/02/2019 for right ICA aneurysm treatment with pipeline stent which was uneventful.  Patient states that his speech has recovered completely.  Right hand dexterity is also good.  He remains on aspirin  and Brilinta  and is tolerating well with only minor bruising and no bleeding.  He had serial TCD's in the hospital to watch for vasospasms which were uneventful.  EEG x2 was negative for seizures.  LDL cholesterol was elevated 149 mg percent.  IMA globin A1c was 5.2.  Patient has family history of aneurysms and his dad died of subarachnoid hemorrhage.  Patient states his blood pressure is better at home though today it is elevated in office at 151/82.  He knows he needs to quit smoking and he has cut back but is still smoking. Update 03/30/2020: He returns for follow-up after last visit 5 months ago.  He states he has no new neurological issues.  He denies any headache, focal extremity weakness numbness gait or balance problems.  He underwent diagnostic cerebral catheter angiogram for aneurysm surveillance by Dr. Dolphus on 03/17/2020 which shows a residual 3.2 x 2.1 mm aneurysm arising at the site of the previously treated right ICA ophthalmic aneurysm.  There is also 50% stenosis in the stent assisted aneurysm coiling of the left MCA bifurcation and is in.  Patient is currently on aspirin  and Brilinta  and seems to be tolerating it well  but did get easy bruising.  He climbed this ladder a few weeks ago and got some minor injury to in his thigh which have led to nasty bruise.  He states her blood pressures well controlled today it is slightly borderline in office at 140/80.  He continues to smoke but knows he needs to quit.  He has no new  complaints today. Update 01/07/2021 : He returns for follow-up after last visit 10 months ago.  Patient and unfortunately had sudden onset of painless vision loss in the right eye on 11/04/2020.  Presented to [facility] and was given IV tPA but without improvement.  Postprocedure CT scan showed small areas of subarachnoid hemorrhages over both occipital lobes.  Follow-up imaging showed stable appearance of this.  2D echo showed normal ejection fraction without cardiac source of embolism.  CT angiogram of the brain and neck showed no large vessel stenosis or occlusion.  Patent right ICA flow diverting stent with small residual right paraophthalmic artery aneurysm seen on recent catheter angiogram not seen on CTA.  Coil left MCA bifurcation aneurysm with associated artifact.  CT venogram was unremarkable.  LDL cholesterol was at goal at 68 mg percent and hemoglobin A1c was 5.5.  Patient was started on aspirin  alone due to subarachnoid hemorrhage and states his vision is unfortunately not improved at all.  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  He still able to see well with the left eye and is able to drive carefully short distances.  [name] able to play golf and is back to work.  His blood pressure normally is well controlled at home though it is slightly elevated in office today at 157 ?84.  Is tolerating Lipitor well without any side effects.  Tolerating aspirin  without bleeding or bruising.  No new complaints today. 11/10/2021  he returns for follow-up after last visit 10 months ago.  He states he is doing well without any recurrent TIA or stroke symptoms.  He remains on aspirin  81 mg daily which is tolerating well without bleeding or bruising.  Remains completely blind in the right eye with no light perception.  [name] tolerating Lipitor 40 mg well without muscle aches and pains he has not had any recent follow-up lipid profile checked (primary care physician checked.  His blood pressure is  well-controlled today it is 127/78.  He has not had any follow-up CT angiogram with cerebral angiogram in more than   a year.  He has no new complaints today 11/09/2022 : He returns for follow-up after last visit a year ago.  He continues to do well.  He has not no recurrent stroke or TIA symptoms.  He remains on aspirin  which is tolerating well without bruising or bleeding.  He states his blood pressure is under good control today it is 132/79.  Patient states he ran out of Lipitor about 3 months ago and was not taking it and had recent lipid profile on 10/11/2022 which showed LDL-cholesterol to be 137 mg percent.  Since then he has restarted taking Lipitor and is tolerating it well without side effects.  He had follow-up CT angiogram done on 11/30/2021 which showed Patent right intracranial ICA flow diverting stent without residual filling of the treated paraophthalmic aneurysm. Patent stent mediated coil embolization of the left MCA bifurcation aneurysm without definite residual or recurrent aneurysm.SABRA  He has no new complaints today.  He continues to have blindness in the right eye his retinal artery occlusion episode 2  years ago and has made unfortunately no recovery Update 11/09/2023 : He returns for follow-up after last visit a year ago.  Continues to do well.  He has not had recurrent TIA or stroke symptoms.  He remains on aspirin  which he continues to tolerate well without bleeding or bruising.  His blood pressure is under excellent control and today it is 137/85 in office today.  He continues to tolerate Lipitor well and states he did have lipid profile checked by his primary care physician few months ago and it was good but I do not have those results.  He had right knee surgery for meniscal tear and still has some pain from that.  [name] otherwise no new complaints.  He did have follow-up MR angiogram of the brain done on 01/30/2023 which shows no residual aneurysms the right paraophthalmic region with there  is a patent flow diverter stent and at left MCA bifurcation where there is stent assisted coiling not any recurrence.  Patient was seen by Dr Rozelle Stallion who  suggested interval surveillance every 3 years ROS:   14 system review of systems is positive for vision loss, minor bruising and no other complaints all systems negative  PMH:  Past Medical History:  Diagnosis Date   Cerebral aneurysm    Diplopia    Essential hypertension 02/09/2021   Intracranial aneurysm    Seasonal allergies    Stroke [facility])    Wears glasses     Social History:  Social History   Socioeconomic History   Marital status: Married    Spouse name: Not on file   Number of children: 4   Years of education: 16   Highest education level: Not on file  Occupational History   Occupation: owner   Tobacco Use   Smoking status: Some Days    Current packs/day: 0.25    Types: Cigarettes   Smokeless tobacco: Never  Vaping Use   Vaping status: Never Used  Substance and Sexual Activity   Alcohol use: Yes    Alcohol/week: 10.0 standard drinks of alcohol    Types: 10 Glasses of wine per week    Comment: one to two glasses or wine per night   Drug use: Not Currently   Sexual activity: Not on file  Other Topics Concern   Not on file  Social History Narrative   Lives at home with wife and 2 daughters   Right handed   Drinks 4-5 cups caffeine daily   Social Drivers of Health   Financial Resource Strain: Low Risk  (02/09/2021)   Overall Financial Resource Strain (CARDIA)    Difficulty of Paying Living Expenses: Not hard at all  Food Insecurity: No Food Insecurity (02/09/2021)   Hunger Vital Sign    Worried About Running Out of Food in the Last Year: Never true    Ran Out of Food in the Last Year: Never true  Transportation Needs: Not on file  Physical Activity: Inactive (02/09/2021)   Exercise Vital Sign    Days of Exercise per Week: 0 days    Minutes of Exercise per Session: 0 min  Stress: Not on file   Social Connections: Not on file  Intimate Partner Violence: Not on file    Medications:   Current Outpatient Medications on File Prior to Visit  Medication Sig Dispense Refill   acetaminophen  (TYLENOL ) 500 MG tablet Take 1,000 mg by mouth every 6 (six) hours as needed (Back pain).     aspirin  EC 81  MG tablet Take 81 mg by mouth daily. Swallow whole.     atorvastatin  (LIPITOR) 40 MG tablet Take 1 tablet (40 mg total) by mouth daily. 90 tablet 3   valsartan  (DIOVAN ) 80 MG tablet Take 1 tablet (80 mg total) by mouth daily. 90 tablet 3   No current facility-administered medications on file prior to visit.    Allergies:   Allergies  Allergen Reactions   Other Other (See Comments)    seasonal    Physical Exam General: well developed, well nourished middle-aged Caucasian male, seated, in no evident distress Head: head normocephalic and atraumatic.  Neck: supple with no carotid or supraclavicular bruits Cardiovascular: regular rate and rhythm, no murmurs Musculoskeletal: no deformity Skin:  no rash/petichiae Vascular:  Normal pulses all extremities Vitals:   11/09/23 1341  BP: 137/85  Pulse: 77  SpO2: 99%   Neurologic Exam Mental Status: Awake and fully alert. Oriented to place and time. Recent and remote memory intact. Attention span, concentration and fund of knowledge appropriate. Mood and affect appropriate.  Cranial Nerves: Fundoscopic exam not done..  Right pupil is 3 mm with absent light reaction or perception.  Left pupil reacts normally with good vision in the left eye.SABRA Extraocular movements full without nystagmus but mild exotropia right eye.blind right eye.. mild exotropia right eye.  Hearing intact. Facial sensation intact. Face, tongue, palate moves normally and symmetrically.  Motor: Normal bulk and tone. Normal strength in all tested extremity muscles. Sensory.: intact to touch ,pinprick .position and vibratory sensation.  Coordination: Rapid alternating movements  normal in all extremities. Finger-to-nose and heel-to-shin performed accurately bilaterally. Gait and Station: Arises from chair without difficulty. Stance is normal. Gait demonstrates normal stride length and balance . Able to heel, toe and tandem walk with mild  difficulty.  Reflexes: 1+ and symmetric. Toes downgoing.       ASSESSMENT: 68 year old Caucasian male with sentinel leak from left MCA bifurcation aneurysm in May 2021 s/p endovascular coiling followed by left MCA stent and later on interval treatment of right ICA aneurysm with pipeline embolization device in June 2021.  Initial left MCA aneurysm treatment complicated by small left MCA branch infarcts but he is made good neurological recovery with practically no deficits.  Vascular risk factors of smoking, hyperlipidemia and borderline hypertension.  Diagnostic cerebral catheter angiogram on 12/15 /2021 shows 50% restenosis in the stent assisted coiling of the left MCA aneurysm and 3.2 x 2 point by millimeter residual wide neck aneurysm projecting inferiorly and posteriorly from the previously treated right internal carotid artery aneurysm. Follow CT angio  11/30/21 shows patent right intracranial ICA flow diverting stent without residual filling of the treated paraophthalmic aneurysm. Patent stent mediated coil embolization of the left MCA bifurcation aneurysm without definite residual or recurrent aneurysm. 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  Negative neurovascular work-up.    PLAN: I had a long discussion with the patient regarding his history of brain aneurysm which have been treated endovascularly Recommend follow-up surveillance CT angiogram to look for right paraophthalmic artery aneurysm growth now every 2-3 years only.   Continue aspirin  81 mg daily for  stroke prevention and maintain aggressive risk factor modification with control of hypertension. LDL  cholesterol Goal below 70 mg check lipid profile and hemoglobin A1c.  Return for follow-up in the future only as needed  or call earlier if necessary.    I personally spent a total of 50 minutes in  the care of the patient today including getting/reviewing separately obtained history, performing a medically appropriate exam/evaluation, counseling and educating, placing orders, referring and communicating with other health care professionals, documenting clinical information in the EHR, independently interpreting results, and coordinating care.        Eather Popp, MD  [facility] [address] [place], [place]  Phone [phone] Fax [phone] Note: This document was prepared with digital dictation and possible smart phrase technology. Any transcriptional errors that result from this process are unintentional

## 2023-11-09 NOTE — Patient Instructions (Signed)
 I had a long discussion with the patient regarding his history of brain aneurysm which have been treated endovascularly Recommend follow-up surveillance CT angiogram to look for right paraophthalmic artery aneurysm growth now every 2-3 years only.   Continue aspirin  81 mg daily for  stroke prevention and maintain aggressive risk factor modification with control of hypertension. LDL cholesterol Goal below 70 mg check lipid profile and hemoglobin A1c.  Return for follow-up in the future only as needed  or call earlier if necessary.

## 2024-01-15 ENCOUNTER — Other Ambulatory Visit (HOSPITAL_COMMUNITY): Payer: Self-pay | Admitting: Radiology

## 2024-01-15 DIAGNOSIS — L538 Other specified erythematous conditions: Secondary | ICD-10-CM | POA: Diagnosis not present

## 2024-01-15 DIAGNOSIS — D492 Neoplasm of unspecified behavior of bone, soft tissue, and skin: Secondary | ICD-10-CM | POA: Diagnosis not present

## 2024-01-15 DIAGNOSIS — I671 Cerebral aneurysm, nonruptured: Secondary | ICD-10-CM

## 2024-01-15 DIAGNOSIS — B353 Tinea pedis: Secondary | ICD-10-CM | POA: Diagnosis not present

## 2024-01-24 ENCOUNTER — Ambulatory Visit: Admitting: Neuroradiology

## 2024-02-07 ENCOUNTER — Ambulatory Visit: Admitting: Neuroradiology

## 2024-02-07 ENCOUNTER — Encounter: Payer: Self-pay | Admitting: Neuroradiology

## 2024-02-07 VITALS — BP 134/80 | HR 78 | Temp 98.3°F | Ht 71.0 in | Wt 202.6 lb

## 2024-02-07 DIAGNOSIS — Z8249 Family history of ischemic heart disease and other diseases of the circulatory system: Secondary | ICD-10-CM | POA: Diagnosis not present

## 2024-02-07 DIAGNOSIS — I671 Cerebral aneurysm, nonruptured: Secondary | ICD-10-CM | POA: Diagnosis not present

## 2024-02-07 NOTE — Progress Notes (Signed)
 Chief Complaint: Patient was seen in consultation today for  Chief Complaint  Patient presents with   New Patient (Initial Visit)    I67.1 (ICD-10-CM) - Brain aneurysm    at the request of Brake,Andrew R  Referring Physician(s): Keidrick, Murty is a 68 y.o. male seen for follow-up of brain aneurysms  Assessment and Plan Assessment & Plan History of treated right internal carotid artery aneurysm and left middle cerebral artery aneurysm, the latter presented with subarachnoid hemorrhage. Both aneurysms treated successfully with no residual on MR angiogram. Low likelihood of new aneurysms due to age, but strong family history necessitates monitoring. - Scheduled MRI in one year to monitor aneurysm status.  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  History of stroke with resolved aphasia and right hand weakness.  This occurred before aneurysm treatment.   Discussed the use of AI scribe software for clinical note transcription with the patient, who gave verbal consent to proceed.  History of Present Illness [place] is a 68 year old male with a history of right internal carotid and left middle cerebral artery aneurysms who presents for follow-up after treatment.  He has a history of a six millimeter right internal carotid artery aneurysm and a left middle cerebral artery aneurysm, both treated in 2021.  The right was treated with pipeline in the left with stent assisted coiling.  Follow-up imaging with MR angiogram on January 30, 2023 showed no residual aneurysm at either location.  Prior to the treatment of the left aneurysm, he experienced episodes of speech difficulties, described as 'talking gibberish' and being unable to state his name, which resolved after approximately 15 minutes.  The symptoms then recurred, and a CT scan demonstrated subarachnoid hemorrhage related to the left middle cerebral artery  aneurysm.  He was in the ICU for a couple of weeks.  He subsequently had treatment of the right internal carotid artery.  He has completed his course of Brilinta  and continues on 81 mg aspirin .  2 years ago he had a sudden complete loss of vision in the right eye, which has not returned.  At present he has no new neurologic symptoms.  I reviewed his echocardiogram from 11/05/2020, which demonstrated normal ejection fraction, no structural abnormality and no shunt.  I looked at his EKG from 10/25/2022 which was normal.    Medications: Prior to Admission medications   Medication Sig Start Date End Date Taking? Authorizing Provider  acetaminophen  (TYLENOL ) 500 MG tablet Take 1,000 mg by mouth every 6 (six) hours as needed (Back pain).   Yes [provider]  aspirin  EC 81 MG tablet Take 81 mg by mouth daily. Swallow whole.   Yes [provider]  atorvastatin  (LIPITOR) 40 MG tablet Take 1 tablet (40 mg total) by mouth daily. 10/25/22  Yes Walker, Caitlin S, NP  cetirizine (ZYRTEC) 10 MG tablet Take 10 mg by mouth daily.   Yes [provider]  ketoconazole (NIZORAL) 2 % cream Apply 1 Application topically. 01/15/24  Yes [provider]  valsartan  (DIOVAN ) 80 MG tablet Take 1 tablet (80 mg total) by mouth daily. 01/30/23  Yes Raford Riggs, MD     Review of Systems  Respiratory:  Negative for shortness of breath.   Cardiovascular:  Negative for chest pain.    Vital Signs:  BP 134/80   Pulse 78   Temp 98.3 F (36.8 C) (Oral)   Ht 5' 11 (  1.803 m)   Wt 202 lb 9.6 oz (91.9 kg)   SpO2 98%   BMI 28.26 kg/m   Physical Exam CHEST: Lungs clear to auscultation bilaterally, no wheezes, rhonchi, or crackles. CARDIOVASCULAR: Heart sounds normal with normal S1 and S2, no murmurs. No carotid bruit.  Imaging:  Results RADIOLOGY MR angiogram: No residual aneurysm at either location (01/30/2023)  Electronically Signed: Nancyann LULLA Burns  02/07/2024, 1:51  PM  I spent more than 45 minutes with the review of his medical history, review of his prior imaging studies, history and physical exam and consultation.

## 2024-02-26 DIAGNOSIS — K429 Umbilical hernia without obstruction or gangrene: Secondary | ICD-10-CM | POA: Diagnosis not present

## 2024-03-04 ENCOUNTER — Ambulatory Visit: Payer: Self-pay | Admitting: General Surgery

## 2024-03-04 DIAGNOSIS — K439 Ventral hernia without obstruction or gangrene: Secondary | ICD-10-CM | POA: Diagnosis not present

## 2024-05-16 ENCOUNTER — Encounter (HOSPITAL_COMMUNITY): Admission: RE | Admit: 2024-05-16

## 2024-05-21 ENCOUNTER — Ambulatory Visit (HOSPITAL_COMMUNITY): Admission: RE | Admit: 2024-05-21 | Source: Home / Self Care | Admitting: General Surgery

## 2024-05-21 ENCOUNTER — Encounter (HOSPITAL_COMMUNITY): Admission: RE | Payer: Self-pay | Source: Home / Self Care

## 2024-05-21 SURGERY — REPAIR, HERNIA, VENTRAL, ROBOT-ASSISTED
Anesthesia: General
